# Patient Record
Sex: Female | Born: 1965 | Race: Black or African American | Hispanic: No | Smoking: Never smoker
Health system: Southern US, Community
[De-identification: ages and names within clinical notes are randomized; demographics above are authoritative.]

## PROBLEM LIST (undated history)

## (undated) DIAGNOSIS — K219 Gastro-esophageal reflux disease without esophagitis: Secondary | ICD-10-CM

## (undated) DIAGNOSIS — Z87442 Personal history of urinary calculi: Secondary | ICD-10-CM

## (undated) DIAGNOSIS — K589 Irritable bowel syndrome without diarrhea: Secondary | ICD-10-CM

## (undated) DIAGNOSIS — M1990 Unspecified osteoarthritis, unspecified site: Secondary | ICD-10-CM

## (undated) DIAGNOSIS — G4730 Sleep apnea, unspecified: Secondary | ICD-10-CM

## (undated) DIAGNOSIS — R011 Cardiac murmur, unspecified: Secondary | ICD-10-CM

## (undated) DIAGNOSIS — E669 Obesity, unspecified: Secondary | ICD-10-CM

## (undated) DIAGNOSIS — N920 Excessive and frequent menstruation with regular cycle: Secondary | ICD-10-CM

## (undated) DIAGNOSIS — N72 Inflammatory disease of cervix uteri: Secondary | ICD-10-CM

## (undated) DIAGNOSIS — E119 Type 2 diabetes mellitus without complications: Secondary | ICD-10-CM

## (undated) DIAGNOSIS — I10 Essential (primary) hypertension: Secondary | ICD-10-CM

## (undated) DIAGNOSIS — E559 Vitamin D deficiency, unspecified: Secondary | ICD-10-CM

## (undated) HISTORY — DX: Vitamin D deficiency, unspecified: E55.9

## (undated) HISTORY — PX: VAGINAL HYSTERECTOMY: SUR661

## (undated) HISTORY — DX: Obesity, unspecified: E66.9

## (undated) HISTORY — DX: Excessive and frequent menstruation with regular cycle: N92.0

## (undated) HISTORY — PX: BREAST LUMPECTOMY: SHX2

## (undated) HISTORY — PX: OTHER SURGICAL HISTORY: SHX169

## (undated) HISTORY — PX: TUBAL LIGATION: SHX77

## (undated) HISTORY — DX: Essential (primary) hypertension: I10

## (undated) HISTORY — DX: Inflammatory disease of cervix uteri: N72

## (undated) HISTORY — PX: HYSTEROSCOPY WITH D & C: SHX1775

## (undated) HISTORY — PX: POLYPECTOMY: SHX149

## (undated) HISTORY — DX: Irritable bowel syndrome, unspecified: K58.9

## (undated) HISTORY — PX: ENDOMETRIAL ABLATION: SHX621

---

## 1997-06-24 ENCOUNTER — Other Ambulatory Visit: Admission: RE | Admit: 1997-06-24 | Discharge: 1997-06-24 | Payer: Self-pay

## 1998-04-08 ENCOUNTER — Other Ambulatory Visit: Admission: RE | Admit: 1998-04-08 | Discharge: 1998-04-08 | Payer: Self-pay | Admitting: Obstetrics and Gynecology

## 1999-04-10 ENCOUNTER — Encounter: Admission: RE | Admit: 1999-04-10 | Discharge: 1999-04-25 | Payer: Self-pay

## 1999-05-26 ENCOUNTER — Other Ambulatory Visit: Admission: RE | Admit: 1999-05-26 | Discharge: 1999-05-26 | Payer: Self-pay | Admitting: Obstetrics and Gynecology

## 2000-05-27 ENCOUNTER — Other Ambulatory Visit: Admission: RE | Admit: 2000-05-27 | Discharge: 2000-05-27 | Payer: Self-pay | Admitting: Obstetrics and Gynecology

## 2001-05-30 ENCOUNTER — Other Ambulatory Visit: Admission: RE | Admit: 2001-05-30 | Discharge: 2001-05-30 | Payer: Self-pay | Admitting: Obstetrics and Gynecology

## 2001-10-21 ENCOUNTER — Encounter: Payer: Self-pay | Admitting: Family Medicine

## 2001-10-21 ENCOUNTER — Encounter: Admission: RE | Admit: 2001-10-21 | Discharge: 2001-10-21 | Payer: Self-pay | Admitting: Family Medicine

## 2002-07-02 ENCOUNTER — Other Ambulatory Visit: Admission: RE | Admit: 2002-07-02 | Discharge: 2002-07-02 | Payer: Self-pay | Admitting: Obstetrics and Gynecology

## 2002-09-23 ENCOUNTER — Ambulatory Visit (HOSPITAL_COMMUNITY): Admission: RE | Admit: 2002-09-23 | Discharge: 2002-09-23 | Payer: Self-pay | Admitting: Obstetrics and Gynecology

## 2002-09-23 ENCOUNTER — Encounter (INDEPENDENT_AMBULATORY_CARE_PROVIDER_SITE_OTHER): Payer: Self-pay | Admitting: *Deleted

## 2002-10-09 ENCOUNTER — Other Ambulatory Visit: Admission: RE | Admit: 2002-10-09 | Discharge: 2002-10-09 | Payer: Self-pay | Admitting: Family Medicine

## 2003-08-18 ENCOUNTER — Other Ambulatory Visit: Admission: RE | Admit: 2003-08-18 | Discharge: 2003-08-18 | Payer: Self-pay | Admitting: Obstetrics and Gynecology

## 2003-11-11 ENCOUNTER — Encounter: Admission: RE | Admit: 2003-11-11 | Discharge: 2003-11-11 | Payer: Self-pay | Admitting: Obstetrics and Gynecology

## 2003-11-19 ENCOUNTER — Encounter: Admission: RE | Admit: 2003-11-19 | Discharge: 2003-11-19 | Payer: Self-pay | Admitting: Obstetrics and Gynecology

## 2004-08-14 ENCOUNTER — Ambulatory Visit (HOSPITAL_COMMUNITY): Admission: RE | Admit: 2004-08-14 | Discharge: 2004-08-14 | Payer: Self-pay | Admitting: Family Medicine

## 2004-11-15 ENCOUNTER — Ambulatory Visit: Payer: Self-pay | Admitting: Cardiology

## 2005-09-11 ENCOUNTER — Ambulatory Visit: Payer: Self-pay | Admitting: Internal Medicine

## 2005-10-16 ENCOUNTER — Ambulatory Visit: Payer: Self-pay | Admitting: Internal Medicine

## 2005-10-19 ENCOUNTER — Ambulatory Visit: Payer: Self-pay | Admitting: Internal Medicine

## 2005-10-19 ENCOUNTER — Encounter (INDEPENDENT_AMBULATORY_CARE_PROVIDER_SITE_OTHER): Payer: Self-pay | Admitting: Specialist

## 2005-10-19 HISTORY — PX: COLONOSCOPY: SHX174

## 2006-02-12 ENCOUNTER — Encounter (INDEPENDENT_AMBULATORY_CARE_PROVIDER_SITE_OTHER): Payer: Self-pay | Admitting: *Deleted

## 2006-02-12 ENCOUNTER — Ambulatory Visit (HOSPITAL_COMMUNITY): Admission: RE | Admit: 2006-02-12 | Discharge: 2006-02-12 | Payer: Self-pay | Admitting: Obstetrics and Gynecology

## 2006-10-28 ENCOUNTER — Ambulatory Visit: Payer: Self-pay | Admitting: Cardiology

## 2006-10-28 LAB — CONVERTED CEMR LAB
Bilirubin, Direct: 0.1 mg/dL (ref 0.0–0.3)
CO2: 27 meq/L (ref 19–32)
Cholesterol: 154 mg/dL (ref 0–200)
GFR calc Af Amer: 120 mL/min
GFR calc non Af Amer: 99 mL/min
Glucose, Bld: 84 mg/dL (ref 70–99)
HDL: 58.8 mg/dL (ref >39.0)
LDL Cholesterol: 83 mg/dL (ref 0–99)
Potassium: 4.1 meq/L (ref 3.5–5.1)
Sodium: 142 meq/L (ref 135–145)
Total CHOL/HDL Ratio: 2.6
Total Protein: 6.5 g/dL (ref 6.0–8.3)
Triglycerides: 62 mg/dL (ref 0–149)

## 2007-11-03 ENCOUNTER — Ambulatory Visit: Payer: Self-pay | Admitting: Cardiology

## 2008-05-05 ENCOUNTER — Encounter: Admission: RE | Admit: 2008-05-05 | Discharge: 2008-05-05 | Payer: Self-pay | Admitting: Obstetrics and Gynecology

## 2008-05-30 ENCOUNTER — Encounter (INDEPENDENT_AMBULATORY_CARE_PROVIDER_SITE_OTHER): Payer: Self-pay | Admitting: *Deleted

## 2008-05-30 ENCOUNTER — Encounter: Admission: RE | Admit: 2008-05-30 | Discharge: 2008-05-30 | Payer: Self-pay | Admitting: Obstetrics and Gynecology

## 2008-11-10 ENCOUNTER — Ambulatory Visit: Payer: Self-pay | Admitting: Internal Medicine

## 2008-11-10 DIAGNOSIS — R112 Nausea with vomiting, unspecified: Secondary | ICD-10-CM | POA: Insufficient documentation

## 2008-11-10 DIAGNOSIS — K589 Irritable bowel syndrome without diarrhea: Secondary | ICD-10-CM | POA: Insufficient documentation

## 2008-11-10 LAB — CONVERTED CEMR LAB
ALT: 13 units/L (ref 0–35)
Amylase: 81 units/L (ref 27–131)
CO2: 31 meq/L (ref 19–32)
Calcium: 9 mg/dL (ref 8.4–10.5)
Chloride: 107 meq/L (ref 96–112)
Creatinine, Ser: 0.7 mg/dL (ref 0.4–1.2)
Eosinophils Relative: 1.8 % (ref 0.0–5.0)
GFR calc non Af Amer: 118.1 mL/min (ref >60)
Glucose, Bld: 102 mg/dL — ABNORMAL HIGH (ref 70–99)
HCT: 38.1 % (ref 36.0–46.0)
Hemoglobin: 13.1 g/dL (ref 12.0–15.0)
Lymphocytes Relative: 29.1 % (ref 12.0–46.0)
Lymphs Abs: 1.5 10*3/uL (ref 0.7–4.0)
Monocytes Relative: 4.9 % (ref 3.0–12.0)
Neutro Abs: 3 10*3/uL (ref 1.4–7.7)
Sodium: 142 meq/L (ref 135–145)
Total Bilirubin: 0.5 mg/dL (ref 0.3–1.2)
Total Protein: 7.3 g/dL (ref 6.0–8.3)
WBC: 5 10*3/uL (ref 4.5–10.5)

## 2008-11-26 ENCOUNTER — Telehealth: Payer: Self-pay | Admitting: Internal Medicine

## 2008-11-28 DIAGNOSIS — I10 Essential (primary) hypertension: Secondary | ICD-10-CM | POA: Insufficient documentation

## 2008-12-03 ENCOUNTER — Ambulatory Visit: Payer: Self-pay | Admitting: Cardiology

## 2008-12-07 ENCOUNTER — Telehealth: Payer: Self-pay | Admitting: Cardiology

## 2008-12-14 ENCOUNTER — Telehealth: Payer: Self-pay | Admitting: Internal Medicine

## 2008-12-16 ENCOUNTER — Ambulatory Visit: Payer: Self-pay | Admitting: Gastroenterology

## 2008-12-16 ENCOUNTER — Telehealth: Payer: Self-pay | Admitting: Internal Medicine

## 2008-12-17 ENCOUNTER — Encounter: Payer: Self-pay | Admitting: Nurse Practitioner

## 2008-12-21 ENCOUNTER — Encounter: Payer: Self-pay | Admitting: Internal Medicine

## 2008-12-21 ENCOUNTER — Ambulatory Visit: Payer: Self-pay | Admitting: Internal Medicine

## 2008-12-21 HISTORY — PX: ESOPHAGOGASTRODUODENOSCOPY: SHX1529

## 2009-12-13 ENCOUNTER — Ambulatory Visit: Payer: Self-pay | Admitting: Cardiology

## 2009-12-27 ENCOUNTER — Ambulatory Visit: Payer: Self-pay | Admitting: Internal Medicine

## 2010-02-22 ENCOUNTER — Telehealth: Payer: Self-pay | Admitting: Internal Medicine

## 2010-03-03 ENCOUNTER — Ambulatory Visit (HOSPITAL_COMMUNITY)
Admission: RE | Admit: 2010-03-03 | Discharge: 2010-03-04 | Payer: Self-pay | Source: Home / Self Care | Attending: Obstetrics & Gynecology | Admitting: Obstetrics & Gynecology

## 2010-03-03 ENCOUNTER — Encounter (INDEPENDENT_AMBULATORY_CARE_PROVIDER_SITE_OTHER): Payer: Self-pay | Admitting: Obstetrics & Gynecology

## 2010-03-24 ENCOUNTER — Ambulatory Visit: Payer: Self-pay | Admitting: Internal Medicine

## 2010-04-23 LAB — CONVERTED CEMR LAB
ALT: 13 units/L (ref 0–35)
AST: 15 units/L (ref 0–37)
Alkaline Phosphatase: 60 units/L (ref 39–117)
BUN: 10 mg/dL (ref 6–23)
Bilirubin, Direct: 0 mg/dL (ref 0.0–0.3)
Calcium: 9.1 mg/dL (ref 8.4–10.5)
Cholesterol: 178 mg/dL (ref 0–200)
GFR calc non Af Amer: 118.06 mL/min (ref >60)
Glucose, Bld: 85 mg/dL (ref 70–99)
Potassium: 3.3 meq/L — ABNORMAL LOW (ref 3.5–5.1)
Total Bilirubin: 0.7 mg/dL (ref 0.3–1.2)
Total Protein: 5.1 g/dL — ABNORMAL LOW (ref 6.0–8.3)
VLDL: 18.8 mg/dL (ref 0.0–40.0)

## 2010-04-27 NOTE — Assessment & Plan Note (Signed)
Summary: 6 WEEK FU/JMS   History of Present Illness Visit Type: Follow-up Visit Primary GI MD: Stan Head MD [facility] Primary Provider: Gilmore Laroche, MD Chief Complaint: IBS follow up  History of Present Illness:   Patient has hysterectomy 3 weeks ago and since she was on pain meds she stopped her Lotronex due to constipation she had from the pain meds. She states that the constipation is better and she is no longer taking pain meds but does have to use a laxative up to  two times a day. No significant abdominal pain at this time.  Her brother was recently diagnosed with either metastatic colon cancer or carcinoid tumor at age 27.   GI Review of Systems      Denies abdominal pain, acid reflux, belching, bloating, chest pain, dysphagia with liquids, dysphagia with solids, heartburn, loss of appetite, nausea, vomiting, vomiting blood, weight loss, and  weight gain.      Reports constipation.     Denies anal fissure, black tarry stools, change in bowel habit, diarrhea, diverticulosis, fecal incontinence, heme positive stool, hemorrhoids, irritable bowel syndrome, jaundice, light color stool, liver problems, rectal bleeding, and  rectal pain.    Current Medications (verified): 1)  Prilosec 40 Mg Cpdr (Omeprazole) .... Take 2 Tablets Once Daily 2)  Hyzaar 100-25 Mg Tabs (Losartan Potassium-Hctz) .... Once Daily 3)  Lotronex 0.5 Mg Tabs (Alosetron Hcl) .Marland Kitchen.. 1  By Mouth Two Times A Day (Stopped) 4)  Multivitamins   Tabs (Multiple Vitamin) .Marland Kitchen.. 1 Tab Once Daily 5)  Klor-Con M20 20 Meq Cr-Tabs (Potassium Chloride Crys Cr) .Marland Kitchen.. 1 Once Daily 6)  Doxycycline Hyclate 50 Mg Caps (Doxycycline Hyclate) .Marland Kitchen.. 1 Cap Once Daily To Prevent Uti's 7)  Loperamide Hcl 2 Mg Caps (Loperamide Hcl) .... Take One By Mouth As Needed 8)  Dulcolax 5 Mg Tbec (Bisacodyl) .... Take One By Mouth Two Times A Day  Allergies (verified): No Known Drug Allergies  Past History:  Past Medical History: Reviewed history from  12/16/2008 and no changes required. CORONARY ARTERY DISEASE, FAMILY HX (ICD-V17.3) HYPERTENSION, UNSPECIFIED (ICD-401.9) OBESITY (ICD-278.00) IRRITABLE BOWEL SYNDROME (ICD-564.1) Kidney Stones   Past Surgical History: Tubal Ligation Polypectomy from uterus Ablation of uterus Hysterectomy - fibroids  Family History: Family History of Diabetes: Mother History of Hypertension; mother, father No FH of Colon Cancer: Coronary Artery Disease Family History of Colon Cancer: brother age 25 colon resections  Social History: Reviewed history from 12/27/2009 and no changes required. Occupation: Emergency planning/management officer, [employer] working at home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  Alcohol Use - yes-1-2 glasses wine per week Illicit Drug Use - no Patient does not get regular exercise.   Vital Signs:  Patient profile:   45 year old female Height:      61 inches Weight:      238.4 pounds BMI:     45.21 Pulse rate:   80 / minute Pulse rhythm:   regular BP sitting:   124 / 76  (left arm) Cuff size:   regular  Vitals Entered By: Harlow Mares CMA Duncan Dull) (March 24, 2010 4:04 PM)  Physical Exam  General:  obese. NAD Abdomen:  obese and nontender   Impression & Recommendations:  Problem # 1:  IRRITABLE BOWEL SYNDROME (ICD-564.1) Assessment Deteriorated Now constipated after hyeterectomy. She appropriately stopped Lotronex which we will take off her list and if diarrhea returns she will call. To add MiraLax 1-3/day now and decrease use of Dulcolax. F/U as needed.  Problem # 2:  ?  of FAMILY HX COLON CANCER (ICD-V16.0) Assessment: New brother either has carcinoid or adenocarcinoma, it seems. She will find out and let us know as if adenocarcinoma it would change timing of repeat colonoscpy  Patient Instructions: 1)  You should take Miralax 1 capful (17 grams) dissolved in at least 8 ounces water/juice and drink daily. You may titrate the dose as needed (May take up to 3 doses in a one day  period as needed). 2)  Call our office should you begin having diarrhea again and think we may need to discuss putting you back on Lotronex. 3)  Please talk to your brother to find out if he had a carcinoid of the colon or if he had colon cancer and call back to let us know as this may change how often we need to complete colonoscopies on you in the future. 4)  The medication list was reviewed and reconciled.  All changed / newly prescribed medications were explained.  A complete medication list was provided to the patient / caregiver.

## 2010-04-27 NOTE — Medication Information (Signed)
Summary: Patient Acknowledge Form/Lotronex  Patient Acknowledge Form/Lotronex   Imported By: Lester Sanderson 12/30/2009 09:39:28  _____________________________________________________________________  External Attachment:    Type:   Image     Comment:   External Document

## 2010-04-27 NOTE — Assessment & Plan Note (Signed)
Summary: yearly f/u./cy   Visit Type:  1 yr f/u Primary Arlynn Mcdermid:  Gilmore Laroche, MD  CC:  states some discomfort in chest but thinks this is due to being "out of shape".....denies any sob or edema.  History of Present Illness: Mrs. Cones comes in today for followup of her hypertension, history of palpitations, and obesity.  She's had a good year but wakes up 10 more pounds. She has no symptoms of angina or ischemia. She's had no orthopnea PND or edema.  She's very compliant with her medications. She walks on a regular basis. She does not smoke.  Her cholesterol last was 178, HDL 61, triglycerides were normal, and LDL is 98. Her hemoglobin A1c was normal at 5.7.  She's followed by primary care with Dr. Tanya Nones    Clinical Reports Reviewed:  Nuclear Study:  11/10/1998:  Impression: Stress Cardiolite. Clinically positive and electrically negative for ischemia.  Normal perfusion with artifact aas noted above. EF was 62%.  Pricilla Riffle, MD, [facility]   Current Medications (verified): 1)  Prilosec 40 Mg Cpdr (Omeprazole) .... Take 2 Tablets Once Daily 2)  Hyzaar 100-25 Mg Tabs (Losartan Potassium-Hctz) .... Once Daily 3)  Hyoscyamine Sulfate Cr 0.375 Mg Xr12h-Tab (Hyoscyamine Sulfate) .Marland Kitchen.. 1 By Mouth Bid 4)  Multivitamins   Tabs (Multiple Vitamin) .Marland Kitchen.. 1 Tab Once Daily 5)  Klor-Con M20 20 Meq Cr-Tabs (Potassium Chloride Crys Cr) .Marland Kitchen.. 1 Once Daily 6)  Lomotil 2.5-0.025 Mg Tabs (Diphenoxylate-Atropine) .... Take 1 Tab Every 4-6 Hours As Needed For Diarrhea 7)  Doxycycline Hyclate 50 Mg Caps (Doxycycline Hyclate) .Marland Kitchen.. 1 Cap Once Daily To Prevent Uti's  Allergies (verified): No Known Drug Allergies  Past History:  Past Medical History: Last updated: 12/16/2008 CORONARY ARTERY DISEASE, FAMILY HX (ICD-V17.3) HYPERTENSION, UNSPECIFIED (ICD-401.9) OBESITY (ICD-278.00) IRRITABLE BOWEL SYNDROME (ICD-564.1) Kidney Stones   Past Surgical History: Last updated: 12/16/2008 Tubal  Ligation Polypectomy from uterus Ablation of uterus  Family History: Last updated: 12/16/2008 Family History of Diabetes: Mother History of Hypertension No FH of Colon Cancer: Coronary Artery Disease  Social History: Last updated: 12/16/2008 Occupation: Emergency planning/management officer, [employer]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  Alcohol Use - yes-1-2 glasses wine per week Illicit Drug Use - no Patient does not get regular exercise.   Risk Factors: Exercise: no (12/16/2008)  Risk Factors: Smoking Status: never (11/10/2008)  Review of Systems       negative other than history of present illness  Vital Signs:  Patient profile:   45 year old female Height:      61 inches Weight:      241.8 pounds BMI:     45.85 Pulse rate:   79 / minute Pulse rhythm:   irregular BP sitting:   124 / 80  (left arm) Cuff size:   large  Vitals Entered By: Danielle Rankin, CMA (December 13, 2009 4:38 PM)  Physical Exam  General:  obese.  obese.   Head:  normocephalic and atraumatic Eyes:  glasses, otherwise normal Neck:  Neck supple, no JVD. No masses, thyromegaly or abnormal cervical nodes. Lungs:  Clear bilaterally to auscultation and percussion. Heart:  PMI poorly appreciated, soft S1-S2, no murmur or gallop, carotid upstrokes equal bilaterally without bruits Msk:  Back normal, normal gait. Muscle strength and tone normal. Pulses:  pulses normal in all 4 extremities Extremities:  No clubbing or cyanosis. Neurologic:  Alert and oriented x 3. Skin:  Intact without lesions or rashes. Psych:  Normal affect.   EKG  Procedure date:  12/13/2009  Findings:      nsinus rhythm, normal EKG  Impression & Recommendations:  Problem # 1:  HYPERTENSION, UNSPECIFIED (ICD-401.9) Assessment Improved  Her updated medication list for this problem includes:    Hyzaar 100-25 Mg Tabs (Losartan potassium-hctz) ..... Once daily  Problem # 2:  OBESITY (ICD-278.00) Assessment: Deteriorated Have advised to lose  weight to avoid comorbidities including diabetes  Problem # 3:  CORONARY ARTERY DISEASE, FAMILY HX (ICD-V17.3) Assessment: Unchanged  Orders: EKG w/ Interpretation (93000)  Patient Instructions: 1)  Your physician recommends that you schedule a follow-up appointment in: YEAR WITH DR WALL 2)  Your physician recommends that you continue on your current medications as directed. Please refer to the Current Medication list given to you today.

## 2010-04-27 NOTE — Progress Notes (Signed)
Summary: Medication  Phone Note Call from Patient Call back at Home Phone [phone]   Caller: Patient Call For: Dr. Darrick Grinder Reason for Call: Talk to Nurse Summary of Call: Has some questions about his medication Initial call taken by: Karna Christmas,  February 22, 2010 12:34 PM  Follow-up for Phone Call        Pt is having hysterectomy on 12/9 and will need to bowel cleanse w/mag. citrate and an enema.  Pt would like to know if these medications are OK to use while on Lotronex. Pt is also having some gas/bloating on Lotronex and needs to know if Gas-X is OK. Please advise. Follow-up by: Francee Piccolo CMA Duncan Dull),  February 22, 2010 5:15 PM  Additional Follow-up for Phone Call Additional follow up Details #1::        these are ok to use Additional Follow-up by: Iva Boop MD, Clementeen Graham,  February 23, 2010 2:54 PM    Additional Follow-up for Phone Call Additional follow up Details #2::    notified above medications are OK to use.  Pt voices understanding. Follow-up by: Francee Piccolo CMA Duncan Dull),  February 24, 2010 8:26 AM

## 2010-04-27 NOTE — Assessment & Plan Note (Signed)
Summary: YEARLY F-UP/YF   History of Present Illness Visit Type: Follow-up Visit Primary GI MD: Stan Head MD [facility] Primary Provider: Gilmore Laroche, MD Chief Complaint: Yearly Follow up  History of Present Illness:   45 yo AA woman with IBS. Last seen about 1 year ago. Patient complains of lower abdominal pain that wakes her out of her sleep. She states that she can not eat anything green, fried or greasy or she has diarrhea.   She is better on hyoscyamine but still has post-prandial defecation, with soft stools. Urgent usually but not incontinence. She does have some nocturnal (really 4-5 AM) cramps and urge to defecate. Pain relieved with defecation.   GI Review of Systems    Reports abdominal pain.     Location of  Abdominal pain: lower abdomen.    Denies acid reflux, belching, bloating, chest pain, dysphagia with liquids, dysphagia with solids, heartburn, loss of appetite, nausea, vomiting, vomiting blood, weight loss, and  weight gain.      Reports diarrhea.     Denies anal fissure, black tarry stools, change in bowel habit, constipation, diverticulosis, fecal incontinence, heme positive stool, hemorrhoids, irritable bowel syndrome, jaundice, light color stool, liver problems, rectal bleeding, and  rectal pain.    EGD  Procedure date:  12/21/2008  Findings:      1) Abnormal mucosa in the bulb/descending duodenum. ? duodenits (biopsies are pending) NORMAL  2) Otherwise normal examination   Colonoscopy  Procedure date:  10/19/2005  Findings:      Normal including terminal ileum and biopsies of colon.  Procedures Next Due Date:    Colonoscopy: 01/2017   Current Medications (verified): 1)  Prilosec 40 Mg Cpdr (Omeprazole) .... Take 2 Tablets Once Daily 2)  Hyzaar 100-25 Mg Tabs (Losartan Potassium-Hctz) .... Once Daily 3)  Hyoscyamine Sulfate Cr 0.375 Mg Xr12h-Tab (Hyoscyamine Sulfate) .Marland Kitchen.. 1 By Mouth Bid 4)  Multivitamins   Tabs (Multiple Vitamin) .Marland Kitchen.. 1 Tab Once  Daily 5)  Klor-Con M20 20 Meq Cr-Tabs (Potassium Chloride Crys Cr) .Marland Kitchen.. 1 Once Daily 6)  Doxycycline Hyclate 50 Mg Caps (Doxycycline Hyclate) .Marland Kitchen.. 1 Cap Once Daily To Prevent Uti's 7)  Loperamide Hcl 2 Mg Caps (Loperamide Hcl) .... Take One By Mouth As Needed  Allergies (verified): No Known Drug Allergies  Past History:  Past Medical History: Reviewed history from 12/16/2008 and no changes required. CORONARY ARTERY DISEASE, FAMILY HX (ICD-V17.3) HYPERTENSION, UNSPECIFIED (ICD-401.9) OBESITY (ICD-278.00) IRRITABLE BOWEL SYNDROME (ICD-564.1) Kidney Stones   Past Surgical History: Reviewed history from 12/16/2008 and no changes required. Tubal Ligation Polypectomy from uterus Ablation of uterus  Family History: Reviewed history from 12/16/2008 and no changes required. 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  Alcohol Use - yes-1-2 glasses wine per week Illicit Drug Use - no Patient does not get regular exercise.   Review of Systems       wight increasing, aware as not as active working from home discussed walking and increasing activity  Vital Signs:  Patient profile:   45 year old female Height:      61 inches Weight:      242.2 pounds BMI:     45.93 Pulse rate:   80 / minute Pulse rhythm:   regular BP sitting:   126 / 78  (left arm) Cuff size:   regular  Vitals Entered By: Harlow Mares  CMA Duncan Dull) (December 27, 2009 4:07 PM)  Physical Exam  General:  obese.  Lungs:  Clear throughout to auscultation. Heart:  Regular rate and rhythm; no murmurs, rubs,  or bruits. Abdomen:  obese, soft and nontender BS+   Impression & Recommendations:  Problem # 1:  IRRITABLE BOWEL SYNDROME (ICD-564.1) Assessment Improved Symptoms x years with normal colon and TI and random colon bxs 2007. EGD normal  (duodenal bxs normal). Has not had infection or inflammation documented in stool. Hyoscyamine has helped but still could be better controlled Revisited Lotronex with her - she did not obtain handouts lst year. She is beter but thinks quality of life not optimal.  Explained dx Lotronex agreement reviewed and siged.  Problem # 2:  OBESITY (ICD-278.00) Assessment: Deteriorated we did not discuss today as far as changing diet but plan to address at follow-up did discuss weight gain and exercise needs  Patient Instructions: 1)  Please pick up your medications at your pharmacy. Call before you go to see if it is ready. Sometimes we have to send them other information for it to be processed so that is why you should call. 2)  Lotronex replaces your hyoscyamine. 3)  Do not take loperamide while using Lotronex unless Dr. Nira Conn it. 4)  If you develop new constipation, new, severe abdominal pain, rectal bleeding - STOP lotronex and notify Dr. Leone Payor 5)  Please schedule a follow-up appointment in 4 to 6 weeks.  6)  IBS brochure given.  7)  The medication list was reviewed and reconciled.  All changed / newly prescribed medications were explained.  A complete medication list was provided to the patient / caregiver. Prescriptions: LOTRONEX 0.5 MG TABS (ALOSETRON HCL) 1  by mouth two times a day  #60 x 1   Entered and Authorized by:   Iva Boop MD, [facility]   Signed by:   Iva Boop MD, FACG on 12/27/2009   Method used:   Electronically to        [facility]  [address] [id number] ([facility])       [address]       [place], [place]  [address]       Ph: [phone]       Fax: [phone]   RxID:   [id number]  cc Gilmore Laroche, MD

## 2010-06-05 LAB — BASIC METABOLIC PANEL
BUN: 10 mg/dL (ref 6–23)
CO2: 27 mEq/L (ref 19–32)
CO2: 28 mEq/L (ref 19–32)
Chloride: 102 mEq/L (ref 96–112)
Chloride: 102 mEq/L (ref 96–112)
Creatinine, Ser: 0.86 mg/dL (ref 0.4–1.2)
GFR calc Af Amer: >60 mL/min (ref >60)
GFR calc non Af Amer: >60 mL/min (ref >60)
Glucose, Bld: 105 mg/dL — ABNORMAL HIGH (ref 70–99)
Glucose, Bld: 97 mg/dL (ref 70–99)
Potassium: 4.5 mEq/L (ref 3.5–5.1)
Sodium: 138 mEq/L (ref 135–145)

## 2010-06-05 LAB — CBC
HCT: 39.3 % (ref 36.0–46.0)
Hemoglobin: 11.3 g/dL — ABNORMAL LOW (ref 12.0–15.0)
Hemoglobin: 13.3 g/dL (ref 12.0–15.0)
MCH: 30 pg (ref 26.0–34.0)
MCHC: 33.8 g/dL (ref 30.0–36.0)
MCHC: 33.8 g/dL (ref 30.0–36.0)
MCV: 87.4 fL (ref 78.0–100.0)
MCV: 88.8 fL (ref 78.0–100.0)
Platelets: 226 10*3/uL (ref 150–400)
RBC: 3.75 MIL/uL — ABNORMAL LOW (ref 3.87–5.11)
RDW: 14.3 % (ref 11.5–15.5)

## 2010-06-05 LAB — SURGICAL PCR SCREEN: Staphylococcus aureus: NEGATIVE

## 2010-06-07 ENCOUNTER — Encounter (HOSPITAL_BASED_OUTPATIENT_CLINIC_OR_DEPARTMENT_OTHER)
Admission: RE | Admit: 2010-06-07 | Discharge: 2010-06-07 | Disposition: A | Discharge status: Home / Self Care | Payer: 59 | Source: Ambulatory Visit | Attending: General Surgery | Admitting: General Surgery

## 2010-06-07 LAB — BASIC METABOLIC PANEL
BUN: 12 mg/dL (ref 6–23)
Calcium: 9.3 mg/dL (ref 8.4–10.5)
Creatinine, Ser: 0.71 mg/dL (ref 0.4–1.2)
GFR calc non Af Amer: >60 mL/min (ref >60)

## 2010-06-07 LAB — CBC
MCH: 28.5 pg (ref 26.0–34.0)
MCV: 85.4 fL (ref 78.0–100.0)
Platelets: 291 10*3/uL (ref 150–400)
RDW: 13.7 % (ref 11.5–15.5)

## 2010-06-07 LAB — DIFFERENTIAL
Basophils Relative: 0 % (ref 0–1)
Eosinophils Absolute: 0.1 10*3/uL (ref 0.0–0.7)
Eosinophils Relative: 2 % (ref 0–5)
Lymphs Abs: 1.7 10*3/uL (ref 0.7–4.0)
Monocytes Relative: 8 % (ref 3–12)

## 2010-06-08 ENCOUNTER — Other Ambulatory Visit: Payer: Self-pay | Admitting: General Surgery

## 2010-06-08 ENCOUNTER — Ambulatory Visit (HOSPITAL_BASED_OUTPATIENT_CLINIC_OR_DEPARTMENT_OTHER)
Admission: RE | Admit: 2010-06-08 | Discharge: 2010-06-08 | Disposition: A | Discharge status: Home / Self Care | Payer: 59 | Source: Ambulatory Visit | Attending: General Surgery | Admitting: General Surgery

## 2010-06-08 DIAGNOSIS — Q859 Phakomatosis, unspecified: Secondary | ICD-10-CM | POA: Insufficient documentation

## 2010-06-08 DIAGNOSIS — Z01818 Encounter for other preprocedural examination: Secondary | ICD-10-CM | POA: Insufficient documentation

## 2010-06-15 NOTE — Op Note (Signed)
  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  PROCEDURE:  Left breast mass wire localization biopsy.  SURGEON:  Troy Sine. Dwain Sarna, MD  ASSISTANT:  None.  ANESTHESIA:  General.  SUPERVISING ANESTHESIOLOGIST:  Janetta Hora. Frederick, M.D.  SPECIMENS:  Left breast tissue marked with short stitch superior, long stitch lateral, and double stitch deep.  Specimen to pathology.  ESTIMATED BLOOD LOSS:  Minimal.  COMPLICATIONS:  None.  DRAINS:  None.  DISPOSITION:  Recovery room in stable condition.  INDICATIONS:  This is a 45 year old female with left breast mass who underwent a mammogram in October 2011, had a finding of pseudoangiomatous stromal hyperplasia.  She was recommended a mammogram in 6 months at that point.  She returned with left breast pain as well as a vague density.  She was then referred for a surgical evaluation following that.  I then discussed with her a wire localization biopsy.  PROCEDURE:  After informed consent was obtained, the patient was first taken to the breast center where she had a wire placed.  I had the mammograms available for my review.  She was then brought to the operating room.  She had 1 mg of intravenous cefazolin administered. Sequential compression devices were placed on lower extremities prior to induction of anesthesia.  She was then placed under general anesthesia without complication.  Her left breast was prepped and draped in standard sterile surgical  fashion.  A surgical time-out was then performed.  I infiltrated 0.25% Marcaine in the lower outer quadrant of the left breast.  I made a radial incision, then used the cautery to excise the mass in the area around it.  A Faxitron mammogram was taken.  The mass was very well circumscribed and clearly palpable.  I was close to the margins on this, but this was unnecessary to take any further margin due to the diagnosis and its appearance.  I confirmed removal of the clip as well as the lesion, this was also confirmed by the radiologist. Irrigation was performed.  Hemostasis was observed.  I closed the deep breast tissue with the 2-0 Vicryl, the dermis with a 3-0 Vicryl, the skin with 4-0 Monocryl.  Steri-Strips and a sterile dressing were placed.  She was extubated in the operating room and transferred to the recovery room in stable condition.     Juanetta Gosling, MD     MCW/MEDQ  D:  06/08/2010  T:  06/09/2010  Job:  [id number]  cc:   Broadus John T. Pamalee Leyden, MD Heide Scales Yolanda Bonine, MD  Electronically Signed by Emelia Loron MD on 06/15/2010 11:15:45 AM

## 2010-08-08 NOTE — Assessment & Plan Note (Signed)
[facility]                            CARDIOLOGY OFFICE NOTE   Patricia [name], Patricia [name]                       MRN:          [id number]  DATE:11/03/2007                            DOB:          1965/11/13    Patricia [name] returns today for further management of her hypertension,  obesity, and strong family history of coronary disease.   She had been walking on a regular basis until recently when it got too  hot.  Her weight is actually down 4 pounds.  She is having no chest pain  or angina.   Her blood pressure has been on relatively good control.  Her lipids off  of any medication showed total cholesterol 154, triglycerides is 62, HDL  58.8, LDL of 83, total cholesterol with HDL ratio of 2.6.   Her current meds are,  1. Omeprazole 40 mg a day  2. Multivitamin daily.  3. Hyzaar 100/12.5 daily.  4. Iron each day.   She denies any orthopnea, PND, or peripheral edema.   PHYSICAL EXAMINATION:  VITAL SIGNS:  Blood pressure is 132/92, her pulse  is 85 and regular, and weight is 232.  HEENT:  Unchanged.  Carotids upstrokes are equal bilaterally without  bruits, no JVD.  Thyroid is not enlarged.  Trachea is midline.  LUNGS:  Clear.  HEART:  Reveals a regular rate and rhythm.  No gallop.  ABDOMEN:  Soft.  Good bowel sounds.  EXTREMITIES:  No cyanosis, clubbing, or edema.  Pulses are intact.  NEURO:  Intact.   Her EKG is normal.   I had a long talk with Patricia [name] today.  I have encouraged her to  continue to walk 3 hours per week, keep her weight in check or move it  down, she has done.  She will have annual blood work at [facility].  I do not think her lipids, however, will be much of a  problem.  She does need to watch out for type 2 diabetes, which is  probably her greatest risk.   I will plan on seeing her back in a year.     Thomas C. Daleen Squibb, MD, [facility]  Electronically Signed    TCW/MedQ  DD: 11/03/2007  DT: 11/04/2007  Job #:  [id number]   cc:   Olena Leatherwood [facility]

## 2010-08-08 NOTE — Assessment & Plan Note (Signed)
[facility]                            CARDIOLOGY OFFICE NOTE   NAME:Patricia [name], VIANNY SCHRAEDER                       MRN:          [id number]  DATE:10/28/2006                            DOB:          01-Jun-1965    Dictation Ended At This Point.     Thomas C. Daleen Squibb, MD, [facility]  Electronically Signed    TCW/MedQ  DD: 10/28/2006  DT: 10/28/2006  Job #: [id number]

## 2010-08-08 NOTE — Assessment & Plan Note (Signed)
[facility]                            CARDIOLOGY OFFICE NOTE   Chaniyah, Jahr JAMIA HOBAN                       MRN:          [id number]  DATE:10/28/2006                            DOB:          [birth year]    Ms. Simeone comes in today for further management of her multiple cardiac  risk factors of hypertension, which seems to be under fairly good  control, obesity and family history of premature coronary artery  disease.   I saw her last on November 15, 2004.   She is not walking.  She is working two jobs.  Her weight has actually  stayed about the same since last at 229.   She has some atypical chest pain that occurs intermittently and  spontaneously.  She is not having exertional angina or ischemic  symptoms.  She is due blood work.   MEDICATIONS:  1. Cozaar 100 mg a day.  2. Omeprazole 40 mg a day.  3. Multivitamin daily.   PHYSICAL EXAMINATION:  VITAL SIGNS:  Blood pressure today is 120/84,  pulse 68 and regular, weight 223.  HEENT:  Normocephalic, atraumatic.  PERRLA, extraocular movements  intact.  Sclerae clear.  Facial asymmetry normal.  NECK:  Carotid upstrokes are equal bilaterally without bruits.  No JVD,  thyroid not enlarged.  Trachea midline.  LUNGS:  Clear.  HEART:  Poorly appreciated PMI.  She has a soft S1, S2.  ABDOMEN:  Obese, organomegaly cannot be assessed.  She has bowel sounds.  EXTREMITIES:  Without edema.  Pulses are intact.  NEUROLOGIC:  Intact.   LABORATORY DATA AND X-RAY FINDINGS:  EKG normal.   ASSESSMENT/PLAN:  I have had a nice chat with Ms. Mcgirr.  I have  strongly recommended she at least try to walk 3 hours a week.  We will  check lipids, Chem 7 and LFTs today.  Therapeutic lifestyle choices were  again emphasized, particularly if she gets older.    Thomas C. Daleen Squibb, MD, [facility]  Electronically Signed   TCW/MedQ  DD: 10/28/2006  DT: 10/28/2006  Job #: [id number]   cc:   [facility]/Dr. Toma Aran

## 2010-08-11 NOTE — H&P (Signed)
NAME:  Patricia [name], CODERRE NO.:  [id number]   MEDICAL RECORD NO.:  [id number]                   PATIENT TYPE:   LOCATION:  SDC                                  FACILITY:  [facility]   PHYSICIAN:  Maxie Better, M.D.            DATE OF BIRTH:  12-Aug-1965   DATE OF ADMISSION:  DATE OF DISCHARGE:                                HISTORY & PHYSICAL   SCHEDULED PROCEDURE DATE:  September 23, 2002.   CHIEF COMPLAINT:  Heavy menses, endometrial mass.   HISTORY OF PRESENT ILLNESS:  This is a 45 year old gravida 2, para 2 married  black female with history of tubal ligation, last menstrual period was Aug 13, 2002, who is now being admitted for D and C hysteroscopic resection of  endometrial mass, secondary to menorrhagia.  The patient has had ambulatory  pain.  She complains of cycles lasting 7 days with a heavy flow.  Evaluation  included sonohysterogram that revealed a hyperechoic mass on the anterior  wall, measuring 2.1 x 6.0 cm.  It is unclear whether this is an endometrial  polyp versus submucosal fibroid.  Her ultrasound in April 14, 2002  revealed multiple uterine fibroids, normal ovaries at that time.   PAST MEDICAL HISTORY:   ALLERGIES:  NO KNOWN DRUG ALLERGIES.   MEDICATIONS:  Supplementation of calcium.   MEDICAL HISTORY:  Negative.   SURGICAL HISTORY:  1. C-section.  2. Tubal ligation.   OB HISTORY:  Cesarean section.  Tubal ligation.  Vaginal delivery.   FAMILY HISTORY:  No ovarian or colon cancer.  First cousin with breast  cancer.   SOCIAL HISTORY:  Married with 2 children, works at [employer].   REVIEW OF SYSTEMS:  Notable for some pain with deep breaths, otherwise, as  noted in the HISTORY OF PRESENT ILLNESS.   PHYSICAL EXAMINATION:  Well-developed, well-nourished black female in no  acute distress.  VITAL SIGNS:  Blood pressure 112/80.  Weight 222 lb.  Pulse 64.  HEENT:  The patient has no lesions.  Anicteric sclerae.  Pink  conjunctivae.  Oropharynx negative.  HEART: Regular rate and rhythm.  LUNGS:  Clear to auscultation.  BREASTS:  Soft, nontender, no palpable mass.  ABDOMEN:  Soft.  Nontender.  Healed transverse scar.  PELVIC EXAM:  Vulva showed no lesions.  Vagina had no discharge.  Cervix has  a round os.  Uterus is anteverted, about 10 weeks size.  Adnexa nontender.  No palpable mass.   IMPRESSION:  1. Menorrhagia.  2. Endometrial mass.  3. Uterine fibroids.   PLAN:  1. D and C.  2. Hysteroscopy.  3. Removal endometrial polyp or resection of submucosal fibroids if present.   Procedure reviewed at length with the patient.  Risks discussed including,  but no limited to, infection, bleeding, uterine perforation and its  management, fluid overload and its prevention and management, inability to  complete the procedure with the  first past with surgery, injury to  surrounding organs, structures, femoral injury.  All questions answered.  Antibiotic prophylaxis.                                                Maxie Better, M.D.    /MEDQ  D:  09/17/2002  T:  09/17/2002  Job:  [id number]

## 2010-08-11 NOTE — Op Note (Signed)
NAMEMarland Kitchen  [name], Patricia NO.:  [id number]   MEDICAL RECORD NO.:  [id number]          PATIENT TYPE:  AMB   LOCATION:  SDC                           [facility]ILITY:  [facility]   PHYSICIAN:  Maxie Better, M.D.DATE OF BIRTH:  [birth year]   DATE OF PROCEDURE:  02/12/2006  DATE OF DISCHARGE:                               OPERATIVE REPORT   PREOPERATIVE DIAGNOSIS:  Menorrhagia.   PROCEDURE:  Diagnostic hysteroscopy, dilation and curettage, endometrial  ablation with Novasure.   POSTOPERATIVE DIAGNOSIS:  Menorrhagia.   ANESTHESIA:  General, paracervical block.   SURGEON:  Maxie Better, M.D.   PROCEDURE:  Under adequate general anesthesia, the patient was placed in  the dorsal lithotomy position.  She was sterilely prepped and draped in  usual fashion.  The bladder was catheterized for small amount of urine.  Examination under anesthesia revealed anteverted, slightly bulky uterus.  No adnexal masses could be appreciated.  A bivalve speculum was placed  in the vagina.  10 mL of 1% Nesacaine was injected paracervically at 3  and 9 o'clock.  The cervix was parous.  The anterior lip of the cervix  was grasped with a single-tooth tenaculum.  The uterus was sounded to  initially 9 cm. The exocervical length was found to be 4.5 cm.  The  cervix was not dilated and a diagnostic hysteroscope was introduced into  the uterine cavity.  Both tubal ostia could be seen.  No lesions were  noted.  Slight thickening of the anterior wall of the uterus was noted.  No polypoid lesions were noted.  The hysteroscope was removed.  The  cavity was then curretted for moderate amount of tissue. Specimen  labelled endometrial curretting was sent to pathology. The cervix  internal os was dilated with a #8 dilator after resounding which  revealed a sounding length of 10 cm, a cervical of 4.5 cm giving a  cavity length of 5.5 cm. With the Novasure representative in the room,  the Novasure was placed  and applied as per the pharmaceutical direction  resulting in the cavity being ablated.  At the end of the procedure, the  Novasure was removed. The cavity width was 3.3.  The power was 100, time  was one minute and 20 seconds.  The hysteroscope was then reinserted and  charred endometrium was noted, less so of the fundal area; however, the  Novasure was felt to have been applied to the fundus.  The procedure was  then terminated by removing all instruments.  Specimen was the  endometrial curetting done initially after the hysteroscope.  Complication was none.  The patient tolerated the procedure well.  Fluid  deficit was 5 mL.  The patient was transferred to recovery in stable  condition.      Maxie Better, M.D.  Electronically Signed     [place]/MEDQ  D:  02/12/2006  T:  02/13/2006  Job:  [id number]

## 2010-08-11 NOTE — Op Note (Signed)
NAME:  Patricia [name], Patricia [name]                          ACCOUNT NO.:  [id number]   MEDICAL RECORD NO.:  [id number]                   PATIENT TYPE:  AMB   LOCATION:  SDC                                  [facility]   PHYSICIAN:  Maxie Better, M.D.            DATE OF BIRTH:  [birth year]   DATE OF PROCEDURE:  09/23/2002  DATE OF DISCHARGE:                                 OPERATIVE REPORT   PREOPERATIVE DIAGNOSIS:  Menorrhagia, endometrial mass.   POSTOPERATIVE DIAGNOSIS:  Menorrhagia, endometrial polyp.   PROCEDURE:  Diagnostic hysteroscopy, resection of endometrial polyp,  dilation and curettage.   ANESTHESIA:  General.   SURGEON:  Maxie Better, M.D.   INDICATIONS FOR PROCEDURE:  45 year old gravida 2, para 2, female with  menorrhagia who, on evaluation, was found to have endometrial mass and who  now presents for further evaluation.  The risks and benefits of the  procedure have been explained to the patient, consent was signed, and the  patient was transferred to the operating room.  The patient had had  laminaria placed on September 22, 2002, in order to facilitate her cervix for  surgery.  Therefore, she was given intravenous  antibiotics en route to the  operating room.   PROCEDURE:  Under adequate general anesthesia, the patient was placed in the  dorsal lithotomy position.  Examination under anesthesia revealed a  laminaria that was partially outside the cervical os.  The uterus was  anteverted, bulky, no adnexal masses could be appreciated.  The laminaria  was removed.  The patient was sterilely prepped and draped in the usual  fashion.  The bladder was catheterized for a small amount of urine.  The  bivalve speculum was placed in the vagina.  A single tooth tenaculum was  placed on the anterior lip of the cervix.  The cervix easily accepted the  #31 [facility] dilator.  A glycine primed resectoscope was introduced into the  uterine cavity without incident.  Thickened  endometrium was noted.  Both  tubal ostia could be seen.  Near the lower uterine segment, there was a  raised polypoid lesion anterior and posteriorly, both which was resected  using the loop.  The resectoscope was removed, the cavity was then curetted,  the resectoscope reinserted and additional resection was then performed.  When the area appeared to be leveled consistent with removal of the lesions,  the resectoscope was removed.  The cavity was then curetted.  All  instruments were removed from the vagina.  Specimen labeled endometrial  curettings with endometrial polyp was sent to pathology.  Estimated blood  loss was minimal.  Fluid deficit was 270.  Complications were none.  The  patient tolerated the procedure well and was transferred to the recovery  room in stable condition.  Maxie Better, M.D.    [place]/MEDQ  D:  09/23/2002  T:  09/23/2002  Job:  [id number]

## 2010-11-28 ENCOUNTER — Other Ambulatory Visit: Payer: Self-pay | Admitting: Cardiology

## 2011-07-13 ENCOUNTER — Other Ambulatory Visit: Payer: Self-pay | Admitting: Cardiology

## 2011-07-13 NOTE — Telephone Encounter (Signed)
..   Requested Prescriptions   Pending Prescriptions Disp Refills  . KLOR-CON M20 20 MEQ tablet [Pharmacy Med Name: KLOR-CON M20 TABLET] 30 tablet 3    Sig: TAKE 1 TABLET EVERY DAY

## 2011-08-22 ENCOUNTER — Other Ambulatory Visit: Payer: Self-pay | Admitting: Family Medicine

## 2011-08-22 DIAGNOSIS — M545 Low back pain, unspecified: Secondary | ICD-10-CM

## 2011-08-29 ENCOUNTER — Ambulatory Visit
Admission: RE | Admit: 2011-08-29 | Discharge: 2011-08-29 | Disposition: A | Discharge status: Home / Self Care | Payer: 59 | Source: Ambulatory Visit | Attending: Family Medicine | Admitting: Family Medicine

## 2011-08-29 DIAGNOSIS — M545 Low back pain, unspecified: Secondary | ICD-10-CM

## 2011-10-26 ENCOUNTER — Ambulatory Visit (INDEPENDENT_AMBULATORY_CARE_PROVIDER_SITE_OTHER): Payer: 59 | Admitting: Internal Medicine

## 2011-10-26 ENCOUNTER — Encounter: Payer: Self-pay | Admitting: Internal Medicine

## 2011-10-26 VITALS — BP 116/70 | HR 64 | Ht 62.5 in | Wt 253.5 lb

## 2011-10-26 DIAGNOSIS — E66813 Obesity, class 3: Secondary | ICD-10-CM

## 2011-10-26 DIAGNOSIS — K589 Irritable bowel syndrome without diarrhea: Secondary | ICD-10-CM

## 2011-10-26 MED ORDER — HYOSCYAMINE SULFATE ER 0.375 MG PO TB12: 0.3750 mg | ORAL_TABLET | Freq: Two times a day (BID) | ORAL | Status: DC | PRN

## 2011-10-26 NOTE — Patient Instructions (Addendum)
We have sent the following medications to your pharmacy for you to pick up at your convenience: Generic Levbid  This Rx is for IBS, give it several weeks to a month to notice a change.  If no help call us back.  If it does help we can refill it for you.  Contact [facility] at [phone] in regards to their Bariatric seminars information.  They are located at [address] , [address]   [place] [address]  Thank you for choosing me and [facility].  Iva Boop, M.D., [facility]

## 2011-10-26 NOTE — Progress Notes (Signed)
Subjective:    Patient ID: Patricia [name], female    DOB: Oct 05, 1965, 46 y.o.   MRN: [id number]  HPI This is a very pleasant 46 year old African American woman who presents with complaints of lower abdominal cramping. I know her in the past because of a history of IBS. She had diarrhea problems in IBS and responded well to Lotronex after not responding to anti-spasmodic. She then developed constipation on the Lotronex in the setting of a recent hysterectomy and narcotic use. She stopped that and has really not been on any therapy for some time. Over the past several months she's had cramps and pain in the right lower and left lower quadrant, usually for or during defecation. She moves her bowels about 3 times a day formed stools no diarrhea and no constipation. There is no rectal bleeding or hematochezia.  She's had back pain problems and MRI of the thoracic and lumbosacral spine has failed to show any significant lesions. She saw urology about lower, pain is well, CT scan was unrevealing she tells me.  No Known Allergies Current outpatient prescriptions:KLOR-CON M20 20 MEQ tablet, TAKE 1 TABLET EVERY DAY, Disp: 30 tablet, Rfl: 3;  Losartan Potassium-HCTZ (HYZAAR PO), Take 1 tablet by mouth daily., Disp: , Rfl: ;  omeprazole (PRILOSEC) 40 MG capsule, Take 40 mg by mouth 2 (two) times daily.,  Past Medical History  Diagnosis Date  . Kidney stones   . IBS (irritable bowel syndrome)   . Obesity   . Hypertension   . Chronic cervicitis   . Menorrhagia    Past Surgical History  Procedure Date  . Uterine ablation   . Polypectomy     uterus  . Tubal ligation   . Vaginal hysterectomy     fibroids  . Esophagogastroduodenoscopy 12/21/2008  . Colonoscopy 10/19/2005  . Cesarean section   . Hysteroscopy w/d&c     resection of endometrial polyp  . Breast lumpectomy     left, benign hamartoma   History   Social History  . Marital Status: Married    Spouse Name: N/A    Number of Children: 2    .     Occupational History  . QUALITY ANALYST    Social History Main Topics  . Smoking status: Never Smoker   . Smokeless tobacco: Never Used  . Alcohol Use: Yes     socially  . Drug Use: No  .       Family History  Problem Relation Age of Onset  . Hypertension Father   . Hypertension Mother   . Diabetes Mother   . Coronary artery disease Mother   . Cancer Brother     Carcinoid tumor of the appendix       Review of Systems She is very busy at work and thinks that might be causing some stress. Otherwise no significant stress.    Objective:   Physical Exam General:  NAD, obese Eyes:   anicteric Abdomen:  Obese, soft and nontender, BS+   Data Reviewed:  Previous colonoscopy, upper endoscopy and office notes in GI     Assessment & Plan:   1. IBS (irritable bowel syndrome)   Her current problems with crampy abdominal pain before and during defecation; clear of her IBS.   Start hyoscyamine 0.375 mg one to 2 times daily.   He will call back, if this is not helping. Will consider retrying Lotronex at 0.5 mg one or twice a day, she has concerns about that causing recurrent  constipation though the severe constipation she experience with that was in the setting of a hysterectomy and narcotic use for pain control after surgery. I think it would be worth a repeat trial of that, we could try a different anti-spasmodic as well.   2. Obesity, Class III, BMI 40-49.9 (morbid obesity)   She has tried multiple diets and will lose weight. She is walking and exercising. She describes herself as a stress or comfort either.  She is aware of bariatric surgery options. We discussed those against today. I recommended she attended bariatric seminar to learn more about the options. Should she have surgery, at this point she is inclined to pursue lap and surgery. I explained that there are better long-term results with the Roux-en-Y surgery but there are new her options coming as well.     I appreciate the opportunity to care for this patient.   CC: Leo Grosser, MD

## 2011-11-12 ENCOUNTER — Emergency Department (HOSPITAL_COMMUNITY)
Admission: EM | Admit: 2011-11-12 | Discharge: 2011-11-12 | Disposition: A | Discharge status: Home / Self Care | Payer: 59 | Attending: Emergency Medicine | Admitting: Emergency Medicine

## 2011-11-12 ENCOUNTER — Encounter (HOSPITAL_COMMUNITY): Payer: Self-pay | Admitting: Emergency Medicine

## 2011-11-12 DIAGNOSIS — K589 Irritable bowel syndrome without diarrhea: Secondary | ICD-10-CM | POA: Insufficient documentation

## 2011-11-12 DIAGNOSIS — E669 Obesity, unspecified: Secondary | ICD-10-CM | POA: Insufficient documentation

## 2011-11-12 DIAGNOSIS — K219 Gastro-esophageal reflux disease without esophagitis: Secondary | ICD-10-CM | POA: Insufficient documentation

## 2011-11-12 DIAGNOSIS — M25579 Pain in unspecified ankle and joints of unspecified foot: Secondary | ICD-10-CM | POA: Insufficient documentation

## 2011-11-12 HISTORY — DX: Gastro-esophageal reflux disease without esophagitis: K21.9

## 2011-11-12 LAB — CBC WITH DIFFERENTIAL/PLATELET
Basophils Relative: 0 % (ref 0–1)
HCT: 39.1 % (ref 36.0–46.0)
Hemoglobin: 13 g/dL (ref 12.0–15.0)
Lymphocytes Relative: 14 % (ref 12–46)
Lymphs Abs: 1 10*3/uL (ref 0.7–4.0)
MCHC: 33.2 g/dL (ref 30.0–36.0)
Monocytes Absolute: 0.3 10*3/uL (ref 0.1–1.0)
Monocytes Relative: 4 % (ref 3–12)
Neutro Abs: 6.3 10*3/uL (ref 1.7–7.7)
Neutrophils Relative %: 82 % — ABNORMAL HIGH (ref 43–77)
RBC: 4.59 MIL/uL (ref 3.87–5.11)
WBC: 7.7 10*3/uL (ref 4.0–10.5)

## 2011-11-12 LAB — BASIC METABOLIC PANEL
GFR calc Af Amer: >90 mL/min (ref >90)
GFR calc non Af Amer: >90 mL/min (ref >90)
Potassium: 4 mEq/L (ref 3.5–5.1)
Sodium: 138 mEq/L (ref 135–145)

## 2011-11-12 MED ORDER — KETOROLAC TROMETHAMINE 30 MG/ML IJ SOLN
30.0000 mg | Freq: Once | INTRAMUSCULAR | Status: AC
  Administered 2011-11-12: 30 mg via INTRAVENOUS
  Filled 2011-11-12: qty 1

## 2011-11-12 MED ORDER — OXYCODONE-ACETAMINOPHEN 5-325 MG PO TABS: 1.0000 | ORAL_TABLET | Freq: Four times a day (QID) | ORAL | Status: AC | PRN

## 2011-11-12 MED ORDER — IBUPROFEN 800 MG PO TABS: 800.0000 mg | ORAL_TABLET | Freq: Three times a day (TID) | ORAL | Status: AC | PRN

## 2011-11-12 MED ORDER — FENTANYL CITRATE 0.05 MG/ML IJ SOLN
50.0000 ug | Freq: Once | INTRAMUSCULAR | Status: AC
  Administered 2011-11-12: 50 ug via INTRAMUSCULAR
  Filled 2011-11-12: qty 2

## 2011-11-12 NOTE — ED Provider Notes (Signed)
History     CSN: [id number]  Arrival date & time 11/12/11  2002   First MD Initiated Contact with Patient 11/12/11 2036      Chief Complaint  Patient presents with  . Ankle Pain    (Consider location/radiation/quality/duration/timing/severity/associated sxs/prior treatment) HPI Pt reports history of intermittent ankle problems, occasionally swollen and painful, followed by podiatrist. She reports over the last several days she has had increasing pain in ankles so she went to her podiatrist who did xrays and gave cortisone injections. She reports since then her R ankle has become progressively more painful, severe aching/throbbing pain, worse with movement and unable to bear weight. Left ankle is also painful but not as severe. No injury, no fever.   Past Medical History  Diagnosis Date  . Kidney stones   . IBS (irritable bowel syndrome)   . Obesity   . Hypertension   . Chronic cervicitis   . Menorrhagia   . GERD (gastroesophageal reflux disease)     Past Surgical History  Procedure Date  . Uterine ablation   . Polypectomy     uterus  . Tubal ligation   . Vaginal hysterectomy     fibroids  . Esophagogastroduodenoscopy 12/21/2008  . Colonoscopy 10/19/2005  . Cesarean section   . Hysteroscopy w/d&c     resection of endometrial polyp  . Breast lumpectomy     left, benign hamartoma    Family History  Problem Relation Age of Onset  . Hypertension Father   . Hypertension Mother   . Diabetes Mother   . Coronary artery disease Mother   . Cancer Brother     Carcinoid tumor of the appendix    History  Substance Use Topics  . Smoking status: Never Smoker   . Smokeless tobacco: Never Used  . Alcohol Use: Yes     socially    OB History    Grav Para Term Preterm Abortions TAB SAB Ect Mult Living                  Review of Systems All other systems reviewed and are negative except as noted in HPI.   Allergies  Review of patient's allergies indicates no known  allergies.  Home Medications   Current Outpatient Rx  Name Route Sig Dispense Refill  . LOSARTAN POTASSIUM-HCTZ 100-25 MG PO TABS Oral Take 1 tablet by mouth at bedtime.    . OMEPRAZOLE 40 MG PO CPDR Oral Take 40 mg by mouth 2 (two) times daily.    Marland Kitchen POTASSIUM CHLORIDE CRYS ER 20 MEQ PO TBCR Oral Take 20 mEq by mouth daily.    Marland Kitchen PROBIOTIC DAILY PO Oral Take 1 tablet by mouth daily.      BP 143/97  Pulse 75  Temp 98.5 F (36.9 C) (Oral)  Resp 20  SpO2 98%  Physical Exam  Nursing note and vitals reviewed. Constitutional: She is oriented to person, place, and time. She appears well-developed and well-nourished.  HENT:  Head: Normocephalic and atraumatic.  Eyes: EOM are normal. Pupils are equal, round, and reactive to light.  Neck: Normal range of motion. Neck supple.  Cardiovascular: Normal rate, normal heart sounds and intact distal pulses.   Pulmonary/Chest: Effort normal and breath sounds normal.  Abdominal: Bowel sounds are normal. She exhibits no distension. There is no tenderness.  Musculoskeletal: She exhibits edema (bilateral ankles, R>L) and tenderness (bilateral ankles, R>L).  Neurological: She is alert and oriented to person, place, and time. She has normal strength.  No cranial nerve deficit or sensory deficit.  Skin: Skin is warm and dry. No rash noted.  Psychiatric: She has a normal mood and affect.    ED Course  Procedures (including critical care time)  Labs Reviewed  CBC WITH DIFFERENTIAL - Abnormal; Notable for the following:    Neutrophils Relative 82 (*)     All other components within normal limits  BASIC METABOLIC PANEL - Abnormal; Notable for the following:    Glucose, Bld 121 (*)     All other components within normal limits  URIC ACID   No results found.   No diagnosis found.    MDM  Labs unremarkable. Pain improved with toradol. Reviewed the xrays done at podiatrist office with [facility] radiologist, no concerning bony abnormalities in the  ankles. There is likely some inflammatory process but no concern for infection or gout. Advised that cortisone injections can take several days to be effective. She has ankle braces at home. Advised to wear for comfort. NSAIDs, pain meds if needed and Podiatry followup.        Charles B. Bernette Mayers, MD 11/12/11 2237

## 2011-11-12 NOTE — ED Notes (Addendum)
Pt reports right ankle pain with radiation to calf.  Pt stated she received cortisone shots in both ankles today around 1500. Pain and swelling resolved in left ankle but right ankle has gotten worse.  Pt stated she has had cortisone injections before but has not had pain like this.  Pt states she took hydrocodone with some relief.

## 2011-11-12 NOTE — ED Notes (Signed)
PT. REPORTS BILATERAL ANKLE PAIN / SWELLING FOR SEVERAL DAYS , SEEN BY PODIATRIST TODAY X-RAY DONE WHICH IS NEGATIVE R, RECEIVED "CORTIZONE SHOT ON BOTH ANKLES" ,  STATES PERSISTENT PAIN AT RIGHT ANKLE. DENIES INJURY.

## 2011-11-15 ENCOUNTER — Other Ambulatory Visit: Payer: Self-pay | Admitting: Cardiology

## 2011-11-27 ENCOUNTER — Encounter: Payer: Self-pay | Admitting: Cardiology

## 2011-11-27 ENCOUNTER — Ambulatory Visit (INDEPENDENT_AMBULATORY_CARE_PROVIDER_SITE_OTHER): Payer: 59 | Admitting: Cardiology

## 2011-11-27 VITALS — BP 132/86 | HR 83 | Ht 62.5 in | Wt 247.0 lb

## 2011-11-27 DIAGNOSIS — R609 Edema, unspecified: Secondary | ICD-10-CM

## 2011-11-27 DIAGNOSIS — R600 Localized edema: Secondary | ICD-10-CM

## 2011-11-27 DIAGNOSIS — I10 Essential (primary) hypertension: Secondary | ICD-10-CM

## 2011-11-27 DIAGNOSIS — E66813 Obesity, class 3: Secondary | ICD-10-CM

## 2011-11-27 LAB — HEMOGLOBIN A1C: Hgb A1c MFr Bld: 5.6 % (ref 4.6–6.5)

## 2011-11-27 NOTE — Progress Notes (Signed)
HPI Patricia [name] comes in today with a chief complaint of some lower extremity edema. Worse in the day. She has a history of hypertension which is been well-controlled. She is markedly overweight. She's not that active.  She denies orthopnea, PND. She's had no chest pain or increased dyspnea on exertion.  Past Medical History  Diagnosis Date  . Kidney stones   . IBS (irritable bowel syndrome)   . Obesity   . Hypertension   . Chronic cervicitis   . Menorrhagia   . GERD (gastroesophageal reflux disease)     Current Outpatient Prescriptions  Medication Sig Dispense Refill  . KLOR-CON M20 20 MEQ tablet TAKE 1 TABLET EVERY DAY  30 tablet  3  . losartan-hydrochlorothiazide (HYZAAR) 100-25 MG per tablet Take 1 tablet by mouth at bedtime.      Marland Kitchen omeprazole (PRILOSEC) 40 MG capsule Take 40 mg by mouth 2 (two) times daily.      . Probiotic Product (PROBIOTIC DAILY PO) Take 1 tablet by mouth daily.        No Known Allergies  Family History  Problem Relation Age of Onset  . Hypertension Father   . Hypertension Mother   . Diabetes Mother   . Coronary artery disease Mother   . Cancer Brother     Carcinoid tumor of the appendix    History   Social History  . Marital Status: Married    Spouse Name: N/A    Number of Children: 2  . Years of Education: N/A   Occupational History  . QUALITY ANALYST    Social History Main Topics  . Smoking status: Never Smoker   . Smokeless tobacco: Never Used  . Alcohol Use: Yes     socially  . Drug Use: No  . Sexually Active: Not on file   Other Topics Concern  . Not on file   Social History Narrative  . No narrative on file    ROS ALL NEGATIVE EXCEPT THOSE NOTED IN HPI  PE  General Appearance: well developed, well nourished in no acute distress, obese HEENT: symmetrical face, PERRLA, good dentition  Neck: no JVD, thyromegaly, or adenopathy, trachea midline Chest: symmetric without deformity Cardiac: PMI non-displaced, RRR, normal S1,  S2, no gallop or murmur Lung: clear to ausculation and percussion Vascular: all pulses full without bruits  Abdominal: nondistended, nontender, good bowel sounds, no HSM, no bruits Extremities: no cyanosis, clubbing , minimal edema, no sign of DVT, no varicosities  Skin: normal color, no rashes Neuro: alert and oriented x 3, non-focal Pysch: normal affect  EKG Normal sinus rhythm, normal EKG BMET    Component Value Date/Time   NA 138 11/12/2011 2107   K 4.0 11/12/2011 2107   CL 104 11/12/2011 2107   CO2 24 11/12/2011 2107   GLUCOSE 121* 11/12/2011 2107   BUN 11 11/12/2011 2107   CREATININE 0.54 11/12/2011 2107   CALCIUM 9.8 11/12/2011 2107   GFRNONAA >90 11/12/2011 2107   GFRAA >90 11/12/2011 2107    Lipid Panel     Component Value Date/Time   CHOL 178 12/03/2008 0000   TRIG 94.0 12/03/2008 0000   HDL 61.40 12/03/2008 0000   CHOLHDL 3 12/03/2008 0000   VLDL 18.8 12/03/2008 0000   LDLCALC 98 12/03/2008 0000    CBC    Component Value Date/Time   WBC 7.7 11/12/2011 2107   RBC 4.59 11/12/2011 2107   HGB 13.0 11/12/2011 2107   HCT 39.1 11/12/2011 2107   PLT 269  11/12/2011 2107   MCV 85.2 11/12/2011 2107   MCH 28.3 11/12/2011 2107   MCHC 33.2 11/12/2011 2107   RDW 14.5 11/12/2011 2107   LYMPHSABS 1.0 11/12/2011 2107   MONOABS 0.3 11/12/2011 2107   EOSABS 0.0 11/12/2011 2107   BASOSABS 0.0 11/12/2011 2107

## 2011-11-27 NOTE — Patient Instructions (Addendum)
Your physician wants you to follow-up in: 12 months with Dr Dorinda Hill will receive a reminder letter in the mail two months in advance. If you don't receive a letter, please call our office to schedule the follow-up appointment.   Your physician recommends that you continue on your current medications as directed. Please refer to the Current Medication list given to you today.   Work on [employer] loss   Your physician recommends that you return for lab work today

## 2011-11-27 NOTE — Assessment & Plan Note (Addendum)
Urged her to lose weight. Blood sugar was elevated on last check. Could be early diabetic. Will arrange for hemoglobin A1c today.

## 2011-11-27 NOTE — Assessment & Plan Note (Signed)
I've explained to her this is from her hypertension and her weight. I've encouraged her to lose weight, exercise on regular basis, keep her legs elevated when she sits down, and avoid sodium is much possible. I do not feel at this point she needs a stronger diuretic. Return the office in a year.

## 2011-11-27 NOTE — Assessment & Plan Note (Signed)
Pretty well controlled. No change in current medical therapy. Please see recommendations under edema.

## 2012-02-17 ENCOUNTER — Emergency Department (HOSPITAL_COMMUNITY): Payer: 59

## 2012-02-17 ENCOUNTER — Emergency Department (HOSPITAL_COMMUNITY)
Admission: EM | Admit: 2012-02-17 | Discharge: 2012-02-17 | Disposition: A | Discharge status: Home / Self Care | Payer: 59 | Attending: Emergency Medicine | Admitting: Emergency Medicine

## 2012-02-17 ENCOUNTER — Encounter (HOSPITAL_COMMUNITY): Payer: Self-pay | Admitting: Emergency Medicine

## 2012-02-17 DIAGNOSIS — R42 Dizziness and giddiness: Secondary | ICD-10-CM | POA: Insufficient documentation

## 2012-02-17 DIAGNOSIS — M549 Dorsalgia, unspecified: Secondary | ICD-10-CM | POA: Insufficient documentation

## 2012-02-17 DIAGNOSIS — R112 Nausea with vomiting, unspecified: Secondary | ICD-10-CM | POA: Insufficient documentation

## 2012-02-17 DIAGNOSIS — Z8742 Personal history of other diseases of the female genital tract: Secondary | ICD-10-CM | POA: Insufficient documentation

## 2012-02-17 DIAGNOSIS — R300 Dysuria: Secondary | ICD-10-CM | POA: Insufficient documentation

## 2012-02-17 DIAGNOSIS — E669 Obesity, unspecified: Secondary | ICD-10-CM | POA: Insufficient documentation

## 2012-02-17 DIAGNOSIS — I10 Essential (primary) hypertension: Secondary | ICD-10-CM | POA: Insufficient documentation

## 2012-02-17 DIAGNOSIS — K219 Gastro-esophageal reflux disease without esophagitis: Secondary | ICD-10-CM | POA: Insufficient documentation

## 2012-02-17 DIAGNOSIS — Z8719 Personal history of other diseases of the digestive system: Secondary | ICD-10-CM | POA: Insufficient documentation

## 2012-02-17 DIAGNOSIS — N200 Calculus of kidney: Secondary | ICD-10-CM | POA: Insufficient documentation

## 2012-02-17 DIAGNOSIS — Z79899 Other long term (current) drug therapy: Secondary | ICD-10-CM | POA: Insufficient documentation

## 2012-02-17 LAB — CBC WITH DIFFERENTIAL/PLATELET
Basophils Absolute: 0 10*3/uL (ref 0.0–0.1)
Basophils Relative: 1 % (ref 0–1)
Eosinophils Relative: 1 % (ref 0–5)
Lymphocytes Relative: 24 % (ref 12–46)
MCV: 83.5 fL (ref 78.0–100.0)
Neutro Abs: 4.4 10*3/uL (ref 1.7–7.7)
Platelets: 299 10*3/uL (ref 150–400)
RDW: 14.2 % (ref 11.5–15.5)
WBC: 6.4 10*3/uL (ref 4.0–10.5)

## 2012-02-17 LAB — COMPREHENSIVE METABOLIC PANEL
ALT: 9 U/L (ref 0–35)
AST: 16 U/L (ref 0–37)
Albumin: 4 g/dL (ref 3.5–5.2)
CO2: 24 mEq/L (ref 19–32)
Calcium: 9.9 mg/dL (ref 8.4–10.5)
GFR calc non Af Amer: >90 mL/min (ref >90)
Sodium: 137 mEq/L (ref 135–145)

## 2012-02-17 LAB — URINALYSIS, ROUTINE W REFLEX MICROSCOPIC
Bilirubin Urine: NEGATIVE
Glucose, UA: NEGATIVE mg/dL
Hgb urine dipstick: NEGATIVE
Specific Gravity, Urine: 1.03 (ref 1.005–1.030)
Urobilinogen, UA: 0.2 mg/dL (ref 0.0–1.0)
pH: 5 (ref 5.0–8.0)

## 2012-02-17 MED ORDER — MORPHINE SULFATE 4 MG/ML IJ SOLN
4.0000 mg | Freq: Once | INTRAMUSCULAR | Status: AC
  Administered 2012-02-17: 4 mg via INTRAVENOUS
  Filled 2012-02-17: qty 1

## 2012-02-17 MED ORDER — FENTANYL CITRATE 0.05 MG/ML IJ SOLN: 50.0000 ug | Freq: Once | INTRAMUSCULAR | Status: DC

## 2012-02-17 MED ORDER — SODIUM CHLORIDE 0.9 % IV BOLUS (SEPSIS): 1000.0000 mL | Freq: Once | INTRAVENOUS | Status: DC

## 2012-02-17 MED ORDER — ONDANSETRON HCL 4 MG/2ML IJ SOLN
4.0000 mg | Freq: Once | INTRAMUSCULAR | Status: AC
  Administered 2012-02-17: 4 mg via INTRAVENOUS
  Filled 2012-02-17: qty 2

## 2012-02-17 MED ORDER — ONDANSETRON HCL 4 MG PO TABS: 4.0000 mg | ORAL_TABLET | Freq: Four times a day (QID) | ORAL | Status: DC

## 2012-02-17 MED ORDER — OXYCODONE-ACETAMINOPHEN 5-325 MG PO TABS: 2.0000 | ORAL_TABLET | ORAL | Status: DC | PRN

## 2012-02-17 MED ORDER — KETOROLAC TROMETHAMINE 30 MG/ML IJ SOLN
30.0000 mg | Freq: Once | INTRAMUSCULAR | Status: AC
  Administered 2012-02-17: 30 mg via INTRAVENOUS
  Filled 2012-02-17: qty 1

## 2012-02-17 MED ORDER — IBUPROFEN 600 MG PO TABS: 600.0000 mg | ORAL_TABLET | Freq: Four times a day (QID) | ORAL | Status: DC | PRN

## 2012-02-17 NOTE — ED Provider Notes (Signed)
History     CSN: [id number]  Arrival date & time 02/17/12  4098   First MD Initiated Contact with Patient 02/17/12 [phone]      Chief Complaint  Patient presents with  . Back Pain  . Groin Pain    (Consider location/radiation/quality/duration/timing/severity/associated sxs/prior treatment) HPI Pt with acute onset R flank pain radiating to R groin. +nausea and vomiting. +dyuria without gross hematuria. No fever chills. Previous hx of renal stones.  Past Medical History  Diagnosis Date  . Kidney stones   . IBS (irritable bowel syndrome)   . Obesity   . Hypertension   . Chronic cervicitis   . Menorrhagia   . GERD (gastroesophageal reflux disease)     Past Surgical History  Procedure Date  . Uterine ablation   . Polypectomy     uterus  . Tubal ligation   . Vaginal hysterectomy     fibroids  . Esophagogastroduodenoscopy 12/21/2008  . Colonoscopy 10/19/2005  . Cesarean section   . Hysteroscopy w/d&c     resection of endometrial polyp  . Breast lumpectomy     left, benign hamartoma    Family History  Problem Relation Age of Onset  . Hypertension Father   . Hypertension Mother   . Diabetes Mother   . Coronary artery disease Mother   . Cancer Brother     Carcinoid tumor of the appendix    History  Substance Use Topics  . Smoking status: Never Smoker   . Smokeless tobacco: Never Used  . Alcohol Use: Yes     Comment: socially    OB History    Grav Para Term Preterm Abortions TAB SAB Ect Mult Living                  Review of Systems  Constitutional: Negative for fever and chills.  Respiratory: Negative for shortness of breath.   Cardiovascular: Negative for chest pain.  Gastrointestinal: Positive for nausea, vomiting and abdominal pain. Negative for diarrhea and constipation.  Genitourinary: Positive for dysuria and flank pain. Negative for hematuria and vaginal bleeding.  Musculoskeletal: Negative for myalgias and back pain.  Skin: Negative for rash and  wound.  Neurological: Positive for light-headedness. Negative for weakness and numbness.    Allergies  Review of patient's allergies indicates no known allergies.  Home Medications   Current Outpatient Rx  Name  Route  Sig  Dispense  Refill  . LOSARTAN POTASSIUM-HCTZ 100-25 MG PO TABS   Oral   Take 1 tablet by mouth at bedtime.         . OMEPRAZOLE 40 MG PO CPDR   Oral   Take 40 mg by mouth 2 (two) times daily.         . OXYCODONE-ACETAMINOPHEN 5-325 MG PO TABS   Oral   Take 1-2 tablets by mouth every 4 (four) hours as needed. For pain.         Marland Kitchen POTASSIUM CHLORIDE CRYS ER 20 MEQ PO TBCR   Oral   Take 20 mEq by mouth daily.         Marland Kitchen PROBIOTIC DAILY PO   Oral   Take 1 tablet by mouth daily.         . IBUPROFEN 600 MG PO TABS   Oral   Take 1 tablet (600 mg total) by mouth every 6 (six) hours as needed for pain.   30 tablet   0   . ONDANSETRON HCL 4 MG PO TABS   Oral  Take 1 tablet (4 mg total) by mouth every 6 (six) hours.   12 tablet   0   . OXYCODONE-ACETAMINOPHEN 5-325 MG PO TABS   Oral   Take 2 tablets by mouth every 4 (four) hours as needed for pain.   15 tablet   0     BP 140/70  Pulse 79  Temp 97.9 F (36.6 C) (Oral)  Resp 18  SpO2 100%  Physical Exam  Nursing note and vitals reviewed. Constitutional: She is oriented to person, place, and time. She appears well-developed and well-nourished. She appears distressed (writhing, walking around room).  HENT:  Head: Normocephalic and atraumatic.  Mouth/Throat: Oropharynx is clear and moist.  Eyes: EOM are normal. Pupils are equal, round, and reactive to light.  Neck: Normal range of motion. Neck supple.  Cardiovascular: Normal rate and regular rhythm.   Pulmonary/Chest: Effort normal and breath sounds normal. No respiratory distress. She has no wheezes. She has no rales. She exhibits no tenderness.  Abdominal: Soft. Bowel sounds are normal. She exhibits no distension and no mass. There is  no tenderness. There is no rebound and no guarding.  Musculoskeletal: Normal range of motion. She exhibits no edema and no tenderness.  Neurological: She is alert and oriented to person, place, and time.       5/5 motor, sensation intact  Skin: Skin is warm and dry. No rash noted. No erythema.  Psychiatric: She has a normal mood and affect. Her behavior is normal.    ED Course  Procedures (including critical care time)  Labs Reviewed  COMPREHENSIVE METABOLIC PANEL - Abnormal; Notable for the following:    Glucose, Bld 143 (*)     Total Bilirubin 0.2 (*)     All other components within normal limits  URINALYSIS, ROUTINE W REFLEX MICROSCOPIC - Abnormal; Notable for the following:    APPearance CLOUDY (*)     All other components within normal limits  CBC WITH DIFFERENTIAL   Ct Abdomen Pelvis Wo Contrast  02/17/2012  *RADIOLOGY REPORT*  Clinical Data:  Right-sided pain and a history of kidney stones.  CT ABDOMEN AND PELVIS WITHOUT CONTRAST (CT UROGRAM)  Technique: Contiguous axial images of the abdomen and pelvis without oral or intravenous contrast were obtained.  Comparison: 07/13/2011 from [facility]  Findings:  Exam is limited for evaluation of entities other than urinary tract calculi due to lack of oral or intravenous contrast.   Lung bases:  Patchy bibasilar atelectasis.  Mild cardiomegaly, without pericardial or pleural effusion.  Abdomen/pelvis:  Hepatomegaly, greater than 20 cm.  Normal uninfused appearance of the spleen, stomach, pancreas, gallbladder, biliary tract, left adrenal gland.  A right adrenal nodule measures 2.2 cm and 1.1 HU, most consistent with an adenoma.  No renal calculi.  Mild right hydroureter.  This is followed to the level of a 3 mm stone at the right bladder base on image 79/series 2.  No retroperitoneal or retrocrural adenopathy.  Scattered colonic diverticula.  Normal terminal ileum and appendix. Normal small bowel without abdominal ascites.    No pelvic  adenopathy.  Hysterectomy.  Left ovarian follicle at 2.3 cm. No significant free fluid.  Fat containing periumbilical abdominal wall hernia.  Bones/Musculoskeletal:  No acute osseous abnormality.  IMPRESSION:  1.  3 mm stone at the right bladder base, likely just exiting the ureterovesicular junction.  Secondary mild right hydroureter. 2.  Right adrenal adenoma. 3.  Hepatomegaly.   Original Report Authenticated By: Jeronimo Greaves, M.D.  1. Renal stone       MDM  Initial eval limited by pt distress. Likely reanl stone. Will treat and re-eval   Pt states she is feeling much better. Will d/c home with pain meds. Advised to return for worsening symptoms, fever or any concerns     Loren Racer, MD 02/17/12 1052

## 2012-02-17 NOTE — ED Notes (Signed)
Patient transported to CT 

## 2012-02-17 NOTE — ED Notes (Signed)
Pt woke this morning w/ low back pain, right groin pain, urinary urgency, nausea w/ emesis. Pt hx for kidney stones. Denies noticeable hematuria

## 2012-04-04 ENCOUNTER — Other Ambulatory Visit: Payer: Self-pay | Admitting: *Deleted

## 2012-04-04 MED ORDER — POTASSIUM CHLORIDE CRYS ER 20 MEQ PO TBCR: 20.0000 meq | EXTENDED_RELEASE_TABLET | Freq: Every day | ORAL | Status: DC

## 2012-04-12 ENCOUNTER — Other Ambulatory Visit: Payer: Self-pay | Admitting: Cardiology

## 2012-06-09 ENCOUNTER — Telehealth: Payer: Self-pay | Admitting: Internal Medicine

## 2012-06-09 NOTE — Telephone Encounter (Signed)
Patient with watery diarrhea since Thursday last week 3-4 times a day, and low grade temp . She reports that she has had no recent travel or sick contacts.she will come in and be seen in the am with Mike Gip PA .

## 2012-06-10 ENCOUNTER — Encounter: Payer: Self-pay | Admitting: Physician Assistant

## 2012-06-10 ENCOUNTER — Other Ambulatory Visit (INDEPENDENT_AMBULATORY_CARE_PROVIDER_SITE_OTHER): Payer: 59

## 2012-06-10 ENCOUNTER — Ambulatory Visit (INDEPENDENT_AMBULATORY_CARE_PROVIDER_SITE_OTHER): Payer: 59 | Admitting: Physician Assistant

## 2012-06-10 VITALS — BP 110/70 | HR 84 | Ht 61.5 in | Wt 255.0 lb

## 2012-06-10 DIAGNOSIS — G8929 Other chronic pain: Secondary | ICD-10-CM

## 2012-06-10 DIAGNOSIS — R197 Diarrhea, unspecified: Secondary | ICD-10-CM

## 2012-06-10 DIAGNOSIS — R300 Dysuria: Secondary | ICD-10-CM

## 2012-06-10 DIAGNOSIS — R1031 Right lower quadrant pain: Secondary | ICD-10-CM

## 2012-06-10 LAB — CBC WITH DIFFERENTIAL/PLATELET
Basophils Relative: 0.7 % (ref 0.0–3.0)
Eosinophils Absolute: 0.1 10*3/uL (ref 0.0–0.7)
Eosinophils Relative: 1.3 % (ref 0.0–5.0)
HCT: 39.5 % (ref 36.0–46.0)
Hemoglobin: 13.2 g/dL (ref 12.0–15.0)
Lymphs Abs: 2 10*3/uL (ref 0.7–4.0)
MCHC: 33.4 g/dL (ref 30.0–36.0)
MCV: 84.4 fl (ref 78.0–100.0)
Monocytes Absolute: 0.4 10*3/uL (ref 0.1–1.0)
Neutro Abs: 3.2 10*3/uL (ref 1.4–7.7)
RBC: 4.68 Mil/uL (ref 3.87–5.11)
WBC: 5.7 10*3/uL (ref 4.5–10.5)

## 2012-06-10 LAB — URINALYSIS, ROUTINE W REFLEX MICROSCOPIC
Ketones, ur: NEGATIVE
Leukocytes, UA: NEGATIVE
Specific Gravity, Urine: >=1.03 (ref 1.000–1.030)
Urine Glucose: NEGATIVE
Urobilinogen, UA: 0.2 (ref 0.0–1.0)
pH: 5.5 (ref 5.0–8.0)

## 2012-06-10 LAB — BASIC METABOLIC PANEL
BUN: 11 mg/dL (ref 6–23)
Creatinine, Ser: 0.8 mg/dL (ref 0.4–1.2)
GFR: 107.26 mL/min (ref >60.00)
Glucose, Bld: 102 mg/dL — ABNORMAL HIGH (ref 70–99)

## 2012-06-10 MED ORDER — DIPHENOXYLATE-ATROPINE 2.5-0.025 MG PO TABS: 1.0000 | ORAL_TABLET | Freq: Four times a day (QID) | ORAL | Status: DC | PRN

## 2012-06-10 MED ORDER — PHENAZOPYRIDINE HCL 200 MG PO TABS: ORAL_TABLET | ORAL | Status: DC

## 2012-06-10 NOTE — Progress Notes (Signed)
Agree with Ms. Esterwood's assessment and plan. Dennie Moltz E. Rakim Moone, MD, FACG   

## 2012-06-10 NOTE — Patient Instructions (Addendum)
Please go to the basement level to have your labs drawn , Urine culture, and stool studies. We will call you with the results. Continue the probiotic. We sent a prescription for Pyridium 200 mg, take as directed for bladder cramping, spasms and pain.

## 2012-06-10 NOTE — Progress Notes (Signed)
Subjective:    Patient ID: Patricia [name], female    DOB: April 28, 1965, 47 y.o.   MRN: [id number]  HPI Patricia [name] is a very nice 47 year old female known to Dr. Leone Payor who has a diagnosis of IBS which has been diarrhea predominant. She also has history of hypertension, ureterolithiasis and obesity. She had undergone colonoscopy in 2007 for diarrhea and this was a negative exam including random biopsies. He had taken Lotronex at 1 point which had worked well. Says over the past few years she really hasn't been having much difficulty but that her stools are always soft and she has 3-4 bowel movements per day generally after eating. She had acute onset this past Thursday, 06/05/2012 with very watery diarrhea with up to 8 bowel movements per day nausea and vomiting which resolved after the first 24 hours low-grade temp is in the 99-100 range and then onset of very low central abdominal discomfort which she describes as sharp and worse with urination. She has not had any other coworkers her family members ill, no recent antibiotics or changes in medications. He does take a probiotic daily. She  is able to eat and drink without difficulty at this time but concern because of persistence of watery    Review of Systems  Constitutional: Positive for fever and appetite change.  HENT: Negative.   Eyes: Negative.   Respiratory: Negative.   Gastrointestinal: Positive for nausea, abdominal pain and diarrhea.  Endocrine: Negative.   Genitourinary: Positive for dysuria.  Allergic/Immunologic: Negative.   Neurological: Negative.   Hematological: Negative.   Psychiatric/Behavioral: Negative.    Outpatient Prescriptions Prior to Visit  Medication Sig Dispense Refill  . losartan-hydrochlorothiazide (HYZAAR) 100-25 MG per tablet Take 1 tablet by mouth at bedtime.      Marland Kitchen omeprazole (PRILOSEC) 40 MG capsule Take 40 mg by mouth 2 (two) times daily.      . potassium chloride SA (K-DUR,KLOR-CON) 20 MEQ tablet Take 1  tablet (20 mEq total) by mouth daily.  30 tablet  11  . Probiotic Product (PROBIOTIC DAILY PO) Take 1 tablet by mouth daily.      Marland Kitchen ibuprofen (ADVIL,MOTRIN) 600 MG tablet Take 1 tablet (600 mg total) by mouth every 6 (six) hours as needed for pain.  30 tablet  0  . ondansetron (ZOFRAN) 4 MG tablet Take 1 tablet (4 mg total) by mouth every 6 (six) hours.  12 tablet  0  . oxyCODONE-acetaminophen (PERCOCET/ROXICET) 5-325 MG per tablet Take 1-2 tablets by mouth every 4 (four) hours as needed. For pain.      Marland Kitchen oxyCODONE-acetaminophen (PERCOCET/ROXICET) 5-325 MG per tablet Take 2 tablets by mouth every 4 (four) hours as needed for pain.  15 tablet  0   No facility-administered medications prior to visit.   No Known Allergies Patient Active Problem List  Diagnosis  . HYPERTENSION, UNSPECIFIED  . IRRITABLE BOWEL SYNDROME  . Obesity, Class III, BMI 40-49.9 (morbid obesity)  . Bilateral lower extremity edema   History  Substance Use Topics  . Smoking status: Never Smoker   . Smokeless tobacco: Never Used  . Alcohol Use: Yes     Comment: socially   family history includes Cancer in her brother; Coronary artery disease in her mother; Diabetes in her mother; and Hypertension in her father and mother.     Objective:   Physical Exam well-developed African American female in no acute distress, quite pleasant blood pressure 110/70 pulse 84 height 5 foot 1 weight 255. HEENT; nontraumatic normocephalic  EOMI PERRLA sclera anicteric,Neck; Supple no JVD, Cardiovascular; regular rate and rhythm with S1-S2 no murmur or gallop, Pulmonary; clear bilaterally, Abdomen; large soft mildly tender in the suprapubic area there is no guarding or rebound no palpable mass or hepatosplenomegaly bowel sounds are active, Recta exam not done, Ext; no clubbing cyanosis or edema skin warm and dry, Psych; mood and affect normal and appropriate        Assessment & Plan:  #27 47 year old female with history of IBS diarrhea  predominant presenting with 6 day history of acute illness with watery diarrhea low-grade temp self-limited nausea and vomiting and now with dysuria. Suspect she has had a viral gastroenteritis and may have a secondary UTI or cystitis.  Plan; check stool culture and stool for C. difficile PCR Continue to push oral fluids and soft low roughage diet CBC with differential, bmet, UA and culture She has Levsin at home and is encouraged to use this as needed for urgency and spasm Will refill Lomotil-A. take up to 4 daily as needed for diarrhea Stark pyridium 200 mg by mouth twice daily x5 days Await urine before starting antibiotics

## 2012-06-11 LAB — URINE CULTURE
Colony Count: NO GROWTH
Organism ID, Bacteria: NO GROWTH

## 2012-06-12 LAB — CLOSTRIDIUM DIFFICILE BY PCR: Toxigenic C. Difficile by PCR: NOT DETECTED

## 2012-06-23 ENCOUNTER — Ambulatory Visit (INDEPENDENT_AMBULATORY_CARE_PROVIDER_SITE_OTHER): Payer: 59 | Admitting: Family Medicine

## 2012-06-23 ENCOUNTER — Encounter: Payer: Self-pay | Admitting: Family Medicine

## 2012-06-23 VITALS — BP 132/80 | HR 70 | Temp 98.4°F | Resp 14 | Wt 253.0 lb

## 2012-06-23 DIAGNOSIS — R5381 Other malaise: Secondary | ICD-10-CM

## 2012-06-23 DIAGNOSIS — E049 Nontoxic goiter, unspecified: Secondary | ICD-10-CM

## 2012-06-23 DIAGNOSIS — R5383 Other fatigue: Secondary | ICD-10-CM

## 2012-06-23 DIAGNOSIS — J309 Allergic rhinitis, unspecified: Secondary | ICD-10-CM

## 2012-06-23 DIAGNOSIS — I10 Essential (primary) hypertension: Secondary | ICD-10-CM

## 2012-06-23 LAB — LIPID PANEL
HDL: 50 mg/dL (ref >39)
LDL Cholesterol: 109 mg/dL — ABNORMAL HIGH (ref 0–99)
Total CHOL/HDL Ratio: 3.6 Ratio
Triglycerides: 99 mg/dL (ref <150)
VLDL: 20 mg/dL (ref 0–40)

## 2012-06-23 LAB — TSH: TSH: 1.901 u[IU]/mL (ref 0.350–4.500)

## 2012-06-23 LAB — BASIC METABOLIC PANEL
CO2: 27 mEq/L (ref 19–32)
Calcium: 9.9 mg/dL (ref 8.4–10.5)
Creat: 0.66 mg/dL (ref 0.50–1.10)
Sodium: 138 mEq/L (ref 135–145)

## 2012-06-23 LAB — HEPATIC FUNCTION PANEL
Alkaline Phosphatase: 71 U/L (ref 39–117)
Bilirubin, Direct: 0.1 mg/dL (ref 0.0–0.3)
Indirect Bilirubin: 0.3 mg/dL (ref 0.0–0.9)
Total Protein: 6.9 g/dL (ref 6.0–8.3)

## 2012-06-23 MED ORDER — LOSARTAN POTASSIUM-HCTZ 100-25 MG PO TABS: 1.0000 | ORAL_TABLET | Freq: Every day | ORAL | Status: DC

## 2012-06-23 MED ORDER — OMEPRAZOLE 40 MG PO CPDR: 40.0000 mg | DELAYED_RELEASE_CAPSULE | Freq: Two times a day (BID) | ORAL | Status: DC

## 2012-06-23 MED ORDER — FLUTICASONE PROPIONATE 50 MCG/ACT NA SUSP: 2.0000 | Freq: Every day | NASAL | Status: DC

## 2012-06-23 NOTE — Progress Notes (Signed)
Subjective:     Patient ID: Patricia [name], female   DOB: 1966-03-07, 47 y.o.   MRN: [id number]  HPI #1 hypertension. Patient's current Hyzaar 100/25 by mouth daily. She denies any chest pain or shortness of breath.  #2 vivid dreams. She states she's been having dreams every night. This is a unusual for her. She states she also feels extremely tired in the mornings when she wakes up. She's not really she's rested after sleeping a full night. She is also waking up every morning with a headache.  #3 rhinitis. States her last month she's been having a runny nose and sinus pressure almost every day. She denies any fever sinus pain.  #4 weight loss. The patient has been adhering to a 1600-calorie a day diet. She's not been exercising. However her weight is up 6 pounds since her last office visit. Past Medical History  Diagnosis Date  . Kidney stones   . IBS (irritable bowel syndrome)   . Obesity   . Hypertension   . Chronic cervicitis   . Menorrhagia   . GERD (gastroesophageal reflux disease)    Current Outpatient Prescriptions on File Prior to Visit  Medication Sig Dispense Refill  . potassium chloride SA (K-DUR,KLOR-CON) 20 MEQ tablet Take 1 tablet (20 mEq total) by mouth daily.  30 tablet  11  . Probiotic Product (PROBIOTIC DAILY PO) Take 1 tablet by mouth daily.       No current facility-administered medications on file prior to visit.     Review of Systems Review of systems is otherwise negative    Objective:   Physical Exam  Constitutional: She appears well-developed and well-nourished.  HENT:  Head: Normocephalic.  Right Ear: External ear normal.  Left Ear: External ear normal.  Mouth/Throat: Oropharynx is clear and moist.  Eyes: Conjunctivae and EOM are normal. Pupils are equal, round, and reactive to light.  Neck: Normal range of motion. Neck supple. Thyromegaly present.  Cardiovascular: Normal rate, regular rhythm and normal heart sounds.   No murmur  heard. Pulmonary/Chest: Effort normal and breath sounds normal. She has no wheezes. She has no rales. She exhibits no tenderness.  Abdominal: Soft. Bowel sounds are normal.  Lymphadenopathy:    She has no cervical adenopathy.       Assessment:     Allergic rhinitis Possible obstructive sleep apnea Hypertension Obesity Goiter    Plan:     Blood pressure is well controlled. Continue Hyzaar 100/25 by mouth daily. Check a CMP and fasting lipid panel. Goal LDL is less than 130. For allergic rhinitis begin Flonase 2 sprays each nostril daily Regards to her goiter check a TSH. TSH is normal consider thyroid ultrasound. For her weight loss if all the above lab studies are normal will consider using phentermine. We'll also obtain a home sleep study to evaluate for sleep apnea.

## 2012-07-07 ENCOUNTER — Telehealth: Payer: Self-pay | Admitting: Family Medicine

## 2012-07-07 MED ORDER — PHENTERMINE HCL 37.5 MG PO CAPS: 37.5000 mg | ORAL_CAPSULE | ORAL | Status: DC

## 2012-07-07 MED ORDER — LOSARTAN POTASSIUM-HCTZ 100-12.5 MG PO TABS: 1.0000 | ORAL_TABLET | Freq: Every day | ORAL | Status: DC

## 2012-07-07 NOTE — Telephone Encounter (Signed)
Rx faxed to pharmacy and pt aware

## 2012-07-07 NOTE — Telephone Encounter (Signed)
New hyzaar rx sent to [facility], please fax adipex to her pharmacy.

## 2012-07-21 ENCOUNTER — Institutional Professional Consult (permissible substitution): Payer: 59 | Admitting: Neurology

## 2012-08-01 ENCOUNTER — Encounter: Payer: Self-pay | Admitting: Neurology

## 2012-08-01 ENCOUNTER — Ambulatory Visit (INDEPENDENT_AMBULATORY_CARE_PROVIDER_SITE_OTHER): Payer: 59 | Admitting: Neurology

## 2012-08-01 VITALS — BP 115/74 | HR 69 | Temp 98.5°F | Ht 62.5 in | Wt 248.0 lb

## 2012-08-01 DIAGNOSIS — R400 Somnolence: Secondary | ICD-10-CM

## 2012-08-01 DIAGNOSIS — G4710 Hypersomnia, unspecified: Secondary | ICD-10-CM

## 2012-08-01 DIAGNOSIS — R0989 Other specified symptoms and signs involving the circulatory and respiratory systems: Secondary | ICD-10-CM

## 2012-08-01 DIAGNOSIS — R0609 Other forms of dyspnea: Secondary | ICD-10-CM

## 2012-08-01 DIAGNOSIS — R0683 Snoring: Secondary | ICD-10-CM

## 2012-08-01 NOTE — Patient Instructions (Signed)
Please stop your weight loss drug 1 week prior to your sleep study.   Based on your symptoms and your exam I believe you are at risk for obstructive sleep apnea or OSA, and I think we should proceed with a sleep study to determine whether you do or do not have OSA and how severe it is. If you have more than mild OSA, I want you to consider treatment with CPAP. Please remember, the risks and ramifications of moderate to severe obstructive sleep apnea or OSA are: Cardiovascular disease, including congestive heart failure, stroke, difficult to control hypertension, arrhythmias, and even type 2 diabetes has been linked to untreated OSA. Sleep apnea causes disruption of sleep and sleep deprivation in most cases, which, in turn, can cause recurrent headaches, problems with memory, mood, concentration, focus, and vigilance. Most people with untreated sleep apnea report excessive daytime sleepiness, which can affect their ability to drive. Please do not drive if you feel sleepy.   I will see you back after the sleep study.

## 2012-08-01 NOTE — Progress Notes (Signed)
Subjective:    Patient ID: Patricia [name] is a 47 y.o. female.  HPI Huston Foley, MD, PhD [facility] [address] [address] [place], [place] [address]  Dear Dr. Tanya Nones,    I saw your patient, Patricia [name], upon your kind request in my neurologic clinic today for initial consultation of her sleep disorder, in particular concern for obstructive sleep apnea. The patient is unaccompanied today. As you know, Patricia [name] is a very pleasant 47 year old right-handed woman with an underlying medical history of irritable bowel syndrome, obesity, hypertension, and reflux disease as well as kidney stones, who has been experiencing daytime somnolence for the past several weeks or few months. She also reports nonrestorative sleep. She often wakes up with a headache first thing in the morning.  She is currently on potassium, probiotic, Hyzaar, and Prilosec and recently started taking Phentermine for Wt loss and has lost 7 lb in the first 2 weeks. She is walking 3/week.   Her typical bedtime is reported to be around 10 PM and usual wake time is around 7 AM. Sleep onset typically occurs within a few minutes. She reports feeling occasionally rested upon awakening. She wakes up on an average 1 in the middle of the night and has to go to the bathroom 1 times on a typical night. She reports occasional morning headaches. These are bifrontal and constant, and not severe. She reports excessive daytime somnolence (EDS) in the last 2 months and Her Epworth Sleepiness Score (ESS) is 11/24 today. She has not fallen asleep while driving.   She has been known to snore for the past 2 years. Snoring is reportedly marked, but no witnessed apneas are reported. The patient denies a sense of choking or strangling feeling. There is no report of nighttime reflux, with no nighttime cough experienced. The patient has not noted any RLS symptoms and is not known to kick while asleep or before falling  asleep. She is not a very restless sleeper and in the morning, the bed is quite disheveled.   She denies cataplexy, sleep paralysis, hypnagogic or hypnopompic hallucinations, or sleep attacks. She does not report any vivid dreams, nightmares, dream enactments, or parasomnias, such as sleep talking or sleep walking. The patient has had a sleep study some 2 years ago when she first started snoring, but was told, she had snoring and no OSA.  Her Past Medical History Is Significant For: Past Medical History  Diagnosis Date  . Kidney stones   . IBS (irritable bowel syndrome)   . Obesity   . Hypertension   . Chronic cervicitis   . Menorrhagia   . GERD (gastroesophageal reflux disease)     Her Past Surgical History Is Significant For: Past Surgical History  Procedure Laterality Date  . Uterine ablation    . Polypectomy      uterus  . Tubal ligation    . Vaginal hysterectomy      fibroids  . Esophagogastroduodenoscopy  12/21/2008  . Colonoscopy  10/19/2005  . Cesarean section    . Hysteroscopy w/d&c      resection of endometrial polyp  . Breast lumpectomy      left, benign hamartoma    Her Family History Is Significant For: Family History  Problem Relation Age of Onset  . Hypertension Father   . Hypertension Mother   . Diabetes Mother   . Coronary artery disease Mother   . Cancer Brother     Carcinoid tumor of the  appendix    Her Social History Is Significant For: History   Social History  . Marital Status: Married    Spouse Name: N/A    Number of Children: 2  . Years of Education: N/A   Occupational History  . QUALITY ANALYST    Social History Main Topics  . Smoking status: Never Smoker   . Smokeless tobacco: Never Used  . Alcohol Use: Yes     Comment: socially  . Drug Use: No  . Sexually Active: None   Other Topics Concern  . None   Social History Narrative  . None    Her Allergies Are:  No Known Allergies:   Her Current Medications Are:  Outpatient  Encounter Prescriptions as of 08/01/2012  Medication Sig Dispense Refill  . fluticasone (FLONASE) 50 MCG/ACT nasal spray Place 2 sprays into the nose daily.  16 g  6  . losartan-hydrochlorothiazide (HYZAAR) 100-12.5 MG per tablet Take 1 tablet by mouth daily.  90 tablet  3  . omeprazole (PRILOSEC) 40 MG capsule Take 1 capsule (40 mg total) by mouth 2 (two) times daily.  30 capsule  11  . phentermine 37.5 MG capsule Take 1 capsule (37.5 mg total) by mouth every morning.  30 capsule  2  . potassium chloride SA (K-DUR,KLOR-CON) 20 MEQ tablet Take 1 tablet (20 mEq total) by mouth daily.  30 tablet  11  . Probiotic Product (PROBIOTIC DAILY PO) Take 1 tablet by mouth daily.       No facility-administered encounter medications on file as of 08/01/2012.  :  Review of Systems  Respiratory:       Snoring  Cardiovascular:       Murmur  Neurological: Positive for weakness and numbness.  Psychiatric/Behavioral: Positive for sleep disturbance.    Objective:  Neurologic Exam  Physical Exam Physical Examination:   Filed Vitals:   08/01/12 1019  BP: 115/74  Pulse: 69  Temp: 98.5 F (36.9 C)    General Examination: The patient is a very pleasant 47 y.o. female in no acute distress. She appears well-developed and well-nourished and well groomed. She is obese.  HEENT: Normocephalic, atraumatic, pupils are equal, round and reactive to light and accommodation. Extraocular tracking is good without limitation to gaze excursion or nystagmus noted. Normal smooth pursuit is noted. Hearing is grossly intact. Tympanic membranes are clear bilaterally. Face is symmetric with normal facial animation and normal facial sensation. Speech is clear with no dysarthria noted. There is no hypophonia. There is no lip, neck/head, jaw or voice tremor. Neck is supple with full range of passive and active motion. There are no carotid bruits on auscultation. Oropharynx exam reveals: good dental hygiene and moderate airway  crowding, due to elongated uvula, large tongue, and tonsillar size are 1+ bilaterally. Mallampati is class II. Tongue protrudes centrally and palate elevates symmetrically. Neck size is 16.5 inches.   Chest: Clear to auscultation without wheezing, rhonchi or crackles noted.  Heart: S1+S2+0, regular and normal without murmurs, rubs or gallops noted.   Abdomen: Soft, non-tender and non-distended with normal bowel sounds appreciated on auscultation.  Extremities: There is no pitting edema in the distal lower extremities bilaterally.  Skin: Warm and dry without trophic changes noted. There are no varicose veins.  Musculoskeletal: exam reveals no obvious joint deformities, tenderness or joint swelling or erythema.   Neurologically:  Mental status: The patient is awake, alert and oriented in all 4 spheres. Her memory, attention, language and knowledge are appropriate. There is  no aphasia, agnosia, apraxia or anomia. Speech is clear with normal prosody and enunciation. Thought process is linear. Mood is congruent and affect is normal.  Cranial nerves are as described above under HEENT exam. In addition, shoulder shrug is normal with equal shoulder height noted. Motor exam: Normal bulk, strength and tone is noted. There is no drift, tremor or rebound. Romberg is negative. Reflexes are 2+ throughout. Toes are downgoing bilaterally. Fine motor skills are intact with normal finger taps, normal hand movements, normal rapid alternating patting, normal foot taps and normal foot agility.  Cerebellar testing shows no dysmetria or intention tremor on finger to nose testing. Heel to shin is unremarkable bilaterally. There is no truncal or gait ataxia.  Sensory exam is intact to light touch, pinprick, vibration, temperature sense in the upper and lower extremities.  Gait, station and balance are unremarkable. No veering to one side is noted. No leaning to one side is noted. Posture is age-appropriate and stance is  narrow based. No problems turning are noted. She turns en bloc. Tandem walk is unremarkable. Intact toe and heel stance is noted.               Assessment and Plan:   Assessment and Plan:  In summary, Patricia [name] is a very pleasant 47 y.o.-year old female with a history of snoring, obesity and EDS in the last 2 months. Her history and physical exam are concerning for obstructive sleep apnea. I talked her about this diagnosis, its prognosis and treatment options in detail today. We talked about the risks and ramifications of untreated moderate to severe OSA, including cardiovascular disease down the Road. I talked her about surgical and nonsurgical treatment options including the CPAP use. She is familiar with CPAP as her husband has OSA and is on CPAP. I talked her about the sleep test procedure and explained the sleep study to her. She would be willing to consider CPAP for treatment. I've asked her to stop her weight loss medication one week prior to her sleep study. She was in agreement. I will see her back after the sleep test is completed. She is advised not to drive or handle machinery if she sleepy. She demonstrated understanding and voiced agreement with the plan.  Thank you very much for allowing me to participate in the care of this nice patient. If I can be of any further assistance to you please do not hesitate to call me at [phone].  Sincerely,   Huston Foley, MD, PhD

## 2012-08-12 ENCOUNTER — Ambulatory Visit (INDEPENDENT_AMBULATORY_CARE_PROVIDER_SITE_OTHER): Payer: 59 | Admitting: Neurology

## 2012-08-12 VITALS — BP 122/76 | HR 70 | Ht 62.5 in | Wt 248.0 lb

## 2012-08-12 DIAGNOSIS — R0683 Snoring: Secondary | ICD-10-CM

## 2012-08-12 DIAGNOSIS — R400 Somnolence: Secondary | ICD-10-CM

## 2012-08-12 DIAGNOSIS — G4733 Obstructive sleep apnea (adult) (pediatric): Secondary | ICD-10-CM

## 2012-08-12 DIAGNOSIS — G4710 Hypersomnia, unspecified: Secondary | ICD-10-CM

## 2012-08-12 DIAGNOSIS — G4730 Sleep apnea, unspecified: Secondary | ICD-10-CM

## 2012-08-22 ENCOUNTER — Telehealth: Payer: Self-pay | Admitting: Neurology

## 2012-08-22 DIAGNOSIS — G4733 Obstructive sleep apnea (adult) (pediatric): Secondary | ICD-10-CM

## 2012-08-22 NOTE — Telephone Encounter (Signed)
Please call and notify the patient that the recent sleep study did confirm the diagnosis of obstructive sleep apnea and that I recommend treatment for this in the form of CPAP. This will require a repeat sleep study for proper titration and mask fitting. Please explain to patient and arrange for a CPAP titration study. I have placed an order in the chart. Thanks, Given the desaturation to 77% and in light of her daytime tiredness I think she would benefit from treatment. She can still pursue weight loss and the hope is that in the future once she has achieved significant weight loss she may not need to be treated with CPAP. For now, however, I do believe she would benefit from treatment. Please explain to patient. Please fax report to her PCP and closest encounter once she have spoken to the patient. I have put an order in for CPAP titration in anticipation. Huston Foley, MD, PhD [facility])

## 2012-08-25 ENCOUNTER — Encounter: Payer: Self-pay | Admitting: *Deleted

## 2012-08-25 DIAGNOSIS — G4710 Hypersomnia, unspecified: Secondary | ICD-10-CM | POA: Insufficient documentation

## 2012-08-25 DIAGNOSIS — G4730 Sleep apnea, unspecified: Secondary | ICD-10-CM | POA: Insufficient documentation

## 2012-08-25 DIAGNOSIS — G4733 Obstructive sleep apnea (adult) (pediatric): Secondary | ICD-10-CM | POA: Insufficient documentation

## 2012-08-25 NOTE — Telephone Encounter (Signed)
Called patient to discuss sleep study results from  08/12/12 .  Discussed findings, recommendations and follow up care.  Patient understood well and all questions were answered.    Patricia [name] asks what types of alternative therapies are available, she is quite nervous and apprehensive about using CPAP, she states she is claustrophobic and has to be sedated heavily when in the MRI.  We discussed some possible therapies available but reiterated that because of the nature of sleep apnea (which worsens in REM sleep dramatically) that CPAP should still be considered initially and attempted, then she can decide.  Patient agrees and understands that after titration study, she will be able to meet with Dr. Frances Furbish to discuss her experience and choose treatment based upon information obtained at the study.  She states she is willing to go in with an open mind but she wants [place] to be aware of her concerns up front.   Copy of report mailed to patient with follow up letter from our conversation.   Results routed to Dr. Tanya Nones today via Epic.

## 2012-09-08 ENCOUNTER — Ambulatory Visit (INDEPENDENT_AMBULATORY_CARE_PROVIDER_SITE_OTHER): Payer: 59 | Admitting: Neurology

## 2012-09-08 VITALS — BP 110/73 | HR 75 | Ht 62.5 in | Wt 240.0 lb

## 2012-09-08 DIAGNOSIS — G4761 Periodic limb movement disorder: Secondary | ICD-10-CM

## 2012-09-08 DIAGNOSIS — G4733 Obstructive sleep apnea (adult) (pediatric): Secondary | ICD-10-CM

## 2012-09-08 DIAGNOSIS — G4710 Hypersomnia, unspecified: Secondary | ICD-10-CM

## 2012-09-15 ENCOUNTER — Telehealth: Payer: Self-pay | Admitting: Neurology

## 2012-09-15 DIAGNOSIS — G4733 Obstructive sleep apnea (adult) (pediatric): Secondary | ICD-10-CM

## 2012-09-15 NOTE — Telephone Encounter (Signed)
Please call and inform patient that I have entered an order for treatment with PAP. We will arrange for a machine through a DME company and I will see the patient back in follow-up in about 6 weeks. I will be looking out for compliance data downloaded from the machine at the time of the followup appointment and discuss how it is going with PAP treatment at the time of the visit. Please also make sure, the patient has a follow-up appointment with me in 6-8 weeks from the setup date, thanks.  Huston Foley, MD, PhD [facility])

## 2012-09-22 ENCOUNTER — Telehealth: Payer: Self-pay | Admitting: Family Medicine

## 2012-09-22 NOTE — Telephone Encounter (Signed)
Was refill on 05/2012 with 11 refills

## 2012-09-23 ENCOUNTER — Telehealth: Payer: Self-pay | Admitting: Family Medicine

## 2012-09-23 ENCOUNTER — Encounter: Payer: Self-pay | Admitting: *Deleted

## 2012-09-23 MED ORDER — OMEPRAZOLE 40 MG PO CPDR: 40.0000 mg | DELAYED_RELEASE_CAPSULE | Freq: Every day | ORAL | Status: DC

## 2012-09-23 NOTE — Telephone Encounter (Signed)
Rx Refilled  

## 2012-09-23 NOTE — Telephone Encounter (Signed)
Called patient to discuss sleep study results from 09/08/2012 .  Discussed findings, recommendations and follow up care.  Patient understood well and all questions were answered.    Pt understands she will receive a call from [facility] regarding her CPAP setup next.  Copy of study will be mailed to her home with a follow up letter reminding her of the follow up appt with Dr. Frances Furbish on 11/14/2012 at 9 AM.  This letter will also reinforce need for compliance to therapy.

## 2012-09-23 NOTE — Progress Notes (Signed)
See media tab for full report  

## 2012-11-14 ENCOUNTER — Encounter: Payer: Self-pay | Admitting: Neurology

## 2012-11-14 ENCOUNTER — Ambulatory Visit (INDEPENDENT_AMBULATORY_CARE_PROVIDER_SITE_OTHER): Payer: 59 | Admitting: Neurology

## 2012-11-14 VITALS — BP 120/81 | HR 66 | Temp 97.9°F | Ht 62.5 in | Wt 248.0 lb

## 2012-11-14 DIAGNOSIS — Z9989 Dependence on other enabling machines and devices: Secondary | ICD-10-CM

## 2012-11-14 DIAGNOSIS — J310 Chronic rhinitis: Secondary | ICD-10-CM

## 2012-11-14 DIAGNOSIS — J309 Allergic rhinitis, unspecified: Secondary | ICD-10-CM

## 2012-11-14 DIAGNOSIS — G4733 Obstructive sleep apnea (adult) (pediatric): Secondary | ICD-10-CM

## 2012-11-14 DIAGNOSIS — R05 Cough: Secondary | ICD-10-CM

## 2012-11-14 DIAGNOSIS — R059 Cough, unspecified: Secondary | ICD-10-CM

## 2012-11-14 MED ORDER — FLUTICASONE PROPIONATE 50 MCG/ACT NA SUSP: 2.0000 | Freq: Every day | NASAL | Status: DC

## 2012-11-14 NOTE — Patient Instructions (Addendum)
Please continue using your CPAP regularly. While your insurance requires that you use CPAP at least 4 hours each night on 70% of the nights, I recommend, that you not skip any nights and use it throughout the night if you can. Getting used to CPAP does take time and patience and discipline. Untreated obstructive sleep apnea when it is moderate to severe can have an adverse impact on cardiovascular health and raise her risk for heart disease, arrhythmias, hypertension, congestive heart failure, stroke and diabetes. Untreated obstructive sleep apnea causes sleep disruption, nonrestorative sleep, and sleep deprivation. This can have an impact on your day to day functioning and cause daytime sleepiness and impairment of cognitive function, memory loss, mood disturbance, and problems focussing. Using CPAP regularly can improve these symptoms.  Pls start using flonase at night before bedtime. Pls use nasal saline rinse, like Neti Pot, during the day. If this does not help, we may send you to an allergy specialist.

## 2012-11-14 NOTE — Progress Notes (Signed)
Subjective:    Patient ID: Patricia [name] is a 47 y.o. female.  HPI  Interim history:   Patricia [name] is a very pleasant 47 year old right-handed woman with an underlying medical history of irritable bowel syndrome, obesity, hypertension, reflux disease as well as kidney stones, who presents for followup consultation after her sleep studies. She is unaccompanied today. I first met her on 08/01/2012, at which time she reported nonrestorative sleep, morning headaches, daytime somnolence as well as snoring. I asked her to come back for sleep study and she had a baseline sleep test on 08/12/2012 and I explained those test results to her. She had an increased percentage of stage II sleep. Sleep efficiency was reduced at 76%. She had mild to moderate snoring. She had a total of 9 obstructive apneas and 49 obstructive hypopneas, resulting in an AHI of 10.4 per hour, markedly worse in rem sleep with 43.9 events per hour. Baseline oxygen saturation was 94% and her nadir was 77% and REM sleep. Based on the degree of desaturation I asked her to come back for a CPAP titration study. She had that test on 09/08/2012 and I also explained those test results to her. Sleep efficiency was normal at 92.9%. She had a normal percentage of stage I and 2 sleep, 12.3 percent of deep sleep and an increased percentage of REM sleep at 35.2% with a reduced REM latency of 54 minutes. She had mild pruritic leg movements at 7.9 per hour but no associated arousals from this. She was titrated on CPAP using a nasal pillows mask area and she was titrated from 5-8 cm and had a residual AHI of 0 per hour on 8 cm of pressure. Her baseline oxygen saturation was 96%, her nadir was 88% with less than 1 minute below the saturation of 90% for the night. Based on those test results I prescribed CPAP for her. I also reviewed compliance data with her today, from 10/03/2012 through 11/09/2012 which is a total of 38 days during which time she used it every day  except for Wednesday. Percent used days greater than 4 hours was 82% indicating good compliance. Her average usage was 5 hours and 3 minutes for all days and her residual AHI was 0.9 per hour indicating that the treatment pressure of 8 cm of water with EPR  of 3. She reports feeling better rested with CPAP and less tired during the day. However, since starting CPAP she has had a continual nasal stuffiness and congestion and cough in the morning. She has had allergies in the past and used to use flonase in the past. She has had no residual allergy Sx for the past year at least, until she started using CPAP. She has post nasal drip when lying down. On the positive side, she has no more morning HAs.    Her Past Medical History Is Significant For: Past Medical History  Diagnosis Date  . Kidney stones   . IBS (irritable bowel syndrome)   . Obesity   . Hypertension   . Chronic cervicitis   . Menorrhagia   . GERD (gastroesophageal reflux disease)     Her Past Surgical History Is Significant For: Past Surgical History  Procedure Laterality Date  . Uterine ablation    . Polypectomy      uterus  . Tubal ligation    . Vaginal hysterectomy      fibroids  . Esophagogastroduodenoscopy  12/21/2008  . Colonoscopy  10/19/2005  . Cesarean section    .  Hysteroscopy w/d&c      resection of endometrial polyp  . Breast lumpectomy      left, benign hamartoma    Her Family History Is Significant For: Family History  Problem Relation Age of Onset  . Hypertension Father   . Hypertension Mother   . Diabetes Mother   . Coronary artery disease Mother   . Cancer Brother     Carcinoid tumor of the appendix    Her Social History Is Significant For: History   Social History  . Marital Status: Married    Spouse Name: N/A    Number of Children: 2  . Years of Education: N/A   Occupational History  . QUALITY ANALYST    Social History Main Topics  . Smoking status: Never Smoker   . Smokeless  tobacco: Never Used  . Alcohol Use: Yes     Comment: socially  . Drug Use: No  . Sexual Activity: None   Other Topics Concern  . None   Social History Narrative  . None    Her Allergies Are:  No Known Allergies:   Her Current Medications Are:  Outpatient Encounter Prescriptions as of 11/14/2012  Medication Sig Dispense Refill  . fluticasone (FLONASE) 50 MCG/ACT nasal spray Place 2 sprays into the nose daily.  16 g  6  . losartan-hydrochlorothiazide (HYZAAR) 100-12.5 MG per tablet Take 1 tablet by mouth daily.  90 tablet  3  . omeprazole (PRILOSEC) 40 MG capsule Take 1 capsule (40 mg total) by mouth daily.  30 capsule  11  . phentermine 37.5 MG capsule Take 1 capsule (37.5 mg total) by mouth every morning.  30 capsule  2  . potassium chloride SA (K-DUR,KLOR-CON) 20 MEQ tablet Take 1 tablet (20 mEq total) by mouth daily.  30 tablet  11  . Probiotic Product (PROBIOTIC DAILY PO) Take 1 tablet by mouth daily.       No facility-administered encounter medications on file as of 11/14/2012.  :  Review of Systems  Respiratory: Positive for cough.     Objective:  Neurologic Exam  Physical Exam Physical Examination:   Filed Vitals:   11/14/12 0921  BP: 120/81  Pulse: 66  Temp: 97.9 F (36.6 C)      General Examination: The patient is a very pleasant 47 y.o. female in no acute distress. She appears well-developed and well-nourished and well groomed. She is obese.  HEENT: Normocephalic, atraumatic, pupils are equal, round and reactive to light and accommodation. Extraocular tracking is good without limitation to gaze excursion or nystagmus noted. Normal smooth pursuit is noted. Hearing is grossly intact. Face is symmetric with normal facial animation and normal facial sensation. Speech is clear with no dysarthria noted. There is no hypophonia. There is no lip, neck/head, jaw or voice tremor. Neck is supple with full range of passive and active motion. There are no carotid bruits on  auscultation. Oropharynx exam reveals: good dental hygiene and moderate airway crowding, due to elongated uvula, large tongue, and tonsillar size are 1+ bilaterally. Mallampati is class II. Tongue protrudes centrally and palate elevates symmetrically. Nasal passages are mildly erythematous, but no significant mucosal bogginess is noted. Neck size is unchanged.   Chest: Clear to auscultation without wheezing, rhonchi or crackles noted.  Heart: S1+S2+0, regular and normal without murmurs, rubs or gallops noted.   Abdomen: Soft, non-tender and non-distended with normal bowel sounds appreciated on auscultation.  Extremities: There is no pitting edema in the distal lower extremities bilaterally.  Skin: Warm and dry without trophic changes noted. There are no varicose veins.  Musculoskeletal: exam reveals no obvious joint deformities, tenderness or joint swelling or erythema.   Neurologically:  Mental status: The patient is awake, alert and oriented in all 4 spheres. Her memory, attention, language and knowledge are appropriate. There is no aphasia, agnosia, apraxia or anomia. Speech is clear with normal prosody and enunciation. Thought process is linear. Mood is congruent and affect is normal.  Cranial nerves are as described above under HEENT exam. In addition, shoulder shrug is normal with equal shoulder height noted. Motor exam: Normal bulk, strength and tone is noted. There is no drift, tremor or rebound. Romberg is negative. Reflexes are 2+ throughout. Toes are downgoing bilaterally. Fine motor skills are intact with normal finger taps, normal hand movements, normal rapid alternating patting, normal foot taps and normal foot agility.  Cerebellar testing shows no dysmetria or intention tremor on finger to nose testing. There is no truncal or gait ataxia.  Sensory exam is intact to light touch, pinprick, vibration, temperature sense in the upper and lower extremities.  Gait, station and balance are  unremarkable. No veering to one side is noted. No leaning to one side is noted. Posture is age-appropriate and stance is narrow based. No problems turning are noted. She turns en bloc. Tandem walk is unremarkable. Intact toe and heel stance is noted.                Assessment and Plan:    In summary, Patricia [name] is a very pleasant 47 y.o.-year old female with a history of OSA, on CPAP treatment at a pressure of 8 cwp. Her physical exam is stable and She indicates good results with the use of CPAP, and good tolerance of the pressure and mask. However, she has had ongoing issues with nasal congestion, rhinorrhea and postnasal drip with cough since starting CPAP. I will prescribe flonase for night time use and I have asked her to start using nasal saline rinses during the day, such as the neti post system. She is familiar with that as her husband uses it. I reviewed the compliance data with the patient and congratulated her for her good compliance. She feels better particularly with less daytime tiredness and improved daytime headaches. I encouraged her to continue to use CPAP regularly to help reduce cardiovascular risk. If using Flonase and using nasal rinses during the day does not help her congestion and rhinorrhea, I would like for her to go back to seen an allergy specialist and I will make a referral in that regard. I've asked her to give me an update over the phone in a month or so.  We also talked about trying to maintaining a healthy lifestyle in general. I encouraged the patient to eat healthy, exercise daily and keep well hydrated, to keep a scheduled bedtime and wake time routine, to not skip any meals and eat healthy snacks in between meals and to have protein with every meal. I stressed the importance of regular exercise.   I answered all her questions today and the patient was in agreement with the above outlined plan. I would like to see the patient back in 6 months, sooner if the need  arises and encouraged her to call with any interim questions, concerns, problems or updates and refill requests.

## 2012-12-03 ENCOUNTER — Encounter: Payer: Self-pay | Admitting: Cardiology

## 2012-12-03 ENCOUNTER — Ambulatory Visit (INDEPENDENT_AMBULATORY_CARE_PROVIDER_SITE_OTHER): Payer: 59 | Admitting: Cardiology

## 2012-12-03 VITALS — BP 120/76 | HR 60 | Ht 61.5 in | Wt 249.8 lb

## 2012-12-03 DIAGNOSIS — G4730 Sleep apnea, unspecified: Secondary | ICD-10-CM

## 2012-12-03 DIAGNOSIS — I10 Essential (primary) hypertension: Secondary | ICD-10-CM

## 2012-12-03 NOTE — Patient Instructions (Addendum)
Your physician recommends that you schedule a follow-up appointment in: 1 year You will receive a reminder letter two months in advance reminding you to call and schedule your appointment. If you don't receive this letter, please contact our office.  Your physician recommends that you continue on your current medications as directed. Please refer to the Current Medication list given to you today.   

## 2012-12-03 NOTE — Progress Notes (Signed)
Clinical Summary Patricia [name] is a 47 y.o.female 1. HTN - checks at home once a week, typically 120s/70s - compliant w/ meds  2. Obesity - reports she lost 10 lbs recents, continue to work on diet. Walks on treadmill 3 days a week for 30 minutes. Denies any significant symptoms w/ exertion.  3. OSA - started on CPAP last month, compliant. Improved daytime energy.  4. LE edema - improved, notes sometimes occurs w/ standing on her feet a lot.    Past Medical History  Diagnosis Date  . Kidney stones   . IBS (irritable bowel syndrome)   . Obesity   . Hypertension   . Chronic cervicitis   . Menorrhagia   . GERD (gastroesophageal reflux disease)      No Known Allergies   Current Outpatient Prescriptions  Medication Sig Dispense Refill  . fluticasone (FLONASE) 50 MCG/ACT nasal spray Place 2 sprays into the nose at bedtime.  16 g  6  . losartan-hydrochlorothiazide (HYZAAR) 100-12.5 MG per tablet Take 1 tablet by mouth daily.  90 tablet  3  . omeprazole (PRILOSEC) 40 MG capsule Take 1 capsule (40 mg total) by mouth daily.  30 capsule  11  . phentermine 37.5 MG capsule Take 1 capsule (37.5 mg total) by mouth every morning.  30 capsule  2  . potassium chloride SA (K-DUR,KLOR-CON) 20 MEQ tablet Take 1 tablet (20 mEq total) by mouth daily.  30 tablet  11  . Probiotic Product (PROBIOTIC DAILY PO) Take 1 tablet by mouth daily.       No current facility-administered medications for this visit.     Past Surgical History  Procedure Laterality Date  . Uterine ablation    . Polypectomy      uterus  . Tubal ligation    . Vaginal hysterectomy      fibroids  . Esophagogastroduodenoscopy  12/21/2008  . Colonoscopy  10/19/2005  . Cesarean section    . Hysteroscopy w/d&c      resection of endometrial polyp  . Breast lumpectomy      left, benign hamartoma     No Known Allergies    Family History  Problem Relation Age of Onset  . Hypertension Father   . Hypertension Mother     . Diabetes Mother   . Coronary artery disease Mother   . Cancer Brother     Carcinoid tumor of the appendix     Social History Ms. Mehlberg reports that she has never smoked. She has never used smokeless tobacco. Ms. Deblanc reports that  drinks alcohol.Occas EtOH. 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   Review of Systems 12 point review negative other than reported in HPI   Physical Examination p 60 bp 120/76 Gen: NAD HEENT: sclera clear, pupils equal round and reactive CV: RRR, no m/r/g, no JVD, no carotid bruit Pulm: CTAB Abd: soft, NT, ND Ext: warm, on edema Neuro: A&Ox3, no focal deficits   Diagnostic Studies Pertinent labs 05/2012: K 4.7 Cr .66 AST 16 ALT 9 TC 179 TG 99 HDL 50 LDL 109 TSH 1.9  12/03/12 EKG: sinus, rate 75, normal axis, LAE, no ischemic changes  Assessment and Plan  1. HTN: at goal by clinic and home pressures, continue current therapy. Discussed befits of low Na and DASH diet.   2. Obesity: discussed further dietary changes and increasing exercise goals.  3. OSA: compliant w/ CPAP, symptoms of day time somnolence improved  4. LE edema: no current symptoms,  continue daily diuretic. Appears secondary to obesity/venous insufficiency. No evidence of renal or cardiac dysfunction at this time.   F/u 1 year  Antoine Poche, M.D., F.A.C.C.

## 2013-01-01 ENCOUNTER — Encounter: Payer: Self-pay | Admitting: Neurology

## 2013-01-02 NOTE — Progress Notes (Signed)
Quick Note:  I reviewed the patient's CPAP compliance data from 10/03/2012 to 12/29/2012, which is a total of 88 days, during which time the patient used CPAP every day except for 1 day. The average usage for all days was 5 hours and 32 minutes. The percent used days greater than 4 hours was 88 %, indicating good compliance. The residual AHI was 0.7 per hour, indicating a good treatment pressure of 8 cwp with EPR of 3. I will review this data with the patient at the next office visit, provide feedback and additional troubleshooting if need be.  Huston Foley, MD, PhD [facility] ([facility]   ______

## 2013-01-08 ENCOUNTER — Encounter: Payer: Self-pay | Admitting: Neurology

## 2013-01-15 ENCOUNTER — Encounter: Payer: Self-pay | Admitting: Neurology

## 2013-01-20 NOTE — Progress Notes (Signed)
Quick Note:  I reviewed the patient's CPAP compliance data from 11/30/2012 to 12/29/2012, which is a total of 30 days, during which time the patient used CPAP every day. The average usage for all days was 5 hours and 51 minutes. The percent used days greater than 4 hours was 90 %, indicating excellent compliance. The residual AHI was 0.5 per hour, indicating a very appropriate treatment pressure of 8 cwp with EPR of 3. I will review this data with the patient at the next office visit, provide feedback and additional troubleshooting if need be.  Huston Foley, MD, PhD [facility] ([facility]   ______

## 2013-01-21 ENCOUNTER — Encounter: Payer: Self-pay | Admitting: Neurology

## 2013-01-29 ENCOUNTER — Other Ambulatory Visit: Payer: Self-pay

## 2013-02-10 ENCOUNTER — Ambulatory Visit: Payer: 59 | Admitting: *Deleted

## 2013-02-27 ENCOUNTER — Encounter: Payer: Self-pay | Admitting: Family Medicine

## 2013-02-27 ENCOUNTER — Ambulatory Visit (INDEPENDENT_AMBULATORY_CARE_PROVIDER_SITE_OTHER): Payer: 59 | Admitting: Family Medicine

## 2013-02-27 VITALS — BP 128/78 | HR 78 | Temp 97.7°F | Resp 18 | Ht 61.5 in | Wt 252.0 lb

## 2013-02-27 DIAGNOSIS — J329 Chronic sinusitis, unspecified: Secondary | ICD-10-CM

## 2013-02-27 MED ORDER — PREDNISONE 20 MG PO TABS: ORAL_TABLET | ORAL | Status: DC

## 2013-02-27 MED ORDER — CEFDINIR 300 MG PO CAPS: 300.0000 mg | ORAL_CAPSULE | Freq: Two times a day (BID) | ORAL | Status: DC

## 2013-02-27 NOTE — Progress Notes (Signed)
Subjective:    Patient ID: Patricia [name], female    DOB: 08/15/1965, 47 y.o.   MRN: [id number]  HPI Patient's had 6 weeks of constant rhinorrhea constant postnasal drip constant sinus pressure and nonproductive cough. She reports sinus headaches. She denies any shortness of breath or dyspnea on exertion. She denies any fevers or chills. She has tried [employer] and other allergy medicines without any relief. Past Medical History  Diagnosis Date  . Kidney stones   . IBS (irritable bowel syndrome)   . Obesity   . Hypertension   . Chronic cervicitis   . Menorrhagia   . GERD (gastroesophageal reflux disease)    Past Surgical History  Procedure Laterality Date  . Uterine ablation    . Polypectomy      uterus  . Tubal ligation    . Vaginal hysterectomy      fibroids  . Esophagogastroduodenoscopy  12/21/2008  . Colonoscopy  10/19/2005  . Cesarean section    . Hysteroscopy w/d&c      resection of endometrial polyp  . Breast lumpectomy      left, benign hamartoma   Current Outpatient Prescriptions on File Prior to Visit  Medication Sig Dispense Refill  . fluticasone (FLONASE) 50 MCG/ACT nasal spray Place 2 sprays into the nose at bedtime.  16 g  6  . losartan-hydrochlorothiazide (HYZAAR) 100-12.5 MG per tablet Take 1 tablet by mouth daily.  90 tablet  3  . omeprazole (PRILOSEC) 40 MG capsule Take 1 capsule (40 mg total) by mouth daily.  30 capsule  11  . potassium chloride SA (K-DUR,KLOR-CON) 20 MEQ tablet Take 1 tablet (20 mEq total) by mouth daily.  30 tablet  11  . Probiotic Product (PROBIOTIC DAILY PO) Take 1 tablet by mouth daily.       No current facility-administered medications on file prior to visit.   No Known Allergies History   Social History  . Marital Status: Married    Spouse Name: N/A    Number of Children: 2  . Years of Education: N/A   Occupational History  . QUALITY ANALYST    Social History Main Topics  . Smoking status: Never Smoker   . Smokeless  tobacco: Never Used  . Alcohol Use: Yes     Comment: socially  . Drug Use: No  . Sexual Activity: Not on file   Other Topics Concern  . Not on file   Social History Narrative  . No narrative on file      Review of Systems  All other systems reviewed and are negative.       Objective:   Physical Exam  Vitals reviewed. HENT:  Right Ear: Tympanic membrane and ear canal normal. No decreased hearing is noted.  Left Ear: Tympanic membrane and ear canal normal.  Nose: Mucosal edema and rhinorrhea present. Right sinus exhibits no maxillary sinus tenderness and no frontal sinus tenderness. Left sinus exhibits no maxillary sinus tenderness and no frontal sinus tenderness.  Mouth/Throat: Uvula is midline, oropharynx is clear and moist and mucous membranes are normal.  Neck: Neck supple. No JVD present.  Cardiovascular: Normal rate, regular rhythm and normal heart sounds.  Exam reveals no gallop and no friction rub.   No murmur heard. Pulmonary/Chest: Effort normal and breath sounds normal. No respiratory distress. She has no wheezes. She has no rales.  Lymphadenopathy:    She has no cervical adenopathy.          Assessment & Plan:  1. Sinusitis, chronic I believe the patient suffering from chronic sinusitis. Start the patient on Omnicef 300 mg by mouth twice a day for 10 days. Start patient on a prednisone Dosepak. Recheck in 2 weeks if no better or sooner if worse. Consider CT scan of the sinuses if persistent. - cefdinir (OMNICEF) 300 MG capsule; Take 1 capsule (300 mg total) by mouth 2 (two) times daily.  Dispense: 20 capsule; Refill: 0 - predniSONE (DELTASONE) 20 MG tablet; 3 tabs poqday 1-2, 2 tabs poqday 3-4, 1 tab poqday 5-6  Dispense: 12 tablet; Refill: 0

## 2013-03-15 ENCOUNTER — Other Ambulatory Visit: Payer: Self-pay | Admitting: Family Medicine

## 2013-03-25 ENCOUNTER — Encounter: Payer: Self-pay | Admitting: Cardiology

## 2013-03-25 ENCOUNTER — Ambulatory Visit (INDEPENDENT_AMBULATORY_CARE_PROVIDER_SITE_OTHER): Payer: 59 | Admitting: Cardiology

## 2013-03-25 VITALS — BP 119/83 | HR 67 | Ht 61.0 in | Wt 254.8 lb

## 2013-03-25 DIAGNOSIS — R002 Palpitations: Secondary | ICD-10-CM

## 2013-03-25 NOTE — Progress Notes (Signed)
Clinical Summary Patricia [name] is a 47 y.o.female seen today as an add on patient for palpitations. This is a focused visit on her new symptoms.  1. Palpitations - started about 2 weeks ago, feeling of heart racing. No other associated symptoms. Lasts up 30-45 seconds, can occur multiple times a day. Most often occurs at night. - no coffee, 1 cup hot tea, no sodas, no energy drinks. Occasional wine. She notes increased stress over this time and wonders if it is related to this.  - TSH 1.9 from earlier this year   Past Medical History  Diagnosis Date  . Kidney stones   . IBS (irritable bowel syndrome)   . Obesity   . Hypertension   . Chronic cervicitis   . Menorrhagia   . GERD (gastroesophageal reflux disease)      No Known Allergies   Current Outpatient Prescriptions  Medication Sig Dispense Refill  . cefdinir (OMNICEF) 300 MG capsule Take 1 capsule (300 mg total) by mouth 2 (two) times daily.  20 capsule  0  . fluticasone (FLONASE) 50 MCG/ACT nasal spray Place 2 sprays into the nose at bedtime.  16 g  6  . losartan-hydrochlorothiazide (HYZAAR) 100-12.5 MG per tablet TAKE 1 TABLET BY MOUTH DAILY.  90 tablet  2  . omeprazole (PRILOSEC) 40 MG capsule Take 1 capsule (40 mg total) by mouth daily.  30 capsule  11  . potassium chloride SA (K-DUR,KLOR-CON) 20 MEQ tablet Take 1 tablet (20 mEq total) by mouth daily.  30 tablet  11  . predniSONE (DELTASONE) 20 MG tablet 3 tabs poqday 1-2, 2 tabs poqday 3-4, 1 tab poqday 5-6  12 tablet  0  . Probiotic Product (PROBIOTIC DAILY PO) Take 1 tablet by mouth daily.       No current facility-administered medications for this visit.     Past Surgical History  Procedure Laterality Date  . Uterine ablation    . Polypectomy      uterus  . Tubal ligation    . Vaginal hysterectomy      fibroids  . Esophagogastroduodenoscopy  12/21/2008  . Colonoscopy  10/19/2005  . Cesarean section    . Hysteroscopy w/d&c      resection of endometrial  polyp  . Breast lumpectomy      left, benign hamartoma     No Known Allergies    Family History  Problem Relation Age of Onset  . Hypertension Father   . Hypertension Mother   . Diabetes Mother   . Coronary artery disease Mother   . Cancer Brother     Carcinoid tumor of the appendix     Social History Patricia [name] reports that she has never smoked. She has never used smokeless tobacco. Patricia [name] reports that she drinks alcohol.   Review of Systems CONSTITUTIONAL: No weight loss, fever, chills, weakness or fatigue.  HEENT: Eyes: No visual loss, blurred vision, double vision or yellow sclerae.No hearing loss, sneezing, congestion, runny nose or sore throat.  SKIN: No rash or itching.  CARDIOVASCULAR: per HPI RESPIRATORY: No shortness of breath, cough or sputum.  GASTROINTESTINAL: No anorexia, nausea, vomiting or diarrhea. No abdominal pain or blood.  GENITOURINARY: No burning on urination, no polyuria NEUROLOGICAL: No headache, dizziness, syncope, paralysis, ataxia, numbness or tingling in the extremities. No change in bowel or bladder control.  MUSCULOSKELETAL: No muscle, back pain, joint pain or stiffness.  LYMPHATICS: No enlarged nodes. No history of splenectomy.  PSYCHIATRIC: No history of  depression or anxiety.  ENDOCRINOLOGIC: No reports of sweating, cold or heat intolerance. No polyuria or polydipsia.  Marland Kitchen   Physical Examination p 67 bp 119/83 Wt 254 lbs BMI 48 Gen: resting comfortably, no acute distress HEENT: no scleral icterus, pupils equal round and reactive, no palptable cervical adenopathy,  CV: RRR, no m/r/g, no JVD, no carotid bruits Resp: Clear to auscultation bilaterally GI: abdomen is soft, non-tender, non-distended, normal bowel sounds, no hepatosplenomegaly MSK: extremities are warm, no edema.  Skin: warm, no rash Neuro:  no focal deficits Psych: appropriate affect   Diagnostic Studies Pertinent labs 05/2012: K 4.7 Cr .66 AST 16 ALT 9 TC 179 TG 99  HDL 50 LDL 109 TSH 1.9   12/03/12 EKG: sinus, rate 75, normal axis, LAE, no ischemic changes  03/25/13 Clinic [facility] Sinus rhythm, normal EKG   Assessment and Plan  1. Palpitations - will obtain 7 day event monitor to further evaluate potential arrhythmia      Antoine Poche, M.D., F.A.C.C.

## 2013-03-25 NOTE — Patient Instructions (Signed)
Your physician recommends that you schedule a follow-up appointment in: 3 months.  Your physician recommends that you continue on your current medications as directed. Please refer to the Current Medication list given to you today. Your physician has recommended that you wear a 7 day event monitor. Event monitors are medical devices that record the heart's electrical activity. Doctors most often [place] these monitors to diagnose arrhythmias. Arrhythmias are problems with the speed or rhythm of the heartbeat. The monitor is a small, portable device. You can wear one while you do your normal daily activities. This is usually used to diagnose what is causing palpitations/syncope (passing out). [facility] WILL CONTACT YOU DIRECTLY ABOUT THIS MONITOR.

## 2013-04-01 DIAGNOSIS — R002 Palpitations: Secondary | ICD-10-CM

## 2013-04-02 ENCOUNTER — Encounter: Payer: Self-pay | Admitting: *Deleted

## 2013-04-02 ENCOUNTER — Encounter: Payer: 59 | Attending: Obstetrics and Gynecology | Admitting: *Deleted

## 2013-04-02 ENCOUNTER — Encounter: Payer: Self-pay | Admitting: Family Medicine

## 2013-04-02 DIAGNOSIS — R7309 Other abnormal glucose: Secondary | ICD-10-CM | POA: Insufficient documentation

## 2013-04-02 DIAGNOSIS — Z713 Dietary counseling and surveillance: Secondary | ICD-10-CM | POA: Insufficient documentation

## 2013-04-02 NOTE — Progress Notes (Signed)
Medical Nutrition Therapy:  Appt start time: 1500 end time:  1600.  Assessment:  Patient here today for elevated glucose. HgbA1c of 6.2. She reports that her and her husband have made some healthy changes for the last few months, with no fried foods or soda in order to lose weight. She also has been exercising regularly for the last month. She reports that portion control and snacking is an issue for her. She reports that her weight has not changed despite her efforts.   MEDICATIONS: Reviewed    DIETARY INTAKE:   Usual eating pattern includes 3 meals and 2 snacks per day.  24-hr recall:  B ( AM): Herbalife shake, hot tea  Snk ( AM): None  L ( PM): Sandwich (honey wheat bread, mayo, [place], cheese, lettuce, tomato), occasionally fruit/chips Snk ( PM): Nuts, Triscuits and cheese D ( PM): Meat (baked chicken, shrimp, pork), vegetable seasoned with bacon (peas, green beans, brussels sprouts, broccoli), mashed potatoes/rice Snk ( PM): None usually, popcorn Beverages: Hot tea, water 20 oz daily, red wine occasionally at night 1 glass  Usual physical activity: Walking treadmill 30 minutes 6x/week, started 1 month ago  Estimated energy needs: 1500 calories 188 g carbohydrates 94 g protein 42 g fat  Progress Towards Goal(s):  In progress.   Nutritional Diagnosis:  NB-1.1 Food and nutrition-related knowledge deficit As related to weight management and pre-diabetes.  As evidenced by patient report.    Intervention:  Nutrition counseling. We discussed strategies for weight loss, including balancing nutrients (carbs, protein, fat), portion control, healthy snacks, and exercise. We went over usual calorie intake, and discussed ways to reduce calories while maintaining similar meal patterns. We also briefly discussed basic carb counting, including foods with carbs, label reading, portion size, and meal planning.   Goals:  1. 1 pound weight loss per week. Goal weight: 215 pounds in 1 year.  2. Use  strategies discussed to reduce calories at meals (reduce cheese, reduce portion of mayo or switch to light, limit added fat at dinner) 3. Monitor portion size.  4. Choose healthy snacks (fruit, yogurt, vegetables, popcorn, reduced fat string cheese, moderate portions of nuts and cheese/crackers) 5. Continue exercising as currently.   Handouts given during visit include:  Weight loss tips  Meal plan card  Monitoring/Evaluation:  Dietary intake, exercise, A1c, and body weight in 1 month(s).

## 2013-04-10 ENCOUNTER — Telehealth: Payer: Self-pay | Admitting: Cardiology

## 2013-04-10 NOTE — Telephone Encounter (Signed)
Informed pt of normal monitor results. Pt verbalized understanding. Pt also informed to keep all follow up appointments.

## 2013-04-10 NOTE — Telephone Encounter (Signed)
Called left message for pt to return call.

## 2013-04-20 ENCOUNTER — Ambulatory Visit (INDEPENDENT_AMBULATORY_CARE_PROVIDER_SITE_OTHER): Payer: 59 | Admitting: Family Medicine

## 2013-04-20 VITALS — BP 110/70 | HR 60 | Temp 98.5°F | Resp 16 | Ht 60.0 in | Wt 258.0 lb

## 2013-04-20 DIAGNOSIS — J019 Acute sinusitis, unspecified: Secondary | ICD-10-CM

## 2013-04-20 MED ORDER — AMOXICILLIN-POT CLAVULANATE 875-125 MG PO TABS: 1.0000 | ORAL_TABLET | Freq: Two times a day (BID) | ORAL | Status: DC

## 2013-04-20 MED ORDER — FLUCONAZOLE 150 MG PO TABS: 150.0000 mg | ORAL_TABLET | Freq: Once | ORAL | Status: DC

## 2013-04-20 NOTE — Patient Instructions (Signed)
Start the augmentin  Use Afrin no more than 3 days Continue the flonase Take mucinex  F/U as previoius

## 2013-04-20 NOTE — Progress Notes (Signed)
   Subjective:    Patient ID: Patricia [name], female    DOB: 09-Feb-1966, 48 y.o.   MRN: [id number]  HPI  Patient here with head congestion cough sinus drainage for the past 2 weeks. She was seen a few weeks ago and diagnosed with sinusitis and given a prescription for [facility] as well as prednisone burst. She improved some but then her symptoms returned and worsened. She has postnasal drip which is causing a dry cough. She did try over-the-counter Zicam. She's had difficulty using her CPAP secondary to nasal congestion  Review of Systems  GEN- denies fatigue, fever, weight loss,weakness, recent illness HEENT- denies eye drainage, change in vision,+ nasal discharge, CVS- denies chest pain, palpitations RESP- denies SOB, +cough, wheeze Neuro- denies headache, dizziness, syncope, seizure activity      Objective:   Physical Exam   GEN- NAD, alert and oriented x3 HEENT- PERRL, EOMI, non injected sclera, pink conjunctiva, MMM, oropharynx mild injection, TM clear bilat no effusion,  + maxillary sinus tenderness, inflammed turbinates,  Nasal drainage  Neck- Supple, no LAD CVS- RRR, no murmur RESP-CTAB EXT- No edema Pulses- Radial 2+        Assessment & Plan:

## 2013-04-21 ENCOUNTER — Encounter: Payer: Self-pay | Admitting: Family Medicine

## 2013-04-21 DIAGNOSIS — J019 Acute sinusitis, unspecified: Secondary | ICD-10-CM | POA: Insufficient documentation

## 2013-04-21 NOTE — Assessment & Plan Note (Addendum)
Should not I cleared her previous infection I will change her to Augmentin twice a day she will continue the Flonase, and restart Mucinex I did advise topical Afrin to help her decongestant for the next 2 days

## 2013-05-02 ENCOUNTER — Other Ambulatory Visit: Payer: Self-pay | Admitting: Cardiology

## 2013-05-18 ENCOUNTER — Ambulatory Visit: Payer: 59 | Admitting: Neurology

## 2013-05-18 ENCOUNTER — Ambulatory Visit: Payer: 59 | Admitting: *Deleted

## 2013-06-08 ENCOUNTER — Encounter: Payer: 59 | Attending: Obstetrics and Gynecology | Admitting: *Deleted

## 2013-06-08 ENCOUNTER — Encounter: Payer: Self-pay | Admitting: *Deleted

## 2013-06-08 VITALS — Ht 60.0 in | Wt 254.9 lb

## 2013-06-08 DIAGNOSIS — Z713 Dietary counseling and surveillance: Secondary | ICD-10-CM | POA: Insufficient documentation

## 2013-06-08 DIAGNOSIS — R7309 Other abnormal glucose: Secondary | ICD-10-CM | POA: Insufficient documentation

## 2013-06-08 NOTE — Progress Notes (Signed)
Medical Nutrition Therapy:  Appt start time: 0830 end time:  0900.  Assessment:  Patient here today for a follow up for weight management. Her weight is unchanged from the last visit. Patient is disappointed in this, but she has made changes based on suggestions from previous visit. Her meals are generally the same, but she has decreased portion sizes of meats and starches, switched mustard for mayo on sandwiches, and has cut out gravy. She is also watching portion size of snack, only eating 2 small snacks daily. She is frustrated by her inability to lose weight. We discussed increasing physical activity intensity and/or duration and increasing activity in daily activity, as she has a sedentary job. We also discussed tracking diet online.   MEDICATIONS: See list   DIETARY INTAKE:   Usual eating pattern includes 3 meals and 2 snacks per day.  24-hr recall:  B ( AM): Herbalife shake, hot tea Snk ( AM): None  L ( PM): Sandwiches (whole wheat bread, [place], mustard), sometimes fruit Snk ( PM): Wheat thins, nuts, carrots D ( PM): Baked meat, vegetables, potatoes/rice Snk ( PM): Popcorn Beverages: Hot tea,water  Usual physical activity: Treadmill at least 20 minutes every morning (~1.5 mph).   Estimated energy needs: 1500 calories 188 g carbohydrates 94 g protein 42 g fat  Progress Towards Goal(s):  Some progress.   Nutritional Diagnosis:  [place]-3.3 Overweight/obesity As related to history of excessive energy intake.  As evidenced by BMI >30.    Intervention:  Nutrition counseling. Reinforced strategies for weight loss, including portion control and healthy snacking. We spent a lot of time discussing ways to increase energy expenditure in physical activity. Patient advised to track diet online.   Goals:  1. 1 pound weight loss per week.  2. Continue to use strategies to reduce calories at meals (reduced portions of cheese, mayo, and fat added to meals) 3. Continue to monitor portion size  and choose healthy snacks.  4. Increase intensity and/or duration of walking (i.e. Faster paced intervals, walk 30 minutes some days, walking outside) 5. Increase activity during daily activities (getting up and walking more at work, 7 minute workout app 1-2 times daily) 6. Track diet online at least 1-2 days weekly.   Monitoring/Evaluation:  Dietary intake, exercise, and body weight in 6 week(s).

## 2013-06-18 ENCOUNTER — Other Ambulatory Visit: Payer: Self-pay | Admitting: Family Medicine

## 2013-06-18 NOTE — Telephone Encounter (Signed)
Refill appropriate and filled per protocol. 

## 2013-06-19 ENCOUNTER — Ambulatory Visit: Payer: 59 | Admitting: Cardiology

## 2013-07-07 ENCOUNTER — Encounter: Payer: Self-pay | Admitting: Cardiology

## 2013-07-07 ENCOUNTER — Ambulatory Visit (INDEPENDENT_AMBULATORY_CARE_PROVIDER_SITE_OTHER): Payer: 59 | Admitting: Cardiology

## 2013-07-07 VITALS — BP 120/80 | HR 68 | Ht 61.5 in | Wt 256.0 lb

## 2013-07-07 DIAGNOSIS — R002 Palpitations: Secondary | ICD-10-CM

## 2013-07-07 NOTE — Patient Instructions (Signed)
Your physician recommends that you schedule a follow-up appointment in: as needed Your physician recommends that you continue on your current medications as directed. Please refer to the Current Medication list given to you today.   

## 2013-07-07 NOTE — Progress Notes (Signed)
Clinical Summary Ms. Hugill is a 48 y.o.female seen today for follow up of the following medical problems.   1. Palpitations  - denies any recent palpitations, states she stopped a diet pill and shortly after symptoms resolved.  - since last visit she completed a 7 day event monitor which showed no signficant arrhythmias   Past Medical History  Diagnosis Date  . Kidney stones   . IBS (irritable bowel syndrome)   . Obesity   . Hypertension   . Chronic cervicitis   . Menorrhagia   . GERD (gastroesophageal reflux disease)      No Known Allergies   Current Outpatient Prescriptions  Medication Sig Dispense Refill  . amoxicillin-clavulanate (AUGMENTIN) 875-125 MG per tablet Take 1 tablet by mouth 2 (two) times daily.  20 tablet  0  . fluconazole (DIFLUCAN) 150 MG tablet Take 1 tablet (150 mg total) by mouth once. Repeat in 3 days  2 tablet  1  . fluticasone (FLONASE) 50 MCG/ACT nasal spray Place 2 sprays into the nose at bedtime.  16 g  6  . KLOR-CON M20 20 MEQ tablet TAKE 1 TABLET EVERY DAY  30 tablet  3  . losartan-hydrochlorothiazide (HYZAAR) 100-12.5 MG per tablet TAKE 1 TABLET BY MOUTH DAILY.  90 tablet  1  . omeprazole (PRILOSEC) 40 MG capsule Take 1 capsule (40 mg total) by mouth daily.  30 capsule  11  . Probiotic Product (PROBIOTIC DAILY PO) Take 1 tablet by mouth daily.       No current facility-administered medications for this visit.     Past Surgical History  Procedure Laterality Date  . Uterine ablation    . Polypectomy      uterus  . Tubal ligation    . Vaginal hysterectomy      fibroids  . Esophagogastroduodenoscopy  12/21/2008  . Colonoscopy  10/19/2005  . Cesarean section    . Hysteroscopy w/d&c      resection of endometrial polyp  . Breast lumpectomy      left, benign hamartoma     No Known Allergies    Family History  Problem Relation Age of Onset  . Hypertension Father   . Hypertension Mother   . Diabetes Mother   . Coronary artery  disease Mother   . Cancer Brother     Carcinoid tumor of the appendix     Social History Ms. Crock reports that she has never smoked. She has never used smokeless tobacco. Ms. Iversen reports that she drinks alcohol.   Review of Systems CONSTITUTIONAL: No weight loss, fever, chills, weakness or fatigue.  HEENT: Eyes: No visual loss, blurred vision, double vision or yellow sclerae.No hearing loss, sneezing, congestion, runny nose or sore throat.  SKIN: No rash or itching.  CARDIOVASCULAR: per HPI RESPIRATORY: No shortness of breath, cough or sputum.  GASTROINTESTINAL: No anorexia, nausea, vomiting or diarrhea. No abdominal pain or blood.  GENITOURINARY: No burning on urination, no polyuria NEUROLOGICAL: No headache, dizziness, syncope, paralysis, ataxia, numbness or tingling in the extremities. No change in bowel or bladder control.  MUSCULOSKELETAL: No muscle, back pain, joint pain or stiffness.  LYMPHATICS: No enlarged nodes. No history of splenectomy.  PSYCHIATRIC: No history of depression or anxiety.  ENDOCRINOLOGIC: No reports of sweating, cold or heat intolerance. No polyuria or polydipsia.  Marland Kitchen   Physical Examination p 68 bp 120/80 Wt 256 lbs BMI 48 Gen: resting comfortably, no acute distress HEENT: no scleral icterus, pupils equal round and  reactive, no palptable cervical adenopathy,  CV: RRR, no m/r/g, no JVD, no carotid bruits Resp: Clear to auscultation bilaterally GI: abdomen is soft, non-tender, non-distended, normal bowel sounds, no hepatosplenomegaly MSK: extremities are warm, no edema.  Skin: warm, no rash Neuro:  no focal deficits Psych: appropriate affect   Diagnostic Studies Pertinent labs 05/2012: K 4.7 Cr .66 AST 16 ALT 9 TC 179 TG 99 HDL 50 LDL 109 TSH 1.9   12/03/12 EKG: sinus, rate 75, normal axis, LAE, no ischemic changes   03/25/13 Clinic EKG  Sinus rhythm, normal EKG  03/2013 Event Monitor 7 Days No symptoms, normal sinus rhythm   Assessment and  Plan  1. Palpitations  - symptoms have resolved - previous monitor showed no significant arrhythmias - no further workup at this time    Follow up as needed       Arnoldo Lenis, M.D., F.A.C.C.

## 2013-07-27 ENCOUNTER — Ambulatory Visit: Payer: 59 | Admitting: *Deleted

## 2013-08-10 ENCOUNTER — Encounter: Payer: Self-pay | Admitting: *Deleted

## 2013-08-10 ENCOUNTER — Encounter: Payer: 59 | Attending: Obstetrics and Gynecology | Admitting: *Deleted

## 2013-08-10 VITALS — Ht 60.0 in | Wt 255.6 lb

## 2013-08-10 DIAGNOSIS — Z713 Dietary counseling and surveillance: Secondary | ICD-10-CM | POA: Insufficient documentation

## 2013-08-10 DIAGNOSIS — R7309 Other abnormal glucose: Secondary | ICD-10-CM | POA: Insufficient documentation

## 2013-08-10 NOTE — Progress Notes (Signed)
  Medical Nutrition Therapy:  Appt start time: 1130 end time:  1200.  Assessment:  Patient returns today for a follow up for weight management. Her weight is up less than 1 pound from visit 2 months ago. She does express some disappointment with this. However, she started taking Zumba classes 2 days weekly and is now walking on the treadmill for 30 minutes every morning at an increased incline. She notes that her clothes fit looser, her stomach looks flatter, and she feels great. We discussed the importance of not relying solely on weight as an indictor of progress. We discussed using waist circumference as an alternative measure, as I suspect she has lost some fat mass, but gained some muscle. Overall, dietary intake is good with moderate portion sizes, increased vegetables, and generally healthy choices. However, she admits to snacking on cashews frequently throughout the afternoon, which is likely contributing to lack of weight loss.   MEDICATIONS: See list   DIETARY INTAKE:   Usual eating pattern includes 3 meals and 2-3 snacks per day.  24-hr recall:  B ( AM): Herbalife shake, hot tea  Snk ( AM): None  L ( PM): Sandwiches (whole wheat bread, [place], mustard), sometimes fruit or raw vegetables Snk ( PM): Wheat thins, nuts, carrots  D ( PM): Baked meat, vegetables, potatoes/rice  Snk ( PM): Popcorn  Beverages: Hot tea,water  Usual physical activity: Treadmill at least 30 minutes every morning (~1.5 mph), Zumba 60 minutes Tuesdays and Thursdays.   Estimated energy needs: 1500 calories 188 g carbohydrates 94 g protein 42 g fat  Progress Towards Goal(s):  Some progress.   Nutritional Diagnosis:  [place]-3.3 Overweight/obesity As related to history of excessive energy intake. As evidenced by BMI >30.    Intervention:  Nutrition counseling. Patient was praised on her efforts to eat healthy and increase physical activity. We discussed using other metrics (i.e. Waist circumference, clothing fit,  how she feels) as an indicator of success vs. weight alone. We also discussed portion control and planning for snacks.   Goals:  1. 0.5-1 pound weight loss per week.  2. Continue to use strategies to reduce calories at meals (portion control).  3. Monitor portion size of snack foods. Limit cashews to 1 oz per day, include fruit and raw vegetables as alternative snacks. Total daily snacks should be <300 calories per day. 4. Continue current exercise regimen.   Monitoring/Evaluation:  Dietary intake, exercise, and body weight prn.

## 2013-08-14 ENCOUNTER — Other Ambulatory Visit: Payer: Self-pay | Admitting: Family Medicine

## 2013-08-14 NOTE — Telephone Encounter (Signed)
Refill appropriate and filled per protocol. 

## 2013-08-27 ENCOUNTER — Telehealth: Payer: Self-pay | Admitting: Cardiology

## 2013-08-27 MED ORDER — POTASSIUM CHLORIDE CRYS ER 20 MEQ PO TBCR: EXTENDED_RELEASE_TABLET | ORAL | Status: DC

## 2013-08-27 NOTE — Telephone Encounter (Signed)
Received fax refill request  Rx # [id number] Medication:  Klor-Con M20 tablet Qty 30 Sig:  Take one tablet every day Physician:  Harl Bowie

## 2013-08-27 NOTE — Telephone Encounter (Signed)
Medication sent to pharmacy.  [place] pt

## 2013-08-29 ENCOUNTER — Emergency Department (HOSPITAL_BASED_OUTPATIENT_CLINIC_OR_DEPARTMENT_OTHER)
Admission: EM | Admit: 2013-08-29 | Discharge: 2013-08-29 | Disposition: A | Discharge status: Home / Self Care | Payer: 59 | Attending: Emergency Medicine | Admitting: Emergency Medicine

## 2013-08-29 ENCOUNTER — Emergency Department (HOSPITAL_BASED_OUTPATIENT_CLINIC_OR_DEPARTMENT_OTHER): Payer: 59

## 2013-08-29 ENCOUNTER — Encounter (HOSPITAL_BASED_OUTPATIENT_CLINIC_OR_DEPARTMENT_OTHER): Payer: Self-pay | Admitting: Emergency Medicine

## 2013-08-29 DIAGNOSIS — Y9389 Activity, other specified: Secondary | ICD-10-CM | POA: Insufficient documentation

## 2013-08-29 DIAGNOSIS — Y9289 Other specified places as the place of occurrence of the external cause: Secondary | ICD-10-CM | POA: Insufficient documentation

## 2013-08-29 DIAGNOSIS — K219 Gastro-esophageal reflux disease without esophagitis: Secondary | ICD-10-CM | POA: Insufficient documentation

## 2013-08-29 DIAGNOSIS — Z79899 Other long term (current) drug therapy: Secondary | ICD-10-CM | POA: Insufficient documentation

## 2013-08-29 DIAGNOSIS — Z87442 Personal history of urinary calculi: Secondary | ICD-10-CM | POA: Insufficient documentation

## 2013-08-29 DIAGNOSIS — Z8742 Personal history of other diseases of the female genital tract: Secondary | ICD-10-CM | POA: Insufficient documentation

## 2013-08-29 DIAGNOSIS — I10 Essential (primary) hypertension: Secondary | ICD-10-CM | POA: Insufficient documentation

## 2013-08-29 DIAGNOSIS — X500XXA Overexertion from strenuous movement or load, initial encounter: Secondary | ICD-10-CM | POA: Insufficient documentation

## 2013-08-29 DIAGNOSIS — S93409A Sprain of unspecified ligament of unspecified ankle, initial encounter: Secondary | ICD-10-CM | POA: Insufficient documentation

## 2013-08-29 DIAGNOSIS — E669 Obesity, unspecified: Secondary | ICD-10-CM | POA: Insufficient documentation

## 2013-08-29 DIAGNOSIS — S93402A Sprain of unspecified ligament of left ankle, initial encounter: Secondary | ICD-10-CM

## 2013-08-29 MED ORDER — HYDROCODONE-ACETAMINOPHEN 5-325 MG PO TABS: 2.0000 | ORAL_TABLET | ORAL | Status: DC | PRN

## 2013-08-29 NOTE — ED Provider Notes (Signed)
CSN: [id number]     Arrival date & time 08/29/13  1634 History   First MD Initiated Contact with Patient 08/29/13 1727     Chief Complaint  Patient presents with  . Ankle Injury     (Consider location/radiation/quality/duration/timing/severity/associated sxs/prior Treatment) Patient is a 48 y.o. female presenting with ankle pain. The history is provided by the patient. No language interpreter was used.  Ankle Pain Location:  Ankle Ankle location:  L ankle Pain details:    Quality:  Aching   Radiates to:  Does not radiate   Severity:  Moderate   Onset quality:  Gradual   Timing:  Constant   Progression:  Partially resolved Chronicity:  New Dislocation: no   Foreign body present:  No foreign bodies Prior injury to area:  No Relieved by:  Nothing Worsened by:  Nothing tried Ineffective treatments:  None tried Risk factors: no concern for non-accidental trauma   Pt reports she missed a step and turned ankle yesterday.   Pt can not tolerate weight bearing today  Past Medical History  Diagnosis Date  . Kidney stones   . IBS (irritable bowel syndrome)   . Obesity   . Hypertension   . Chronic cervicitis   . Menorrhagia   . GERD (gastroesophageal reflux disease)    Past Surgical History  Procedure Laterality Date  . Uterine ablation    . Polypectomy      uterus  . Tubal ligation    . Vaginal hysterectomy      fibroids  . Esophagogastroduodenoscopy  12/21/2008  . Colonoscopy  10/19/2005  . Cesarean section    . Hysteroscopy w/d&c      resection of endometrial polyp  . Breast lumpectomy      left, benign hamartoma   Family History  Problem Relation Age of Onset  . Hypertension Father   . Hypertension Mother   . Diabetes Mother   . Coronary artery disease Mother   . Cancer Brother     Carcinoid tumor of the appendix   History  Substance Use Topics  . Smoking status: Never Smoker   . Smokeless tobacco: Never Used  . Alcohol Use: Yes     Comment: socially   OB  History   Grav Para Term Preterm Abortions TAB SAB Ect Mult Living                 Review of Systems  Musculoskeletal: Positive for joint swelling and myalgias.  All other systems reviewed and are negative.     Allergies  Review of patient's allergies indicates no known allergies.  Home Medications   Prior to Admission medications   Medication Sig Start Date End Date Taking? Authorizing Provider  fluticasone (FLONASE) 50 MCG/ACT nasal spray Place 2 sprays into the nose at bedtime. 11/14/12   Star Age, MD  losartan-hydrochlorothiazide (HYZAAR) 100-12.5 MG per tablet TAKE 1 TABLET BY MOUTH DAILY. 08/14/13   Alycia Rossetti, MD  omeprazole (PRILOSEC) 40 MG capsule Take 1 capsule (40 mg total) by mouth daily. 09/23/12   Susy Frizzle, MD  potassium chloride SA (KLOR-CON M20) 20 MEQ tablet TAKE 1 TABLET EVERY DAY 08/27/13   Arnoldo Lenis, MD  Probiotic Product (PROBIOTIC DAILY PO) Take 1 tablet by mouth daily.    Historical Provider, MD   BP 126/73  Pulse 65  Temp(Src) 98 F (36.7 C) (Oral)  Resp 20  Ht 5\' 1"  (1.549 m)  Wt 240 lb (108.863 kg)  BMI 45.37 kg/m2  SpO2 99% Physical Exam  Nursing note and vitals reviewed. Constitutional: She appears well-developed and well-nourished.  HENT:  Head: Normocephalic.  Musculoskeletal: She exhibits tenderness.  Neurological: She is alert.  Swollen left ankle,   Decreased range of motion,  nv and ns intact  Skin: Skin is warm.  Psychiatric: She has a normal mood and affect.    ED Course  Procedures (including critical care time) Labs Review Labs Reviewed - No data to display  Imaging Review Dg Ankle Complete Left  08/29/2013   CLINICAL DATA:  Pain post trauma  EXAM: LEFT ANKLE COMPLETE - 3+ VIEW  COMPARISON:  November 12, 2011  FINDINGS: Frontal, oblique, and lateral views were obtained. There is no fracture or effusion. Ankle mortise appears intact. There are spurs arising from the posterior and inferior calcaneus.  IMPRESSION:  Calcaneal spurs.  No fracture.  Mortise intact.   Electronically Signed   By: Lowella Grip M.D.   On: 08/29/2013 17:14     EKG Interpretation None      MDM   Final diagnoses:  Sprain of ankle, left    [place], crutches,   Follow up with Dr. Barbaraann Barthel Hydrocodone for pain   Fransico Meadow, PA-C 08/29/13 2053

## 2013-08-29 NOTE — ED Notes (Signed)
Pt had own ASO ankle splint from home

## 2013-08-29 NOTE — Discharge Instructions (Signed)

## 2013-08-29 NOTE — ED Notes (Signed)
Pain and swelling to left ankle after missing a step while walking yesterday.  Using a brace from home for comfort.

## 2013-09-09 NOTE — ED Provider Notes (Signed)
Medical screening examination/treatment/procedure(s) were performed by non-physician practitioner and as supervising physician I was immediately available for consultation/collaboration.   EKG Interpretation None        Tanna Furry, MD 09/09/13 0000

## 2013-09-29 ENCOUNTER — Telehealth: Payer: Self-pay | Admitting: Neurology

## 2013-09-29 ENCOUNTER — Ambulatory Visit: Payer: 59 | Admitting: Neurology

## 2013-09-29 NOTE — Telephone Encounter (Signed)
Patient no showed for an appointment today, 09/29/2013, at 1530.

## 2013-10-09 ENCOUNTER — Other Ambulatory Visit: Payer: Self-pay | Admitting: Family Medicine

## 2013-12-22 ENCOUNTER — Encounter: Payer: Self-pay | Admitting: Family Medicine

## 2013-12-22 ENCOUNTER — Ambulatory Visit (INDEPENDENT_AMBULATORY_CARE_PROVIDER_SITE_OTHER): Payer: 59 | Admitting: Family Medicine

## 2013-12-22 VITALS — BP 108/62 | HR 74 | Temp 98.5°F | Resp 18 | Ht 61.5 in | Wt 260.0 lb

## 2013-12-22 DIAGNOSIS — Z0000 Encounter for general adult medical examination without abnormal findings: Secondary | ICD-10-CM

## 2013-12-22 DIAGNOSIS — Z79899 Other long term (current) drug therapy: Secondary | ICD-10-CM

## 2013-12-22 DIAGNOSIS — Z23 Encounter for immunization: Secondary | ICD-10-CM

## 2013-12-22 LAB — COMPLETE METABOLIC PANEL WITH GFR
ALK PHOS: 72 U/L (ref 39–117)
ALT: 17 U/L (ref 0–35)
AST: 18 U/L (ref 0–37)
Albumin: 4 g/dL (ref 3.5–5.2)
BUN: 12 mg/dL (ref 6–23)
CALCIUM: 9.5 mg/dL (ref 8.4–10.5)
CHLORIDE: 103 meq/L (ref 96–112)
CO2: 27 mEq/L (ref 19–32)
Creat: 0.75 mg/dL (ref 0.50–1.10)
GFR, Est African American: >89 mL/min
GFR, Est Non African American: >89 mL/min
Glucose, Bld: 103 mg/dL — ABNORMAL HIGH (ref 70–99)
POTASSIUM: 4.5 meq/L (ref 3.5–5.3)
Sodium: 139 mEq/L (ref 135–145)
TOTAL PROTEIN: 6.6 g/dL (ref 6.0–8.3)
Total Bilirubin: 0.4 mg/dL (ref 0.2–1.2)

## 2013-12-22 LAB — TSH: TSH: 1.656 u[IU]/mL (ref 0.350–4.500)

## 2013-12-22 LAB — LIPID PANEL
CHOLESTEROL: 175 mg/dL (ref 0–200)
HDL: 60 mg/dL (ref >39)
LDL Cholesterol: 99 mg/dL (ref 0–99)
Total CHOL/HDL Ratio: 2.9 Ratio
Triglycerides: 80 mg/dL (ref <150)
VLDL: 16 mg/dL (ref 0–40)

## 2013-12-22 LAB — CBC WITH DIFFERENTIAL/PLATELET
Basophils Absolute: 0 10*3/uL (ref 0.0–0.1)
Basophils Relative: 1 % (ref 0–1)
EOS ABS: 0.1 10*3/uL (ref 0.0–0.7)
Eosinophils Relative: 3 % (ref 0–5)
HCT: 38 % (ref 36.0–46.0)
Hemoglobin: 13 g/dL (ref 12.0–15.0)
LYMPHS ABS: 1.9 10*3/uL (ref 0.7–4.0)
Lymphocytes Relative: 46 % (ref 12–46)
MCH: 28.1 pg (ref 26.0–34.0)
MCHC: 34.2 g/dL (ref 30.0–36.0)
MCV: 82.3 fL (ref 78.0–100.0)
Monocytes Absolute: 0.3 10*3/uL (ref 0.1–1.0)
Monocytes Relative: 7 % (ref 3–12)
NEUTROS ABS: 1.8 10*3/uL (ref 1.7–7.7)
NEUTROS PCT: 43 % (ref 43–77)
PLATELETS: 269 10*3/uL (ref 150–400)
RBC: 4.62 MIL/uL (ref 3.87–5.11)
RDW: 15.2 % (ref 11.5–15.5)
WBC: 4.2 10*3/uL (ref 4.0–10.5)

## 2013-12-22 MED ORDER — LORCASERIN HCL 10 MG PO TABS: 10.0000 mg | ORAL_TABLET | Freq: Two times a day (BID) | ORAL | Status: DC

## 2013-12-22 NOTE — Progress Notes (Signed)
Subjective:    Patient ID: Patricia [name], female    DOB: [birth year], 48 y.o.   MRN: [id number]  HPI  Patient is here today for complete physical exam. She is concerned because she is having a difficult time losing weight. She is exercising frequently. She is performing vigorous aerobic exercise. She is also drastically changed her diet and yet she continues to have problems obesity. She believes it may be due to menopausal changes she is experiencing. She is having hot flashes. She would like her thyroid levels checked. Should also like a flu shot. She has her mammogram scheduled for October. Her last colonoscopy was in 2007 and is up to date. History of a hysterectomy and therefore does not require Pap smear but she sees a gynecologist. Past Medical History  Diagnosis Date  . Kidney stones   . IBS (irritable bowel syndrome)   . Obesity   . Hypertension   . Chronic cervicitis   . Menorrhagia   . GERD (gastroesophageal reflux disease)    Past Surgical History  Procedure Laterality Date  . Uterine ablation    . Polypectomy      uterus  . Tubal ligation    . Vaginal hysterectomy      fibroids  . Esophagogastroduodenoscopy  12/21/2008  . Colonoscopy  10/19/2005  . Cesarean section    . Hysteroscopy w/d&c      resection of endometrial polyp  . Breast lumpectomy      left, benign hamartoma   Current Outpatient Prescriptions on File Prior to Visit  Medication Sig Dispense Refill  . fluticasone (FLONASE) 50 MCG/ACT nasal spray Place 2 sprays into the nose at bedtime.  16 g  6  . HYDROcodone-acetaminophen (NORCO/VICODIN) 5-325 MG per tablet Take 2 tablets by mouth every 4 (four) hours as needed.  20 tablet  0  . losartan-hydrochlorothiazide (HYZAAR) 100-12.5 MG per tablet TAKE 1 TABLET BY MOUTH DAILY.  90 tablet  1  . omeprazole (PRILOSEC) 40 MG capsule TAKE 1 CAPSULE (40 MG TOTAL) BY MOUTH DAILY.  30 capsule  11  . potassium chloride SA (KLOR-CON M20) 20 MEQ tablet TAKE 1 TABLET  EVERY DAY  30 tablet  3  . Probiotic Product (PROBIOTIC DAILY PO) Take 1 tablet by mouth daily.       No current facility-administered medications on file prior to visit.   No Known Allergies History   Social History  . Marital Status: Married    Spouse Name: N/A    Number of Children: 2  . Years of Education: N/A   Occupational History  . QUALITY ANALYST    Social History Main Topics  . Smoking status: Never Smoker   . Smokeless tobacco: Never Used  . Alcohol Use: Yes     Comment: socially  . Drug Use: No  . Sexual Activity: Not on file   Other Topics Concern  . Not on file   Social History Narrative  . No narrative on file   Family History  Problem Relation Age of Onset  . Hypertension Father   . Hypertension Mother   . Diabetes Mother   . Coronary artery disease Mother   . Cancer Brother     Carcinoid tumor of the appendix     Review of Systems  All other systems reviewed and are negative.      Objective:   Physical Exam  Vitals reviewed. Constitutional: She is oriented to person, place, and time. She appears well-developed and well-nourished. No  distress.  HENT:  Head: Normocephalic and atraumatic.  Right Ear: External ear normal.  Left Ear: External ear normal.  Nose: Nose normal.  Mouth/Throat: Oropharynx is clear and moist. No oropharyngeal exudate.  Eyes: Conjunctivae and EOM are normal. Pupils are equal, round, and reactive to light. Right eye exhibits no discharge. Left eye exhibits no discharge. No scleral icterus.  Neck: Normal range of motion. Neck supple. No JVD present. No tracheal deviation present. No thyromegaly present.  Cardiovascular: Normal rate, regular rhythm, normal heart sounds and intact distal pulses.  Exam reveals no gallop and no friction rub.   No murmur heard. Pulmonary/Chest: Effort normal and breath sounds normal. No stridor. No respiratory distress. She has no wheezes. She has no rales. She exhibits no tenderness.   Abdominal: Soft. Bowel sounds are normal. She exhibits no distension and no mass. There is no tenderness. There is no rebound and no guarding.  Musculoskeletal: Normal range of motion. She exhibits no edema and no tenderness.  Lymphadenopathy:    She has no cervical adenopathy.  Neurological: She is alert and oriented to person, place, and time. She has normal reflexes. She displays normal reflexes. No cranial nerve deficit. She exhibits normal muscle tone. Coordination normal.  Skin: Skin is warm. No rash noted. She is not diaphoretic. No erythema. No pallor.  Psychiatric: She has a normal mood and affect. Her behavior is normal. Judgment and thought content normal.          Assessment & Plan:  Routine general medical examination at a health care facility - Plan: COMPLETE METABOLIC PANEL WITH GFR, Lipid panel, CBC with Differential, TSH, MM Digital Screening  Encounter for long-term (current) use of other medications - Plan: COMPLETE METABOLIC PANEL WITH GFR, Lipid panel, CBC with Differential, TSH   Patient's physical exam is normal except for obesity. I encouraged her to continue her therapeutic lifestyle changes. In addition I'll start the patient on belviq 10 mg pobid to help facilitate weight loss.  I will also check a CBC, CMP, fasting lipid panel, TSH. Patient received her flu shot today. Her mammograms are been scheduled. Her colonoscopy is up to date.  I also removed a small skin tags on the right side of her neck using a pair of scissors without anesthesia. Patient tolerated procedure well no complications. She was not charged for this.

## 2013-12-22 NOTE — Addendum Note (Signed)
Addended by: Shary Decamp B on: 12/22/2013 10:37 AM   Modules accepted: Orders

## 2013-12-24 ENCOUNTER — Encounter: Payer: Self-pay | Admitting: *Deleted

## 2013-12-28 ENCOUNTER — Telehealth: Payer: Self-pay | Admitting: Family Medicine

## 2013-12-28 NOTE — Telephone Encounter (Signed)
Patient is calling to see if her test results were in [phone]

## 2014-01-02 NOTE — Telephone Encounter (Signed)
Letter sent.

## 2014-01-04 ENCOUNTER — Telehealth: Payer: Self-pay | Admitting: Cardiology

## 2014-01-04 MED ORDER — POTASSIUM CHLORIDE CRYS ER 20 MEQ PO TBCR: EXTENDED_RELEASE_TABLET | ORAL | Status: DC

## 2014-01-04 NOTE — Telephone Encounter (Signed)
Received fax refill request  Rx # [id number] Medication:  Klor-Con M20 tablet Qty 30 Sig:  Take one tablet every day Physician:  Harl Bowie

## 2014-01-22 ENCOUNTER — Other Ambulatory Visit: Payer: Self-pay | Admitting: Family Medicine

## 2014-01-25 ENCOUNTER — Encounter: Payer: Self-pay | Admitting: Neurology

## 2014-01-25 ENCOUNTER — Ambulatory Visit (INDEPENDENT_AMBULATORY_CARE_PROVIDER_SITE_OTHER): Payer: 59 | Admitting: Neurology

## 2014-01-25 VITALS — BP 116/69 | HR 60 | Temp 98.6°F | Ht 62.5 in | Wt 258.0 lb

## 2014-01-25 DIAGNOSIS — Z9989 Dependence on other enabling machines and devices: Secondary | ICD-10-CM

## 2014-01-25 DIAGNOSIS — G4733 Obstructive sleep apnea (adult) (pediatric): Secondary | ICD-10-CM

## 2014-01-25 NOTE — Progress Notes (Signed)
Subjective:    Patient ID: Patricia [name] is a 48 y.o. female.  HPI     Interim history:   Ms. Galka is a very pleasant 48 year old right-handed woman with an underlying medical history of irritable bowel syndrome, obesity, hypertension, reflux disease as well as kidney stones, who presents for followup consultation of her obstructive sleep apnea. She is unaccompanied today. Of note she did not show for an appointment on 05/18/2013 as well as 09/29/2013. I last saw her on 11/14/2012, at which time we discussed her baseline sleep study as well as CPAP titration study findings as well as her initial compliance data. She reported good results with CPAP and resolution of her morning headaches, but had ongoing issues with nasal congestion, rhinorrhea and postnasal drip. I prescribed Flonase for nighttime use as well as requested that she start using nasal saline rinses during the day. I reviewed her compliance data at the time and congratulated her on her good compliance. We considered referral to an allergy specialist. I reviewed the patient's CPAP compliance data from 11/30/2012 to 12/29/2012, which is a total of 30 days, during which time the patient used CPAP every day. The average usage for all days was 5 hours and 51 minutes. The percent used days greater than 4 hours was 90 %, indicating excellent compliance. The residual AHI was 0.5 per hour, indicating a very appropriate treatment pressure of 8 cwp with EPR of 3.  Today, I reviewed her compliance data from 12/26/2013 through 01/24/2014 which is a total of 30 days during which time she was under percent compliant, average usage of 6 hours and 40 minutes, residual AHI low at 0.5 per hour, leak low at 4.2 L/m for the 95th percentile, pressure at 8 cm with EPR of 3.  Today, she reports doing well with CPAP. She is fully compliant. She continues to be pleased with how she has done. She no longer has morning headaches. She has better daytime energy. She  is trying to lose weight and has lost a few pounds. She is doing Zumba twice a week. She has not had any changes in her medications or medical history and has taken her flu shot for this year.  I first met her on 08/01/2012, at which time she reported nonrestorative sleep, morning headaches, daytime somnolence as well as snoring. I asked her to come back for sleep study and she had a baseline sleep test on 08/12/2012: She had an increased percentage of stage II sleep. Sleep efficiency was reduced at 76%. She had mild to moderate snoring. She had a total of 9 obstructive apneas and 49 obstructive hypopneas, resulting in an AHI of 10.4 per hour, markedly worse in REM sleep with 43.9 events per hour. Baseline oxygen saturation was 94% and her nadir was 77% in REM sleep. Based on the degree of desaturation I asked her to come back for a CPAP titration study. She had that test on 09/08/2012: Sleep efficiency was normal at 92.9%. She had a normal percentage of stage I and 2 sleep, 12.3 percent of deep sleep and an increased percentage of REM sleep at 35.2% with a reduced REM latency of 54 minutes. She had mild pruritic leg movements at 7.9 per hour but no associated arousals from this. She was titrated on CPAP using a nasal pillows mask from 5-8 cm and had a residual AHI of 0 per hour on 8 cm of pressure. Her baseline oxygen saturation was 96%, her nadir was 88% with less  than 1 minute below the saturation of 90% for the night. Based on those test results I prescribed CPAP for her. I reviewed compliance data from 10/03/2012 through 11/09/2012 (38 days), during which time she used it every day except for one day. Percent used days greater than 4 hours was 82% indicating good compliance. Her average usage was 5 hours and 3 minutes for all days and her residual AHI was 0.9 per hour indicating an adequate treatment pressure of 8 cm of water with EPR of 3.   Her Past Medical History Is Significant For: Past Medical  History  Diagnosis Date  . Kidney stones   . IBS (irritable bowel syndrome)   . Obesity   . Hypertension   . Chronic cervicitis   . Menorrhagia   . GERD (gastroesophageal reflux disease)     Her Past Surgical History Is Significant For: Past Surgical History  Procedure Laterality Date  . Uterine ablation    . Polypectomy      uterus  . Tubal ligation    . Vaginal hysterectomy      fibroids  . Esophagogastroduodenoscopy  12/21/2008  . Colonoscopy  10/19/2005  . Cesarean section    . Hysteroscopy w/d&c      resection of endometrial polyp  . Breast lumpectomy      left, benign hamartoma    Her Family History Is Significant For: Family History  Problem Relation Age of Onset  . Hypertension Father   . Hypertension Mother   . Diabetes Mother   . Coronary artery disease Mother   . Cancer Brother     Carcinoid tumor of the appendix    Her Social History Is Significant For: History   Social History  . Marital Status: Married    Spouse Name: Montine Circle    Number of Children: 2  . Years of Education: 16   Occupational History  . QUALITY ANALYST    Social History Main Topics  . Smoking status: Never Smoker   . Smokeless tobacco: Never Used  . Alcohol Use: Yes     Comment: socially  . Drug Use: No  . Sexual Activity: None   Other Topics Concern  . None   Social History Narrative   Patient is right handed and consumes green tea daily    Her Allergies Are:  No Known Allergies:   Her Current Medications Are:  Outpatient Encounter Prescriptions as of 01/25/2014  Medication Sig  . fluticasone (FLONASE) 50 MCG/ACT nasal spray PLACE 2 SPRAYS INTO THE NOSE AT BEDTIME.  Marland Kitchen losartan-hydrochlorothiazide (HYZAAR) 100-12.5 MG per tablet TAKE 1 TABLET BY MOUTH DAILY.  Marland Kitchen omeprazole (PRILOSEC) 40 MG capsule TAKE 1 CAPSULE (40 MG TOTAL) BY MOUTH DAILY.  Marland Kitchen potassium chloride SA (KLOR-CON M20) 20 MEQ tablet TAKE 1 TABLET EVERY DAY  . Probiotic Product (PROBIOTIC DAILY PO) Take 1  tablet by mouth daily.  . [DISCONTINUED] HYDROcodone-acetaminophen (NORCO/VICODIN) 5-325 MG per tablet Take 2 tablets by mouth every 4 (four) hours as needed.  . [DISCONTINUED] Lorcaserin HCl (BELVIQ) 10 MG TABS Take 10 mg by mouth 2 (two) times daily.  :  Review of Systems:  Out of a complete 14 point review of systems, all are reviewed and negative with the exception of these symptoms as listed below:   Review of Systems  All other systems reviewed and are negative.   Objective:  Neurologic Exam  Physical Exam Physical Examination:   Filed Vitals:   01/25/14 1100  BP: 116/69  Pulse: 60  Temp: 98.6 F (37 C)   Physical Examination:   Filed Vitals:   01/25/14 1100  BP: 116/69  Pulse: 60  Temp: 98.6 F (37 C)     General Examination: The patient is a very pleasant 48 y.o. female in no acute distress. She appears well-developed and well-nourished and well groomed. She is obese.she is in good spirits today.  HEENT: Normocephalic, atraumatic, pupils are equal, round and reactive to light and accommodation. Extraocular tracking is good without limitation to gaze excursion or nystagmus noted. Normal smooth pursuit is noted. Hearing is grossly intact. Face is symmetric with normal facial animation and normal facial sensation. Speech is clear with no dysarthria noted. There is no hypophonia. There is no lip, neck/head, jaw or voice tremor. Neck is supple with full range of passive and active motion. There are no carotid bruits on auscultation. Oropharynx exam reveals: good dental hygiene and moderate airway crowding, due to elongated uvula, large tongue, and tonsillar size are 1+ bilaterally. Mallampati is class II. Tongue protrudes centrally and palate elevates symmetrically. Nasal passages are mildly erythematous, but no significant mucosal bogginess is noted. Neck size is unchanged.   Chest: Clear to auscultation without wheezing, rhonchi or crackles noted.  Heart: S1+S2+0,  regular and normal without murmurs, rubs or gallops noted.   Abdomen: Soft, non-tender and non-distended with normal bowel sounds appreciated on auscultation.  Extremities: There is no pitting edema in the distal lower extremities bilaterally.  Skin: Warm and dry without trophic changes noted. There are no varicose veins.  Musculoskeletal: exam reveals no obvious joint deformities, tenderness or joint swelling or erythema.   Neurologically:  Mental status: The patient is awake, alert and oriented in all 4 spheres. Her memory, attention, language and knowledge are appropriate. There is no aphasia, agnosia, apraxia or anomia. Speech is clear with normal prosody and enunciation. Thought process is linear. Mood is congruent and affect is normal.  Cranial nerves are as described above under HEENT exam. In addition, shoulder shrug is normal with equal shoulder height noted. Motor exam: Normal bulk, strength and tone is noted. There is no drift, tremor or rebound. Romberg is negative. Reflexes are 2+ throughout. Toes are downgoing bilaterally. Fine motor skills are intact with normal finger taps, normal hand movements, normal rapid alternating patting, normal foot taps and normal foot agility.  Cerebellar testing shows no dysmetria or intention tremor on finger to nose testing. There is no truncal or gait ataxia.  Sensory exam is intact to light touch, pinprick, vibration, temperature sense in the upper and lower extremities.  Gait, station and balance are unremarkable. No veering to one side is noted. No leaning to one side is noted. Posture is age-appropriate and stance is narrow based. No problems turning are noted. She turns en bloc. Tandem walk is unremarkable. Intact toe and heel stance is noted.            Assessment and Plan:   In summary, JEYLIN WOODMANSEE is a very pleasant 48 year old female with an underlying medical history of irritable bowel syndrome, obesity, hypertension, reflux disease as  well as kidney stones, who presents for followup consultation of her obstructive sleep apnea, treated with CPAP treatment at a pressure of 8 cwp with good results and excellent compliance. Her physical exam is stable and She indicates good results with the use of CPAP, and good tolerance of the pressure and mask. I congratulated her on her superb compliance and I discussed her sleep study results with her  from last year in May and June. I also explained the compliance data results to her in detail today. I encouraged her to continue to use CPAP regularly to help reduce cardiovascular risk and to keep her sleep related symptoms treated as well as they are currently.  We again talked about trying to maintaining a healthy lifestyle in general. I encouraged the patient to eat healthy, exercise daily and keep well hydrated, to keep a scheduled bedtime and wake time routine, to not skip any meals and eat healthy snacks in between meals and to have protein with every meal. I stressed the importance of regular exercise and congratulated her on her weight loss. I answered all her questions today and the patient was in agreement with the above outlined plan. I would like to see the patient back in one year.

## 2014-01-25 NOTE — Patient Instructions (Signed)
Keep up the good work! I will see you back in 12 months for sleep apnea check up.   

## 2014-01-27 ENCOUNTER — Encounter: Payer: Self-pay | Admitting: Family Medicine

## 2014-01-27 ENCOUNTER — Telehealth: Payer: Self-pay | Admitting: Neurology

## 2014-01-27 DIAGNOSIS — G4733 Obstructive sleep apnea (adult) (pediatric): Secondary | ICD-10-CM

## 2014-01-27 DIAGNOSIS — Z9989 Dependence on other enabling machines and devices: Secondary | ICD-10-CM

## 2014-01-27 NOTE — Telephone Encounter (Signed)
Pt needs a new rx to renew CPAP supplies

## 2014-01-27 NOTE — Telephone Encounter (Signed)
Patient recently seen on 01/25/2014. Order placed for CPAP supplies today.

## 2014-02-04 ENCOUNTER — Telehealth: Payer: Self-pay | Admitting: Family Medicine

## 2014-02-04 NOTE — Telephone Encounter (Signed)
[phone] Patient says that the diet pills that we prescribed for her are 250.00 would like to know if we can call in something else for her  [facility]

## 2014-02-05 NOTE — Telephone Encounter (Signed)
She could try adipex 37.5 poqam (30) Rfx2 but she needs to watch her bp closely.

## 2014-02-05 NOTE — Telephone Encounter (Signed)
LMTRC

## 2014-02-10 ENCOUNTER — Encounter: Payer: Self-pay | Admitting: Family Medicine

## 2014-02-17 ENCOUNTER — Other Ambulatory Visit: Payer: Self-pay | Admitting: Family Medicine

## 2014-02-17 NOTE — Telephone Encounter (Signed)
-----   Message from [place] sent at 02/15/2014 10:30 AM EST ----- Patient is returning your call  [phone]

## 2014-02-17 NOTE — Telephone Encounter (Signed)
LMTRC

## 2014-02-22 MED ORDER — PHENTERMINE HCL 37.5 MG PO TABS: 37.5000 mg | ORAL_TABLET | Freq: Every day | ORAL | Status: DC

## 2014-02-22 NOTE — Telephone Encounter (Signed)
Pt is aware to keep check on BP and med called to pharm

## 2014-03-05 ENCOUNTER — Encounter: Payer: Self-pay | Admitting: Family Medicine

## 2014-03-05 ENCOUNTER — Ambulatory Visit (INDEPENDENT_AMBULATORY_CARE_PROVIDER_SITE_OTHER): Payer: 59 | Admitting: Family Medicine

## 2014-03-05 VITALS — BP 110/60 | HR 88 | Temp 98.4°F | Resp 18 | Wt 258.0 lb

## 2014-03-05 DIAGNOSIS — M791 Myalgia, unspecified site: Secondary | ICD-10-CM

## 2014-03-05 NOTE — Progress Notes (Signed)
Subjective:    Patient ID: Patricia [name], female    DOB: Dec 30, 1965, 48 y.o.   MRN: [id number]  HPI Monday, the patient developed a severe cramp/charley horse in her right leg. Tuesday she was complaining of muscle pains and cramps in her right leg, her lower back, and in the posterior aspects of both shoulders. By Wednesday the pain had worsened. Today she is completely pain-free. She is still slightly tender to palpation in the hamstring and quadriceps in the right leg. She has no tenderness to palpation in her lower back. There is no tenderness to palpation in the latissimus dorsi in the trapezius or in the deltoid muscles. She has painless range of motion in both shoulders and in her back. She is still slightly stiff in her right leg. Her potassium was checked and found to be normal and at the end of September. She denies any fevers or chills. She is compliant with taking her potassium. Past Medical History  Diagnosis Date  . Kidney stones   . IBS (irritable bowel syndrome)   . Obesity   . Hypertension   . Chronic cervicitis   . Menorrhagia   . GERD (gastroesophageal reflux disease)    Past Surgical History  Procedure Laterality Date  . Uterine ablation    . Polypectomy      uterus  . Tubal ligation    . Vaginal hysterectomy      fibroids  . Esophagogastroduodenoscopy  12/21/2008  . Colonoscopy  10/19/2005  . Cesarean section    . Hysteroscopy w/d&c      resection of endometrial polyp  . Breast lumpectomy      left, benign hamartoma   Current Outpatient Prescriptions on File Prior to Visit  Medication Sig Dispense Refill  . fluticasone (FLONASE) 50 MCG/ACT nasal spray PLACE 2 SPRAYS INTO THE NOSE AT BEDTIME. 16 g 4  . losartan-hydrochlorothiazide (HYZAAR) 100-12.5 MG per tablet TAKE 1 TABLET BY MOUTH EVERY DAY 90 tablet 1  . omeprazole (PRILOSEC) 40 MG capsule TAKE 1 CAPSULE (40 MG TOTAL) BY MOUTH DAILY. 30 capsule 11  . phentermine (ADIPEX-P) 37.5 MG tablet Take 1 tablet  (37.5 mg total) by mouth daily before breakfast. 30 tablet 2  . potassium chloride SA (KLOR-CON M20) 20 MEQ tablet TAKE 1 TABLET EVERY DAY 30 tablet 3  . Probiotic Product (PROBIOTIC DAILY PO) Take 1 tablet by mouth daily.     No current facility-administered medications on file prior to visit.   No Known Allergies History   Social History  . Marital Status: Married    Spouse Name: Montine Circle    Number of Children: 2  . Years of Education: 16   Occupational History  . QUALITY ANALYST    Social History Main Topics  . Smoking status: Never Smoker   . Smokeless tobacco: Never Used  . Alcohol Use: Yes     Comment: socially  . Drug Use: No  . Sexual Activity: Not on file   Other Topics Concern  . Not on file   Social History Narrative   Patient is right handed and consumes green tea daily      Review of Systems  All other systems reviewed and are negative.      Objective:   Physical Exam  Constitutional: She is oriented to person, place, and time.  Cardiovascular: Normal rate, regular rhythm and normal heart sounds.   Pulmonary/Chest: Effort normal and breath sounds normal.  Musculoskeletal: Normal range of motion. She exhibits no  edema or tenderness.  Neurological: She is alert and oriented to person, place, and time. She has normal reflexes. She displays normal reflexes. No cranial nerve deficit. She exhibits normal muscle tone. Coordination normal.  Vitals reviewed.         Assessment & Plan:  Myalgia  Patient has recently suffered cramps in her right leg and muscle stiffness in her lower back. I have recommended an outpatient stretching program. I gave the patient a handout on how to perform stretches the primarily target her hamstrings, her quadriceps, her lower back, and her gluteal muscles. I've also recommended possibly attending yoga classes to improve her stretching and flexibility. Patient can also try over-the-counter magnesium supplements to help prevent  cramping. Patient is to call me immediately if her cramps return or worsen. She is asymptomatic

## 2014-05-18 ENCOUNTER — Telehealth: Payer: Self-pay | Admitting: Cardiology

## 2014-05-18 NOTE — Telephone Encounter (Signed)
Forwarding to PMD to manage from this point moving forward.  Last seen by cardiology on 07/07/2013 by Dr. Harl Bowie.  Per his note, palpitations had resolved & follow up was to be as needed.

## 2014-05-18 NOTE — Telephone Encounter (Signed)
Received fax refill request  Rx # [id number] Medication:  Klor-Con M20 tablet Qty 30 Sig:  Take one tablet every day Physician:  Harl Bowie

## 2014-05-19 NOTE — Telephone Encounter (Signed)
?   OK to Refill - we have never filled this for her before - Dr. Harl Bowie was refilling for her and it looks like they released her from care but was not sure if she still needed to be on this?

## 2014-05-20 MED ORDER — POTASSIUM CHLORIDE CRYS ER 20 MEQ PO TBCR: EXTENDED_RELEASE_TABLET | ORAL | Status: DC

## 2014-05-20 NOTE — Telephone Encounter (Signed)
Ok with refill if she has been taking it regularly and her potassium has remained normal.  The hctz may cause low potassium.

## 2014-05-20 NOTE — Telephone Encounter (Signed)
Med sent to pharm 

## 2014-07-02 ENCOUNTER — Ambulatory Visit (INDEPENDENT_AMBULATORY_CARE_PROVIDER_SITE_OTHER): Payer: 59 | Admitting: Family Medicine

## 2014-07-02 ENCOUNTER — Encounter: Payer: Self-pay | Admitting: Family Medicine

## 2014-07-02 VITALS — BP 100/60 | HR 82 | Temp 98.3°F | Resp 16 | Ht 61.5 in | Wt 259.0 lb

## 2014-07-02 DIAGNOSIS — M7552 Bursitis of left shoulder: Secondary | ICD-10-CM

## 2014-07-02 DIAGNOSIS — R7309 Other abnormal glucose: Secondary | ICD-10-CM

## 2014-07-02 DIAGNOSIS — R7303 Prediabetes: Secondary | ICD-10-CM

## 2014-07-02 LAB — COMPLETE METABOLIC PANEL WITH GFR
ALT: 12 U/L (ref 0–35)
AST: 13 U/L (ref 0–37)
Albumin: 4.2 g/dL (ref 3.5–5.2)
Alkaline Phosphatase: 82 U/L (ref 39–117)
BUN: 16 mg/dL (ref 6–23)
CO2: 25 meq/L (ref 19–32)
CREATININE: 0.76 mg/dL (ref 0.50–1.10)
Calcium: 9.7 mg/dL (ref 8.4–10.5)
Chloride: 101 mEq/L (ref 96–112)
GFR, Est African American: >89 mL/min
GFR, Est Non African American: >89 mL/min
GLUCOSE: 94 mg/dL (ref 70–99)
POTASSIUM: 3.8 meq/L (ref 3.5–5.3)
Sodium: 140 mEq/L (ref 135–145)
TOTAL PROTEIN: 7 g/dL (ref 6.0–8.3)
Total Bilirubin: 0.4 mg/dL (ref 0.2–1.2)

## 2014-07-02 LAB — HEMOGLOBIN A1C
HEMOGLOBIN A1C: 6 % — AB (ref <5.7)
MEAN PLASMA GLUCOSE: 126 mg/dL — AB (ref <117)

## 2014-07-02 NOTE — Progress Notes (Signed)
   Subjective:    Patient ID: Patricia [name], female    DOB: 16-Apr-1965, 49 y.o.   MRN: [id number]  HPI 3 weeks of shoulder pain in her left shoulder. Worse with abduction greater than 110. Internal rotation. Aches and throbs at night. Denies any injury.   Past Medical History  Diagnosis Date  . Kidney stones   . IBS (irritable bowel syndrome)   . Obesity   . Hypertension   . Chronic cervicitis   . Menorrhagia   . GERD (gastroesophageal reflux disease)    Past Surgical History  Procedure Laterality Date  . Uterine ablation    . Polypectomy      uterus  . Tubal ligation    . Vaginal hysterectomy      fibroids  . Esophagogastroduodenoscopy  12/21/2008  . Colonoscopy  10/19/2005  . Cesarean section    . Hysteroscopy w/d&c      resection of endometrial polyp  . Breast lumpectomy      left, benign hamartoma   Current Outpatient Prescriptions on File Prior to Visit  Medication Sig Dispense Refill  . fluticasone (FLONASE) 50 MCG/ACT nasal spray PLACE 2 SPRAYS INTO THE NOSE AT BEDTIME. 16 g 4  . losartan-hydrochlorothiazide (HYZAAR) 100-12.5 MG per tablet TAKE 1 TABLET BY MOUTH EVERY DAY 90 tablet 1  . omeprazole (PRILOSEC) 40 MG capsule TAKE 1 CAPSULE (40 MG TOTAL) BY MOUTH DAILY. 30 capsule 11  . phentermine (ADIPEX-P) 37.5 MG tablet Take 1 tablet (37.5 mg total) by mouth daily before breakfast. 30 tablet 2  . potassium chloride SA (KLOR-CON M20) 20 MEQ tablet TAKE 1 TABLET EVERY DAY 30 tablet 3  . Probiotic Product (PROBIOTIC DAILY PO) Take 1 tablet by mouth daily.     No current facility-administered medications on file prior to visit.   No Known Allergies History   Social History  . Marital Status: Married    Spouse Name: Montine Circle  . Number of Children: 2  . Years of Education: 16   Occupational History  . QUALITY ANALYST    Social History Main Topics  . Smoking status: Never Smoker   . Smokeless tobacco: Never Used  . Alcohol Use: Yes     Comment: socially  .  Drug Use: No  . Sexual Activity: Not on file   Other Topics Concern  . Not on file   Social History Narrative   Patient is right handed and consumes green tea daily      Review of Systems  All other systems reviewed and are negative.      Objective:   Physical Exam  Cardiovascular: Normal rate, regular rhythm and normal heart sounds.   Pulmonary/Chest: Effort normal and breath sounds normal.  Musculoskeletal:       Left shoulder: She exhibits decreased range of motion, tenderness, pain and spasm. She exhibits no effusion, no crepitus, no deformity and normal strength.  Vitals reviewed.         Assessment & Plan:  Prediabetes - Plan: COMPLETE METABOLIC PANEL WITH GFR, Hemoglobin A1c  Subacromial bursitis, left  I believe the patient has subacromial bursitis in her left shoulder. Using sterile technique, I injected her left shoulder with a mixture of 2 mL of lidocaine, 2 mL of Marcaine, and 2 mL of 40 mg per mL Kenalog area the patient tolerated the procedure well without complication. While she is here on Monday check a CMP as well as a hemoglobin A1c to monitor her prediabetes.

## 2014-07-07 ENCOUNTER — Encounter: Payer: Self-pay | Admitting: Family Medicine

## 2014-07-23 LAB — HM MAMMOGRAPHY: HM Mammogram: 5

## 2014-08-13 ENCOUNTER — Telehealth: Payer: Self-pay | Admitting: Family Medicine

## 2014-08-13 MED ORDER — CIPROFLOXACIN HCL 500 MG PO TABS: 500.0000 mg | ORAL_TABLET | Freq: Two times a day (BID) | ORAL | Status: DC

## 2014-08-13 NOTE — Telephone Encounter (Signed)
Patient called in requesting an appointment for UTI. I spoke with Dr. Dennard Schaumann he said he will call patient in something. [facility] uses [facility] on [address]

## 2014-08-13 NOTE — Telephone Encounter (Signed)
Tried to call patient. No answer, left message.  Rx to pharmacy

## 2014-08-13 NOTE — Telephone Encounter (Signed)
Done see other phone note

## 2014-08-13 NOTE — Telephone Encounter (Signed)
-----   Message from Susy Frizzle, MD sent at 08/13/2014  2:08 PM EDT ----- Has uti, can we call out cipro 500 bid for 3 days

## 2014-08-13 NOTE — Telephone Encounter (Signed)
Please call out cipro 500 bid for 3 days

## 2014-08-19 ENCOUNTER — Encounter: Payer: Self-pay | Admitting: Family Medicine

## 2014-09-02 ENCOUNTER — Other Ambulatory Visit: Payer: Self-pay | Admitting: Family Medicine

## 2014-10-01 ENCOUNTER — Other Ambulatory Visit: Payer: Self-pay | Admitting: Family Medicine

## 2014-10-28 ENCOUNTER — Other Ambulatory Visit: Payer: Self-pay | Admitting: Family Medicine

## 2014-10-28 NOTE — Telephone Encounter (Signed)
Refill appropriate and filled per protocol. 

## 2014-11-15 ENCOUNTER — Ambulatory Visit: Payer: 59 | Admitting: Family Medicine

## 2014-11-16 ENCOUNTER — Ambulatory Visit (INDEPENDENT_AMBULATORY_CARE_PROVIDER_SITE_OTHER): Payer: 59 | Admitting: Family Medicine

## 2014-11-16 ENCOUNTER — Encounter: Payer: Self-pay | Admitting: Family Medicine

## 2014-11-16 VITALS — BP 110/76 | HR 82 | Temp 98.3°F | Resp 18 | Ht 61.5 in | Wt 268.0 lb

## 2014-11-16 DIAGNOSIS — G8929 Other chronic pain: Secondary | ICD-10-CM

## 2014-11-16 DIAGNOSIS — M25512 Pain in left shoulder: Secondary | ICD-10-CM | POA: Diagnosis not present

## 2014-11-16 NOTE — Progress Notes (Signed)
Subjective:    Patient ID: Patricia [name], female    DOB: [birth year], 49 y.o.   MRN: [id number]  HPI 06/2014 3 weeks of shoulder pain in her left shoulder. Worse with abduction greater than 110. Internal rotation. Aches and throbs at night. Denies any injury.  At that time, my plan was:  I believe the patient has subacromial bursitis in her left shoulder. Using sterile technique, I injected her left shoulder with a mixture of 2 mL of lidocaine, 2 mL of Marcaine, and 2 mL of 40 mg per mL Kenalog area the patient tolerated the procedure well without complication. While she is here on Monday check a CMP as well as a hemoglobin A1c to monitor her prediabetes.  11/16/14 Patient received 1-2 months of benefit from cortisone injection but pain has returned with just as much severity and is particulalry worse with abduction greater than 90 degrees. Past Medical History  Diagnosis Date  . Kidney stones   . IBS (irritable bowel syndrome)   . Obesity   . Hypertension   . Chronic cervicitis   . Menorrhagia   . GERD (gastroesophageal reflux disease)    Past Surgical History  Procedure Laterality Date  . Uterine ablation    . Polypectomy      uterus  . Tubal ligation    . Vaginal hysterectomy      fibroids  . Esophagogastroduodenoscopy  12/21/2008  . Colonoscopy  10/19/2005  . Cesarean section    . Hysteroscopy w/d&c      resection of endometrial polyp  . Breast lumpectomy      left, benign hamartoma   Current Outpatient Prescriptions on File Prior to Visit  Medication Sig Dispense Refill  . fluticasone (FLONASE) 50 MCG/ACT nasal spray PLACE 2 SPRAYS INTO THE NOSE AT BEDTIME. 16 g 4  . KLOR-CON M20 20 MEQ tablet TAKE 1 TABLET EVERY DAY 30 tablet 3  . losartan-hydrochlorothiazide (HYZAAR) 100-12.5 MG per tablet TAKE 1 TABLET BY MOUTH EVERY DAY 90 tablet 0  . omeprazole (PRILOSEC) 40 MG capsule TAKE 1 CAPSULE BY MOUTH ONCE A DAY 30 capsule 11  . Probiotic Product (PROBIOTIC DAILY PO) Take  1 tablet by mouth daily.     No current facility-administered medications on file prior to visit.   No Known Allergies Social History   Social History  . Marital Status: Married    Spouse Name: Montine Circle  . Number of Children: 2  . Years of Education: 16   Occupational History  . QUALITY ANALYST    Social History Main Topics  . Smoking status: Never Smoker   . Smokeless tobacco: Never Used  . Alcohol Use: Yes     Comment: socially  . Drug Use: No  . Sexual Activity: Not on file   Other Topics Concern  . Not on file   Social History Narrative   Patient is right handed and consumes green tea daily      Review of Systems  All other systems reviewed and are negative.      Objective:   Physical Exam  Cardiovascular: Normal rate, regular rhythm and normal heart sounds.   Pulmonary/Chest: Effort normal and breath sounds normal.  Musculoskeletal:       Left shoulder: She exhibits decreased range of motion, tenderness, pain and spasm. She exhibits no effusion, no crepitus, no deformity and normal strength.  Vitals reviewed.         Assessment & Plan:  Chronic left shoulder pain - Plan: MR  Shoulder Left Wo Contrast I suspect tendonitis and impingement syndrome  that would benefit from cortisone injection and PT however we will obtain MRI to rule out partial tear first.

## 2014-11-17 ENCOUNTER — Other Ambulatory Visit: Payer: Self-pay | Admitting: Family Medicine

## 2014-11-17 DIAGNOSIS — M25512 Pain in left shoulder: Principal | ICD-10-CM

## 2014-11-17 DIAGNOSIS — G8929 Other chronic pain: Secondary | ICD-10-CM

## 2014-11-25 ENCOUNTER — Other Ambulatory Visit: Payer: Self-pay | Admitting: Family Medicine

## 2014-12-02 ENCOUNTER — Encounter (HOSPITAL_COMMUNITY): Payer: Self-pay

## 2014-12-02 ENCOUNTER — Ambulatory Visit (HOSPITAL_COMMUNITY): Payer: 59 | Attending: Family Medicine

## 2014-12-02 DIAGNOSIS — M629 Disorder of muscle, unspecified: Secondary | ICD-10-CM | POA: Insufficient documentation

## 2014-12-02 DIAGNOSIS — M25512 Pain in left shoulder: Secondary | ICD-10-CM | POA: Insufficient documentation

## 2014-12-02 DIAGNOSIS — R29898 Other symptoms and signs involving the musculoskeletal system: Secondary | ICD-10-CM | POA: Diagnosis present

## 2014-12-02 DIAGNOSIS — M25612 Stiffness of left shoulder, not elsewhere classified: Secondary | ICD-10-CM | POA: Insufficient documentation

## 2014-12-02 DIAGNOSIS — M6289 Other specified disorders of muscle: Secondary | ICD-10-CM

## 2014-12-02 NOTE — Patient Instructions (Signed)
  Flexibility: Corner Stretch   Standing in corner with hands just above shoulder level and approx. 2 feet from corner, lean forward until a comfortable stretch is felt across chest. Hold _10___ seconds. Repeat __3__ times per set.   Scapular Retraction (Standing)   With arms at sides, pinch shoulder blades together. Repeat _10___ times per set. Do _1___ sets per session.  ROM: Towel Stretch - with Interior Rotation   Pull left arm up behind back by pulling towel up with other arm. Hold _10___ seconds. Repeat __3__ times per set.   http://orth.exer.us/888   Copyright  VHI. All rights reserved.   Posterior Capsule Stretch   Stand or sit, one arm across body so hand rests over opposite shoulder. Gently push on crossed elbow with other hand until stretch is felt in shoulder of crossed arm. Hold _10__ seconds.  Repeat _3__ times per session.   Copyright  VHI. All rights reserved.   Flexors Stretch, Standing   Stand near wall and slide arm up, with palm on the wall, by leaning toward wall. Hold _10__ seconds.  Repeat _3__ times per session.   Copyright  VHI. All rights reserved.  (Home) Extension: Isometric / Bilateral Arm Retraction - Sitting   Facing anchor, hold hands and elbow at shoulder height, with elbow bent.  Pull arms back to squeeze shoulder blades together. Repeat 10-15 times.  Copyright  VHI. All rights reserved.   (Home) Retraction: Row - Bilateral (Anchor)   Facing anchor, arms reaching forward, pull hands toward stomach, keeping elbows bent and at your sides and pinching shoulder blades together. Repeat 10-15 times.  Copyright  VHI. All rights reserved.   (Clinic) Extension / Flexion (Assist)   Face anchor, pull arms back, keeping elbow straight, and squeze shoulder blades together. Repeat 10-15 times.   Copyright  VHI. All rights reserved.

## 2014-12-02 NOTE — Therapy (Signed)
[place], [place], [address] Phone: [phone]   Fax:  [phone]  Occupational Therapy Evaluation  Patient Details  Name: Patricia [name] MRN: [id number] Date of Birth: 04/14/1965 Referring Provider:  Susy Frizzle, MD  Encounter Date: 12/02/2014      OT End of Session - 12/02/14 0925    Visit Number 1   Number of Visits 12   Date for OT Re-Evaluation 01/31/15  mini reassess: 12/30/14   Authorization Type U[employer]   Authorization Time Period Visit limit: 60 per calender year for PT/OT/SP. 0 used this year.   Authorization - Visit Number 1   Authorization - Number of Visits 44   OT Start Time 0840   OT Stop Time 0925   OT Time Calculation (min) 45 min   Activity Tolerance Patient tolerated treatment well   Behavior During Therapy [facility] for tasks assessed/performed      Past Medical History  Diagnosis Date  . Kidney stones   . IBS (irritable bowel syndrome)   . Obesity   . Hypertension   . Chronic cervicitis   . Menorrhagia   . GERD (gastroesophageal reflux disease)     Past Surgical History  Procedure Laterality Date  . Uterine ablation    . Polypectomy      uterus  . Tubal ligation    . Vaginal hysterectomy      fibroids  . Esophagogastroduodenoscopy  12/21/2008  . Colonoscopy  10/19/2005  . Cesarean section    . Hysteroscopy w/d&c      resection of endometrial polyp  . Breast lumpectomy      left, benign hamartoma    There were no vitals filed for this visit.  Visit Diagnosis:  Shoulder pain, left - Plan: Ot plan of care cert/re-cert  Shoulder stiffness, left - Plan: Ot plan of care cert/re-cert  Tight fascia - Plan: Ot plan of care cert/re-cert  Shoulder weakness - Plan: Ot plan of care cert/re-cert      Subjective Assessment - 12/02/14 0920    Subjective  S: I don't know if I just aggrevated my shoulder after Zumba. It started bothering me and just wouldn't stop.   Pertinent  History Patient is a 49 y/o female s/o left chronic shoulder pain which began March 2016 after patient participated in [place] class. Patient received a cortisone shot 07/02/14 which relieved the pain for a month. Pain has since returned. Pt is to complete 6 weeks of therapy before insurance will cover a MRI. Dr. Dennard Schaumann has referred patient to occupational therapy for evaluation and treatment.    Special Tests FOTO score: 61/100   Patient Stated Goals To have no arm pain   Currently in Pain? 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    ADL   ADL comments Difficulty reaching back or pushing back. Push off with arm from the chair. A little difficulty reaching overhead.   Mobility   Mobility Status Independent   Written Expression   Dominant Hand Right   Vision - History   Baseline Vision Wears glasses all the time   Cognition   Overall Cognitive Status Within Functional Limits for tasks assessed   ROM / Strength   AROM / PROM / Strength AROM;PROM;Strength   Palpation   Palpation comment max fascial restrictions in left deltoid, and trapezius regions.   AROM   Overall AROM Comments Assessed IR/ER abducted.   AROM Assessment Site Shoulder   Right/Left Shoulder Left   Left Shoulder Flexion 150 Degrees  with pain   Left Shoulder  ABduction 160 Degrees  little pain   Left Shoulder Internal Rotation 70 Degrees   Left Shoulder External Rotation 90 Degrees   PROM   Overall PROM  Within functional limits for tasks performed   PROM Assessment Site Shoulder   Right/Left Shoulder Left   Strength   Overall Strength Comments Assessed seated. IR/ER abducted.   Strength Assessment Site Shoulder   Right/Left Shoulder Left   Left Shoulder Flexion 4+/5   Left Shoulder ABduction 4+/5   Left Shoulder Internal Rotation 4/5   Left Shoulder External Rotation 4/5                         OT Education - 12/02/14 0924    Education provided Yes   Education Details shoulder stretches and scapular strengthening with green theraband   Person(s) Educated Patient   Methods Explanation;Demonstration;Handout   Comprehension Returned demonstration;Verbalized understanding          OT Short Term Goals - 12/02/14 0950    OT SHORT TERM GOAL #1   Title Patient will be educated and independent with HEP.   Time 3   Period Weeks   Status New   OT SHORT TERM GOAL #2   Title Patient will increase AROM of LUE to WNL to increase ability to complete overhead activities with less difficulty.    Time 3   Period Weeks   Status New   OT SHORT TERM GOAL #3   Title Patient will decrease fascial restrictions to Mod amount.    Time 3   Period Weeks   Status New   OT SHORT TERM GOAL #4   Title Patient will decrease pain level when waking up in the morning to a 5/10.   Time 3   Period Weeks   Status New           OT Long Term Goals - 12/02/14 4098    OT LONG TERM GOAL #1   Title Patient will return to highest level independence with all daily and work related tasks.   Time 6   Period Weeks   Status New   OT LONG TERM GOAL #2   Title Patient will increase LUE shoulder strength to 5/5 to increase ability to complete heavy household tasks.   Time 6   Period Weeks   Status New   OT LONG TERM GOAL #3   Title Patient  will decrease fascial restrictions to min amount.    Time 6   Period Weeks   Status New   OT  LONG TERM GOAL #4   Title Patient will decrease pain level to 2/10 or less during daily tasks to increase ability to return to zumba.   Time 6   Period Weeks   Status New               Plan - 12/02/14 0926    Clinical Impression Statement A: Patient is a49 y/o female s/p left chronic shoulder pain causing increased pain and fascial restrictions and decreased strength and ROM resulting in difficulty completing daily and work related tasks using LUE.   Pt will benefit from skilled therapeutic intervention in order to improve on the following deficits (Retired) Decreased strength;Pain;Increased fascial restricitons;Decreased range of motion   Rehab Potential Excellent   OT Frequency 2x / week   OT Duration 6 weeks   OT Treatment/Interventions Self-care/ADL training;Ultrasound;Passive range of motion;Patient/family education;Cryotherapy;Electrical Stimulation;Moist Heat;Neuromuscular education;Therapeutic activities;Therapeutic exercises;Manual Therapy   Plan P: Pt will benefit from skilled OT services to increase functional performance when using LUE for daily and work related tasks. Treatment Plan: Myofascial release, muscle energy technique, passive stretching, general strengthening, scapular strengthening to increase posture, and work Lexicographer.   Consulted and Agree with Plan of Care Patient        Problem List Patient Active Problem List   Diagnosis Date Noted  . Palpitations 07/07/2013  . Acute sinusitis 04/21/2013  . Obstructive sleep apnea (adult) (pediatric) 08/25/2012  . Hypersomnia with sleep apnea, unspecified 08/25/2012  . Bilateral lower extremity edema 11/27/2011  . Obesity, Class III, BMI 40-49.9 (morbid obesity) 10/26/2011  . HYPERTENSION, UNSPECIFIED 11/28/2008  . IRRITABLE BOWEL SYNDROME 11/10/2008    Ailene Ravel, OTR/L,CBIS   [phone]  12/02/2014, 9:56 AM  [facility] [address] [place], [place], [address] Phone: [phone]   Fax:  [phone]

## 2014-12-09 ENCOUNTER — Ambulatory Visit (HOSPITAL_COMMUNITY): Payer: 59 | Admitting: Occupational Therapy

## 2014-12-09 ENCOUNTER — Encounter (HOSPITAL_COMMUNITY): Payer: Self-pay | Admitting: Occupational Therapy

## 2014-12-09 DIAGNOSIS — M25612 Stiffness of left shoulder, not elsewhere classified: Secondary | ICD-10-CM

## 2014-12-09 DIAGNOSIS — R29898 Other symptoms and signs involving the musculoskeletal system: Secondary | ICD-10-CM

## 2014-12-09 DIAGNOSIS — M25512 Pain in left shoulder: Secondary | ICD-10-CM

## 2014-12-09 DIAGNOSIS — M6289 Other specified disorders of muscle: Secondary | ICD-10-CM

## 2014-12-09 DIAGNOSIS — M629 Disorder of muscle, unspecified: Secondary | ICD-10-CM

## 2014-12-09 NOTE — Therapy (Signed)
[place], [place], [address] Phone: [phone]   Fax:  [phone]  Occupational Therapy Treatment  Patient Details  Name: Patricia [name] MRN: [id number] Date of Birth: 05/17/1965 Referring Provider:  Susy Frizzle, MD  Encounter Date: 12/09/2014      OT End of Session - 12/09/14 1031    Visit Number 2   Number of Visits 12   Date for OT Re-Evaluation 01/31/15  mini reassess: 12/30/14   Authorization Type UHC   Authorization Time Period Visit limit: 60 per calender year for PT/OT/SP. 0 used this year.   Authorization - Visit Number 2   Authorization - Number of Visits 38   OT Start Time [phone]   OT Stop Time 1014   OT Time Calculation (min) 38 min   Activity Tolerance Patient tolerated treatment well   Behavior During Therapy WFL for tasks assessed/performed      Past Medical History  Diagnosis Date  . Kidney stones   . IBS (irritable bowel syndrome)   . Obesity   . Hypertension   . Chronic cervicitis   . Menorrhagia   . GERD (gastroesophageal reflux disease)     Past Surgical History  Procedure Laterality Date  . Uterine ablation    . Polypectomy      uterus  . Tubal ligation    . Vaginal hysterectomy      fibroids  . Esophagogastroduodenoscopy  12/21/2008  . Colonoscopy  10/19/2005  . Cesarean section    . Hysteroscopy w/d&c      resection of endometrial polyp  . Breast lumpectomy      left, benign hamartoma    There were no vitals filed for this visit.  Visit Diagnosis:  Shoulder pain, left  Shoulder stiffness, left  Tight fascia  Shoulder weakness      Subjective Assessment - 12/09/14 1030    Subjective  S: It's feeling much better than it did last time I was here.    Currently in Pain? Yes   Pain Score 2    Pain Location Shoulder   Pain Orientation Left   Pain Descriptors / Indicators Aching   Pain Type Chronic pain            [facility] OT Assessment - 12/09/14  1030    Assessment   Diagnosis Left shoulder pain   Precautions   Precautions None                  OT Treatments/Exercises (OP) - 12/09/14 0956    Exercises   Exercises Shoulder   Shoulder Exercises: Supine   Protraction PROM;5 reps;AROM;12 reps   Horizontal ABduction PROM;5 reps;AROM;12 reps   External Rotation PROM;5 reps;AROM;12 reps   Internal Rotation PROM;5 reps;AROM;12 reps   Flexion PROM;5 reps;AROM;12 reps   ABduction PROM;5 reps;AROM;12 reps   Shoulder Exercises: Standing   Protraction AROM;10 reps   Horizontal ABduction AROM;10 reps   External Rotation AROM;10 reps   Internal Rotation AROM;10 reps   Flexion AROM;10 reps   ABduction AROM;10 reps   Extension Theraband;10 reps   Theraband Level (Shoulder Extension) Level 2 (Red)   Row Theraband;10 reps   Theraband Level (Shoulder Row) Level 2 (Red)   Retraction Theraband;10 reps   Theraband Level (Shoulder Retraction) Level 2 (Red)   Shoulder Exercises: ROM/Strengthening   Wall Wash 1'   Proximal Shoulder Strengthening, Supine 12X each no rest breaks   Proximal Shoulder Strengthening, Seated 10X each no  rest breaks   Prot/Ret//Elev/Dep 1'   Manual Therapy   Manual Therapy Myofascial release   Myofascial Release Myofascial release to left upper arm, deltoid, and trapezius regions to decrease pain and fascial restrictions and increase joint mobility                  OT Short Term Goals - 12/09/14 1033    OT SHORT TERM GOAL #1   Title Patient will be educated and independent with HEP.   Time 3   Period Weeks   Status On-going   OT SHORT TERM GOAL #2   Title Patient will increase AROM of LUE to WNL to increase ability to complete overhead activities with less difficulty.    Time 3   Period Weeks   Status On-going   OT SHORT TERM GOAL #3   Title Patient will decrease fascial restrictions to Mod amount.    Time 3   Period Weeks   Status On-going   OT SHORT TERM GOAL #4   Title Patient  will decrease pain level when waking up in the morning to a 5/10.   Time 3   Period Weeks   Status On-going           OT Long Term Goals - 12/09/14 1033    OT LONG TERM GOAL #1   Title Patient will return to highest level independence with all daily and work related tasks.   Time 6   Period Weeks   Status On-going   OT LONG TERM GOAL #2   Title Patient will increase LUE shoulder strength to 5/5 to increase ability to complete heavy household tasks.   Time 6   Period Weeks   Status On-going   OT LONG TERM GOAL #3   Title Patient will decrease fascial restrictions to min amount.    Time 6   Period Weeks   Status On-going   OT LONG TERM GOAL #4   Title Patient will decrease pain level to 2/10 or less during daily tasks to increase ability to return to zumba.   Time 6   Period Weeks   Status On-going               Plan - 12/09/14 1031    Clinical Impression Statement A: Initiated myofascial release, PROM, AROM exercises this session. Pt tolerated exercises well, reporting fatigue at end of session. Pt has been completing her HEP, reporting she thinks it is helping.    Plan P: Add rhythmic stabilization exercises in supine-60, 90, 120. Provide copy of evaluation & review.         Problem List Patient Active Problem List   Diagnosis Date Noted  . Palpitations 07/07/2013  . Acute sinusitis 04/21/2013  . Obstructive sleep apnea (adult) (pediatric) 08/25/2012  . Hypersomnia with sleep apnea, unspecified 08/25/2012  . Bilateral lower extremity edema 11/27/2011  . Obesity, Class III, BMI 40-49.9 (morbid obesity) 10/26/2011  . HYPERTENSION, UNSPECIFIED 11/28/2008  . IRRITABLE BOWEL SYNDROME 11/10/2008    Guadelupe Sabin, OTR/L  [phone]  12/09/2014, 10:34 AM  [facility] [address] [place], [place], [address] Phone: [phone]   Fax:  [phone]

## 2014-12-13 ENCOUNTER — Ambulatory Visit (HOSPITAL_COMMUNITY): Payer: 59 | Admitting: Specialist

## 2014-12-13 DIAGNOSIS — M25512 Pain in left shoulder: Secondary | ICD-10-CM | POA: Diagnosis not present

## 2014-12-13 DIAGNOSIS — M25612 Stiffness of left shoulder, not elsewhere classified: Secondary | ICD-10-CM

## 2014-12-13 NOTE — Therapy (Signed)
[place], [place], [address] Phone: [phone]   Fax:  [phone]  Occupational Therapy Treatment  Patient Details  Name: Patricia [name] MRN: [id number] Date of Birth: May 26, 1965 Referring Provider:  Susy Frizzle, MD  Encounter Date: 12/13/2014      OT End of Session - 12/13/14 1130    Visit Number 3   Number of Visits 12   Date for OT Re-Evaluation 01/31/15  mini reassess on 12/30/14   Authorization Type UHC   Authorization Time Period Visit limit: 60 per calender year for PT/OT/SP. 0 used this year.   Authorization - Visit Number 3   Authorization - Number of Visits 64   OT Start Time 4034   OT Stop Time 1105   OT Time Calculation (min) 42 min   Activity Tolerance Patient tolerated treatment well   Behavior During Therapy WFL for tasks assessed/performed      Past Medical History  Diagnosis Date  . Kidney stones   . IBS (irritable bowel syndrome)   . Obesity   . Hypertension   . Chronic cervicitis   . Menorrhagia   . GERD (gastroesophageal reflux disease)     Past Surgical History  Procedure Laterality Date  . Uterine ablation    . Polypectomy      uterus  . Tubal ligation    . Vaginal hysterectomy      fibroids  . Esophagogastroduodenoscopy  12/21/2008  . Colonoscopy  10/19/2005  . Cesarean section    . Hysteroscopy w/d&c      resection of endometrial polyp  . Breast lumpectomy      left, benign hamartoma    There were no vitals filed for this visit.  Visit Diagnosis:  Shoulder pain, left  Shoulder stiffness, left      Subjective Assessment - 12/13/14 1024    Subjective  S:  I can tell its feeling better.   Currently in Pain? Yes   Pain Score 4    Pain Location Shoulder   Pain Orientation Left   Pain Descriptors / Indicators Aching            [facility] OT Assessment - 12/13/14 0001    Assessment   Diagnosis Left shoulder pain   Prior Therapy None                   OT Treatments/Exercises (OP) - 12/13/14 0001    Exercises   Exercises Shoulder   Shoulder Exercises: Supine   Protraction PROM;5 reps;AROM;15 reps   Horizontal ABduction PROM;5 reps;AROM;15 reps   External Rotation PROM;5 reps;AROM;15 reps   Internal Rotation PROM;5 reps;AROM;15 reps   Flexion PROM;5 reps;AROM;15 reps   ABduction PROM;5 reps;AROM;15 reps   Other Supine Exercises serratus anterior punch 15 times   Shoulder Exercises: Seated   Protraction AROM;12 reps   Horizontal ABduction AROM;12 reps   External Rotation AROM;12 reps   Internal Rotation AROM;12 reps   Flexion AROM;12 reps   Abduction AROM;12 reps   Shoulder Exercises: Standing   Extension Weights;15 reps   Theraband Level (Shoulder Extension) Level 2 (Red)   Row Theraband;15 reps   Theraband Level (Shoulder Row) Level 2 (Red)   Retraction Theraband;15 reps   Theraband Level (Shoulder Retraction) Level 2 (Red)   Shoulder Exercises: ROM/Strengthening   UBE (Upper Arm Bike) 2' reverse at 1.0 to encourage retraction   Proximal Shoulder Strengthening, Supine 12X each no rest breaks   Rhythmic Stabilization, Supine  15" at 90, 45, 130 degrees of flexion   Manual Therapy   Manual Therapy Myofascial release   Myofascial Release Myofascial release to left upper arm, deltoid, and trapezius regions to decrease pain and fascial restrictions and increase joint mobility                  OT Short Term Goals - 12/09/14 1033    OT SHORT TERM GOAL #1   Title Patient will be educated and independent with HEP.   Time 3   Period Weeks   Status On-going   OT SHORT TERM GOAL #2   Title Patient will increase AROM of LUE to WNL to increase ability to complete overhead activities with less difficulty.    Time 3   Period Weeks   Status On-going   OT SHORT TERM GOAL #3   Title Patient will decrease fascial restrictions to Mod amount.    Time 3   Period Weeks   Status On-going   OT SHORT  TERM GOAL #4   Title Patient will decrease pain level when waking up in the morning to a 5/10.   Time 3   Period Weeks   Status On-going           OT Long Term Goals - 12/09/14 1033    OT LONG TERM GOAL #1   Title Patient will return to highest level independence with all daily and work related tasks.   Time 6   Period Weeks   Status On-going   OT LONG TERM GOAL #2   Title Patient will increase LUE shoulder strength to 5/5 to increase ability to complete heavy household tasks.   Time 6   Period Weeks   Status On-going   OT LONG TERM GOAL #3   Title Patient will decrease fascial restrictions to min amount.    Time 6   Period Weeks   Status On-going   OT LONG TERM GOAL #4   Title Patient will decrease pain level to 2/10 or less during daily tasks to increase ability to return to zumba.   Time 6   Period Weeks   Status On-going               Plan - 12/13/14 1131    Clinical Impression Statement A:  pateint demonstated good form with AROM exercises in supine and seated this date.  Required vg for proper postiioning with theraband exercises.  added rhythmic stabilization this date.  Min difficulty maintaining positioning.   Plan P:  Add sidelying AROM focusing on scapular retraction while completing.        Problem List Patient Active Problem List   Diagnosis Date Noted  . Palpitations 07/07/2013  . Acute sinusitis 04/21/2013  . Obstructive sleep apnea (adult) (pediatric) 08/25/2012  . Hypersomnia with sleep apnea, unspecified 08/25/2012  . Bilateral lower extremity edema 11/27/2011  . Obesity, Class III, BMI 40-49.9 (morbid obesity) 10/26/2011  . HYPERTENSION, UNSPECIFIED 11/28/2008  . IRRITABLE BOWEL SYNDROME 11/10/2008    Vangie Bicker, OTR/L [phone]  12/13/2014, 11:34 AM  [facility] [address] [place], [place], [address] Phone: [phone]   Fax:  [phone]

## 2014-12-15 ENCOUNTER — Ambulatory Visit (HOSPITAL_COMMUNITY): Payer: 59

## 2014-12-15 ENCOUNTER — Encounter (HOSPITAL_COMMUNITY): Payer: Self-pay

## 2014-12-15 DIAGNOSIS — R29898 Other symptoms and signs involving the musculoskeletal system: Secondary | ICD-10-CM

## 2014-12-15 DIAGNOSIS — M629 Disorder of muscle, unspecified: Secondary | ICD-10-CM

## 2014-12-15 DIAGNOSIS — M25612 Stiffness of left shoulder, not elsewhere classified: Secondary | ICD-10-CM

## 2014-12-15 DIAGNOSIS — M25512 Pain in left shoulder: Secondary | ICD-10-CM | POA: Diagnosis not present

## 2014-12-15 DIAGNOSIS — M6289 Other specified disorders of muscle: Secondary | ICD-10-CM

## 2014-12-15 NOTE — Therapy (Signed)
[facility] [address] [place], [place], [address] Phone: [phone]   Fax:  [phone]  Occupational Therapy Treatment  Patient Details  Name: Patricia [name] MRN: [id number] Date of Birth: [birth year] Referring Provider:  Donita Brooks, MD  Encounter Date: 12/15/2014      OT End of Session - 12/15/14 1015    Visit Number 4   Number of Visits 12   Date for OT Re-Evaluation 01/31/15  mini reassess on 12/30/14   Authorization Type [employer]   Authorization Time Period Visit limit: 60 per calender year for PT/OT/SP. 0 used this year.   Authorization - Visit Number 4   Authorization - Number of Visits 60   OT Start Time 0930   OT Stop Time 1016   OT Time Calculation (min) 46 min   Activity Tolerance Patient tolerated treatment well   Behavior During Therapy WFL for tasks assessed/performed      Past Medical History  Diagnosis Date  . Kidney stones   . IBS (irritable bowel syndrome)   . Obesity   . Hypertension   . Chronic cervicitis   . Menorrhagia   . GERD (gastroesophageal reflux disease)     Past Surgical History  Procedure Laterality Date  . Uterine ablation    . Polypectomy      uterus  . Tubal ligation    . Vaginal hysterectomy      fibroids  . Esophagogastroduodenoscopy  12/21/2008  . Colonoscopy  10/19/2005  . Cesarean section    . Hysteroscopy w/d&c      resection of endometrial polyp  . Breast lumpectomy      left, benign hamartoma    There were no vitals filed for this visit.  Visit Diagnosis:  Shoulder stiffness, left  Tight fascia  Shoulder weakness      Subjective Assessment - 12/15/14 0948    Subjective  S: I hurt the worse when I wake up.   Currently in Pain? No/denies            [facility] OT Assessment - 12/15/14 0948    Assessment   Diagnosis Left shoulder pain   Precautions   Precautions None                  OT Treatments/Exercises (OP) - 12/15/14 0949    Exercises    Exercises Shoulder   Shoulder Exercises: Supine   Protraction PROM;5 reps;AROM;15 reps   Horizontal ABduction PROM;5 reps;AROM;15 reps   External Rotation PROM;5 reps;AROM;15 reps   Internal Rotation PROM;5 reps;AROM;15 reps   Flexion PROM;5 reps;AROM;15 reps   ABduction PROM;5 reps;AROM;15 reps   Shoulder Exercises: Prone   Other Prone Exercises Scapular raises; 10X; A/ROM   Shoulder Exercises: Sidelying   External Rotation AROM;15 reps   Internal Rotation AROM;15 reps   Flexion AROM;15 reps   ABduction AROM;15 reps   Other Sidelying Exercises Shoulder retraction; 15X; A/ROM   Shoulder Exercises: Standing   Protraction AROM;15 reps   Horizontal ABduction AROM;15 reps   External Rotation AROM;15 reps   Internal Rotation AROM;15 reps   Flexion AROM;15 reps   ABduction AROM;15 reps   Extension Theraband;15 reps   Theraband Level (Shoulder Extension) Level 2 (Red)   Row Theraband;15 reps   Theraband Level (Shoulder Row) Level 2 (Red)   Retraction Theraband;15 reps   Theraband Level (Shoulder Retraction) Level 2 (Red)   Shoulder Exercises: ROM/Strengthening   Proximal Shoulder Strengthening, Supine 15X each no rest breaks  Prot/Ret//Elev/Dep 2'   Manual Therapy   Manual Therapy Myofascial release   Myofascial Release Myofascial release to left upper arm, deltoid, and trapezius regions to decrease pain and fascial restrictions and increase joint mobility                  OT Short Term Goals - 12/09/14 1033    OT SHORT TERM GOAL #1   Title Patient will be educated and independent with HEP.   Time 3   Period Weeks   Status On-going   OT SHORT TERM GOAL #2   Title Patient will increase AROM of LUE to WNL to increase ability to complete overhead activities with less difficulty.    Time 3   Period Weeks   Status On-going   OT SHORT TERM GOAL #3   Title Patient will decrease fascial restrictions to Mod amount.    Time 3   Period Weeks   Status On-going   OT  SHORT TERM GOAL #4   Title Patient will decrease pain level when waking up in the morning to a 5/10.   Time 3   Period Weeks   Status On-going           OT Long Term Goals - 12/09/14 1033    OT LONG TERM GOAL #1   Title Patient will return to highest level independence with all daily and work related tasks.   Time 6   Period Weeks   Status On-going   OT LONG TERM GOAL #2   Title Patient will increase LUE shoulder strength to 5/5 to increase ability to complete heavy household tasks.   Time 6   Period Weeks   Status On-going   OT LONG TERM GOAL #3   Title Patient will decrease fascial restrictions to min amount.    Time 6   Period Weeks   Status On-going   OT LONG TERM GOAL #4   Title Patient will decrease pain level to 2/10 or less during daily tasks to increase ability to return to zumba.   Time 6   Period Weeks   Status On-going               Plan - 12/15/14 1016    Clinical Impression Statement A: Completed prone and sidelying exercises with focus on retraction of shoulder. Pt required physcial cues for pro/ret/elev/dep exercise. Pt has increased difficulty with isolating scapula movements.    Plan P; work on increasing independence with scapular mobility exercises.         Problem List Patient Active Problem List   Diagnosis Date Noted  . Palpitations 07/07/2013  . Acute sinusitis 04/21/2013  . Obstructive sleep apnea (adult) (pediatric) 08/25/2012  . Hypersomnia with sleep apnea, unspecified 08/25/2012  . Bilateral lower extremity edema 11/27/2011  . Obesity, Class III, BMI 40-49.9 (morbid obesity) 10/26/2011  . HYPERTENSION, UNSPECIFIED 11/28/2008  . IRRITABLE BOWEL SYNDROME 11/10/2008    Ailene Ravel, OTR/L,CBIS  [phone]  12/15/2014, 12:10 PM  [place] [address] [place], [place], [address] Phone: [phone]   Fax:  [phone]

## 2014-12-21 ENCOUNTER — Encounter (HOSPITAL_COMMUNITY): Payer: 59 | Admitting: Occupational Therapy

## 2014-12-22 ENCOUNTER — Ambulatory Visit (HOSPITAL_COMMUNITY): Payer: 59 | Admitting: Specialist

## 2014-12-22 DIAGNOSIS — M25512 Pain in left shoulder: Secondary | ICD-10-CM | POA: Diagnosis not present

## 2014-12-22 DIAGNOSIS — R29898 Other symptoms and signs involving the musculoskeletal system: Secondary | ICD-10-CM

## 2014-12-22 DIAGNOSIS — M25612 Stiffness of left shoulder, not elsewhere classified: Secondary | ICD-10-CM

## 2014-12-22 NOTE — Therapy (Signed)
[place] [facility] [address] [place], [place], [address] Phone: [phone]   Fax:  [phone]  Occupational Therapy Treatment  Patient Details  Name: Patricia [name] MRN: [id number] Date of Birth: 10/16/1965 Referring Provider:  Pickard, Warren T, MD  Encounter Date: 12/22/2014      OT End of Session - 12/22/14 1042    Visit Number 5   Number of Visits 12   Date for OT Re-Evaluation 01/31/15  mini reassess on 12/30/14   Authorization Type [employer]   Authorization Time Period Visit limit: 60 per calender year for PT/OT/SP. 0 used this year.   Authorization - Visit Number 5   Authorization - Number of Visits 60   OT Start Time 0938   OT Stop Time 1011   OT Time Calculation (min) 33 min   Activity Tolerance Patient tolerated treatment well   Behavior During Therapy WFL for tasks assessed/performed      Past Medical History  Diagnosis Date  . Kidney stones   . IBS (irritable bowel syndrome)   . Obesity   . Hypertension   . Chronic cervicitis   . Menorrhagia   . GERD (gastroesophageal reflux disease)     Past Surgical History  Procedure Laterality Date  . Uterine ablation    . Polypectomy      uterus  . Tubal ligation    . Vaginal hysterectomy      fibroids  . Esophagogastroduodenoscopy  12/21/2008  . Colonoscopy  10/19/2005  . Cesarean section    . Hysteroscopy w/d&c      resection of endometrial polyp  . Breast lumpectomy      left, benign hamartoma    There were no vitals filed for this visit.  Visit Diagnosis:  Shoulder stiffness, left  Shoulder weakness      Subjective Assessment - 12/22/14 0937    Subjective  S:  Im not in pain, and I can move it back further now but not quite back to normal   Currently in Pain? No/denies            [facility] OT Assessment - 12/22/14 0001    Assessment   Diagnosis Left shoulder pain   Prior Therapy None   Precautions   Precautions None                  OT  Treatments/Exercises (OP) - 12/22/14 0001    Exercises   Exercises Shoulder   Shoulder Exercises: Supine   Protraction (p) PROM;Strengthening;10 reps   Protraction Weight (lbs) (p) 1   Horizontal ABduction (p) PROM;Strengthening;10 reps   Horizontal ABduction Weight (lbs) (p) 1   External Rotation (p) PROM;Strengthening;10 reps   External Rotation Weight (lbs) (p) 1   Internal Rotation (p) PROM;Strengthening;10 reps   Internal Rotation Weight (lbs) (p) 1   Flexion (p) PROM;Strengthening;10 reps   Shoulder Flexion Weight (lbs) (p) 1   ABduction (p) PROM;Strengthening;10 reps   Shoulder ABduction Weight (lbs) (p) 1   Shoulder Exercises: Standing   Protraction (p) Strengthening;10 reps   Protraction Weight (lbs) (p) 1   Horizontal ABduction (p) Strengthening;10 reps   Horizontal ABduction Weight (lbs) (p) 1   External Rotation (p) Strengthening;10 reps   External Rotation Weight (lbs) (p) 1   Internal Rotation (p) Strengthening;10 reps   Internal Rotation Weight (lbs) (p) 1   Flexion (p) Strengthening;10 reps   Shoulder Flexion Weight (lbs) (p) 1   ABduction (p) Strengthening;10 reps   Shoulder ABduction   Weight (lbs) (p) 1   Extension (p) Theraband;15 reps   Theraband Level (Shoulder Extension) (p) Level 3 (Green)   Row (p) Theraband;15 reps   Theraband Level (Shoulder Row) (p) Level 3 (Green)   Retraction (p) Theraband;15 reps   Theraband Level (Shoulder Retraction) (p) Level 3 (Green)   Shoulder Exercises: ROM/Strengthening   UBE (Upper Arm Bike) 3' reverse at 2.0 focusing on strengthening shoulder retraction    Wall Wash 1 1/2 minute with one rest break - vg to keep shoulders squared to wall   "W" Arms (p) 10 times   X to V Arms (p) 10 times   Proximal Shoulder Strengthening, Supine (p) 10 times each with 1 # without resting   Proximal Shoulder Strengthening, Seated (p) 10 times with 1# with scapula contacting wall no rest   Prot/Ret//Elev/Dep 1' with improved form   Manual  Therapy   Manual Therapy Myofascial release   Myofascial Release Myofascial release to left upper arm, deltoid, and trapezius regions to decrease pain and fascial restrictions and increase joint mobility                  OT Short Term Goals - 12/09/14 1033    OT SHORT TERM GOAL #1   Title Patient will be educated and independent with HEP.   Time 3   Period Weeks   Status On-going   OT SHORT TERM GOAL #2   Title Patient will increase AROM of LUE to WNL to increase ability to complete overhead activities with less difficulty.    Time 3   Period Weeks   Status On-going   OT SHORT TERM GOAL #3   Title Patient will decrease fascial restrictions to Mod amount.    Time 3   Period Weeks   Status On-going   OT SHORT TERM GOAL #4   Title Patient will decrease pain level when waking up in the morning to a 5/10.   Time 3   Period Weeks   Status On-going           OT Long Term Goals - 12/09/14 1033    OT LONG TERM GOAL #1   Title Patient will return to highest level independence with all daily and work related tasks.   Time 6   Period Weeks   Status On-going   OT LONG TERM GOAL #2   Title Patient will increase LUE shoulder strength to 5/5 to increase ability to complete heavy household tasks.   Time 6   Period Weeks   Status On-going   OT LONG TERM GOAL #3   Title Patient will decrease fascial restrictions to min amount.    Time 6   Period Weeks   Status On-going   OT LONG TERM GOAL #4   Title Patient will decrease pain level to 2/10 or less during daily tasks to increase ability to return to zumba.   Time 6   Period Weeks   Status On-going               Plan - 12/22/14 1042    Clinical Impression Statement A:  Minimal fascial restrictions noted this date.  Added 1# resistance to supine and standing exercises, required vg to maintain depressed shoulder blade.  Improved independence with prot/ret/elev/dep.     Plan P:  Resume prone and sidelying  exercises, add wall push up        Problem List Patient Active Problem List   Diagnosis Date Noted  . Palpitations   07/07/2013  . Acute sinusitis 04/21/2013  . Obstructive sleep apnea (adult) (pediatric) 08/25/2012  . Hypersomnia with sleep apnea, unspecified 08/25/2012  . Bilateral lower extremity edema 11/27/2011  . Obesity, Class III, BMI 40-49.9 (morbid obesity) 10/26/2011  . HYPERTENSION, UNSPECIFIED 11/28/2008  . IRRITABLE BOWEL SYNDROME 11/10/2008   Bethany H. Murray, OTR/L [phone]  12/22/2014, 10:44 AM   [facility] [address] [place], [place], [address] Phone: [phone]   Fax:  [phone]    

## 2014-12-23 ENCOUNTER — Encounter (HOSPITAL_COMMUNITY): Payer: Self-pay

## 2014-12-23 ENCOUNTER — Ambulatory Visit (HOSPITAL_COMMUNITY): Payer: 59

## 2014-12-23 DIAGNOSIS — M25512 Pain in left shoulder: Secondary | ICD-10-CM | POA: Diagnosis not present

## 2014-12-23 DIAGNOSIS — M6289 Other specified disorders of muscle: Secondary | ICD-10-CM

## 2014-12-23 DIAGNOSIS — M629 Disorder of muscle, unspecified: Secondary | ICD-10-CM

## 2014-12-23 DIAGNOSIS — M25612 Stiffness of left shoulder, not elsewhere classified: Secondary | ICD-10-CM

## 2014-12-23 DIAGNOSIS — R29898 Other symptoms and signs involving the musculoskeletal system: Secondary | ICD-10-CM

## 2014-12-23 NOTE — Therapy (Signed)
[place], [place], [address] Phone: [phone]   Fax:  [phone]  Occupational Therapy Treatment  Patient Details  Name: Patricia [name] MRN: [id number] Date of Birth: [birth year] Referring Provider:  Susy Frizzle, MD  Encounter Date: 12/23/2014      OT End of Session - 12/23/14 1102    Visit Number 6   Number of Visits 12   Date for OT Re-Evaluation 01/31/15  mini reassess on 12/30/14   Authorization Type [employer]   Authorization Time Period Visit limit: 60 per calender year for PT/OT/SP. 0 used this year.   Authorization - Visit Number 6   Authorization - Number of Visits 37   OT Start Time 1020   OT Stop Time 1110   OT Time Calculation (min) 50 min   Activity Tolerance Patient tolerated treatment well   Behavior During Therapy WFL for tasks assessed/performed      Past Medical History  Diagnosis Date  . Kidney stones   . IBS (irritable bowel syndrome)   . Obesity   . Hypertension   . Chronic cervicitis   . Menorrhagia   . GERD (gastroesophageal reflux disease)     Past Surgical History  Procedure Laterality Date  . Uterine ablation    . Polypectomy      uterus  . Tubal ligation    . Vaginal hysterectomy      fibroids  . Esophagogastroduodenoscopy  12/21/2008  . Colonoscopy  10/19/2005  . Cesarean section    . Hysteroscopy w/d&c      resection of endometrial polyp  . Breast lumpectomy      left, benign hamartoma    There were no vitals filed for this visit.  Visit Diagnosis:  Shoulder stiffness, left  Shoulder weakness  Tight fascia  Shoulder pain, left      Subjective Assessment - 12/23/14 1042    Subjective  S: Last night my arm was just killing me. And it's still killing me.    Currently in Pain? Yes   Pain Score 7    Pain Location Shoulder   Pain Orientation Left   Pain Descriptors / Indicators Aching   Pain Type Acute pain                      OT  Treatments/Exercises (OP) - 12/23/14 1044    Exercises   Exercises Shoulder   Shoulder Exercises: Supine   Protraction PROM;Strengthening;10 reps   Protraction Weight (lbs) 1   Horizontal ABduction PROM;Strengthening;10 reps   Horizontal ABduction Weight (lbs) 1   External Rotation PROM;Strengthening;10 reps   External Rotation Weight (lbs) 1   Internal Rotation PROM;Strengthening;10 reps   Internal Rotation Weight (lbs) 1   Flexion PROM;Strengthening;10 reps   Shoulder Flexion Weight (lbs) 1   ABduction PROM;Strengthening;10 reps   Shoulder ABduction Weight (lbs) 1   Shoulder Exercises: Standing   Protraction Strengthening;10 reps   Protraction Weight (lbs) 1   Horizontal ABduction Strengthening;10 reps   Horizontal ABduction Weight (lbs) 1   External Rotation Strengthening;10 reps   External Rotation Weight (lbs) 1   Internal Rotation Strengthening;10 reps   Internal Rotation Weight (lbs) 1   Flexion Strengthening;10 reps   Shoulder Flexion Weight (lbs) 1   ABduction Strengthening;10 reps   Shoulder ABduction Weight (lbs) 1   Shoulder Exercises: ROM/Strengthening   X to V Arms 10 times with 1#   Proximal Shoulder Strengthening, Supine 10 times each  with 1 # without resting   Modalities   Modalities Electrical Stimulation;Moist Heat   Moist Heat Therapy   Number Minutes Moist Heat 10 Minutes   Moist Heat Location Shoulder   Electrical Stimulation   Electrical Stimulation Location left deltoid   Electrical Stimulation Action interferential   Electrical Stimulation Parameters 4.4 CV   Electrical Stimulation Goals Pain   Manual Therapy   Manual Therapy Myofascial release   Myofascial Release Myofascial release to left upper arm, deltoid, and trapezius regions to decrease pain and fascial restrictions and increase joint mobility                  OT Short Term Goals - 12/09/14 1033    OT SHORT TERM GOAL #1   Title Patient will be educated and independent with  HEP.   Time 3   Period Weeks   Status On-going   OT SHORT TERM GOAL #2   Title Patient will increase AROM of LUE to WNL to increase ability to complete overhead activities with less difficulty.    Time 3   Period Weeks   Status On-going   OT SHORT TERM GOAL #3   Title Patient will decrease fascial restrictions to Mod amount.    Time 3   Period Weeks   Status On-going   OT SHORT TERM GOAL #4   Title Patient will decrease pain level when waking up in the morning to a 5/10.   Time 3   Period Weeks   Status On-going           OT Long Term Goals - 12/09/14 1033    OT LONG TERM GOAL #1   Title Patient will return to highest level independence with all daily and work related tasks.   Time 6   Period Weeks   Status On-going   OT LONG TERM GOAL #2   Title Patient will increase LUE shoulder strength to 5/5 to increase ability to complete heavy household tasks.   Time 6   Period Weeks   Status On-going   OT LONG TERM GOAL #3   Title Patient will decrease fascial restrictions to min amount.    Time 6   Period Weeks   Status On-going   OT LONG TERM GOAL #4   Title Patient will decrease pain level to 2/10 or less during daily tasks to increase ability to return to zumba.   Time 6   Period Weeks   Status On-going               Plan - 12/23/14 1103    Clinical Impression Statement A: Pt with increased pain since last session. Did continue with weighted strengthening exercises supine and standing. Added ES and Heat at end of session for pain management. At end of session patient reports a 4/10 pain level.   Plan P: Resume prone and sidelying exercises, add wall push up. Complete ES and heat if needed.        Problem List Patient Active Problem List   Diagnosis Date Noted  . Palpitations 07/07/2013  . Acute sinusitis 04/21/2013  . Obstructive sleep apnea (adult) (pediatric) 08/25/2012  . Hypersomnia with sleep apnea, unspecified 08/25/2012  . Bilateral lower  extremity edema 11/27/2011  . Obesity, Class III, BMI 40-49.9 (morbid obesity) 10/26/2011  . HYPERTENSION, UNSPECIFIED 11/28/2008  . IRRITABLE BOWEL SYNDROME 11/10/2008    Ailene Ravel, OTR/L,CBIS  [phone]  12/23/2014, 11:36 AM  [facility] [address]  [place], [place], [address] Phone: [phone]   Fax:  [phone]

## 2014-12-28 ENCOUNTER — Encounter (HOSPITAL_COMMUNITY): Payer: Self-pay

## 2014-12-28 ENCOUNTER — Ambulatory Visit (HOSPITAL_COMMUNITY): Payer: 59 | Attending: Family Medicine

## 2014-12-28 DIAGNOSIS — M629 Disorder of muscle, unspecified: Secondary | ICD-10-CM | POA: Diagnosis present

## 2014-12-28 DIAGNOSIS — R29898 Other symptoms and signs involving the musculoskeletal system: Secondary | ICD-10-CM | POA: Diagnosis present

## 2014-12-28 DIAGNOSIS — M6289 Other specified disorders of muscle: Secondary | ICD-10-CM

## 2014-12-28 DIAGNOSIS — M25612 Stiffness of left shoulder, not elsewhere classified: Secondary | ICD-10-CM | POA: Insufficient documentation

## 2014-12-28 DIAGNOSIS — M25512 Pain in left shoulder: Secondary | ICD-10-CM | POA: Insufficient documentation

## 2014-12-28 NOTE — Therapy (Signed)
[place], [place], [address] Phone: [phone]   Fax:  [phone]  Occupational Therapy Treatment  Patient Details  Name: Patricia [name] MRN: [id number] Date of Birth: 1965-12-22 Referring Provider:  Susy Frizzle, MD  Encounter Date: 12/28/2014      OT End of Session - 12/28/14 0958    Visit Number 7   Number of Visits 12   Date for OT Re-Evaluation 01/31/15  mini reassess on 12/30/14   Authorization Type [employer]   Authorization Time Period Visit limit: 60 per calender year for PT/OT/SP. 0 used this year.   Authorization - Visit Number 7   Authorization - Number of Visits 42   OT Start Time [phone]   OT Stop Time 0940   OT Time Calculation (min) 50 min   Activity Tolerance Patient tolerated treatment well   Behavior During Therapy [facility] for tasks assessed/performed      Past Medical History  Diagnosis Date  . Kidney stones   . IBS (irritable bowel syndrome)   . Obesity   . Hypertension   . Chronic cervicitis   . Menorrhagia   . GERD (gastroesophageal reflux disease)     Past Surgical History  Procedure Laterality Date  . Uterine ablation    . Polypectomy      uterus  . Tubal ligation    . Vaginal hysterectomy      fibroids  . Esophagogastroduodenoscopy  12/21/2008  . Colonoscopy  10/19/2005  . Cesarean section    . Hysteroscopy w/d&c      resection of endometrial polyp  . Breast lumpectomy      left, benign hamartoma    There were no vitals filed for this visit.  Visit Diagnosis:  Shoulder stiffness, left  Shoulder weakness  Tight fascia  Shoulder pain, left      Subjective Assessment - 12/28/14 0909    Subjective  S: That machine was amazing! I  was pain free up until Sunday.   Currently in Pain? Yes   Pain Score 2    Pain Location Shoulder   Pain Orientation Left   Pain Descriptors / Indicators Aching   Pain Type Acute pain            [facility] OT Assessment - 12/28/14  0911    Assessment   Diagnosis Left shoulder pain   Precautions   Precautions None                  OT Treatments/Exercises (OP) - 12/28/14 0911    Exercises   Exercises Shoulder   Shoulder Exercises: Supine   Protraction PROM;10 reps;Strengthening;12 reps   Protraction Weight (lbs) 1   Horizontal ABduction PROM;10 reps;Strengthening;12 reps   Horizontal ABduction Weight (lbs) 1   External Rotation PROM;10 reps;Strengthening;12 reps   External Rotation Weight (lbs) 1   Internal Rotation PROM;10 reps;Strengthening;12 reps   Internal Rotation Weight (lbs) 1   Flexion PROM;10 reps;Strengthening;12 reps   Shoulder Flexion Weight (lbs) 1   ABduction PROM;10 reps;Strengthening;12 reps   Shoulder ABduction Weight (lbs) 1   Shoulder Exercises: Standing   Protraction Strengthening;12 reps   Protraction Weight (lbs) 1   Horizontal ABduction Strengthening;12 reps   Horizontal ABduction Weight (lbs) 1   External Rotation Strengthening;12 reps   External Rotation Weight (lbs) 1   Internal Rotation Strengthening;12 reps   Internal Rotation Weight (lbs) 1   Flexion Strengthening;12 reps   Shoulder Flexion Weight (lbs) 1  ABduction Strengthening;12 reps   Shoulder ABduction Weight (lbs) 1   Shoulder Exercises: ROM/Strengthening   Wall Pushups 10 reps   Wall Pushups Limitations verbal cues for form and technique   "W" Arms 12X   X to V Arms 10 times with 1#   Proximal Shoulder Strengthening, Supine 12 times each with 1 # without resting   Proximal Shoulder Strengthening, Seated 12X with 1#   Ball on Wall 1' flexion 1' abduction green ball   Modalities   Modalities Electrical Stimulation;Moist Heat   Moist Heat Therapy   Number Minutes Moist Heat 10 Minutes   Moist Heat Location Shoulder   Electrical Stimulation   Electrical Stimulation Location left deltoid   Electrical Stimulation Action interferential   Electrical Stimulation Parameters 4.4 CV   Electrical  Stimulation Goals Pain   Manual Therapy   Manual Therapy Myofascial release   Myofascial Release Myofascial release to left upper arm, deltoid, and trapezius regions to decrease pain and fascial restrictions and increase joint mobility                  OT Short Term Goals - 12/09/14 1033    OT SHORT TERM GOAL #1   Title Patient will be educated and independent with HEP.   Time 3   Period Weeks   Status On-going   OT SHORT TERM GOAL #2   Title Patient will increase AROM of LUE to WNL to increase ability to complete overhead activities with less difficulty.    Time 3   Period Weeks   Status On-going   OT SHORT TERM GOAL #3   Title Patient will decrease fascial restrictions to Mod amount.    Time 3   Period Weeks   Status On-going   OT SHORT TERM GOAL #4   Title Patient will decrease pain level when waking up in the morning to a 5/10.   Time 3   Period Weeks   Status On-going           OT Long Term Goals - 12/09/14 1033    OT LONG TERM GOAL #1   Title Patient will return to highest level independence with all daily and work related tasks.   Time 6   Period Weeks   Status On-going   OT LONG TERM GOAL #2   Title Patient will increase LUE shoulder strength to 5/5 to increase ability to complete heavy household tasks.   Time 6   Period Weeks   Status On-going   OT LONG TERM GOAL #3   Title Patient will decrease fascial restrictions to min amount.    Time 6   Period Weeks   Status On-going   OT LONG TERM GOAL #4   Title Patient will decrease pain level to 2/10 or less during daily tasks to increase ability to return to zumba.   Time 6   Period Weeks   Status On-going               Plan - 12/28/14 1740    Clinical Impression Statement A: Added wall push ups and ball on the wall to focus on scapular strengthening and stability. ES and moist heat completed at end of session for continued pain management.    Plan P: Resume prone and sidelying  exercises.         Problem List Patient Active Problem List   Diagnosis Date Noted  . Palpitations 07/07/2013  . Acute sinusitis 04/21/2013  . Obstructive sleep apnea (adult) (  pediatric) 08/25/2012  . Hypersomnia with sleep apnea, unspecified 08/25/2012  . Bilateral lower extremity edema 11/27/2011  . Obesity, Class III, BMI 40-49.9 (morbid obesity) ([place]) 10/26/2011  . HYPERTENSION, UNSPECIFIED 11/28/2008  . IRRITABLE BOWEL SYNDROME 11/10/2008    Ailene Ravel, OTR/L,CBIS  [phone]  12/28/2014, 10:02 AM  [facility] [address] [place], [place], [address] Phone: [phone]   Fax:  [phone]

## 2014-12-30 ENCOUNTER — Ambulatory Visit (HOSPITAL_COMMUNITY): Payer: 59

## 2014-12-30 ENCOUNTER — Encounter (HOSPITAL_COMMUNITY): Payer: Self-pay

## 2014-12-30 DIAGNOSIS — R29898 Other symptoms and signs involving the musculoskeletal system: Secondary | ICD-10-CM

## 2014-12-30 DIAGNOSIS — M25612 Stiffness of left shoulder, not elsewhere classified: Secondary | ICD-10-CM

## 2014-12-30 DIAGNOSIS — M629 Disorder of muscle, unspecified: Secondary | ICD-10-CM

## 2014-12-30 DIAGNOSIS — M6289 Other specified disorders of muscle: Secondary | ICD-10-CM

## 2014-12-30 NOTE — Therapy (Signed)
[place], [place], [address] Phone: [phone]   Fax:  [phone]  Occupational Therapy Treatment  Patient Details  Name: Patricia [name] MRN: [id number] Date of Birth: 10-21-1965 Referring Provider:  Susy Frizzle, MD  Encounter Date: 12/30/2014      OT End of Session - 12/30/14 0935    Visit Number 8   Number of Visits 12   Date for OT Re-Evaluation 01/31/15  mini reassess on 12/30/14   Authorization Type [employer]   Authorization Time Period Visit limit: 60 per calender year for PT/OT/SP. 0 used this year.   Authorization - Visit Number 8   Authorization - Number of Visits 45   OT Start Time 0845   OT Stop Time [phone]   OT Time Calculation (min) 33 min   Activity Tolerance Patient tolerated treatment well   Behavior During Therapy [facility] for tasks assessed/performed      Past Medical History  Diagnosis Date  . Kidney stones   . IBS (irritable bowel syndrome)   . Obesity   . Hypertension   . Chronic cervicitis   . Menorrhagia   . GERD (gastroesophageal reflux disease)     Past Surgical History  Procedure Laterality Date  . Uterine ablation    . Polypectomy      uterus  . Tubal ligation    . Vaginal hysterectomy      fibroids  . Esophagogastroduodenoscopy  12/21/2008  . Colonoscopy  10/19/2005  . Cesarean section    . Hysteroscopy w/d&c      resection of endometrial polyp  . Breast lumpectomy      left, benign hamartoma    There were no vitals filed for this visit.  Visit Diagnosis:  Shoulder stiffness, left  Shoulder weakness  Tight fascia      Subjective Assessment - 12/30/14 0859    Subjective  S: I had some pain this morning but I did my exercises and it was fine.    Currently in Pain? No/denies                      OT Treatments/Exercises (OP) - 12/30/14 0904    Exercises   Exercises Shoulder   Shoulder Exercises: Supine   Protraction PROM;Strengthening;10  reps   Protraction Weight (lbs) 2   Horizontal ABduction PROM;Strengthening;10 reps   Horizontal ABduction Weight (lbs) 2   External Rotation PROM;Strengthening;10 reps   External Rotation Weight (lbs) 2   Internal Rotation PROM;Strengthening;10 reps   Internal Rotation Weight (lbs) 2   Flexion PROM;Strengthening;10 reps   Shoulder Flexion Weight (lbs) 2   ABduction PROM;Strengthening;10 reps   Shoulder ABduction Weight (lbs) 2   Shoulder Exercises: Prone   Other Prone Exercises Hughston exercises; 12X   Other Prone Exercises Scapula raises 12X   Shoulder Exercises: Sidelying   External Rotation Strengthening;10 reps   External Rotation Weight (lbs) 2   Internal Rotation Strengthening;10 reps   Internal Rotation Weight (lbs) 2   ABduction Strengthening;10 reps   ABduction Weight (lbs) 2   Shoulder Exercises: ROM/Strengthening   X to V Arms 15X with 1#   Proximal Shoulder Strengthening, Supine 10X with 2# no rest breaks   Manual Therapy   Manual Therapy Myofascial release   Myofascial Release Myofascial release to left upper arm, deltoid, and trapezius regions to decrease pain and fascial restrictions and increase joint mobility  OT Short Term Goals - 12/09/14 1033    OT SHORT TERM GOAL #1   Title Patient will be educated and independent with HEP.   Time 3   Period Weeks   Status On-going   OT SHORT TERM GOAL #2   Title Patient will increase AROM of LUE to WNL to increase ability to complete overhead activities with less difficulty.    Time 3   Period Weeks   Status On-going   OT SHORT TERM GOAL #3   Title Patient will decrease fascial restrictions to Mod amount.    Time 3   Period Weeks   Status On-going   OT SHORT TERM GOAL #4   Title Patient will decrease pain level when waking up in the morning to a 5/10.   Time 3   Period Weeks   Status On-going           OT Long Term Goals - 12/09/14 1033    OT LONG TERM GOAL #1   Title  Patient will return to highest level independence with all daily and work related tasks.   Time 6   Period Weeks   Status On-going   OT LONG TERM GOAL #2   Title Patient will increase LUE shoulder strength to 5/5 to increase ability to complete heavy household tasks.   Time 6   Period Weeks   Status On-going   OT LONG TERM GOAL #3   Title Patient will decrease fascial restrictions to min amount.    Time 6   Period Weeks   Status On-going   OT LONG TERM GOAL #4   Title Patient will decrease pain level to 2/10 or less during daily tasks to increase ability to return to zumba.   Time 6   Period Weeks   Status On-going               Plan - 12/30/14 0936    Clinical Impression Statement A: Contuned with sidelying exercises. Increased supine and sidelying to 2# this session . Also added hugston exercises to increase scapular strength. Initial VC needed for form and technique.    Plan P: Mini reassess. Attempt 2# for standing.         Problem List Patient Active Problem List   Diagnosis Date Noted  . Palpitations 07/07/2013  . Acute sinusitis 04/21/2013  . Obstructive sleep apnea (adult) (pediatric) 08/25/2012  . Hypersomnia with sleep apnea, unspecified 08/25/2012  . Bilateral lower extremity edema 11/27/2011  . Obesity, Class III, BMI 40-49.9 (morbid obesity) ([place]) 10/26/2011  . HYPERTENSION, UNSPECIFIED 11/28/2008  . IRRITABLE BOWEL SYNDROME 11/10/2008    Ailene Ravel, OTR/L,CBIS  [phone]  12/30/2014, 9:39 AM  [facility] [address] [place], [place], [address] Phone: [phone]   Fax:  [phone]

## 2015-01-04 ENCOUNTER — Encounter (HOSPITAL_COMMUNITY): Payer: 59 | Admitting: Occupational Therapy

## 2015-01-06 ENCOUNTER — Ambulatory Visit (HOSPITAL_COMMUNITY): Payer: 59 | Admitting: Occupational Therapy

## 2015-01-06 ENCOUNTER — Encounter (HOSPITAL_COMMUNITY): Payer: Self-pay | Admitting: Occupational Therapy

## 2015-01-06 DIAGNOSIS — M629 Disorder of muscle, unspecified: Secondary | ICD-10-CM

## 2015-01-06 DIAGNOSIS — R29898 Other symptoms and signs involving the musculoskeletal system: Secondary | ICD-10-CM

## 2015-01-06 DIAGNOSIS — M25512 Pain in left shoulder: Secondary | ICD-10-CM

## 2015-01-06 DIAGNOSIS — M6289 Other specified disorders of muscle: Secondary | ICD-10-CM

## 2015-01-06 DIAGNOSIS — M25612 Stiffness of left shoulder, not elsewhere classified: Secondary | ICD-10-CM

## 2015-01-06 NOTE — Therapy (Signed)
[place], [place], [address] Phone: [phone]   Fax:  [phone]  Occupational Therapy Reassessment and Treatment  Patient Details  Name: Patricia [name] MRN: [id number] Date of Birth: 04-14-1965 Referring Provider:  Susy Frizzle, MD  Encounter Date: 01/06/2015      OT End of Session - 01/06/15 1227    Visit Number 9   Number of Visits 12   Date for OT Re-Evaluation 01/31/15  mini reassess on 12/30/14   Authorization Type [employer]   Authorization Time Period Visit limit: 60 per calender year for PT/OT/SP. 0 used this year.   Authorization - Visit Number 9   Authorization - Number of Visits 77   OT Start Time 0933   OT Stop Time 1015   OT Time Calculation (min) 42 min   Activity Tolerance Patient tolerated treatment well   Behavior During Therapy [facility] for tasks assessed/performed      Past Medical History  Diagnosis Date  . Kidney stones   . IBS (irritable bowel syndrome)   . Obesity   . Hypertension   . Chronic cervicitis   . Menorrhagia   . GERD (gastroesophageal reflux disease)     Past Surgical History  Procedure Laterality Date  . Uterine ablation    . Polypectomy      uterus  . Tubal ligation    . Vaginal hysterectomy      fibroids  . Esophagogastroduodenoscopy  12/21/2008  . Colonoscopy  10/19/2005  . Cesarean section    . Hysteroscopy w/d&c      resection of endometrial polyp  . Breast lumpectomy      left, benign hamartoma    There were no vitals filed for this visit.  Visit Diagnosis:  Shoulder stiffness, left  Shoulder weakness  Tight fascia  Shoulder pain, left      Subjective Assessment - 01/06/15 0935    Subjective  S: It's sore this morning but I think it's because I haven't been here this week.    Currently in Pain? Yes   Pain Score 5    Pain Location Shoulder   Pain Orientation Left   Pain Descriptors / Indicators Aching   Pain Type Acute pain            [facility] OT Assessment - 01/06/15 0954    Assessment   Diagnosis Left shoulder pain   Precautions   Precautions None   Palpation   Palpation comment mod fascial restrictions in medial deltoid and trapezius regions   AROM   Overall AROM Comments Assessed seated, IR/ER abducted.   AROM Assessment Site Shoulder   Right/Left Shoulder Left   Left Shoulder Flexion 170 Degrees  150 previous   Left Shoulder ABduction 173 Degrees  160 previous   Left Shoulder Internal Rotation 70 Degrees  same as previous   Left Shoulder External Rotation 90 Degrees  same as previous   PROM   Overall PROM  Within functional limits for tasks performed   PROM Assessment Site Shoulder   Right/Left Shoulder Left   Strength   Left Shoulder Flexion 5/5  4+/5 previous   Left Shoulder Extension --   Left Shoulder ABduction 5/5  4+/5 previous   Left Shoulder Internal Rotation 4+/5  4/5 previous   Left Shoulder External Rotation 4+/5  4/5 previous                  OT Treatments/Exercises (OP) - 01/06/15 0254  Exercises   Exercises Shoulder   Shoulder Exercises: Supine   Protraction PROM;Strengthening;10 reps   Protraction Weight (lbs) 2   Horizontal ABduction PROM;Strengthening;10 reps   Horizontal ABduction Weight (lbs) 2   External Rotation PROM;Strengthening;10 reps   External Rotation Weight (lbs) 2   Internal Rotation PROM;Strengthening;10 reps   Internal Rotation Weight (lbs) 2   Flexion PROM;Strengthening;10 reps   Shoulder Flexion Weight (lbs) 2   ABduction PROM;Strengthening;10 reps   Shoulder ABduction Weight (lbs) 2   Shoulder Exercises: ROM/Strengthening   Other ROM/Strengthening Exercises green theraband strengthening exercises: horizontal ab/adduction, ER/IR, PNF pattern exercises   Manual Therapy   Manual Therapy Myofascial release   Myofascial Release Myofascial release to left upper arm, deltoid, and trapezius regions to decrease pain and fascial restrictions and  increase joint mobility               OT Education - 01/06/15 1227    Education provided Yes   Education Details green theraband strengthening exercises   Person(s) Educated Patient   Methods Explanation;Demonstration;Handout   Comprehension Verbalized understanding;Returned demonstration          OT Short Term Goals - 01/06/15 0959    OT SHORT TERM GOAL #1   Title Patient will be educated and independent with HEP.   Time 3   Period Weeks   Status Achieved   OT SHORT TERM GOAL #2   Title Patient will increase AROM of LUE to WNL to increase ability to complete overhead activities with less difficulty.    Time 3   Period Weeks   Status Achieved   OT SHORT TERM GOAL #3   Title Patient will decrease fascial restrictions to Mod amount.    Time 3   Period Weeks   Status Achieved   OT SHORT TERM GOAL #4   Title Patient will decrease pain level when waking up in the morning to a 5/10.   Time 3   Period Weeks   Status Achieved           OT Long Term Goals - 01/06/15 1000    OT LONG TERM GOAL #1   Title Patient will return to highest level independence with all daily and work related tasks.   Time 6   Period Weeks   Status Achieved   OT LONG TERM GOAL #2   Title Patient will increase LUE shoulder strength to 5/5 to increase ability to complete heavy household tasks.   Time 6   Period Weeks   Status Partially Met   OT LONG TERM GOAL #3   Title Patient will decrease fascial restrictions to min amount.    Time 6   Period Weeks   Status Not Met   OT LONG TERM GOAL #4   Title Patient will decrease pain level to 2/10 or less during daily tasks to increase ability to return to zumba.   Time 6   Period Weeks   Status Achieved               Plan - 01/06/15 1228    Clinical Impression Statement A: Mini reassessment completed this session. Pt has met all STGs, 2/4 LTGs, partially met 1/4 LTGs, with 1/4 LTGs not met. Pt reports she is able to complete all  daily tasks with little to no pain. She does experience soreness in the mornings but it resolves with exercises and stretching. Provided and educated pt on green theraband HEP, along with information on purchasing a home TENS  unit. Pt is agreeable to discharge.    Plan P: Discharge pt.         Problem List Patient Active Problem List   Diagnosis Date Noted  . Palpitations 07/07/2013  . Acute sinusitis 04/21/2013  . Obstructive sleep apnea (adult) (pediatric) 08/25/2012  . Hypersomnia with sleep apnea, unspecified 08/25/2012  . Bilateral lower extremity edema 11/27/2011  . Obesity, Class III, BMI 40-49.9 (morbid obesity) ([place]) 10/26/2011  . HYPERTENSION, UNSPECIFIED 11/28/2008  . IRRITABLE BOWEL SYNDROME 11/10/2008    Guadelupe Sabin, OTR/L  [phone]  01/06/2015, 12:34 PM  [place], [place], [address] Phone: [phone]   Fax:  [phone]   OCCUPATIONAL THERAPY DISCHARGE SUMMARY  Visits from Start of Care: 9  Current functional level related to goals / functional outcomes: See above goals and measurements. Pt reports she is able to complete all daily tasks at prior level of functioning with little to no pain.    Remaining deficits: Pt continues to report soreness when she gets up in the mornings, however is able to resolve pain/discomfort with stretches and exercises.    Education / Equipment: Nyoka Cowden theraband strengthening exercises, information on purchasing a home TENS unit.  Plan: Patient agrees to discharge.  Patient goals were met. Patient is being discharged due to meeting the stated rehab goals.  ?????

## 2015-01-06 NOTE — Patient Instructions (Signed)
Strengthening: Chest Pull - Resisted   Hold Theraband in front of body with hands about shoulder width a part. Pull band a part and back together slowly. Repeat _10-15___ times. Complete _1___ set(s) per session.. Repeat __2__ session(s) per day.  http://orth.exer.us/926   Copyright  VHI. All rights reserved.   PNF Strengthening: Resisted   Standing with resistive band around each hand, bring right arm up and away, thumb back. Repeat __10-15__ times per set. Do __1__ sets per session. Do __2__ sessions per day.  http://orth.exer.us/918   Copyright  VHI. All rights reserved.   PNF Strengthening: Resisted   Standing with resistive band around each hand, bring right arm up and across body. Repeat _10-15___ times per set. Do _1___ sets per session. Do _2___ sessions per day.  http://orth.exer.us/920   Copyright  VHI. All rights reserved.    Resisted External Rotation: in Neutral - Bilateral   Sit or stand, tubing in both hands, elbows at sides, bent to 90, forearms forward. Pinch shoulder blades together and rotate forearms out. Keep elbows at sides. Repeat __10-15__ times per set. Do __1__ sets per session. Do _2___ sessions per day.  http://orth.exer.us/966   Copyright  VHI. All rights reserved.   PNF Strengthening: Resisted   Standing, hold resistive band above head. Bring right arm down and out from side. Repeat __10-15__ times per set. Do __1__ sets per session. Do _2___ sessions per day.  http://orth.exer.us/922   Copyright  VHI. All rights reserved.

## 2015-01-07 ENCOUNTER — Other Ambulatory Visit: Payer: Self-pay | Admitting: Family Medicine

## 2015-01-07 NOTE — Telephone Encounter (Signed)
Refill appropriate and filled per protocol. 

## 2015-01-11 ENCOUNTER — Encounter (HOSPITAL_COMMUNITY): Payer: 59

## 2015-01-13 ENCOUNTER — Encounter (HOSPITAL_COMMUNITY): Payer: 59

## 2015-01-13 ENCOUNTER — Ambulatory Visit (INDEPENDENT_AMBULATORY_CARE_PROVIDER_SITE_OTHER): Payer: 59 | Admitting: Family Medicine

## 2015-01-13 ENCOUNTER — Other Ambulatory Visit: Payer: Self-pay | Admitting: Family Medicine

## 2015-01-13 ENCOUNTER — Encounter: Payer: Self-pay | Admitting: Family Medicine

## 2015-01-13 VITALS — BP 130/72 | HR 88 | Temp 98.5°F | Resp 16 | Ht 61.5 in | Wt 269.0 lb

## 2015-01-13 DIAGNOSIS — N6310 Unspecified lump in the right breast, unspecified quadrant: Secondary | ICD-10-CM

## 2015-01-13 DIAGNOSIS — Z23 Encounter for immunization: Secondary | ICD-10-CM

## 2015-01-13 DIAGNOSIS — Z09 Encounter for follow-up examination after completed treatment for conditions other than malignant neoplasm: Secondary | ICD-10-CM

## 2015-01-13 DIAGNOSIS — Q828 Other specified congenital malformations of skin: Secondary | ICD-10-CM

## 2015-01-13 DIAGNOSIS — Z1239 Encounter for other screening for malignant neoplasm of breast: Secondary | ICD-10-CM

## 2015-01-13 NOTE — Progress Notes (Signed)
   Subjective:    Patient ID: Patricia [name], female    DOB: 1966/03/16, 49 y.o.   MRN: [id number]  HPI Patient is here today asking for skin takes to be removed. She has one small skin tag just behind her right eye anterior to her ear. She has another small skin tag on her left lower eyelid. She has another small skin tag behind her left ear near the angle of the mandible. Each is approximately 2 mm in size, pedunculated. Past Medical History  Diagnosis Date  . Kidney stones   . IBS (irritable bowel syndrome)   . Obesity   . Hypertension   . Chronic cervicitis   . Menorrhagia   . GERD (gastroesophageal reflux disease)    Past Surgical History  Procedure Laterality Date  . Uterine ablation    . Polypectomy      uterus  . Tubal ligation    . Vaginal hysterectomy      fibroids  . Esophagogastroduodenoscopy  12/21/2008  . Colonoscopy  10/19/2005  . Cesarean section    . Hysteroscopy w/d&c      resection of endometrial polyp  . Breast lumpectomy      left, benign hamartoma   Current Outpatient Prescriptions on File Prior to Visit  Medication Sig Dispense Refill  . fluticasone (FLONASE) 50 MCG/ACT nasal spray PLACE 2 SPRAYS INTO THE NOSE AT BEDTIME. 16 g 4  . KLOR-CON M20 20 MEQ tablet TAKE 1 TABLET EVERY DAY 30 tablet 3  . losartan-hydrochlorothiazide (HYZAAR) 100-12.5 MG tablet TAKE 1 TABLET BY MOUTH EVERY DAY 90 tablet 0  . omeprazole (PRILOSEC) 40 MG capsule TAKE 1 CAPSULE BY MOUTH ONCE A DAY 30 capsule 11  . Probiotic Product (PROBIOTIC DAILY PO) Take 1 tablet by mouth daily.     No current facility-administered medications on file prior to visit.   No Known Allergies Social History   Social History  . Marital Status: Married    Spouse Name: Montine Circle  . Number of Children: 2  . Years of Education: 16   Occupational History  . QUALITY ANALYST    Social History Main Topics  . Smoking status: Never Smoker   . Smokeless tobacco: Never Used  . Alcohol Use: Yes   Comment: socially  . Drug Use: No  . Sexual Activity: Not on file   Other Topics Concern  . Not on file   Social History Narrative   Patient is right handed and consumes green tea daily      Review of Systems  All other systems reviewed and are negative.      Objective:   Physical Exam  Cardiovascular: Normal rate and regular rhythm.   Pulmonary/Chest: Effort normal and breath sounds normal.          Assessment & Plan:  Accessory skin tags  I did not use anesthesia. Instead I grabbed each of the 3 skin tags using a pair of forceps. I applied gentle traction and then using a pair of iris scissors, snipped them from the base. There was no bleeding or complication. Patient tolerated procedure well.

## 2015-01-13 NOTE — Addendum Note (Signed)
Addended by: Jenna Luo on: 01/13/2015 10:36 AM   Modules accepted: Orders

## 2015-01-13 NOTE — Addendum Note (Signed)
Addended by: Shary Decamp B on: 01/13/2015 12:11 PM   Modules accepted: Orders

## 2015-01-18 ENCOUNTER — Encounter (HOSPITAL_COMMUNITY): Payer: 59 | Admitting: Occupational Therapy

## 2015-01-31 ENCOUNTER — Ambulatory Visit: Payer: Self-pay | Admitting: Neurology

## 2015-02-03 ENCOUNTER — Other Ambulatory Visit: Payer: Self-pay | Admitting: Family Medicine

## 2015-02-07 LAB — HM MAMMOGRAPHY

## 2015-02-14 ENCOUNTER — Encounter: Payer: Self-pay | Admitting: *Deleted

## 2015-04-19 ENCOUNTER — Other Ambulatory Visit (HOSPITAL_COMMUNITY): Payer: Self-pay | Admitting: General Surgery

## 2015-04-30 ENCOUNTER — Other Ambulatory Visit: Payer: Self-pay | Admitting: Family Medicine

## 2015-05-13 ENCOUNTER — Ambulatory Visit (HOSPITAL_COMMUNITY)
Admission: RE | Admit: 2015-05-13 | Discharge: 2015-05-13 | Disposition: A | Discharge status: Home / Self Care | Payer: 59 | Source: Ambulatory Visit | Attending: General Surgery | Admitting: General Surgery

## 2015-05-13 ENCOUNTER — Other Ambulatory Visit: Payer: Self-pay

## 2015-05-13 DIAGNOSIS — Z01818 Encounter for other preprocedural examination: Secondary | ICD-10-CM | POA: Insufficient documentation

## 2015-06-10 ENCOUNTER — Ambulatory Visit: Payer: 59 | Admitting: Dietician

## 2015-06-21 ENCOUNTER — Encounter: Payer: Self-pay | Admitting: Internal Medicine

## 2015-07-13 ENCOUNTER — Encounter: Payer: 59 | Attending: General Surgery | Admitting: Dietician

## 2015-07-13 ENCOUNTER — Encounter: Payer: Self-pay | Admitting: Dietician

## 2015-07-13 NOTE — Patient Instructions (Signed)
Follow Pre-Op Goals Try Protein Shakes Call [facility] at [phone] when surgery is scheduled to enroll in Pre-Op Class  Things to remember:  Please always be honest with us. We want to support you!  If you have any questions or concerns in between appointments, please call or email Liz, Leslie, or Laurie.  The diet after surgery will be high protein and low in carbohydrate.  Vitamins and calcium need to be taken for the rest of your life.  Feel free to include support people in any classes or appointments.   Supplement recommendations:  Complete" Multivitamin: Sleeve Gastrectomy and RYGB patients take a double dose of MVI. LAGB patients take single dose as it is written on the package. Vitamin must be liquid or chewable but not gummy. Examples of these include Flintstones Complete and Centrum Complete. If the vitamin is bariatric-specific, take 1 dose as it is already formulated for bariatric surgery patients. Examples of these are Bariatric Advantage, Celebrate, and Wellesse. These can be found at the [facility] and/or online.     Calcium citrate: 1500 mg/day of Calcium citrate (also chewable or liquid) is recommended for all procedures. The body is only able to absorb 500-600 mg of Calcium at one time so 3 daily doses of 500 mg are recommended. Calcium doses must be taken a minimum of 2 hours apart. Additionally, Calcium must be taken 2 hours apart from iron-containing MVI. Examples of brands include Celebrate, Bariatric Advantage, and Wellesse. These brands must be purchased online or at the [facility]. Citracal Petites is the only Calcium citrate supplement found in general grocery stores and pharmacies. This is in tablet form and may be recommended for patients who do not tolerate chewable Calcium.  Continued or added Vitamin D supplementation based on individual needs.    Vitamin B12: 300-500 mcg/day for Sleeve Gastrectomy and RYGB. Optional for  LAGB patients as stomach remains fully intact. Must be taken intramuscularly, sublingually, or inhaled nasally. Oral route is not recommended. 

## 2015-07-13 NOTE — Progress Notes (Signed)
  Pre-Op Assessment Visit:  Pre-Operative Sleeve Gastrectomy Surgery  Medical Nutrition Therapy:  Appt start time: [id number]   End time:  1720.  Patient was seen on 07/13/2015 for Pre-Operative Nutrition Assessment. Assessment and letter of approval faxed to [facility] Surgery Bariatric Surgery Program coordinator on 07/13/2015.   Preferred Learning Style:   No preference indicated   Learning Readiness:   Ready  Handouts given during visit include:  Pre-Op Goals Bariatric Surgery Protein Shakes   During the appointment today the following Pre-Op Goals were reviewed with the patient: Maintain or lose weight as instructed by your surgeon Make healthy food choices Begin to limit portion sizes Limited concentrated sugars and fried foods Keep fat/sugar in the single digits per serving on   food labels Practice CHEWING your food  (aim for 30 chews per bite or until applesauce consistency) Practice not drinking 15 minutes before, during, and 30 minutes after each meal/snack Avoid all carbonated beverages  Avoid/limit caffeinated beverages  Avoid all sugar-sweetened beverages Consume 3 meals per day; eat every 3-5 hours Make a list of non-food related activities Aim for 64-100 ounces of FLUID daily  Aim for at least 60-80 grams of PROTEIN daily Look for a liquid protein source that contain ?15 g protein and ?5 g carbohydrate  (ex: shakes, drinks, shots)  Patient-Centered Goals: Would like more energy, start to be able to run  10 confidence/10 importance scale   Demonstrated degree of understanding via:  Teach Back  Teaching Method Utilized:  Visual Auditory Hands on  Barriers to learning/adherence to lifestyle change: none  [facility] to [facility] the [facility] to enroll in Pre-Op and Post-Op Nutrition Education when surgery date is scheduled.

## 2015-08-02 ENCOUNTER — Telehealth: Payer: Self-pay

## 2015-08-02 NOTE — Telephone Encounter (Signed)
I called patient and notified her to bring SD card to appt tomorrow so we can get recent CPAP download.

## 2015-08-03 ENCOUNTER — Ambulatory Visit (INDEPENDENT_AMBULATORY_CARE_PROVIDER_SITE_OTHER): Payer: 59 | Admitting: Neurology

## 2015-08-03 ENCOUNTER — Encounter: Payer: Self-pay | Admitting: Neurology

## 2015-08-03 VITALS — BP 130/86 | HR 78 | Resp 18 | Ht 61.5 in | Wt 262.0 lb

## 2015-08-03 DIAGNOSIS — Z9989 Dependence on other enabling machines and devices: Secondary | ICD-10-CM

## 2015-08-03 DIAGNOSIS — G4733 Obstructive sleep apnea (adult) (pediatric): Secondary | ICD-10-CM | POA: Diagnosis not present

## 2015-08-03 NOTE — Progress Notes (Signed)
Subjective:    [name] ID: Patricia [name] is a 50 y.o. female.  HPI     Interim history:   Patricia [name] is a very pleasant 50 year old right-handed woman with an underlying medical history of irritable bowel syndrome, obesity, hypertension, reflux disease as well as kidney stones, who presents for followup consultation of Patricia [name] obstructive sleep apnea, on treatment with CPAP. Patricia [name] is unaccompanied Patricia [name]. Of note, Patricia [name] canceled an appointment for 01/31/2015. Patricia last saw Patricia [name] on 01/25/2014, at which time Patricia [name] reported doing well with CPAP. Patricia [name] was compliant with treatment. Patricia [name] had resolution of Patricia [name] morning headaches and better daytime energy. Patricia [name] was working on weight loss. Patricia suggested a one-year checkup.   Patricia [name], 08/03/2015: Patricia [name] from 07/04/2015 through 08/02/2015 which is a total of 30 days during which time Patricia [name] used Patricia [name] machine every night with percent used days greater than 4 hours at 100%, indicating superb compliance with an average usage of 6 hours and 44 minutes, residual AHI low at 0.4 per hour, leak low with Patricia 95th percentile at 7.5 L/m on a pressure of 8 cm with EPR of 3.  Patricia [name], 08/03/2015: Patricia [name] reports doing well with CPAP. Patricia [name] has no new complaints. Patricia [name] is compliant with treatment and feels better. Patricia [name] needs a new supplies, uses nasal pillows. Patricia [name] is trying to lose weight, no coffee, likes tea with honey and sweetened tea.   Previously:   Of note Patricia [name] did not show for an appointment on 05/18/2013 as well as 09/29/2013. Patricia saw Patricia [name] on 11/14/2012, at which time we discussed Patricia [name] baseline sleep study as well as CPAP titration study findings as well as Patricia [name] initial compliance [name]. Patricia [name] reported good results with CPAP and resolution of Patricia [name] morning headaches, but had ongoing issues with nasal congestion, rhinorrhea and postnasal drip. Patricia prescribed Flonase for nighttime use as well as requested that Patricia [name] start using nasal saline rinses during Patricia day. Patricia reviewed Patricia [name] compliance  [name] at Patricia time and congratulated Patricia [name] on Patricia [name] good compliance. We considered referral to an allergy specialist. Patricia reviewed Patricia [name]'s CPAP compliance [name] from 11/30/2012 to 12/29/2012, which is a total of 30 days, during which time Patricia [name] used CPAP every day. Patricia average usage for all days was 5 hours and 51 minutes. Patricia percent used days greater than 4 hours was 90 %, indicating excellent compliance. Patricia residual AHI was 0.5 per hour, indicating a very appropriate treatment pressure of 8 cwp with EPR of 3.  Patricia reviewed Patricia [name] compliance [name] from 12/26/2013 through 01/24/2014 which is a total of 30 days during which time Patricia [name] was under percent compliant, average usage of 6 hours and 40 minutes, residual AHI low at 0.5 per hour, leak low at 4.2 L/m for Patricia 95th percentile, pressure at 8 cm with EPR of 3.  Patricia first met Patricia [name] on 08/01/2012, at which time Patricia [name] reported nonrestorative sleep, morning headaches, daytime somnolence as well as snoring. Patricia asked Patricia [name] to come back for sleep study and Patricia [name] had a baseline sleep test on 08/12/2012: Patricia [name] had an increased percentage of stage II sleep. Sleep efficiency was reduced at 76%. Patricia [name] had mild to moderate snoring. Patricia [name] had a total of 9 obstructive apneas and 49 obstructive hypopneas, resulting in an AHI of 10.4 per hour, markedly worse in REM sleep with 43.9 events per hour. Baseline oxygen saturation was 94% and Patricia [name] nadir was 77% in REM sleep. Based on Patricia degree of desaturation Patricia asked Patricia [name]  to come back for a CPAP titration study. Patricia [name] had that test on 09/08/2012: Sleep efficiency was normal at 92.9%. Patricia [name] had a normal percentage of stage Patricia and 2 sleep, 12.3 percent of deep sleep and an increased percentage of REM sleep at 35.2% with a reduced REM latency of 54 minutes. Patricia [name] had mild pruritic leg movements at 7.9 per hour but no associated arousals from this. Patricia [name] was titrated on CPAP using a nasal pillows mask from 5-8 cm and had a residual AHI of 0 per hour on 8 cm of  pressure. Patricia [name] baseline oxygen saturation was 96%, Patricia [name] nadir was 88% with less than 1 minute below Patricia saturation of 90% for Patricia night. Based on those test results Patricia prescribed CPAP for Patricia [name]. Patricia reviewed compliance [name] from 10/03/2012 through 11/09/2012 (38 days), during which time Patricia [name] used it every day except for one day. Percent used days greater than 4 hours was 82% indicating good compliance. Patricia [name] average usage was 5 hours and 3 minutes for all days and Patricia [name] residual AHI was 0.9 per hour indicating an adequate treatment pressure of 8 cm of water with EPR of 3.   Patricia [name] Past Medical History Is Significant For: Past Medical History  Diagnosis Date  . Kidney stones   . IBS (irritable bowel syndrome)   . Obesity   . Hypertension   . Chronic cervicitis   . Menorrhagia   . GERD (gastroesophageal reflux disease)     Patricia [name] Past Surgical History Is Significant For: Past Surgical History  Procedure Laterality Date  . Uterine ablation    . Polypectomy      uterus  . Tubal ligation    . Vaginal hysterectomy      fibroids  . Esophagogastroduodenoscopy  12/21/2008  . Colonoscopy  10/19/2005  . Cesarean section    . Hysteroscopy w/d&c      resection of endometrial polyp  . Breast lumpectomy      left, benign hamartoma    Patricia [name] Family History Is Significant For: Family History  Problem Relation Age of Onset  . Hypertension Father   . Hypertension Mother   . Diabetes Mother   . Coronary artery disease Mother   . Cancer Brother     Carcinoid tumor of Patricia appendix    Patricia [name] Social History Is Significant For: Social History   Social History  . Marital Status: Married    Spouse Name: Montine Circle  . Number of Children: 2  . Years of Education: 16   Occupational History  . QUALITY ANALYST    Social History Main Topics  . Smoking status: Never Smoker   . Smokeless tobacco: Never Used  . Alcohol Use: Yes     Comment: socially  . Drug Use: No  . Sexual Activity: Not Asked   Other Topics Concern   . None   Social History Narrative   [name] is right handed and consumes green tea daily    Patricia [name] Allergies Are:  No Known Allergies:   Patricia [name] Current Medications Are:  Outpatient Encounter Prescriptions as of 08/03/2015  Medication Sig  . fluticasone (FLONASE) 50 MCG/ACT nasal spray PLACE 2 SPRAYS INTO Patricia NOSE AT BEDTIME.  Marland Kitchen KLOR-CON M20 20 MEQ tablet TAKE 1 TABLET EVERY DAY  . losartan-hydrochlorothiazide (HYZAAR) 100-12.5 MG tablet TAKE 1 TABLET BY MOUTH EVERY DAY  . Multiple Vitamin (MULTIVITAMIN) capsule Take 1 capsule by mouth daily.  Marland Kitchen omeprazole (PRILOSEC) 40 MG capsule TAKE 1 CAPSULE BY MOUTH ONCE A DAY  .  Probiotic Product (PROBIOTIC DAILY PO) Take 1 tablet by mouth daily.   No facility-administered encounter medications on file as of 08/03/2015.  :  Review of Systems:  Out of a complete 14 point review of systems, all are reviewed and negative with Patricia exception of these symptoms as listed below:   Review of Systems  Neurological:       [name] is here for f/u. States that Patricia [name] is doing well with CPAP. Needs new Rx for new CPAP supplies.      Objective:  Neurologic [name]  Physical [name] Physical Examination:   Filed Vitals:   08/03/15 1607  BP: 130/86  Pulse: 78  Resp: 18    General Examination: Patricia [name] is a very pleasant 50 y.o. female in no acute distress. Patricia [name] appears well-developed and well-nourished and well groomed. Patricia [name] is obese. Patricia [name] is in good spirits Patricia [name].  HEENT: Normocephalic, atraumatic, pupils are equal, round and reactive to light and accommodation. Extraocular tracking is good without limitation to gaze excursion or nystagmus noted. Normal smooth pursuit is noted. Hearing is grossly intact. Face is symmetric with normal facial animation and normal facial sensation. Speech is clear with no dysarthria noted. There is no hypophonia. There is no lip, neck/head, jaw or voice tremor. Neck is supple with full range of passive and active motion. There are  no carotid bruits on auscultation. Oropharynx [name] reveals: good dental hygiene and moderate airway crowding, due to elongated uvula, large tongue, and tonsillar size are 1+ bilaterally. Mallampati is class II. Tongue protrudes centrally and palate elevates symmetrically.   Chest: Clear to auscultation without wheezing, rhonchi or crackles noted.  Heart: S1+S2+0, regular and normal without murmurs, rubs or gallops noted.   Abdomen: Soft, non-tender and non-distended with normal bowel sounds appreciated on auscultation.  Extremities: There is trace pitting edema in Patricia left ankle only.   Skin: Warm and dry without trophic changes noted. There are no varicose veins.  Musculoskeletal: [name] reveals no obvious joint deformities, tenderness or joint swelling or erythema.   Neurologically:  Mental status: Patricia [name] is awake, alert and oriented in all 4 spheres. Patricia [name] memory, attention, language and knowledge are appropriate. There is no aphasia, agnosia, apraxia or anomia. Speech is clear with normal prosody and enunciation. Thought process is linear. Mood is congruent and affect is normal.  Cranial nerves are as described above under HEENT [name]. In addition, shoulder shrug is normal with equal shoulder height noted. Motor [name]: Normal bulk, strength and tone is noted. There is no drift, tremor or rebound. Romberg is negative. Reflexes are 2+ throughout. Toes are downgoing bilaterally. Fine motor skills are intact with normal finger taps, normal hand movements, normal rapid alternating patting, normal foot taps and normal foot agility.  Cerebellar testing shows no dysmetria or intention tremor on finger to nose testing. There is no truncal or gait ataxia. Heel to shin is difficult.  Sensory [name] is intact in Patricia upper and lower extremities.  Gait, station and balance are unremarkable. No veering to one side is noted. No leaning to one side is noted. Posture is age-appropriate and stance is narrow based.  No problems turning are noted. Patricia [name] turns en bloc. Tandem walk is unremarkable.        Assessment and Plan:   In summary, JAEMARIE HOCHBERG is a very pleasant 50 year old female with an underlying medical history of irritable bowel syndrome, obesity, hypertension, reflux disease as well as kidney stones, who presents for followup consultation of Patricia [name]  obstructive sleep apnea, treated with CPAP treatment at a pressure of 8 cwp with good results and excellent compliance. Patricia [name] is stable and Patricia [name] indicates ongoing good results with Patricia use of CPAP, and good tolerance of Patricia pressure and mask. Patricia congratulated Patricia [name] on Patricia [name] superb compliance and Patricia we Reviewed Patricia [name] most recent compliance [name] and briefly also discussed Patricia [name] sleep study results from 08/12/2012 and 09/08/2012 Patricia [name].  Patricia encouraged Patricia [name] to continue to use CPAP regularly to help reduce cardiovascular risk and to keep Patricia [name] sleep related symptoms treated as well as they are currently.  We again talked about trying to maintaining a healthy lifestyle in general. Patricia encouraged Patricia [name] to eat healthy, exercise daily and keep well hydrated, to keep a scheduled bedtime and wake time routine, to not skip any meals and eat healthy snacks in between meals and to have protein with every meal. Patricia stressed Patricia importance of regular exercise and commended Patricia [name] on Patricia [name] weight loss endeavors; Patricia [name] has joined Patricia Y.Patricia would like to see Patricia [name] back in one year, sooner if needed. Patricia answered all Patricia [name] questions Patricia [name] and Patricia [name] was in agreement. Patricia placed an order for CPAP supplies which we will fax to Patricia [name] DME company. Patricia answered all Patricia [name] questions Patricia [name] and Patricia [name] was in agreement with Patricia above outlined plan. Patricia would like to see Patricia [name] back in one year.

## 2015-08-08 ENCOUNTER — Encounter: Payer: 59 | Attending: General Surgery | Admitting: Dietician

## 2015-08-09 ENCOUNTER — Encounter: Payer: Self-pay | Admitting: Dietician

## 2015-08-09 NOTE — Progress Notes (Signed)
Supervised Weight Loss Class:  Appt start time: 1600   End time:  1630.  Patient was seen on 08/08/2015 for the "Mindful Eating" Supervised Weight Loss Class at the [facility].   Surgery type: sleeve gastrectomy Start weight at [facility]: 264 lbs Weight today: 262.3 lbs Weight change: 1.7 lbs   The following learning objectives were met by the patient during this class:  Objectives: -Discuss masticating foods to appropriate consistency  -Helps food fit in pouch and prevents pain and vomiting  -Eating slowly and taking tiny bites help brain register fullness -Encourage patients to begin to think about what diet will be like after surgery -Encourage patients to begin to consider portion sizes and listen to hunger/fullness cues -Review importance of regular meals and snacks -Discuss diet advancement after surgery -Review causes and symptoms of dumping syndrome  Goals: -Begin limiting fried and other high fat foods and foods high in sugar -Eat proteins first, then vegetables, then starch -Practice chewing foods to applesauce consistency -Aim to spend 20 minutes eating meals -Work on eating 3 meals a day (or every 3-5 hours) if you are not already doing so  Handouts given: Mindful Eating brochure

## 2015-09-12 ENCOUNTER — Encounter: Payer: 59 | Attending: General Surgery | Admitting: Dietician

## 2015-09-13 ENCOUNTER — Encounter: Payer: Self-pay | Admitting: Dietician

## 2015-09-13 NOTE — Progress Notes (Signed)
Supervised Weight Loss Class:  Appt start time: 1600   End time:  1630.  Patient was seen on 09/12/2015 for the "Protein" Supervised Weight Loss Class at the [facility].   Surgery type: sleeve gastrectomy Start weight at [facility]: 264 lbs Weight today: 264.2 lbs Weight change: 2 lbs gain   The following learning objectives were met by the patient during this class:  Objectives: -Discuss importance of protein after surgery  -Promotes healing  -Helps preserve lean mass (muscles and organs) as you lose fat -Identify foods that contain protein -Discuss recommended daily protein goals for men and women after surgery -Educate patients on body composition scale and purpose   Goals: -Begin to be mindful of how much protein is in the foods you are eating -Know how much protein is recommended daily after surgery -Begin having a protein food with each meal and snack  Handouts given: "Protein" pamphlet

## 2015-09-25 ENCOUNTER — Other Ambulatory Visit: Payer: Self-pay | Admitting: Family Medicine

## 2015-10-06 ENCOUNTER — Encounter: Payer: Self-pay | Admitting: Cardiology

## 2015-10-06 ENCOUNTER — Ambulatory Visit (INDEPENDENT_AMBULATORY_CARE_PROVIDER_SITE_OTHER): Payer: 59 | Admitting: Cardiology

## 2015-10-06 VITALS — BP 117/77 | HR 69 | Ht 61.5 in | Wt 267.0 lb

## 2015-10-06 DIAGNOSIS — R002 Palpitations: Secondary | ICD-10-CM | POA: Diagnosis not present

## 2015-10-06 DIAGNOSIS — R0602 Shortness of breath: Secondary | ICD-10-CM | POA: Diagnosis not present

## 2015-10-06 DIAGNOSIS — I10 Essential (primary) hypertension: Secondary | ICD-10-CM

## 2015-10-06 NOTE — Progress Notes (Signed)
Clinical Summary Ms. Benak is a 50 y.o.female seen today for follow up of the following medical probems.   1. Palpitations  - previous 7 day event monitor showed no signficant arrhythmias - no recent symptoms.   2. OSA -on cpap  3. SOB - has some heavy breathing at times. Can occur at rest or with activity - has had some LE edema as well. No orthopnea or PND   4. Obesity - being evaluted for gastric sleeve procedure     SH: works as Land with [employer] Past Medical History  Diagnosis Date  . Kidney stones   . IBS (irritable bowel syndrome)   . Obesity   . Hypertension   . Chronic cervicitis   . Menorrhagia   . GERD (gastroesophageal reflux disease)      No Known Allergies   Current Outpatient Prescriptions  Medication Sig Dispense Refill  . fluticasone (FLONASE) 50 MCG/ACT nasal spray PLACE 2 SPRAYS INTO THE NOSE AT BEDTIME. 16 g 4  . KLOR-CON M20 20 MEQ tablet TAKE 1 TABLET EVERY DAY 30 tablet 11  . losartan-hydrochlorothiazide (HYZAAR) 100-12.5 MG tablet TAKE 1 TABLET BY MOUTH EVERY DAY 90 tablet 0  . Multiple Vitamin (MULTIVITAMIN) capsule Take 1 capsule by mouth daily.    Marland Kitchen omeprazole (PRILOSEC) 40 MG capsule TAKE 1 CAPSULE BY MOUTH ONCE A DAY 30 capsule 11  . Probiotic Product (PROBIOTIC DAILY PO) Take 1 tablet by mouth daily.     No current facility-administered medications for this visit.     Past Surgical History  Procedure Laterality Date  . Uterine ablation    . Polypectomy      uterus  . Tubal ligation    . Vaginal hysterectomy      fibroids  . Esophagogastroduodenoscopy  12/21/2008  . Colonoscopy  10/19/2005  . Cesarean section    . Hysteroscopy w/d&c      resection of endometrial polyp  . Breast lumpectomy      left, benign hamartoma     No Known Allergies    Family History  Problem Relation Age of Onset  . Hypertension Father   . Hypertension Mother   . Diabetes Mother   . Coronary artery disease  Mother   . Cancer Brother     Carcinoid tumor of the appendix     Social History Ms. Felipe reports that she has never smoked. She has never used smokeless tobacco. Ms. Demarest reports that she drinks alcohol.   Review of Systems CONSTITUTIONAL: No weight loss, fever, chills, weakness or fatigue.  HEENT: Eyes: No visual loss, blurred vision, double vision or yellow sclerae.No hearing loss, sneezing, congestion, runny nose or sore throat.  SKIN: No rash or itching.  CARDIOVASCULAR: per HPI RESPIRATORY: per HPI  GASTROINTESTINAL: No anorexia, nausea, vomiting or diarrhea. No abdominal pain or blood.  GENITOURINARY: No burning on urination, no polyuria NEUROLOGICAL: No headache, dizziness, syncope, paralysis, ataxia, numbness or tingling in the extremities. No change in bowel or bladder control.  MUSCULOSKELETAL: No muscle, back pain, joint pain or stiffness.  LYMPHATICS: No enlarged nodes. No history of splenectomy.  PSYCHIATRIC: No history of depression or anxiety.  ENDOCRINOLOGIC: No reports of sweating, cold or heat intolerance. No polyuria or polydipsia.  Marland Kitchen   Physical Examination Filed Vitals:   10/06/15 1513  BP: 117/77  Pulse: 69   Filed Weights   10/06/15 1513  Weight: 267 lb (121.11 kg)    Gen: resting comfortably, no acute distress  HEENT: no scleral icterus, pupils equal round and reactive, no palptable cervical adenopathy,  CV: RRR, 2/6 sysotlic murmur RUSB, no jvd Resp: Clear to auscultation bilaterally GI: abdomen is soft, non-tender, non-distended, normal bowel sounds, no hepatosplenomegaly MSK: extremities are warm, no edema.  Skin: warm, no rash Neuro:  no focal deficits Psych: appropriate affect   Diagnostic Studies Pertinent labs 05/2012: K 4.7 Cr .66 AST 16 ALT 9 TC 179 TG 99 HDL 50 LDL 109 TSH 1.9   12/03/12 EKG: sinus, rate 75, normal axis, LAE, no ischemic changes   03/25/13 Clinic EKG  Sinus rhythm, normal EKG  03/2013 Event Monitor 7 Days No  symptoms, normal sinus rhythm    Assessment and Plan   1. Palpitations  - benign event monitor, no recurrent symptoms. EKG in clinic shows NSR - continue to monitor  2. SOB - recent SOB/DOE along with some LE edema - we will obtain echo  3. Heart murmur - obtain echo      Arnoldo Lenis, M.D.

## 2015-10-06 NOTE — Patient Instructions (Signed)
Your physician recommends that you schedule a follow-up appointment TO BE DETERMINED   Your physician recommends that you continue on your current medications as directed. Please refer to the Current Medication list given to you today.  Your physician has requested that you have an echocardiogram. Echocardiography is a painless test that uses sound waves to create images of your heart. It provides your doctor with information about the size and shape of your heart and how well your heart's chambers and valves are working. This procedure takes approximately one hour. There are no restrictions for this procedure.   Thank you for choosing [facility]!!

## 2015-10-10 ENCOUNTER — Encounter: Payer: 59 | Attending: General Surgery | Admitting: Dietician

## 2015-10-10 NOTE — Progress Notes (Addendum)
Supervised Weight Loss Class:  Appt start time: 1600   End time:  1630.  Patient was seen on 10/10/2015 Protein Shakes" Supervised Weight Loss Class at [facility].   Surgery type: sleeve gastrectomy Start weight at [facility]: 264 lbs Weight today: 265lbs Weight change: 1lbs gain  The following learning objectives were met by the patient during this class:  Objectives: -Review when protein shakes will be recommended in the surgery process -Identify criteria for acceptable protein shakes -Provide examples of acceptable and unacceptable protein shakes -Remind patients that taste preferences may change after surgery  Goals: -Find 1 or more protein shakes that meet the criteria  -Look for a shake that is acceptable for taste and budget  Handouts given: Protein shakes pamplet  Supplements given: [employer] protein shake, lot # [id number], exp 05/30/2016 Chocolate Unjury, lot # [id number], exp 12/2016

## 2015-10-19 ENCOUNTER — Ambulatory Visit (INDEPENDENT_AMBULATORY_CARE_PROVIDER_SITE_OTHER): Payer: 59

## 2015-10-19 ENCOUNTER — Other Ambulatory Visit: Payer: Self-pay

## 2015-10-19 DIAGNOSIS — R0602 Shortness of breath: Secondary | ICD-10-CM | POA: Diagnosis not present

## 2015-10-19 LAB — ECHOCARDIOGRAM COMPLETE
E decel time: 180 msec
E/e' ratio: 9.21
FS: 48 % — AB (ref 28–44)
IV/PV OW: 1
LA diam index: 1.64 cm/m2
LA vol index: 21.8 mL/m2
LA vol: 46.7 mL
LASIZE: 35 mm
LAVOLA4C: 50.4 mL
LDCA: 2.84 cm2
LEFT ATRIUM END SYS DIAM: 35 mm
LV E/e' medial: 9.21
LV E/e'average: 9.21
LV PW d: 9.63 mm — AB (ref 0.6–1.1)
LV TDI E'LATERAL: 11.4
LV TDI E'MEDIAL: 9.96
LV e' LATERAL: 11.4 cm/s
LV sys vol index: 9 mL/m2
LV sys vol: 20 mL (ref 14–42)
LVDIAVOL: 62 mL (ref 46–106)
LVDIAVOLIN: 29 mL/m2
LVOT VTI: 25.7 cm
LVOT diameter: 19 mm
LVOT peak grad rest: 7 mmHg
LVOT peak vel: 129 cm/s
LVOTSV: 73 mL
MV Dec: 180
MV pk E vel: 105 m/s
MVPG: 4 mmHg
MVPKAVEL: 76.6 m/s
RV TAPSE: 24.3 mm
Simpson's disk: 68
Stroke v: 42 ml

## 2015-10-21 ENCOUNTER — Telehealth: Payer: Self-pay | Admitting: *Deleted

## 2015-10-21 NOTE — Telephone Encounter (Signed)
-----   Message from Arnoldo Lenis, MD sent at 10/21/2015 11:09 AM EDT ----- Echo looks good, nothing from a heart standpoint to explain her symptoms, Can she see me in 1 month to reevaluate symptoms and discuss further   Zandra Abts MD

## 2015-10-21 NOTE — Telephone Encounter (Signed)
Pt aware - 1 month f/u scheduled - routed to pcp  

## 2015-11-14 ENCOUNTER — Encounter: Payer: 59 | Attending: General Surgery | Admitting: Dietician

## 2015-11-14 NOTE — Progress Notes (Signed)
Supervised Weight Loss Class:  Appt start time: 1600   End time:  1630.  Patient was seen on 11/14/2015 for the "Fluid" Supervised Weight Loss Class at the [facility].   Surgery type: sleeve gastrectomy Start weight at [facility]: 264 lbs Weight today: 266 lbs Weight change: 1 lbs gain  Objectives: -Discuss importance of fluid and hydration status after surgery -Identify appropriate fluids after surgery -Discuss importance of limiting caffeine in the perioperative period -Review importance of avoiding drinking while eating  Goals: -Begin to wean off of caffeine -Practice avoiding fluids 15 minutes before, during, and 30 minutes after meals -Begin to work your way up to 64 ounces of sugar free, caffeine free, non-carbonated fluids  Handouts given: "Fluid" handout

## 2015-11-29 ENCOUNTER — Encounter: Payer: Self-pay | Admitting: Gastroenterology

## 2015-11-30 ENCOUNTER — Encounter: Payer: Self-pay | Admitting: *Deleted

## 2015-11-30 ENCOUNTER — Encounter: Payer: Self-pay | Admitting: Cardiology

## 2015-11-30 ENCOUNTER — Ambulatory Visit (INDEPENDENT_AMBULATORY_CARE_PROVIDER_SITE_OTHER): Payer: 59 | Admitting: Cardiology

## 2015-11-30 VITALS — BP 116/77 | HR 70 | Ht 61.0 in | Wt 274.0 lb

## 2015-11-30 DIAGNOSIS — R0602 Shortness of breath: Secondary | ICD-10-CM | POA: Diagnosis not present

## 2015-11-30 DIAGNOSIS — R002 Palpitations: Secondary | ICD-10-CM

## 2015-11-30 NOTE — Patient Instructions (Signed)

## 2015-11-30 NOTE — Progress Notes (Signed)
Clinical Summary Patricia [name] is a 50 y.o.female seen today for follow up of the following medical probems.   1. Palpitations  - previous 7 day event monitor showed no signficant arrhythmias - no receurrent symptoms since last visit  2. OSA -compliant with cpap  3. SOB - reported symptoms of SOB and LE edema at last visit - echo 09/2015 LVEF [id number], normal diastolic function.  - since last visit symptoms are improved.         SH: works as Land with [employer] Past Medical History:  Diagnosis Date  . Chronic cervicitis   . GERD (gastroesophageal reflux disease)   . Hypertension   . IBS (irritable bowel syndrome)   . Kidney stones   . Menorrhagia   . Obesity      No Known Allergies   Current Outpatient Prescriptions  Medication Sig Dispense Refill  . fluticasone (FLONASE) 50 MCG/ACT nasal spray PLACE 2 SPRAYS INTO THE NOSE AT BEDTIME. 16 g 4  . KLOR-CON M20 20 MEQ tablet TAKE 1 TABLET EVERY DAY 30 tablet 11  . losartan-hydrochlorothiazide (HYZAAR) 100-12.5 MG tablet TAKE 1 TABLET BY MOUTH EVERY DAY 90 tablet 0  . Multiple Vitamin (MULTIVITAMIN) capsule Take 1 capsule by mouth daily.    Marland Kitchen omeprazole (PRILOSEC) 40 MG capsule TAKE 1 CAPSULE BY MOUTH ONCE A DAY 30 capsule 11  . Probiotic Product (PROBIOTIC DAILY PO) Take 1 tablet by mouth daily.     No current facility-administered medications for this visit.      Past Surgical History:  Procedure Laterality Date  . BREAST LUMPECTOMY Left    benign hamartoma  . CESAREAN SECTION    . COLONOSCOPY  10/19/2005  . ESOPHAGOGASTRODUODENOSCOPY  12/21/2008  . HYSTEROSCOPY W/D&C     resection of endometrial polyp  . POLYPECTOMY     uterus  . TUBAL LIGATION    . uterine ablation    . VAGINAL HYSTERECTOMY     fibroids     No Known Allergies    Family History  Problem Relation Age of Onset  . Hypertension Father   . Hypertension Mother   . Diabetes Mother   . Coronary artery  disease Mother   . Cancer Brother     carcinoid tumer of the appendix     Social History Ms. Rizo reports that she has never smoked. She has never used smokeless tobacco. Ms. Javins reports that she drinks alcohol.   Review of Systems CONSTITUTIONAL: No weight loss, fever, chills, weakness or fatigue.  HEENT: Eyes: No visual loss, blurred vision, double vision or yellow sclerae.No hearing loss, sneezing, congestion, runny nose or sore throat.  SKIN: No rash or itching.  CARDIOVASCULAR: per HPI RESPIRATORY: No shortness of breath, cough or sputum.  GASTROINTESTINAL: No anorexia, nausea, vomiting or diarrhea. No abdominal pain or blood.  GENITOURINARY: No burning on urination, no polyuria NEUROLOGICAL: No headache, dizziness, syncope, paralysis, ataxia, numbness or tingling in the extremities. No change in bowel or bladder control.  MUSCULOSKELETAL: No muscle, back pain, joint pain or stiffness.  LYMPHATICS: No enlarged nodes. No history of splenectomy.  PSYCHIATRIC: No history of depression or anxiety.  ENDOCRINOLOGIC: No reports of sweating, cold or heat intolerance. No polyuria or polydipsia.  Marland Kitchen   Physical Examination Vitals:   11/30/15 1505  BP: 116/77  Pulse: 70   Vitals:   11/30/15 1505  Weight: 274 lb (124.3 kg)  Height: 5\' 1"  (1.549 m)    Gen: resting comfortably, no  acute distress HEENT: no scleral icterus, pupils equal round and reactive, no palptable cervical adenopathy,  CV: RRR, 2/6 systolic murmur RUSB, no jvd Resp: Clear to auscultation bilaterally GI: abdomen is soft, non-tender, non-distended, normal bowel sounds, no hepatosplenomegaly MSK: extremities are warm, no edema.  Skin: warm, no rash Neuro:  no focal deficits Psych: appropriate affect   Diagnostic Studies 03/2013 Event Monitor 7 Days No symptoms, normal sinus rhythm  09/2015 echo Study Conclusions  - Left ventricle: The cavity size was normal. Wall thickness was   normal. Systolic  function was normal. The estimated ejection   fraction was in the range of 60% to 65%. Wall motion was normal;   there were no regional wall motion abnormalities. Left   ventricular diastolic function parameters were normal for the   patient&'s age. - Right atrium: Central venous pressure (est): 3 mm Hg. - Atrial septum: No defect or patent foramen ovale was identified. - Tricuspid valve: There was mild regurgitation. - Pulmonary arteries: PA peak pressure: 32 mm Hg (S). - Pericardium, extracardiac: There was no pericardial effusion.  Impressions:  - Normal LV wall thickness with LVEF 60-65%. Normal diastolic   function. Mild tricuspid regurgitation with PASP 32 mmHg.  Assessment and Plan    1. Palpitations  - benign event monitor, no recurrent symptoms.We will continue to monitor.   2. SOB - normal echo. Since last visit symptoms have resolved. No further workup at this time.    F/u 1year   Arnoldo Lenis, M.D.

## 2015-12-01 ENCOUNTER — Ambulatory Visit (INDEPENDENT_AMBULATORY_CARE_PROVIDER_SITE_OTHER): Payer: 59 | Admitting: Internal Medicine

## 2015-12-01 ENCOUNTER — Encounter: Payer: Self-pay | Admitting: Internal Medicine

## 2015-12-01 VITALS — BP 122/68 | HR 74 | Ht 61.0 in | Wt 269.0 lb

## 2015-12-01 DIAGNOSIS — K580 Irritable bowel syndrome with diarrhea: Secondary | ICD-10-CM | POA: Diagnosis not present

## 2015-12-01 MED ORDER — ONDANSETRON HCL 4 MG PO TABS: 4.0000 mg | ORAL_TABLET | Freq: Three times a day (TID) | ORAL | 0 refills | Status: DC | PRN

## 2015-12-01 NOTE — Patient Instructions (Signed)
   We have sent the following medications to your pharmacy for you to pick up at your convenience: zofran    Follow up with Dr Carlean Purl as needed.       I appreciate the opportunity to care for you. Silvano Rusk, MD, [facility]

## 2015-12-01 NOTE — Progress Notes (Signed)
   Patricia [name] 50 y.o. 1965-05-10 [id number]  Assessment & Plan:  Obesity, Class III, BMI 40-49.9 (morbid obesity) Pursuing gastric sleeve, I think that is very appropriate. I hope it results in significant weight loss and loss of comorbidities.  IRRITABLE BOWEL SYNDROME - diarrhea predominant Ondansetron trial, 4 mg before meals when necessary may help her diarrhea. When she does have her gastric sleeve and eats less roughage, and more protein I think things could improve though I told her I really can't predict the future but I think that the benefits of weight loss outweigh any potential IBS effects of the gastric sleeve.  I appreciate the opportunity to care for this patient. CC: Odette Fraction, MD Dr. Lurena Joiner Kinsinger  Subjective:   Chief Complaint: IBS, question effects of gastric sleeve  HPI The patient is here, she still has intermittent postprandial urgent defecation. Typically with roughage like cabbage, lettuce salads. She is about to have a gastric sleeve procedure for weight loss. She is wondering what effects might occur on her IBS i.e. Would it worsen in the face of the gastric sleeve. Medications, allergies, past medical history, past surgical history, family history and social history are reviewed and updated in the EMR.   Review of Systems Some back pain  Objective:   Physical Exam BP 122/68   Pulse 74   Ht 5\' 1"  (1.549 m)   Wt 269 lb (122 kg)   BMI 50.83 kg/m  No acute distress  15 minutes time spent with patient > half in counseling coordination of care

## 2015-12-01 NOTE — Assessment & Plan Note (Addendum)
Ondansetron trial, 4 mg before meals when necessary may help her diarrhea. When she does have her gastric sleeve and eats less roughage, and more protein I think things could improve though I told her I really can't predict the future but I think that the benefits of weight loss outweigh any potential IBS effects of the gastric sleeve.

## 2015-12-01 NOTE — Assessment & Plan Note (Addendum)
Pursuing gastric sleeve, I think that is very appropriate. I hope it results in significant weight loss and loss of comorbidities.

## 2015-12-12 ENCOUNTER — Encounter: Payer: 59 | Attending: General Surgery | Admitting: Dietician

## 2015-12-12 NOTE — Progress Notes (Signed)
Supervised Weight Loss Class:  Appt start time: 1600   End time:  1630.  Patient was seen on 12/12/2015 for the "Exercise" Supervised Weight Loss Class at [facility].   Surgery type: sleeve gastrectomy Start weight at [facility]: 264 lbs Weight today: 271.0 lbs  Weight change: 5 lbs gain  The following learning objectives were met by the patient during this class:  Objectives: -Discuss importance of exercise  -Helps burn calories  -Preserves/builds lean mass  -Stress relief  -Weight maintenance -Explain BELT program details  Goals: -Find an exercise/activity that you enjoy -Begin to develop an exercise routine  Handouts given: Exercise brochure

## 2015-12-22 ENCOUNTER — Ambulatory Visit (INDEPENDENT_AMBULATORY_CARE_PROVIDER_SITE_OTHER): Payer: 59 | Admitting: Family Medicine

## 2015-12-22 ENCOUNTER — Encounter: Payer: Self-pay | Admitting: Family Medicine

## 2015-12-22 ENCOUNTER — Ambulatory Visit
Admission: RE | Admit: 2015-12-22 | Discharge: 2015-12-22 | Disposition: A | Discharge status: Home / Self Care | Payer: 59 | Source: Ambulatory Visit | Attending: Family Medicine | Admitting: Family Medicine

## 2015-12-22 VITALS — BP 128/74 | HR 82 | Temp 98.1°F | Resp 18 | Ht 61.5 in | Wt 268.0 lb

## 2015-12-22 DIAGNOSIS — M5431 Sciatica, right side: Secondary | ICD-10-CM

## 2015-12-22 MED ORDER — PREDNISONE 20 MG PO TABS: ORAL_TABLET | ORAL | 0 refills | Status: DC

## 2015-12-22 NOTE — Progress Notes (Signed)
Subjective:    Patient ID: Patricia [name], female    DOB: 1965/05/19, 50 y.o.   MRN: [id number]  HPI Patient is a very pleasant 50 year old African-American female who presents with several months of deep aching pain radiating from her right gluteus down her right leg into her right thigh below her right knee. The pain is worse at night when she lies down. She is unable to get comfortable. During the day if she stands up and walks around the pain will sometimes improve. I did perform an MRI of her lumbar spine in 2013. At that time she had a small bulging disc between L5-S1 that was causing no neural compression. However even then she was complaining of weakness and pain in her right leg she denies any falls or injuries. She cannot pinpoint the date when this started. She comes today because the pain is gradually getting worse to the point that she is unable to sleep at night. She denies any bowel or bladder incontinence. She denies any saddle anesthesia. Past Medical History:  Diagnosis Date  . Chronic cervicitis   . GERD (gastroesophageal reflux disease)   . Hypertension   . IBS (irritable bowel syndrome)   . Kidney stones   . Menorrhagia   . Obesity    Past Surgical History:  Procedure Laterality Date  . BREAST LUMPECTOMY Left    benign hamartoma  . CESAREAN SECTION    . COLONOSCOPY  10/19/2005  . ESOPHAGOGASTRODUODENOSCOPY  12/21/2008  . HYSTEROSCOPY W/D&C     resection of endometrial polyp  . POLYPECTOMY     uterus  . TUBAL LIGATION    . uterine ablation    . VAGINAL HYSTERECTOMY     fibroids   Current Outpatient Prescriptions on File Prior to Visit  Medication Sig Dispense Refill  . fluticasone (FLONASE) 50 MCG/ACT nasal spray PLACE 2 SPRAYS INTO THE NOSE AT BEDTIME. 16 g 4  . KLOR-CON M20 20 MEQ tablet TAKE 1 TABLET EVERY DAY 30 tablet 11  . losartan-hydrochlorothiazide (HYZAAR) 100-12.5 MG tablet TAKE 1 TABLET BY MOUTH EVERY DAY 90 tablet 0  . Multiple Vitamin  (MULTIVITAMIN) capsule Take 1 capsule by mouth daily.    Marland Kitchen omeprazole (PRILOSEC) 40 MG capsule TAKE 1 CAPSULE BY MOUTH ONCE A DAY 30 capsule 11  . ondansetron (ZOFRAN) 4 MG tablet Take 1 tablet (4 mg total) by mouth every 8 (eight) hours as needed for nausea or vomiting (and as directed before meals). 60 tablet 0  . Probiotic Product (PROBIOTIC DAILY PO) Take 1 tablet by mouth daily.     No current facility-administered medications on file prior to visit.    No Known Allergies Social History   Social History  . Marital status: Married    Spouse name: Montine Circle  . Number of children: 2  . Years of education: 16   Occupational History  . 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 Smoking status: Never Smoker  . Smokeless tobacco: Never Used  . Alcohol use 0.0 oz/week     Comment: socially  . Drug use: No  . Sexual activity: Not on file   Other Topics Concern  . Not on file   Social History Narrative   Patient is right handed and consumes green tea daily      Review of Systems  All other systems reviewed and are negative.      Objective:   Physical Exam  Cardiovascular: Normal rate, regular rhythm and  normal heart sounds.   Pulmonary/Chest: Effort normal and breath sounds normal.  Musculoskeletal:       Lumbar back: She exhibits normal range of motion, no tenderness and no pain.  Neurological: She has normal strength. No cranial nerve deficit or sensory deficit. She displays a negative Romberg sign.  Vitals reviewed.         Assessment & Plan:  Sciatica of right side - Plan: predniSONE (DELTASONE) 20 MG tablet, DG Lumbar Spine Complete  Symptoms sound consistent with sciatica. Straight leg raise however is negative. She has full internal and external rotation of the hip. There is no tenderness to palpation over the greater trochanteric bursa. IT band syndrome is also a possibility however she has no lateral knee pain. Pain does not sound  vascular in nature. She has full range of motion of the hip and knee without pain. She does have some tenderness to palpation over the lateral hamstring and lateral quad. However her pain seems too widespread to be muscle tenderness alone. Begin with an x-ray of the lumbar spine. Start prednisone taper pack.

## 2015-12-28 ENCOUNTER — Encounter: Payer: Self-pay | Admitting: Family Medicine

## 2016-01-06 ENCOUNTER — Encounter: Payer: Self-pay | Admitting: Family Medicine

## 2016-01-06 ENCOUNTER — Ambulatory Visit (INDEPENDENT_AMBULATORY_CARE_PROVIDER_SITE_OTHER): Payer: 59 | Admitting: Family Medicine

## 2016-01-06 VITALS — BP 126/82 | HR 88 | Temp 98.1°F | Resp 16 | Ht 61.5 in | Wt 272.0 lb

## 2016-01-06 DIAGNOSIS — L03011 Cellulitis of right finger: Secondary | ICD-10-CM

## 2016-01-06 MED ORDER — INDOMETHACIN 25 MG PO CAPS
50.0000 mg | ORAL_CAPSULE | Freq: Three times a day (TID) | ORAL | 0 refills | Status: DC
Start: 2016-01-06 — End: 2016-02-27

## 2016-01-06 MED ORDER — SULFAMETHOXAZOLE-TRIMETHOPRIM 800-160 MG PO TABS: 1.0000 | ORAL_TABLET | Freq: Two times a day (BID) | ORAL | 0 refills | Status: DC

## 2016-01-06 NOTE — Progress Notes (Signed)
Subjective:    Patient ID: Patricia [name], female    DOB: 06-05-1965, 50 y.o.   MRN: [id number]  HPI Patient does have a history of gout. However her last gout exacerbation was many many years ago and was in her toe. Over the last 2 days, her right thumb has become red hot swollen and extremely painful distal to the IP joint. She denies any injury although she did have a manicure 2 weeks ago. The skin is bright red erythematous warm and painful. She also has pain with range of motion in the IP joint Past Medical History:  Diagnosis Date  . Chronic cervicitis   . GERD (gastroesophageal reflux disease)   . Hypertension   . IBS (irritable bowel syndrome)   . Kidney stones   . Menorrhagia   . Obesity    Past Surgical History:  Procedure Laterality Date  . BREAST LUMPECTOMY Left    benign hamartoma  . CESAREAN SECTION    . COLONOSCOPY  10/19/2005  . ESOPHAGOGASTRODUODENOSCOPY  12/21/2008  . HYSTEROSCOPY W/D&C     resection of endometrial polyp  . POLYPECTOMY     uterus  . TUBAL LIGATION    . uterine ablation    . VAGINAL HYSTERECTOMY     fibroids   Current Outpatient Prescriptions on File Prior to Visit  Medication Sig Dispense Refill  . fluticasone (FLONASE) 50 MCG/ACT nasal spray PLACE 2 SPRAYS INTO THE NOSE AT BEDTIME. 16 g 4  . KLOR-CON M20 20 MEQ tablet TAKE 1 TABLET EVERY DAY 30 tablet 11  . losartan-hydrochlorothiazide (HYZAAR) 100-12.5 MG tablet TAKE 1 TABLET BY MOUTH EVERY DAY 90 tablet 0  . Multiple Vitamin (MULTIVITAMIN) capsule Take 1 capsule by mouth daily.    Marland Kitchen omeprazole (PRILOSEC) 40 MG capsule TAKE 1 CAPSULE BY MOUTH ONCE A DAY 30 capsule 11  . ondansetron (ZOFRAN) 4 MG tablet Take 1 tablet (4 mg total) by mouth every 8 (eight) hours as needed for nausea or vomiting (and as directed before meals). 60 tablet 0  . Probiotic Product (PROBIOTIC DAILY PO) Take 1 tablet by mouth daily.     No current facility-administered medications on file prior to visit.    No  Known Allergies Social History   Social History  . Marital status: Married    Spouse name: Montine Circle  . Number of children: 2  . Years of education: 16   Occupational History  . 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 Smoking status: Never Smoker  . Smokeless tobacco: Never Used  . Alcohol use 0.0 oz/week     Comment: socially  . Drug use: No  . Sexual activity: Not on file   Other Topics Concern  . Not on file   Social History Narrative   Patient is right handed and consumes green tea daily      Review of Systems  All other systems reviewed and are negative.      Objective:   Physical Exam  Cardiovascular: Normal rate and regular rhythm.   Pulmonary/Chest: Effort normal and breath sounds normal.  Musculoskeletal:       Hands: Skin: Skin is warm. There is erythema.  Vitals reviewed.         Assessment & Plan:  Cellulitis of thumb, right - Plan: sulfamethoxazole-trimethoprim (BACTRIM DS,SEPTRA DS) 800-160 MG tablet, indomethacin (INDOCIN) 25 MG capsule  Right thumb is erythematous hot painful and warm distal to the IP joint. She has pain with range  of motion in the IP joint. Differential diagnosis is gout exacerbation versus cellulitis versus septic arthritis. My clinical impression is this is more consistent with cellulitis. Begin Bactrim double strength tablets 1 by mouth twice a day for 7 days. I will give the patient Indocin 50 mg every 8 hours when necessary pain as I do not believe that this would exacerbate cellulitis and it may also help in case this is gout. Recheck on Monday or seek medical attention immediately if worsening

## 2016-01-09 ENCOUNTER — Ambulatory Visit: Payer: 59 | Admitting: Family Medicine

## 2016-01-09 ENCOUNTER — Encounter: Payer: 59 | Attending: General Surgery | Admitting: Dietician

## 2016-01-10 ENCOUNTER — Encounter: Payer: Self-pay | Admitting: Dietician

## 2016-01-10 NOTE — Progress Notes (Signed)
Supervised Weight Loss Class:  Appt start time: 1600   End time:  1630.  Patient was seen on 01/09/2016 for the "Vitamins" Supervised Weight Loss Class at [facility].   Surgery type: sleeve gastrectomy Start weight at [facility]: 264 lbs Weight today: 275.4 lbs Weight change: 4.4 lbs gain  The following learning objectives were met by the patient during this class:  Objectives: -Review vitamin and Calcium recommendations based on procedure -Identify appropriate types/brands of vitamins and Calcium -Reinforce the need for chewable or liquid supplements  Goals: -Find appropriate supplements that meet taste and budget needs -Begin taking a recommended multivitamin and Calcium supplement  Handouts given: "Vitamins" handout

## 2016-01-16 ENCOUNTER — Ambulatory Visit (INDEPENDENT_AMBULATORY_CARE_PROVIDER_SITE_OTHER): Payer: 59 | Admitting: Family Medicine

## 2016-01-16 ENCOUNTER — Encounter: Payer: Self-pay | Admitting: Family Medicine

## 2016-01-16 VITALS — BP 126/80 | HR 78 | Temp 97.8°F | Resp 18 | Ht 61.5 in | Wt 272.0 lb

## 2016-01-16 DIAGNOSIS — M1000 Idiopathic gout, unspecified site: Secondary | ICD-10-CM

## 2016-01-16 MED ORDER — PREDNISONE 20 MG PO TABS: ORAL_TABLET | ORAL | 0 refills | Status: DC

## 2016-01-16 NOTE — Progress Notes (Signed)
Subjective:    Patient ID: Patricia [name], female    DOB: 08/17/1965, 50 y.o.   MRN: [id number]  HPI  01/16/16 Patient does have a history of gout. However her last gout exacerbation was many many years ago and was in her toe. Over the last 2 days, her right thumb has become red hot swollen and extremely painful distal to the IP joint. She denies any injury although she did have a manicure 2 weeks ago. The skin is bright red erythematous warm and painful. She also has pain with range of motion in the IP joint.  AT that time, my plan was: Right thumb is erythematous hot painful and warm distal to the IP joint. She has pain with range of motion in the IP joint. Differential diagnosis is gout exacerbation versus cellulitis versus septic arthritis. My clinical impression is this is more consistent with cellulitis. Begin Bactrim double strength tablets 1 by mouth twice a day for 7 days. I will give the patient Indocin 50 mg every 8 hours when necessary pain as I do not believe that this would exacerbate cellulitis and it may also help in case this is gout. Recheck on Monday or seek medical attention immediately if worsening  01/16/16 Erythema has completely subsided. There is no warmth. However patient continues to complain of pain in the IP joint on her right thumb. This is beginning to behave more more like an acute gout attack. Visibly there is no deformity and the thumb. However she has limited range of motion at the IP joint and pain with flexion Past Medical History:  Diagnosis Date  . Chronic cervicitis   . GERD (gastroesophageal reflux disease)   . Hypertension   . IBS (irritable bowel syndrome)   . Kidney stones   . Menorrhagia   . Obesity    Past Surgical History:  Procedure Laterality Date  . BREAST LUMPECTOMY Left    benign hamartoma  . CESAREAN SECTION    . COLONOSCOPY  10/19/2005  . ESOPHAGOGASTRODUODENOSCOPY  12/21/2008  . HYSTEROSCOPY W/D&C     resection of endometrial polyp    . POLYPECTOMY     uterus  . TUBAL LIGATION    . uterine ablation    . VAGINAL HYSTERECTOMY     fibroids   Current Outpatient Prescriptions on File Prior to Visit  Medication Sig Dispense Refill  . fluticasone (FLONASE) 50 MCG/ACT nasal spray PLACE 2 SPRAYS INTO THE NOSE AT BEDTIME. 16 g 4  . KLOR-CON M20 20 MEQ tablet TAKE 1 TABLET EVERY DAY 30 tablet 11  . losartan-hydrochlorothiazide (HYZAAR) 100-12.5 MG tablet TAKE 1 TABLET BY MOUTH EVERY DAY 90 tablet 0  . Multiple Vitamin (MULTIVITAMIN) capsule Take 1 capsule by mouth daily.    Marland Kitchen omeprazole (PRILOSEC) 40 MG capsule TAKE 1 CAPSULE BY MOUTH ONCE A DAY 30 capsule 11  . ondansetron (ZOFRAN) 4 MG tablet Take 1 tablet (4 mg total) by mouth every 8 (eight) hours as needed for nausea or vomiting (and as directed before meals). 60 tablet 0  . Probiotic Product (PROBIOTIC DAILY PO) Take 1 tablet by mouth daily.    . indomethacin (INDOCIN) 25 MG capsule Take 2 capsules (50 mg total) by mouth 3 (three) times daily with meals. (Patient not taking: Reported on 01/16/2016) 30 capsule 0   No current facility-administered medications on file prior to visit.    No Known Allergies Social History   Social History  . Marital status: Married    Spouse  name: Montine Circle  . Number of children: 2  . Years of education: 16   Occupational History  . 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 Smoking status: Never Smoker  . Smokeless tobacco: Never Used  . Alcohol use 0.0 oz/week     Comment: socially  . Drug use: No  . Sexual activity: Not on file   Other Topics Concern  . Not on file   Social History Narrative   Patient is right handed and consumes green tea daily      Review of Systems  All other systems reviewed and are negative.      Objective:   Physical Exam  Cardiovascular: Normal rate and regular rhythm.   Pulmonary/Chest: Effort normal and breath sounds normal.  Musculoskeletal:       Hands: Skin:  Skin is warm. No erythema.  Vitals reviewed.         Assessment & Plan:  Acute idiopathic gout, unspecified site - Plan: predniSONE (DELTASONE) 20 MG tablet Begin prednisone taper pack for I suspect is a gout exacerbation in her right thumb. Recheck in one week if not completely better or sooner if worse

## 2016-02-01 ENCOUNTER — Other Ambulatory Visit: Payer: Self-pay | Admitting: Family Medicine

## 2016-02-03 ENCOUNTER — Other Ambulatory Visit: Payer: Self-pay | Admitting: Family Medicine

## 2016-02-07 LAB — HM MAMMOGRAPHY

## 2016-02-08 ENCOUNTER — Encounter: Payer: Self-pay | Admitting: Family Medicine

## 2016-02-23 NOTE — Progress Notes (Signed)
Need orders in EPIC.  Surgery on 12/18.  preop on 12/12.  Thank You

## 2016-02-27 ENCOUNTER — Ambulatory Visit (INDEPENDENT_AMBULATORY_CARE_PROVIDER_SITE_OTHER): Payer: 59 | Admitting: Family Medicine

## 2016-02-27 ENCOUNTER — Encounter: Payer: 59 | Attending: General Surgery | Admitting: Dietician

## 2016-02-27 ENCOUNTER — Encounter: Payer: Self-pay | Admitting: Dietician

## 2016-02-27 ENCOUNTER — Encounter: Payer: Self-pay | Admitting: Family Medicine

## 2016-02-27 VITALS — BP 130/74 | HR 74 | Temp 99.2°F | Resp 18 | Ht 61.5 in | Wt 275.0 lb

## 2016-02-27 DIAGNOSIS — G8929 Other chronic pain: Secondary | ICD-10-CM

## 2016-02-27 DIAGNOSIS — I10 Essential (primary) hypertension: Secondary | ICD-10-CM | POA: Diagnosis not present

## 2016-02-27 DIAGNOSIS — M6530 Trigger finger, unspecified finger: Secondary | ICD-10-CM | POA: Diagnosis not present

## 2016-02-27 DIAGNOSIS — M79644 Pain in right finger(s): Secondary | ICD-10-CM | POA: Diagnosis not present

## 2016-02-27 NOTE — Progress Notes (Signed)
  Pre-Operative Nutrition Class:  Appt start time: 830   End time:  1015  Patient was seen on 02/27/2016 for Pre-Operative Bariatric Surgery Education at [facility].   Surgery date: 03/12/2016 Surgery type: Sleeve gastrectomy Start weight at [facility]: 275.4 lbs on 01/09/2016 Weight today: 272.2 lbs  TANITA  BODY COMP RESULTS  02/27/16   BMI (kg/m^2) 51.4   Fat Mass (lbs) 156.2   Fat Free Mass (lbs) 116   Total Body Water (lbs) 86.2   Samples given per MNT protocol. Patient educated on appropriate usage: Premier protein shake (vanilla - qty 1) Lot #: [id number] Exp: 12/2016  Celebrate Vitamins Calcium Citrate chew (watermelon - qty 1) Lot #: [id number] Exp: 06/2017  Unjury Protein Powder (chicken soup - qty 1) Lot #: [id number] Exp: 05/2017  The following the learning objectives were met by the patient during this course:  Identify Pre-Op Dietary Goals and will begin 2 weeks pre-operatively  Identify appropriate sources of fluids and proteins   State protein recommendations and appropriate sources pre and post-operatively  Identify Post-Operative Dietary Goals and will follow for 2 weeks post-operatively  Identify appropriate multivitamin and calcium sources  Describe the need for physical activity post-operatively and will follow MD recommendations  State when to call healthcare provider regarding medication questions or post-operative complications  Handouts given during class include:  Pre-Op Bariatric Surgery Diet Handout  Protein Shake Handout  Post-Op Bariatric Surgery Nutrition Handout  BELT Program Information Flyer  Support Group Information Flyer  [facility] Bariatric Supplements Price List  Follow-Up Plan: Patient will follow-up at [facility] 2 weeks post operatively for diet advancement per MD.

## 2016-02-28 LAB — CBC WITH DIFFERENTIAL/PLATELET
Basophils Absolute: 62 cells/uL (ref 0–200)
Basophils Relative: 1 %
EOS ABS: 124 {cells}/uL (ref 15–500)
Eosinophils Relative: 2 %
HEMATOCRIT: 41.2 % (ref 35.0–45.0)
Hemoglobin: 13.5 g/dL (ref 12.0–15.0)
LYMPHS PCT: 39 %
Lymphs Abs: 2418 cells/uL (ref 850–3900)
MCH: 27.4 pg (ref 27.0–33.0)
MCHC: 32.8 g/dL (ref 32.0–36.0)
MCV: 83.7 fL (ref 80.0–100.0)
MONO ABS: 558 {cells}/uL (ref 200–950)
MONOS PCT: 9 %
MPV: 9.4 fL (ref 7.5–12.5)
NEUTROS PCT: 49 %
Neutro Abs: 3038 cells/uL (ref 1500–7800)
PLATELETS: 290 10*3/uL (ref 140–400)
RBC: 4.92 MIL/uL (ref 3.80–5.10)
RDW: 15.1 % — AB (ref 11.0–15.0)
WBC: 6.2 10*3/uL (ref 3.8–10.8)

## 2016-02-28 LAB — COMPLETE METABOLIC PANEL WITH GFR
ALT: 14 U/L (ref 6–29)
AST: 16 U/L (ref 10–35)
Albumin: 4.2 g/dL (ref 3.6–5.1)
Alkaline Phosphatase: 84 U/L (ref 33–115)
BILIRUBIN TOTAL: 0.3 mg/dL (ref 0.2–1.2)
BUN: 13 mg/dL (ref 7–25)
CALCIUM: 9.5 mg/dL (ref 8.6–10.2)
CHLORIDE: 104 mmol/L (ref 98–110)
CO2: 28 mmol/L (ref 20–31)
CREATININE: 0.63 mg/dL (ref 0.50–1.10)
GFR, Est Non African American: >89 mL/min (ref >=60)
Glucose, Bld: 92 mg/dL (ref 70–99)
Potassium: 4 mmol/L (ref 3.5–5.3)
Sodium: 141 mmol/L (ref 135–146)
TOTAL PROTEIN: 6.9 g/dL (ref 6.1–8.1)

## 2016-02-28 NOTE — Progress Notes (Signed)
Subjective:    Patient ID: Patricia [name], female    DOB: [birth year], 50 y.o.   MRN: [id number]  HPI  01/16/16 Patient does have a history of gout. However her last gout exacerbation was many many years ago and was in her toe. Over the last 2 days, her right thumb has become red hot swollen and extremely painful distal to the IP joint. She denies any injury although she did have a manicure 2 weeks ago. The skin is bright red erythematous warm and painful. She also has pain with range of motion in the IP joint.  AT that time, my plan was: Right thumb is erythematous hot painful and warm distal to the IP joint. She has pain with range of motion in the IP joint. Differential diagnosis is gout exacerbation versus cellulitis versus septic arthritis. My clinical impression is this is more consistent with cellulitis. Begin Bactrim double strength tablets 1 by mouth twice a day for 7 days. I will give the patient Indocin 50 mg every 8 hours when necessary pain as I do not believe that this would exacerbate cellulitis and it may also help in case this is gout. Recheck on Monday or seek medical attention immediately if worsening  01/16/16 Erythema has completely subsided. There is no warmth. However patient continues to complain of pain in the IP joint on her right thumb. This is beginning to behave more more like an acute gout attack. Visibly there is no deformity and the thumb. However she has limited range of motion at the IP joint and pain with flexion.  At thattime, my plan was: Begin prednisone taper pack for I suspect is a gout exacerbation in her right thumb. Recheck in one week if not completely better or sooner if worse  02/27/16 Erythema improved rapidly after prednisone. Warmth and pain improved as well. On exam today, the right thumb is normal in color and in temperature. There is no visible or palpable swelling around the IP joint. However the patient continues to complain of pain with flexion  and the IP joint. Furthermore there is crepitus and a popping sensation with flexion of the IP joint. The popping sensation is palpable today on exam and similar to what one would expect with a trigger finger. Patient reports subjectively decreased range of motion. On passive examination, I am able to elicit full range of motion IP joint albeit there is a popping sensation on the volar surface of the joint that elicits pain Past Medical History:  Diagnosis Date  . Chronic cervicitis   . GERD (gastroesophageal reflux disease)   . Hypertension   . IBS (irritable bowel syndrome)   . Kidney stones   . Menorrhagia   . Obesity    Past Surgical History:  Procedure Laterality Date  . BREAST LUMPECTOMY Left    benign hamartoma  . CESAREAN SECTION    . COLONOSCOPY  10/19/2005  . ESOPHAGOGASTRODUODENOSCOPY  12/21/2008  . HYSTEROSCOPY W/D&C     resection of endometrial polyp  . POLYPECTOMY     uterus  . TUBAL LIGATION    . uterine ablation    . VAGINAL HYSTERECTOMY     fibroids   Current Outpatient Prescriptions on File Prior to Visit  Medication Sig Dispense Refill  . fluticasone (FLONASE) 50 MCG/ACT nasal spray PLACE 2 SPRAYS INTO THE NOSE AT BEDTIME. 16 g 4  . KLOR-CON M20 20 MEQ tablet TAKE 1 TABLET EVERY DAY 30 tablet 11  . losartan-hydrochlorothiazide (HYZAAR) 100-12.5 MG  tablet TAKE 1 TABLET EVERY DAY 90 tablet 0  . Multiple Vitamin (MULTIVITAMIN) capsule Take 1 capsule by mouth daily.    Marland Kitchen omeprazole (PRILOSEC) 40 MG capsule TAKE 1 CAPSULE BY MOUTH ONCE A DAY 30 capsule 11  . ondansetron (ZOFRAN) 4 MG tablet Take 1 tablet (4 mg total) by mouth every 8 (eight) hours as needed for nausea or vomiting (and as directed before meals). 60 tablet 0  . Probiotic Product (PROBIOTIC DAILY PO) Take 1 tablet by mouth daily.     No current facility-administered medications on file prior to visit.    No Known Allergies Social History   Social History  . Marital status: Married    Spouse name:  Montine Circle  . Number of children: 2  . Years of education: 16   Occupational History  . 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 Smoking status: Never Smoker  . Smokeless tobacco: Never Used  . Alcohol use 0.0 oz/week     Comment: socially  . Drug use: No  . Sexual activity: Not on file   Other Topics Concern  . Not on file   Social History Narrative   Patient is right handed and consumes green tea daily      Review of Systems  All other systems reviewed and are negative.      Objective:   Physical Exam  Cardiovascular: Normal rate and regular rhythm.   Pulmonary/Chest: Effort normal and breath sounds normal.  Musculoskeletal:       Hands: Skin: Skin is warm. No erythema.  Vitals reviewed.         Assessment & Plan:  Benign essential HTN - Plan: COMPLETE METABOLIC PANEL WITH GFR, CBC with Differential/Platelet  Chronic pain of right thumb - Plan: Ambulatory referral to Hand Surgery  Trigger finger, acquired - Plan: Ambulatory referral to Hand Surgery Patient's blood pressure today is well controlled. However she is overdue for a CBC as well as a CMP to monitor her electrolytes on her blood pressure medication. I'll obtain a CBC to monitor her hemoglobin prior to her upcoming gastric bypass surgery. I believe the patient has developed a trigger finger likely secondary to scar tissue on the volar surface of the IP joint. I believe she will benefit from a cortisone injection at the IP joint. This is not a procedure that I performed. I would like to obtain an x-ray of the thumb however since I will refer her to a hand surgeon for possible cortisone injection at the IP joint, I am certain that they're going to perform a hand x-ray there. Rather than have the patient paid for the same procedure twice, I will defer imaging to the hand specialist. I have a low index of suspicion for any fracture. I believe the patient's pain and symptoms are secondary to  scar tissue on the flexor tendon at the volar surface of the IP joint.

## 2016-02-29 ENCOUNTER — Encounter: Payer: Self-pay | Admitting: Family Medicine

## 2016-03-01 ENCOUNTER — Ambulatory Visit: Payer: Self-pay | Admitting: General Surgery

## 2016-03-01 NOTE — Progress Notes (Signed)
Please PLACE SURGICAL ORDERS IN EPIC--HAS PRE OP APPT

## 2016-03-02 ENCOUNTER — Ambulatory Visit: Payer: Self-pay | Admitting: General Surgery

## 2016-03-02 NOTE — H&P (Signed)
History of Present Illness Geoffery Spruce MD; 03/02/2016 12:13 PM) The patient is a 50 year old female who presents for a bariatric surgery evaluation. Note for "Bariatric surgery evaluation": Patient is completed all necessary workup in the preoperative phase for bariatric surgery. The patient is now ready to proceed with sleeve gastrectomy. The patient has made moderate improvements in her diet and is exercising well and has no new medical issues or new medications.  She notes taking the large potassium pill and will go to pharmacy to look at other options there.   Allergies (Sonya Bynum, CMA; 03/02/2016 11:45 AM) No Known Drug Allergies01/20/2017  Medication History (Sonya Bynum, CMA; 03/02/2016 11:45 AM) Omeprazole (40MG  Capsule DR, Oral) Active. Hyzaar (50-12.5MG  Tablet, Oral) Active. Potassium (75MG  Tablet, Oral) Active. Probiotic Product (Oral) Active. Medications Reconciled  Vitals (Sonya Bynum CMA; 03/02/2016 11:45 AM) 03/02/2016 11:44 AM Weight: 274 lb Height: 61in Body Surface Area: 2.16 m Body Mass Index: 51.77 kg/m  Temp.: 97.42F(Temporal)  Pulse: 75 (Regular)  BP: 122/74 (Sitting, Left Arm, Standard)       Physical Exam Geoffery Spruce, MD; 03/02/2016 12:13 PM) General Mental Status-Alert. General Appearance-Cooperative. Orientation-Oriented X4. Build & Nutrition-Obese. Posture-Normal posture.  Integumentary Global Assessment Normal Exam - Head/Face: no rashes, ulcers, lesions or evidence of photo damage. No palpable nodules or masses and Neck: no visible lesions or palpable masses.  Head and Neck Head-normocephalic, atraumatic with no lesions or palpable masses. Face Global Assessment - atraumatic. Thyroid Gland Characteristics - normal size and consistency.  Eye Eyeball - Bilateral-Extraocular movements intact. Sclera/Conjunctiva - Bilateral-No scleral icterus, No Discharge.  ENMT Nose and Sinuses Nose - no  deformities observed, no swelling present.  Chest and Lung Exam Palpation Normal exam - Non-tender. Auscultation Breath sounds - Normal.  Cardiovascular Auscultation Rhythm - Regular. Heart Sounds - S1 WNL and S2 WNL. Carotid arteries - No Carotid bruit.  Abdomen Inspection Normal Exam - No Visible peristalsis, No Abnormal pulsations and No Paradoxical movements. Palpation/Percussion Normal exam - Soft, Non Tender, No Rebound tenderness, No Rigidity (guarding), No hepatosplenomegaly and No Palpable abdominal masses.  Peripheral Vascular Upper Extremity Palpation - Pulses bilaterally normal. Lower Extremity Palpation - Edema - Bilateral - No edema.  Neurologic Neurologic evaluation reveals -normal sensation and normal coordination.  Neuropsychiatric Mental status exam performed with findings of-able to articulate well with normal speech/language, rate, volume and coherence and thought content normal with ability to perform basic computations and apply abstract reasoning.  Musculoskeletal Normal Exam - Bilateral-Upper Extremity Strength Normal and Lower Extremity Strength Normal.    Assessment & Plan Geoffery Spruce MD; 03/02/2016 12:14 PM) MORBID OBESITY (E66.01) Story: 50 yo female with morbid obesity, IBS, reflux, and OSA.  The patient meets weight loss surgery criteria. Due to the above reasons, I think laparoscopic vertical sleeve gastrectomy is the best option for the patient.  BMI 51.77 Impression: We again discussed the details of the operation: Will be done under general anesthesia with an endotracheal tube, that it will be a laparoscopic procedure with 6 small incisions large one being 1.5-2in, that we will remove remove the short gastrics and other vessels to the greater curve of the stomach, place a large Bougie down the stomach and then remove a percent of the stomach using a linear stapler. We discussed the procedure will take approximately 1.5-2 hours.  We discussed risks of VTE, staple line leak, skin infection, sleeve stenosis, incisional hernia, need for open procedure, and postoperative nausea and vomiting. Current Plans Started  OxyCODONE HCl 5MG /5ML, 5-10 Milliliter every four hours, as needed, 100 Milliliter, 03/02/2016, No Refill. Started Zofran ODT 4MG , 1 (one) Tablet Disperse every six hours, as needed, #15, 03/02/2016, No Refill.

## 2016-03-05 NOTE — Patient Instructions (Addendum)
Patricia [name]  03/05/2016   Your procedure is scheduled on: Monday 03/12/2016  Report to [facility] Main  Entrance take [place]  elevators to 3rd floor to  [place] at  0730 AM.  Call this number if you have problems the morning of surgery [phone]   Remember: ONLY 1 PERSON MAY GO WITH YOU TO SHORT STAY TO GET  READY MORNING OF [place].  Bring cpap mask/tubing.  Do not eat food or drink liquids :After Midnight.     Take these medicines the morning of surgery with A SIP OF WATER: None- Flonase -if need.                                 You may not have any metal on your body including hair pins and              piercings  Do not wear jewelry, make-up, lotions, powders or perfumes, deodorant             Do not wear nail polish.  Do not shave  48 hours prior to surgery.              Men may shave face and neck.   Do not bring valuables to the hospital. [place].  Contacts, dentures or bridgework may not be worn into surgery.  Leave suitcase in the car. After surgery it may be brought to your room.                  Please read over the following fact sheets you were given: _____________________________________________________________________             [facility] - Preparing for Surgery Before surgery, you can play an important role.  Because skin is not sterile, your skin needs to be as free of germs as possible.  You can reduce the number of germs on your skin by washing with CHG (chlorahexidine gluconate) soap before surgery.  CHG is an antiseptic cleaner which kills germs and bonds with the skin to continue killing germs even after washing. Please DO NOT use if you have an allergy to CHG or antibacterial soaps.  If your skin becomes reddened/irritated stop using the CHG and inform your nurse when you arrive at Short Stay. Do not shave (including legs and underarms) for at least 48 hours  prior to the first CHG shower.  You may shave your face/neck. Please follow these instructions carefully:  1.  Shower with CHG Soap the night before surgery and the  morning of Surgery.  2.  If you choose to wash your hair, wash your hair first as usual with your  normal  shampoo.  3.  After you shampoo, rinse your hair and body thoroughly to remove the  shampoo.                           4.  Use CHG as you would any other liquid soap.  You can apply chg directly  to the skin and wash                       Gently with a scrungie  or clean washcloth.  5.  Apply the CHG Soap to your body ONLY FROM THE NECK DOWN.   Do not use on face/ open                           Wound or open sores. Avoid contact with eyes, ears mouth and genitals (private parts).                       Wash face,  Genitals (private parts) with your normal soap.             6.  Wash thoroughly, paying special attention to the area where your surgery  will be performed.  7.  Thoroughly rinse your body with warm water from the neck down.  8.  DO NOT shower/wash with your normal soap after using and rinsing off  the CHG Soap.                9.  Pat yourself dry with a clean towel.            10.  Wear clean pajamas.            11.  Place clean sheets on your bed the night of your first shower and do not  sleep with pets. Day of Surgery : Do not apply any lotions/deodorants the morning of surgery.  Please wear clean clothes to the hospital/surgery center.  FAILURE TO FOLLOW THESE INSTRUCTIONS MAY RESULT IN THE CANCELLATION OF YOUR SURGERY How to Manage Your Diabetes Before and After Surgery  Why is it important to control my blood sugar before and after surgery? . Improving blood sugar levels before and after surgery helps healing and can limit problems. . A way of improving blood sugar control is eating a healthy diet by: o  Eating less sugar and carbohydrates o  Increasing activity/exercise o  Talking with your doctor about  reaching your blood sugar goals . High blood sugars (greater than 180 mg/dL) can raise your risk of infections and slow your recovery, so you will need to focus on controlling your diabetes during the weeks before surgery. . Make sure that the doctor who takes care of your diabetes knows about your planned surgery including the date and location.  How do I manage my blood sugar before surgery? . Check your blood sugar at least 4 times a day, starting 2 days before surgery, to make sure that the level is not too high or low. o Check your blood sugar the morning of your surgery when you wake up and every 2 hours until you get to the Short Stay unit. . If your blood sugar is less than 70 mg/dL, you will need to treat for low blood sugar: o Do not take insulin. o Treat a low blood sugar (less than 70 mg/dL) with  cup of clear juice (cranberry or apple), 4 glucose tablets, OR glucose gel. o Recheck blood sugar in 15 minutes after treatment (to make sure it is greater than 70 mg/dL). If your blood sugar is not greater than 70 mg/dL on recheck, call [phone] for further instructions. . Report your blood sugar to the short stay nurse when you get to Short Stay.  . If you are admitted to the hospital after surgery: o Your blood sugar will be checked by the staff and you will probably be given insulin after surgery (instead of oral diabetes medicines) to make  sure you have good blood sugar levels. o The goal for blood sugar control after surgery is 80-180 mg/dL.   WHAT DO I DO ABOUT MY DIABETES MEDICATION?  Marland Kitchen Do not take oral diabetes medicines (pills) the morning of surgery.   Patient Signature:  Date:   Nurse Signature:  Date:   Reviewed and Endorsed by [facility] Patient Education Committee, August 2015

## 2016-03-06 ENCOUNTER — Encounter (HOSPITAL_COMMUNITY)
Admission: RE | Admit: 2016-03-06 | Discharge: 2016-03-06 | Disposition: A | Discharge status: Home / Self Care | Payer: 59 | Source: Ambulatory Visit | Attending: General Surgery | Admitting: General Surgery

## 2016-03-06 ENCOUNTER — Encounter (HOSPITAL_COMMUNITY): Payer: Self-pay

## 2016-03-06 DIAGNOSIS — R600 Localized edema: Secondary | ICD-10-CM | POA: Insufficient documentation

## 2016-03-06 DIAGNOSIS — E119 Type 2 diabetes mellitus without complications: Secondary | ICD-10-CM | POA: Insufficient documentation

## 2016-03-06 DIAGNOSIS — G4733 Obstructive sleep apnea (adult) (pediatric): Secondary | ICD-10-CM | POA: Insufficient documentation

## 2016-03-06 DIAGNOSIS — Z01812 Encounter for preprocedural laboratory examination: Secondary | ICD-10-CM | POA: Diagnosis present

## 2016-03-06 DIAGNOSIS — J019 Acute sinusitis, unspecified: Secondary | ICD-10-CM | POA: Insufficient documentation

## 2016-03-06 DIAGNOSIS — R002 Palpitations: Secondary | ICD-10-CM | POA: Diagnosis not present

## 2016-03-06 DIAGNOSIS — I10 Essential (primary) hypertension: Secondary | ICD-10-CM | POA: Diagnosis not present

## 2016-03-06 HISTORY — DX: Sleep apnea, unspecified: G47.30

## 2016-03-06 HISTORY — DX: Personal history of urinary calculi: Z87.442

## 2016-03-06 HISTORY — DX: Type 2 diabetes mellitus without complications: E11.9

## 2016-03-06 HISTORY — DX: Unspecified osteoarthritis, unspecified site: M19.90

## 2016-03-06 HISTORY — DX: Cardiac murmur, unspecified: R01.1

## 2016-03-06 LAB — CBC WITH DIFFERENTIAL/PLATELET
BASOS PCT: 0 %
Basophils Absolute: 0 10*3/uL (ref 0.0–0.1)
Eosinophils Absolute: 0.1 10*3/uL (ref 0.0–0.7)
Eosinophils Relative: 2 %
HCT: 40.2 % (ref 36.0–46.0)
Hemoglobin: 13 g/dL (ref 12.0–15.0)
Lymphocytes Relative: 42 %
Lymphs Abs: 2 10*3/uL (ref 0.7–4.0)
MCH: 27.7 pg (ref 26.0–34.0)
MCHC: 32.3 g/dL (ref 30.0–36.0)
MCV: 85.7 fL (ref 78.0–100.0)
Monocytes Absolute: 0.3 10*3/uL (ref 0.1–1.0)
Monocytes Relative: 7 %
NEUTROS ABS: 2.3 10*3/uL (ref 1.7–7.7)
NEUTROS PCT: 49 %
PLATELETS: 261 10*3/uL (ref 150–400)
RBC: 4.69 MIL/uL (ref 3.87–5.11)
RDW: 14.6 % (ref 11.5–15.5)
WBC: 4.7 10*3/uL (ref 4.0–10.5)

## 2016-03-06 LAB — COMPREHENSIVE METABOLIC PANEL
ALT: 18 U/L (ref 14–54)
ANION GAP: 7 (ref 5–15)
AST: 20 U/L (ref 15–41)
Albumin: 4.4 g/dL (ref 3.5–5.0)
Alkaline Phosphatase: 70 U/L (ref 38–126)
BUN: 16 mg/dL (ref 6–20)
CHLORIDE: 104 mmol/L (ref 101–111)
CO2: 27 mmol/L (ref 22–32)
CREATININE: 0.66 mg/dL (ref 0.44–1.00)
Calcium: 9.3 mg/dL (ref 8.9–10.3)
Glucose, Bld: 102 mg/dL — ABNORMAL HIGH (ref 65–99)
POTASSIUM: 4.1 mmol/L (ref 3.5–5.1)
SODIUM: 138 mmol/L (ref 135–145)
Total Bilirubin: 0.5 mg/dL (ref 0.3–1.2)
Total Protein: 7.5 g/dL (ref 6.5–8.1)

## 2016-03-06 LAB — GLUCOSE, CAPILLARY: Glucose-Capillary: 95 mg/dL (ref 65–99)

## 2016-03-06 NOTE — Pre-Procedure Instructions (Signed)
EKG 7'17,Echo 7'17 Epic. CXR 2'17 Epic.

## 2016-03-07 LAB — HEMOGLOBIN A1C
HEMOGLOBIN A1C: 6 % — AB (ref 4.8–5.6)
MEAN PLASMA GLUCOSE: 126 mg/dL

## 2016-03-09 ENCOUNTER — Other Ambulatory Visit: Payer: Self-pay | Admitting: Orthopedic Surgery

## 2016-03-12 ENCOUNTER — Inpatient Hospital Stay (HOSPITAL_COMMUNITY): Payer: 59 | Admitting: Anesthesiology

## 2016-03-12 ENCOUNTER — Encounter (HOSPITAL_COMMUNITY): Payer: Self-pay | Admitting: *Deleted

## 2016-03-12 ENCOUNTER — Inpatient Hospital Stay (HOSPITAL_COMMUNITY)
Admission: RE | Admit: 2016-03-12 | Discharge: 2016-03-13 | DRG: 621 | Disposition: A | Discharge status: Home / Self Care | Payer: 59 | Source: Ambulatory Visit | Attending: General Surgery | Admitting: General Surgery

## 2016-03-12 ENCOUNTER — Encounter (HOSPITAL_COMMUNITY)
Admission: RE | Disposition: A | Discharge status: Home / Self Care | Payer: Self-pay | Source: Ambulatory Visit | Attending: General Surgery

## 2016-03-12 DIAGNOSIS — IMO0001 Reserved for inherently not codable concepts without codable children: Secondary | ICD-10-CM

## 2016-03-12 DIAGNOSIS — Z7951 Long term (current) use of inhaled steroids: Secondary | ICD-10-CM | POA: Diagnosis not present

## 2016-03-12 DIAGNOSIS — E119 Type 2 diabetes mellitus without complications: Secondary | ICD-10-CM | POA: Diagnosis present

## 2016-03-12 DIAGNOSIS — G4733 Obstructive sleep apnea (adult) (pediatric): Secondary | ICD-10-CM | POA: Diagnosis present

## 2016-03-12 DIAGNOSIS — E662 Morbid (severe) obesity with alveolar hypoventilation: Secondary | ICD-10-CM | POA: Diagnosis present

## 2016-03-12 DIAGNOSIS — Z6841 Body Mass Index (BMI) 40.0 and over, adult: Secondary | ICD-10-CM | POA: Diagnosis not present

## 2016-03-12 DIAGNOSIS — E669 Obesity, unspecified: Secondary | ICD-10-CM | POA: Diagnosis present

## 2016-03-12 HISTORY — PX: LAPAROSCOPIC GASTRIC SLEEVE RESECTION: SHX5895

## 2016-03-12 LAB — CBC
HCT: 39.2 % (ref 36.0–46.0)
HEMOGLOBIN: 13.1 g/dL (ref 12.0–15.0)
MCH: 27.9 pg (ref 26.0–34.0)
MCHC: 33.4 g/dL (ref 30.0–36.0)
MCV: 83.4 fL (ref 78.0–100.0)
PLATELETS: 238 10*3/uL (ref 150–400)
RBC: 4.7 MIL/uL (ref 3.87–5.11)
RDW: 14 % (ref 11.5–15.5)
WBC: 9.4 10*3/uL (ref 4.0–10.5)

## 2016-03-12 LAB — GLUCOSE, CAPILLARY
GLUCOSE-CAPILLARY: 105 mg/dL — AB (ref 65–99)
Glucose-Capillary: 101 mg/dL — ABNORMAL HIGH (ref 65–99)

## 2016-03-12 LAB — HEMOGLOBIN AND HEMATOCRIT, BLOOD
HEMATOCRIT: 39.3 % (ref 36.0–46.0)
HEMOGLOBIN: 13 g/dL (ref 12.0–15.0)

## 2016-03-12 LAB — CREATININE, SERUM: CREATININE: 0.81 mg/dL (ref 0.44–1.00)

## 2016-03-12 SURGERY — GASTRECTOMY, SLEEVE, LAPAROSCOPIC
Anesthesia: General

## 2016-03-12 MED ORDER — SUGAMMADEX SODIUM 500 MG/5ML IV SOLN
INTRAVENOUS | Status: DC | PRN
  Administered 2016-03-12: 250 mg via INTRAVENOUS

## 2016-03-12 MED ORDER — MIDAZOLAM HCL 2 MG/2ML IJ SOLN
INTRAMUSCULAR | Status: DC | PRN
  Administered 2016-03-12: 2 mg via INTRAVENOUS

## 2016-03-12 MED ORDER — PREMIER PROTEIN SHAKE: 2.0000 [oz_av] | ORAL | Status: DC

## 2016-03-12 MED ORDER — LACTATED RINGERS IV SOLN
INTRAVENOUS | Status: DC
  Administered 2016-03-12 (×4): via INTRAVENOUS

## 2016-03-12 MED ORDER — CHLORHEXIDINE GLUCONATE 4 % EX LIQD: 60.0000 mL | Freq: Once | CUTANEOUS | Status: DC

## 2016-03-12 MED ORDER — ONDANSETRON HCL 4 MG/2ML IJ SOLN
4.0000 mg | INTRAMUSCULAR | Status: DC | PRN
  Administered 2016-03-12 – 2016-03-13 (×3): 4 mg via INTRAVENOUS
  Filled 2016-03-12 (×3): qty 2

## 2016-03-12 MED ORDER — HYDROMORPHONE HCL 2 MG/ML IJ SOLN
0.2500 mg | INTRAMUSCULAR | Status: DC | PRN
  Administered 2016-03-12 (×3): 0.5 mg via INTRAVENOUS

## 2016-03-12 MED ORDER — BUPIVACAINE HCL 0.25 % IJ SOLN
INTRAMUSCULAR | Status: DC | PRN
  Administered 2016-03-12: 30 mL

## 2016-03-12 MED ORDER — MIDAZOLAM HCL 2 MG/2ML IJ SOLN
INTRAMUSCULAR | Status: AC
  Filled 2016-03-12: qty 2

## 2016-03-12 MED ORDER — ENOXAPARIN SODIUM 30 MG/0.3ML ~~LOC~~ SOLN
30.0000 mg | Freq: Two times a day (BID) | SUBCUTANEOUS | Status: DC
  Administered 2016-03-13: 30 mg via SUBCUTANEOUS
  Filled 2016-03-12: qty 0.3

## 2016-03-12 MED ORDER — LIDOCAINE 2% (20 MG/ML) 5 ML SYRINGE
INTRAMUSCULAR | Status: DC | PRN
  Administered 2016-03-12: 100 mg via INTRAVENOUS

## 2016-03-12 MED ORDER — APREPITANT 80 MG PO CAPS
80.0000 mg | ORAL_CAPSULE | ORAL | Status: AC
  Administered 2016-03-12: 80 mg via ORAL
  Filled 2016-03-12: qty 1

## 2016-03-12 MED ORDER — SUCCINYLCHOLINE CHLORIDE 200 MG/10ML IV SOSY
PREFILLED_SYRINGE | INTRAVENOUS | Status: DC | PRN
  Administered 2016-03-12: 160 mg via INTRAVENOUS

## 2016-03-12 MED ORDER — DEXAMETHASONE SODIUM PHOSPHATE 10 MG/ML IJ SOLN
INTRAMUSCULAR | Status: DC | PRN
  Administered 2016-03-12: 10 mg via INTRAVENOUS

## 2016-03-12 MED ORDER — ONDANSETRON HCL 4 MG/2ML IJ SOLN
INTRAMUSCULAR | Status: DC | PRN
  Administered 2016-03-12: 4 mg via INTRAVENOUS

## 2016-03-12 MED ORDER — HYDROMORPHONE HCL 2 MG/ML IJ SOLN
INTRAMUSCULAR | Status: AC
  Filled 2016-03-12: qty 1

## 2016-03-12 MED ORDER — FENTANYL CITRATE (PF) 250 MCG/5ML IJ SOLN
INTRAMUSCULAR | Status: DC | PRN
  Administered 2016-03-12 (×2): 100 ug via INTRAVENOUS
  Administered 2016-03-12 (×3): 50 ug via INTRAVENOUS

## 2016-03-12 MED ORDER — KETOROLAC TROMETHAMINE 30 MG/ML IJ SOLN: 30.0000 mg | Freq: Once | INTRAMUSCULAR | Status: DC | PRN

## 2016-03-12 MED ORDER — FLUTICASONE PROPIONATE 50 MCG/ACT NA SUSP
1.0000 | Freq: Every day | NASAL | Status: DC
  Filled 2016-03-12: qty 16

## 2016-03-12 MED ORDER — CEFOTETAN DISODIUM-DEXTROSE 2-2.08 GM-% IV SOLR
2.0000 g | INTRAVENOUS | Status: AC
  Administered 2016-03-12: 2 g via INTRAVENOUS

## 2016-03-12 MED ORDER — SODIUM CHLORIDE 0.9 % IV SOLN
INTRAVENOUS | Status: DC
  Administered 2016-03-12 – 2016-03-13 (×2): via INTRAVENOUS

## 2016-03-12 MED ORDER — LACTATED RINGERS IR SOLN
Status: DC | PRN
  Administered 2016-03-12: 1

## 2016-03-12 MED ORDER — PHENYLEPHRINE 40 MCG/ML (10ML) SYRINGE FOR IV PUSH (FOR BLOOD PRESSURE SUPPORT)
PREFILLED_SYRINGE | INTRAVENOUS | Status: DC | PRN
  Administered 2016-03-12: 80 ug via INTRAVENOUS
  Administered 2016-03-12: 120 ug via INTRAVENOUS
  Administered 2016-03-12: 80 ug via INTRAVENOUS
  Administered 2016-03-12: 40 ug via INTRAVENOUS
  Administered 2016-03-12: 80 ug via INTRAVENOUS

## 2016-03-12 MED ORDER — ACETAMINOPHEN 160 MG/5ML PO SOLN
650.0000 mg | ORAL | Status: DC | PRN
  Administered 2016-03-13: 650 mg via ORAL
  Filled 2016-03-12: qty 20.3

## 2016-03-12 MED ORDER — BUPIVACAINE HCL (PF) 0.25 % IJ SOLN
INTRAMUSCULAR | Status: AC
  Filled 2016-03-12: qty 30

## 2016-03-12 MED ORDER — MORPHINE SULFATE (PF) 2 MG/ML IV SOLN
1.0000 mg | INTRAVENOUS | Status: DC | PRN
  Administered 2016-03-12: 2 mg via INTRAVENOUS
  Filled 2016-03-12: qty 1

## 2016-03-12 MED ORDER — ACETAMINOPHEN 160 MG/5ML PO SOLN: 325.0000 mg | ORAL | Status: DC | PRN

## 2016-03-12 MED ORDER — PROPOFOL 10 MG/ML IV BOLUS
INTRAVENOUS | Status: DC | PRN
  Administered 2016-03-12: 200 mg via INTRAVENOUS

## 2016-03-12 MED ORDER — PROMETHAZINE HCL 25 MG/ML IJ SOLN: 6.2500 mg | INTRAMUSCULAR | Status: DC | PRN

## 2016-03-12 MED ORDER — FENTANYL CITRATE (PF) 250 MCG/5ML IJ SOLN
INTRAMUSCULAR | Status: AC
  Filled 2016-03-12: qty 5

## 2016-03-12 MED ORDER — BUPIVACAINE LIPOSOME 1.3 % IJ SUSP
20.0000 mL | Freq: Once | INTRAMUSCULAR | Status: AC
  Administered 2016-03-12: 20 mL
  Filled 2016-03-12: qty 20

## 2016-03-12 MED ORDER — HEPARIN SODIUM (PORCINE) 5000 UNIT/ML IJ SOLN
5000.0000 [IU] | INTRAMUSCULAR | Status: AC
  Administered 2016-03-12: 5000 [IU] via SUBCUTANEOUS
  Filled 2016-03-12: qty 1

## 2016-03-12 MED ORDER — PANTOPRAZOLE SODIUM 40 MG IV SOLR
40.0000 mg | Freq: Every day | INTRAVENOUS | Status: DC
  Administered 2016-03-12: 40 mg via INTRAVENOUS
  Filled 2016-03-12: qty 40

## 2016-03-12 MED ORDER — CEFOTETAN DISODIUM-DEXTROSE 2-2.08 GM-% IV SOLR
INTRAVENOUS | Status: AC
  Filled 2016-03-12: qty 50

## 2016-03-12 MED ORDER — FENTANYL CITRATE (PF) 100 MCG/2ML IJ SOLN
INTRAMUSCULAR | Status: AC
  Filled 2016-03-12: qty 2

## 2016-03-12 MED ORDER — ROCURONIUM BROMIDE 10 MG/ML (PF) SYRINGE
PREFILLED_SYRINGE | INTRAVENOUS | Status: DC | PRN
  Administered 2016-03-12: 5 mg via INTRAVENOUS
  Administered 2016-03-12: 30 mg via INTRAVENOUS
  Administered 2016-03-12: 10 mg via INTRAVENOUS

## 2016-03-12 MED ORDER — PROPOFOL 10 MG/ML IV BOLUS
INTRAVENOUS | Status: AC
  Filled 2016-03-12: qty 20

## 2016-03-12 MED ORDER — OXYCODONE HCL 5 MG/5ML PO SOLN
5.0000 mg | ORAL | Status: DC | PRN
  Administered 2016-03-13: 5 mg via ORAL
  Filled 2016-03-12 (×2): qty 5

## 2016-03-12 SURGICAL SUPPLY — 57 items
APPLIER CLIP 5 13 M/L LIGAMAX5 (MISCELLANEOUS)
APPLIER CLIP ROT 10 11.4 M/L (STAPLE)
APPLIER CLIP ROT 13.4 12 LRG (CLIP)
BLADE SURG SZ11 CARB STEEL (BLADE) ×2 IMPLANT
CABLE HIGH FREQUENCY MONO STRZ (ELECTRODE) ×2 IMPLANT
CHLORAPREP W/TINT 26ML (MISCELLANEOUS) ×2 IMPLANT
CLIP APPLIE 5 13 M/L LIGAMAX5 (MISCELLANEOUS) IMPLANT
CLIP APPLIE ROT 10 11.4 M/L (STAPLE) IMPLANT
CLIP APPLIE ROT 13.4 12 LRG (CLIP) IMPLANT
COVER SURGICAL LIGHT HANDLE (MISCELLANEOUS) ×2 IMPLANT
DERMABOND ADVANCED (GAUZE/BANDAGES/DRESSINGS) ×1
DERMABOND ADVANCED .7 DNX12 (GAUZE/BANDAGES/DRESSINGS) ×1 IMPLANT
DRAIN CHANNEL 19F RND (DRAIN) IMPLANT
ELECT REM PT RETURN 9FT ADLT (ELECTROSURGICAL) ×2
ELECTRODE REM PT RTRN 9FT ADLT (ELECTROSURGICAL) ×1 IMPLANT
EVACUATOR SILICONE 100CC (DRAIN) IMPLANT
GAUZE SPONGE 4X4 12PLY STRL (GAUZE/BANDAGES/DRESSINGS) IMPLANT
GLOVE BIOGEL PI IND STRL 7.0 (GLOVE) ×1 IMPLANT
GLOVE BIOGEL PI INDICATOR 7.0 (GLOVE) ×1
GLOVE SURG SS PI 7.0 STRL IVOR (GLOVE) ×2 IMPLANT
GOWN STRL REUS W/TWL LRG LVL3 (GOWN DISPOSABLE) ×2 IMPLANT
GOWN STRL REUS W/TWL XL LVL3 (GOWN DISPOSABLE) ×6 IMPLANT
GRASPER SUT TROCAR 14GX15 (MISCELLANEOUS) ×2 IMPLANT
HANDLE STAPLE EGIA 4 XL (STAPLE) ×2 IMPLANT
HOVERMATT SINGLE USE (MISCELLANEOUS) ×2 IMPLANT
IRRIG SUCT STRYKERFLOW 2 WTIP (MISCELLANEOUS) ×2
IRRIGATION SUCT STRKRFLW 2 WTP (MISCELLANEOUS) ×1 IMPLANT
KIT BASIN OR (CUSTOM PROCEDURE TRAY) ×2 IMPLANT
MARKER SKIN DUAL TIP RULER LAB (MISCELLANEOUS) ×2 IMPLANT
NEEDLE SPNL 22GX3.5 QUINCKE BK (NEEDLE) ×2 IMPLANT
PACK CARDIOVASCULAR III (CUSTOM PROCEDURE TRAY) ×2 IMPLANT
POUCH SPECIMEN RETRIEVAL 10MM (ENDOMECHANICALS) ×2 IMPLANT
RELOAD EGIA 45 MED/THCK PURPLE (STAPLE) ×2 IMPLANT
RELOAD TRI 45 ART MED THCK BLK (STAPLE) ×4 IMPLANT
RELOAD TRI 45 ART MED THCK PUR (STAPLE) IMPLANT
RELOAD TRI 60 ART MED THCK BLK (STAPLE) ×4 IMPLANT
RELOAD TRI 60 ART MED THCK PUR (STAPLE) ×4 IMPLANT
SCISSORS LAP 5X45 EPIX DISP (ENDOMECHANICALS) ×2 IMPLANT
SHEARS HARMONIC ACE PLUS 45CM (MISCELLANEOUS) ×2 IMPLANT
SLEEVE GASTRECTOMY 40FR VISIGI (MISCELLANEOUS) ×2 IMPLANT
SLEEVE XCEL OPT CAN 5 100 (ENDOMECHANICALS) ×4 IMPLANT
SOLUTION ANTI FOG 6CC (MISCELLANEOUS) ×2 IMPLANT
SPONGE LAP 18X18 X RAY DECT (DISPOSABLE) ×2 IMPLANT
SUT ETHIBOND 0 36 GRN (SUTURE) IMPLANT
SUT ETHILON 2 0 PS N (SUTURE) IMPLANT
SUT MNCRL AB 4-0 PS2 18 (SUTURE) ×2 IMPLANT
SUT SILK 0 SH 30 (SUTURE) IMPLANT
SUT VICRYL 0 TIES 12 18 (SUTURE) ×2 IMPLANT
SYR 20CC LL (SYRINGE) ×2 IMPLANT
SYR 50ML LL SCALE MARK (SYRINGE) ×2 IMPLANT
TOWEL OR 17X26 10 PK STRL BLUE (TOWEL DISPOSABLE) ×2 IMPLANT
TOWEL OR NON WOVEN STRL DISP B (DISPOSABLE) ×2 IMPLANT
TROCAR BLADELESS 15MM (ENDOMECHANICALS) ×2 IMPLANT
TROCAR BLADELESS OPT 5 100 (ENDOMECHANICALS) ×2 IMPLANT
TUBING CONNECTING 10 (TUBING) ×2 IMPLANT
TUBING ENDO SMARTCAP (MISCELLANEOUS) ×2 IMPLANT
TUBING INSUF HEATED (TUBING) ×2 IMPLANT

## 2016-03-12 NOTE — Op Note (Signed)
Preop Diagnosis: Obesity Class III  Postop Diagnosis: same  Procedure performed: laparoscopic Sleeve Gastrectomy  Assitant: Alphonsa Overall  Indications:  The patient is a 50 y.o. year-old morbidly obese female who has been followed in the Bariatric Clinic as an outpatient. This patient was diagnosed with morbid obesity with a BMI of Body mass index is 49.47 kg/m. and significant co-morbidities including hypertension.  The patient was counseled extensively in the Bariatric Outpatient Clinic and after a thorough explanation of the risks and benefits of surgery (including death from complications, bowel leak, infection such as peritonitis and/or sepsis, internal hernia, bleeding, need for blood transfusion, bowel obstruction, organ failure, pulmonary embolus, deep venous thrombosis, wound infection, incisional hernia, skin breakdown, and others entailed on the consent form) and after a compliant diet and exercise program, the patient was scheduled for an elective laparoscopic sleeve gastrectomy.  Description of Operation:  Following informed consent, the patient was taken to the operating room and placed on the operating table in the supine position.  She had previously received prophylactic antibiotics and subcutaneous heparin for DVT prophylaxis in the pre-op holding area.  After induction of general endotracheal anesthesia by the anesthesiologist, the patient underwent placement of sequential compression devices, Foley catheter and an oro-gastric tube.  A timeout was confirmed by the surgery and anesthesia teams.  The patient was adequately padded at all pressure points and placed on a footboard to prevent slippage from the OR table during extremes of position during surgery.  She underwent a routine sterile prep and drape of her entire abdomen.    Next, A transverse incision was made under the left subcostal area and a 1mm optical viewing trocar was introduced into the peritoneal cavity.  Pneumoperitoneum was applied with a high flow and low pressure. A laparoscope was inserted to confirm placement. A extraperitoneal block was then placed at the lateral abdominal wall using exparel diluted with marcaine. 5 additional trocars were placed: 1 9mm trocar to the left of the midline. 1 additional 28mm trocar in the left lateral area, 1 48mm trocar in the right mid abdomen, and 1 50mm trocar in the right subcostal area.  The fat pad at the GE junction was incised and the gastrodiaphragmatic ligament was divided using the Harmonic scalpel. Next, a hole was created through the lesser omentum along the greater curve of the stomach to enter the lesser sac. The vessels along the greater omentum were  Then ligated and divided using the Harmonic scalpel moving towards the spleen and then short gastric vessels were ligated and divided in the same fashion to fully mobilize the fundus. The left crus was identified to ensure completion of the dissection. Next the antrum was measured and dissection continued inferiorly along the greater curve towards the pylorus and stopped 6cm from the pylorus.   A 40Fr ViSiGi dilator was placed into the esophgaus and along the lesser curve of the stomach and placed on suction. A 49mm 4-63mm tristapler was used to begin the resection along the antrum being sure to stay well away from the angularis by angling the jaws of the stapler towards the greater curve. An additional 74mm 4-87mm tristapler was used to continue the dissection. Then multiple 60mm 3-66mm tristapler loads were used to complete the resection staying along the [place] and ensuring the fundus was not retained by appropriately retracting it lateral. Air was inserted through the [place] to perform a leak test showing no bubbles and a neutral lie of the stomach.  The assistant then went  and performed an upper endoscopy and leak test. No bubbles were seen and the sleeve and antrum distended appropriately. The specimen was  then placed in an endocatch bag and removed by the 34mm port. The fascia of the 38mm port was closed with a 0 vicryl by suture passer. Hemostasis was ensured. Pneumoperitoneum was evacuated, all ports were removed and all incisions closed with 4-0 monocryl suture in subcuticular fashion. Glue was put in place for dressing. The patient awoke from anesthesia and was brought to pacu in stable condition. All counts were correct.  Estimated blood loss: <37ml  Specimens:  Sleeve gastrectomy  Post-Op Plan:       Pain Management: PO, prn      Antibiotics: Prophylactic      Anticoagulation: Prophylactic, Starting now      Post Op Studies/Consults: Not applicable      Intended Discharge: within 48h      Intended Outpatient Follow-Up: Two Week      Intended Outpatient Studies: Not Applicable      Other: Not Applicable   Arta Bruce Kinsinger

## 2016-03-12 NOTE — Anesthesia Postprocedure Evaluation (Signed)
Anesthesia Post Note  Patient: Patricia [name]  Procedure(s) Performed: Procedure(s) (LRB): LAPAROSCOPIC GASTRIC SLEEVE RESECTION WITH UPPER ENDO (N/A)  Patient location during evaluation: PACU Anesthesia Type: General Level of consciousness: awake and alert Pain management: pain level controlled Vital Signs Assessment: post-procedure vital signs reviewed and stable Respiratory status: spontaneous breathing, nonlabored ventilation, respiratory function stable and patient connected to nasal cannula oxygen Cardiovascular status: blood pressure returned to baseline and stable Postop Assessment: no signs of nausea or vomiting Anesthetic complications: no    Last Vitals:  Vitals:   03/12/16 1130 03/12/16 1201  BP: 110/78 (!) 148/74  Pulse: 68 72  Resp: 18 (!) 21  Temp:      Last Pain:  Vitals:   03/12/16 1201  TempSrc:   PainSc: 6                  Elyse Prevo S

## 2016-03-12 NOTE — Discharge Instructions (Addendum)
Aprepitant Discharge Instructions  °On the day of surgery you were given the medication aprepitant. This medication interacts with hormonal forms of birth control (oral contraceptives and injected or implanted birth control) and may make them ineffective. °IF YOU USE ANY HORMONAL FORM OF BIRTH CONTROL, YOU MUST USE AN ADDITIONAL BARRIER BIRTH CONTROL METHOD FOR ONE MONTH after receiving aprepitant or there is a chance you could become pregnant. ° ° ° ° °GASTRIC BYPASS/SLEEVE ° Home Care Instructions ° ° These instructions are to help you care for yourself when you go home. ° °Call: If you have any problems. °• Call [phone] and ask for the surgeon on call °• If you need immediate assistance come to the ER at [facility]. Tell the ER staff you are a new post-op gastric bypass or gastric sleeve patient  °Signs and symptoms to report: • Severe  vomiting or nausea °o If you cannot handle clear liquids for longer than 1 day, call your surgeon °• Abdominal pain which does not get better after taking your pain medication °• Fever greater than 100.4°  F and chills °• Heart rate over 100 beats a minute °• Trouble breathing °• Chest pain °• Redness,  swelling, drainage, or foul odor at incision (surgical) sites °• If your incisions open or pull apart °• Swelling or pain in calf (lower leg) °• Diarrhea (Loose bowel movements that happen often), frequent watery, uncontrolled bowel movements °• Constipation, (no bowel movements for 3 days) if this happens: °o Take Milk of Magnesia, 2 tablespoons by mouth, 3 times a day for 2 days if needed °o Stop taking Milk of Magnesia once you have had a bowel movement °o Call your doctor if constipation continues °Or °o Take Miralax  (instead of Milk of Magnesia) following the label instructions °o Stop taking Miralax once you have had a bowel movement °o Call your doctor if constipation continues °• Anything you think is “abnormal for you” °  °Normal side effects after surgery: • Unable  to sleep at night or unable to concentrate °• Irritability °• Being tearful (crying) or depressed ° °These are common complaints, possibly related to your anesthesia, stress of surgery, and change in lifestyle, that usually go away a few weeks after surgery. If these feelings continue, call your medical doctor.  °Wound Care: You may have surgical glue, steri-strips, or staples over your incisions after surgery °• Surgical glue: Looks like clear film over your incisions and will wear off a little at a time °• Steri-strips: Adhesive strips of tape over your incisions. You may notice a yellowish color on skin under the steri-strips. This is used to make the steri-strips stick better. Do not pull the steri-strips off - let them fall off °• Staples: Staples may be removed before you leave the hospital °o If you go home with staples, call [facility] for an appointment with your surgeon’s nurse to have staples removed 10 days after surgery, [phone] °• Showering: You may shower two (2) days after your surgery unless your surgeon tells you differently °o Wash gently around incisions with warm soapy water, rinse well, and gently pat dry °o If you have a drain (tube from your incision), you may need someone to hold this while you shower °o No tub baths until staples are removed and incisions are healed °  °Medications: • Medications should be liquid or crushed if larger than the size of a dime °• Extended release pills (medication that releases a little bit at   a time through the  day) should not be crushed °• Depending on the size and number of medications you take, you may need to space (take a few throughout the day)/change the time you take your medications so that you do not over-fill your pouch (smaller stomach) °• Make sure you follow-up with you primary care physician to make medication changes needed during rapid weight loss and life -style changes °• If you have diabetes, follow up with your  doctor that orders your diabetes medication(s) within one week after surgery and check your blood sugar regularly ° °• Do not drive while taking narcotics (pain medications) ° °• Do not take acetaminophen (Tylenol) and Roxicet or Lortab Elixir at the same time since these pain medications contain acetaminophen °  °Diet:  °First 2 Weeks You will see the nutritionist about two (2) weeks after your surgery. The nutritionist will increase the types of foods you can eat if you are handling liquids well: °• If you have severe vomiting or nausea and cannot handle clear liquids lasting longer than 1 day call your surgeon °Protein Shake °• Drink at least 2 ounces of shake 5-6 times per day °• Each serving of protein shakes (usually 8-12 ounces) should have a minimum of: °o 15 grams of protein °o And no more than 5 grams of carbohydrate °• Goal for protein each day: °o Men = 80 grams per day °o Women = 60 grams per day °  ° • Protein powder may be added to fluids such as non-fat milk or Lactaid milk or Soy milk (limit to 35 grams added protein powder per serving) ° °Hydration °• Slowly increase the amount of water and other clear liquids as tolerated (See Acceptable Fluids) °• Slowly increase the amount of protein shake as tolerated °• Sip fluids slowly and throughout the day °• May use sugar substitutes in small amounts (no more than 6-8 packets per day; i.e. Splenda) ° °Fluid Goal °• The first goal is to drink at least 8 ounces of protein shake/drink per day (or as directed by the nutritionist); some examples of protein shakes are Syntrax Nectar, Adkins Advantage, EAS Edge HP, and Unjury. - See handout from pre-op Bariatric Education Class: °o Slowly increase the amount of protein shake you drink as tolerated °o You may find it easier to slowly sip shakes throughout the day °o It is important to get your proteins in first °• Your fluid goal is to drink 64-100 ounces of fluid daily °o It may take a few weeks to build up to  this  °• 32 oz. (or more) should be clear liquids °And °• 32 oz. (or more) should be full liquids (see below for examples) °• Liquids should not contain sugar, caffeine, or carbonation ° °Clear Liquids: °• Water of Sugar-free flavored water (i.e. Fruit H²O, Propel) °• Decaffeinated coffee or tea (sugar-free) °• Crystal lite, Wyler’s Lite, Minute Maid Lite °• Sugar-free Jell-O °• Bouillon or broth °• Sugar-free Popsicle:    - Less than 20 calories each; Limit 1 per day ° °Full Liquids: °                  Protein Shakes/Drinks + 2 choices per day of other full liquids °• Full liquids must be: °o No More Than 12 grams of Carbs per serving °o No More Than 3 grams of Fat per serving °• Strained low-fat cream soup °• Non-Fat milk °• Fat-free Lactaid Milk °• Sugar-free yogurt (Dannon Lite & Fit, Greek   yogurt) ° °  °Vitamins and Minerals • Start 1 day after surgery unless otherwise directed by your surgeon °• 2 Chewable Multivitamin / Multimineral Supplement with iron (i.e. Centrum for Adults) °• Vitamin B-12, 350-500 micrograms sub-lingual (place tablet under the tongue) each day °• Chewable Calcium Citrate with Vitamin D-3 °(Example: 3 Chewable Calcium  Plus 600 with Vitamin D-3) °o Take 500 mg three (3) times a day for a total of 1500 mg each day °o Do not take all 3 doses of calcium at one time as it may cause constipation, and you can only absorb 500 mg at a time °o Do not mix multivitamins containing iron with calcium supplements;  take 2 hours apart °o Do not substitute Tums (calcium carbonate) for your calcium °• Menstruating women and those at risk for anemia ( a blood disease that causes weakness) may need extra iron °o Talk to your doctor to see if you need more iron °• If you need extra iron: Total daily Iron recommendation (including Vitamins) is 50 to 100 mg Iron/day °• Do not stop taking or change any vitamins or minerals until you talk to your nutritionist or surgeon °• Your nutritionist and/or surgeon must  approve all vitamin and mineral supplements °  °Activity and Exercise: It is important to continue walking at home. Limit your physical activity as instructed by your doctor. During this time, use these guidelines: °• Do not lift anything greater than ten  (10) pounds for at least two (2) weeks °• Do not go back to work or drive until your surgeon says you can °• You may have sex when you feel comfortable °o It is VERY important for female patients to use a reliable birth control method; fertility often increase after surgery °o Do not get pregnant for at least 18 months °• Start exercising as soon as your doctor tells you that you can °o Make sure your doctor approves any physical activity °• Start with a simple walking program °• Walk 5-15 minutes each day, 7 days per week °• Slowly increase until you are walking 30-45 minutes per day °• Consider joining our BELT program. [phone] or email [email or web address] °  °Special Instructions Things to remember: °• Free counseling is available for you and your family through collaboration between [facility] and [employer]. Please call [phone] and leave a message °• Use your CPAP when sleeping if this applies to you °• Consider buying a medical alert bracelet that says you had lap-band surgery °  °  You will likely have your first fill (fluid added to your band) 6 - 8 weeks after surgery °• [facility] has a free Bariatric Surgery Support Group that meets monthly, the 3rd Thursday, 6pm.  Education Center Classrooms. You can see classes online at [email or web address] °• It is very important to keep all follow up appointments with your surgeon, nutritionist, primary care physician, and behavioral health practitioner °o After the first year, please follow up with your bariatric surgeon and nutritionist at least once a year in order to maintain best weight loss results °      °             [facility]:  [phone] ° °             [facility]: [phone] ° °             Bariatric Nurse Coordinator: [phone]  °Gastric Bypass/Sleeve Home Care   Instructions  Rev. 04/2012    ° °                                                    Reviewed and Endorsed °                                                   by [facility] Patient Education Committee, Jan, 2014 ° ° ° ° ° ° ° ° ° °

## 2016-03-12 NOTE — Transfer of Care (Signed)
Immediate Anesthesia Transfer of Care Note  Patient: Patricia [name]  Procedure(s) Performed: Procedure(s): LAPAROSCOPIC GASTRIC SLEEVE RESECTION WITH UPPER ENDO (N/A)  Patient Location: PACU  Anesthesia Type:General  Level of Consciousness: awake and alert   Airway & Oxygen Therapy: Patient Spontanous Breathing and Patient connected to face mask oxygen  Post-op Assessment: Report given to RN and Post -op Vital signs reviewed and stable  Post vital signs: Reviewed and stable  Last Vitals:  Vitals:   03/12/16 0734 03/12/16 1121  BP: 127/84   Pulse: 72   Resp: 18 11  Temp: 37.2 C 36.5 C    Last Pain:  Vitals:   03/12/16 1121  TempSrc:   PainSc: 7       Patients Stated Pain Goal: 4 ([id number])  Complications: No apparent anesthesia complications

## 2016-03-12 NOTE — Interval H&P Note (Signed)
History and Physical Interval Note:  03/12/2016 8:57 AM  Patricia [name]  has presented today for surgery, with the diagnosis of MORBID OBESITY  The various methods of treatment have been discussed with the patient and family. After consideration of risks, benefits and other options for treatment, the patient has consented to  Procedure(s): LAPAROSCOPIC GASTRIC SLEEVE RESECTION WITH UPPER ENDO (N/A) as a surgical intervention .  The patient's history has been reviewed, patient examined, no change in status, stable for surgery.  I have reviewed the patient's chart and labs.  Questions were answered to the patient's satisfaction.     Arta Bruce Wilbert Schouten

## 2016-03-12 NOTE — H&P (View-Only) (Signed)
History of Present Illness Geoffery Spruce MD; 03/02/2016 12:13 PM) The patient is a 50 year old female who presents for a bariatric surgery evaluation. Note for "Bariatric surgery evaluation": Patient is completed all necessary workup in the preoperative phase for bariatric surgery. The patient is now ready to proceed with sleeve gastrectomy. The patient has made moderate improvements in her diet and is exercising well and has no new medical issues or new medications.  She notes taking the large potassium pill and will go to pharmacy to look at other options there.   Allergies (Sonya Bynum, CMA; 03/02/2016 11:45 AM) No Known Drug Allergies01/20/2017  Medication History (Sonya Bynum, CMA; 03/02/2016 11:45 AM) Omeprazole (40MG  Capsule DR, Oral) Active. Hyzaar (50-12.5MG  Tablet, Oral) Active. Potassium (75MG  Tablet, Oral) Active. Probiotic Product (Oral) Active. Medications Reconciled  Vitals (Sonya Bynum CMA; 03/02/2016 11:45 AM) 03/02/2016 11:44 AM Weight: 274 lb Height: 61in Body Surface Area: 2.16 m Body Mass Index: 51.77 kg/m  Temp.: 97.61F(Temporal)  Pulse: 75 (Regular)  BP: 122/74 (Sitting, Left Arm, Standard)       Physical Exam Geoffery Spruce, MD; 03/02/2016 12:13 PM) General Mental Status-Alert. General Appearance-Cooperative. Orientation-Oriented X4. Build & Nutrition-Obese. Posture-Normal posture.  Integumentary Global Assessment Normal Exam - Head/Face: no rashes, ulcers, lesions or evidence of photo damage. No palpable nodules or masses and Neck: no visible lesions or palpable masses.  Head and Neck Head-normocephalic, atraumatic with no lesions or palpable masses. Face Global Assessment - atraumatic. Thyroid Gland Characteristics - normal size and consistency.  Eye Eyeball - Bilateral-Extraocular movements intact. Sclera/Conjunctiva - Bilateral-No scleral icterus, No Discharge.  ENMT Nose and Sinuses Nose - no  deformities observed, no swelling present.  Chest and Lung Exam Palpation Normal exam - Non-tender. Auscultation Breath sounds - Normal.  Cardiovascular Auscultation Rhythm - Regular. Heart Sounds - S1 WNL and S2 WNL. Carotid arteries - No Carotid bruit.  Abdomen Inspection Normal Exam - No Visible peristalsis, No Abnormal pulsations and No Paradoxical movements. Palpation/Percussion Normal exam - Soft, Non Tender, No Rebound tenderness, No Rigidity (guarding), No hepatosplenomegaly and No Palpable abdominal masses.  Peripheral Vascular Upper Extremity Palpation - Pulses bilaterally normal. Lower Extremity Palpation - Edema - Bilateral - No edema.  Neurologic Neurologic evaluation reveals -normal sensation and normal coordination.  Neuropsychiatric Mental status exam performed with findings of-able to articulate well with normal speech/language, rate, volume and coherence and thought content normal with ability to perform basic computations and apply abstract reasoning.  Musculoskeletal Normal Exam - Bilateral-Upper Extremity Strength Normal and Lower Extremity Strength Normal.    Assessment & Plan Geoffery Spruce MD; 03/02/2016 12:14 PM) MORBID OBESITY (E66.01) Story: 50 yo female with morbid obesity, IBS, reflux, and OSA.  The patient meets weight loss surgery criteria. Due to the above reasons, I think laparoscopic vertical sleeve gastrectomy is the best option for the patient.  BMI 51.77 Impression: We again discussed the details of the operation: Will be done under general anesthesia with an endotracheal tube, that it will be a laparoscopic procedure with 6 small incisions large one being 1.5-2in, that we will remove remove the short gastrics and other vessels to the greater curve of the stomach, place a large Bougie down the stomach and then remove a percent of the stomach using a linear stapler. We discussed the procedure will take approximately 1.5-2 hours.  We discussed risks of VTE, staple line leak, skin infection, sleeve stenosis, incisional hernia, need for open procedure, and postoperative nausea and vomiting. Current Plans Started  OxyCODONE HCl 5MG /5ML, 5-10 Milliliter every four hours, as needed, 100 Milliliter, 03/02/2016, No Refill. Started Zofran ODT 4MG , 1 (one) Tablet Disperse every six hours, as needed, #15, 03/02/2016, No Refill.

## 2016-03-12 NOTE — Anesthesia Preprocedure Evaluation (Addendum)
Anesthesia Evaluation  Patient identified by MRN, date of birth, ID band Patient awake    Reviewed: Allergy & Precautions, NPO status , Patient's Chart, lab work & pertinent test results  Airway Mallampati: III  TM Distance: <3 FB Neck ROM: Full    Dental no notable dental hx. (+) Dental Advisory Given, Caps   Pulmonary sleep apnea and Continuous Positive Airway Pressure Ventilation ,    Pulmonary exam normal breath sounds clear to auscultation       Cardiovascular hypertension, Normal cardiovascular exam Rhythm:Regular Rate:Normal     Neuro/Psych negative neurological ROS  negative psych ROS   GI/Hepatic negative GI ROS, Neg liver ROS,   Endo/Other  diabetesMorbid obesity  Renal/GU negative Renal ROS  negative genitourinary   Musculoskeletal negative musculoskeletal ROS (+)   Abdominal   Peds negative pediatric ROS (+)  Hematology negative hematology ROS (+)   Anesthesia Other Findings   Reproductive/Obstetrics negative OB ROS                            Anesthesia Physical Anesthesia Plan  ASA: III  Anesthesia Plan: General   Post-op Pain Management:    Induction: Intravenous  Airway Management Planned: Oral ETT  Additional Equipment:   Intra-op Plan:   Post-operative Plan: Extubation in OR  Informed Consent: I have reviewed the patients History and Physical, chart, labs and discussed the procedure including the risks, benefits and alternatives for the proposed anesthesia with the patient or authorized representative who has indicated his/her understanding and acceptance.   Dental advisory given  Plan Discussed with: CRNA and Surgeon  Anesthesia Plan Comments:         Anesthesia Quick Evaluation

## 2016-03-12 NOTE — Op Note (Signed)
Name:  [name] MRN: [id number] Date of Surgery: 03/12/2016  Preop Diagnosis:  Morbid Obesity  Postop Diagnosis:  Morbid Obesity (Weight - 271, BMI - 51.7), S/P Gastric Sleeve  Procedure:  Upper endoscopy  (Intraoperative)  Surgeon:  Alphonsa Overall, M.D.  Anesthesia:  GET  Indications for procedure: Patricia [name] is a 50 y.o. female whose primary care physician is [facility] TOM, MD and has completed a Gastric Sleeve today by Dr. Kieth Brightly.  I am doing an intraoperative upper endoscopy to evaluate the gastric pouch.  Operative Note: The patient is under general anesthesia.  Dr. Kieth Brightly is laparoscoping the patient while I do an upper endoscopy to evaluate the stomach pouch.  With the patient intubated, I passed the Pentax upper endoscope without difficulty down the esophagus.  The esophagus was unremarkable.  The esophago-gastric junction was at 39 cm.    The mucosa of the stomach looked viable and the staple line was intact without bleeding.  I advanced the scope to the pylorus, but did not go through it.  While I insufflated the stomach pouch with air, Dr. Kieth Brightly  flooded the upper abdomen with saline to put the gastric pouch under saline.  There was no bubbling or evidence of a leak.  There was no evidence of narrowing of the pouch and the gastric sleeve looked tubular.  The scope was then withdrawn.  The esophagus was unremarkable and the patient tolerated the endoscopy without difficulty.  Alphonsa Overall, MD, [facility] Pager: [phone] Office phone:  [phone]

## 2016-03-12 NOTE — Anesthesia Procedure Notes (Signed)
Procedure Name: Intubation Date/Time: 03/12/2016 9:41 AM Performed by: Cynda Familia Pre-anesthesia Checklist: Patient identified, Emergency Drugs available, Suction available and Patient being monitored Patient Re-evaluated:Patient Re-evaluated prior to inductionOxygen Delivery Method: Circle System Utilized Preoxygenation: Pre-oxygenation with 100% oxygen Intubation Type: IV induction, Rapid sequence and Cricoid Pressure applied Ventilation: Mask ventilation without difficulty Grade View: Grade I Tube type: Oral Number of attempts: 1 Airway Equipment and Method: Stylet and Video-laryngoscopy (oral aw for mask ventilation and removed) Placement Confirmation: ETT inserted through vocal cords under direct vision,  positive ETCO2 and breath sounds checked- equal and bilateral Secured at: 22 cm Tube secured with: Tape Dental Injury: Teeth and Oropharynx as per pre-operative assessment  Comments: Smooth IV induction Rose--- intubation AM CRNA--- glidescope--- teeth and mouth as preop-- small nick lower left lip--- bilat BS Rose

## 2016-03-13 LAB — CBC WITH DIFFERENTIAL/PLATELET
BASOS PCT: 0 %
Basophils Absolute: 0 10*3/uL (ref 0.0–0.1)
EOS ABS: 0 10*3/uL (ref 0.0–0.7)
EOS PCT: 0 %
HCT: 38.3 % (ref 36.0–46.0)
HEMOGLOBIN: 12.5 g/dL (ref 12.0–15.0)
Lymphocytes Relative: 15 %
Lymphs Abs: 1.1 10*3/uL (ref 0.7–4.0)
MCH: 27.7 pg (ref 26.0–34.0)
MCHC: 32.6 g/dL (ref 30.0–36.0)
MCV: 84.7 fL (ref 78.0–100.0)
MONO ABS: 0.3 10*3/uL (ref 0.1–1.0)
MONOS PCT: 5 %
NEUTROS PCT: 80 %
Neutro Abs: 5.7 10*3/uL (ref 1.7–7.7)
PLATELETS: 283 10*3/uL (ref 150–400)
RBC: 4.52 MIL/uL (ref 3.87–5.11)
RDW: 14.2 % (ref 11.5–15.5)
WBC: 7.1 10*3/uL (ref 4.0–10.5)

## 2016-03-13 LAB — COMPREHENSIVE METABOLIC PANEL
ALBUMIN: 4 g/dL (ref 3.5–5.0)
ALT: 22 U/L (ref 14–54)
ANION GAP: 8 (ref 5–15)
AST: 26 U/L (ref 15–41)
Alkaline Phosphatase: 63 U/L (ref 38–126)
BUN: 13 mg/dL (ref 6–20)
CHLORIDE: 105 mmol/L (ref 101–111)
CO2: 26 mmol/L (ref 22–32)
Calcium: 9 mg/dL (ref 8.9–10.3)
Creatinine, Ser: 0.67 mg/dL (ref 0.44–1.00)
GFR calc non Af Amer: >60 mL/min (ref >60)
GLUCOSE: 120 mg/dL — AB (ref 65–99)
POTASSIUM: 4 mmol/L (ref 3.5–5.1)
SODIUM: 139 mmol/L (ref 135–145)
Total Bilirubin: 0.6 mg/dL (ref 0.3–1.2)
Total Protein: 7.2 g/dL (ref 6.5–8.1)

## 2016-03-13 NOTE — Progress Notes (Signed)
Patient alert and oriented, Post op day 1.  Provided support and encouragement.  Encouraged pulmonary toilet, ambulation and small sips of liquids.  All questions answered.  Will continue to monitor. 

## 2016-03-13 NOTE — Plan of Care (Signed)
Problem: Food- and Nutrition-Related Knowledge Deficit (NB-1.1) Goal: Nutrition education Formal process to instruct or train a patient/client in a skill or to impart knowledge to help patients/clients voluntarily manage or modify food choices and eating behavior to maintain or improve health. Outcome: Completed/Met Date Met: 03/13/16 Nutrition Education Note  Received consult for diet education per DROP protocol.   Discussed 2 week post op diet with pt. Emphasized that liquids must be non carbonated, non caffeinated, and sugar free. Fluid goals discussed. Reviewed progression of diet to include soft proteins at 7-10 days post-op. Pt to follow up with outpatient bariatric RD for further diet progression after 2 weeks. Multivitamins and minerals also reviewed. Teach back method used, pt expressed understanding, expect good compliance.   Diet: First 2 Weeks  You will see the dietitian about two (2) weeks after your surgery. The dietitian will increase the types of foods you can eat if you are handling liquids well:  If you have severe vomiting or nausea and cannot handle clear liquids lasting longer than 1 day, call your surgeon  Protein Shake  Drink at least 2 ounces of shake 5-6 times per day  Each serving of protein shakes (usually 8 - 12 ounces) should have a minimum of:  15 grams of protein  And no more than 5 grams of carbohydrate  Goal for protein each day:  Men = 80 grams per day  Women = 60 grams per day  Protein powder may be added to fluids such as non-fat milk or Lactaid milk or Soy milk (limit to 35 grams added protein powder per serving)   Hydration  Slowly increase the amount of water and other clear liquids as tolerated (See Acceptable Fluids)  Slowly increase the amount of protein shake as tolerated  Sip fluids slowly and throughout the day  May use sugar substitutes in small amounts (no more than 6 - 8 packets per day; i.e. Splenda)   Fluid Goal  The first goal is to  drink at least 8 ounces of protein shake/drink per day (or as directed by the nutritionist); some examples of protein shakes are Syntrax Nectar, Adkins Advantage, EAS Edge HP, and Unjury. See handout from pre-op Bariatric Education Class:  Slowly increase the amount of protein shake you drink as tolerated  You may find it easier to slowly sip shakes throughout the day  It is important to get your proteins in first  Your fluid goal is to drink 64 - 100 ounces of fluid daily  It may take a few weeks to build up to this  32 oz (or more) should be clear liquids  And  32 oz (or more) should be full liquids (see below for examples)  Liquids should not contain sugar, caffeine, or carbonation   Clear Liquids:  Water or Sugar-free flavored water (i.e. Fruit H2O, Propel)  Decaffeinated coffee or tea (sugar-free)  Crystal Lite, Wyler's Lite, Minute Maid Lite  Sugar-free Jell-O  Bouillon or broth  Sugar-free Popsicle: *Less than 20 calories each; Limit 1 per day   Full Liquids:  Protein Shakes/Drinks + 2 choices per day of other full liquids  Full liquids must be:  No More Than 12 grams of Carbs per serving  No More Than 3 grams of Fat per serving  Strained low-fat cream soup  Non-Fat milk  Fat-free Lactaid Milk  Sugar-free yogurt (Dannon Lite & Fit, Greek yogurt)     Patricia Balla, MS, RD, LDN Pager: [phone] After Hours Pager: [phone]    

## 2016-03-13 NOTE — Progress Notes (Signed)
Pt c/o gas pain and pressure in upper abd/epigastric area.  Pt unable to tolerate even small sips of water.  Pt up in hallway frequently and in rocking chair.  Will continue to encourage sips of water and ambulation.  zofran IV  given earlier in shift

## 2016-03-13 NOTE — Progress Notes (Signed)
Patient alert and oriented, pain is controlled. Patient is tolerating fluids, advanced to protein shake today, patient is tolerating well.  Reviewed Gastric sleeve discharge instructions with patient and patient is able to articulate understanding.  Provided information on BELT program, Support Group and [facility] outpatient pharmacy. All questions answered, will continue to monitor.  

## 2016-03-13 NOTE — Discharge Summary (Signed)
Physician Discharge Summary  Patricia [name] [id number] DOB: 1965/08/22 DOA: 03/12/2016  PCP: Odette Fraction, MD  Admit date: 03/12/2016 Discharge date: 03/13/2016  Recommendations for Outpatient Follow-up:  1.  (include homehealth, outpatient follow-up instructions, specific recommendations for PCP to follow-up on, etc.)  Follow-up Information    Patricia Skinner, MD. Go on 03/30/2016.   Specialty:  General Surgery Why:  at 11:45 AM for post-op check Contact information: [address] [place] [address] [phone]        Patricia Skinner, MD Follow up.   Specialty:  General Surgery Contact information: [place] [address] [phone]          Discharge Diagnoses:  Active Problems:   Obesity   Surgical Procedure: Laparoscopic Sleeve Gastrectomy, upper endoscopy  Discharge Condition: Good Disposition: Home  Diet recommendation: Postoperative sleeve gastrectomy diet (liquids only)  Filed Weights   03/12/16 0734  Weight: 120.7 kg (266 lb 2 oz)     Hospital Course:  The patient was admitted after undergoing laparoscopic sleeve gastrectomy. POD 0 she ambulated well. POD 1 she was started on the water diet protocol and tolerated 600 ml in the first shift. Once meeting the water amount she was advanced to bariatric protein shakes which they tolerated and were discharged home POD 1.  Treatments: surgery: laparoscopic sleeve gastrectomy  Discharge Instructions  Discharge Instructions    Ambulate hourly while awake    Complete by:  As directed    Call MD for:  difficulty breathing, headache or visual disturbances    Complete by:  As directed    Call MD for:  persistant dizziness or light-headedness    Complete by:  As directed    Call MD for:  persistant nausea and vomiting    Complete by:  As directed    Call MD for:  redness, tenderness, or signs of infection (pain, swelling, redness, odor or  green/yellow discharge around incision site)    Complete by:  As directed    Call MD for:  severe uncontrolled pain    Complete by:  As directed    Call MD for:  temperature >101 F    Complete by:  As directed    Diet bariatric full liquid    Complete by:  As directed    Discharge wound care:    Complete by:  As directed    Glue may peel off over next 3 weeks. Okay to shower today   Incentive spirometry    Complete by:  As directed    Perform hourly while awake     Allergies as of 03/13/2016   No Known Allergies     Medication List    TAKE these medications   fluticasone 50 MCG/ACT nasal spray Commonly known as:  FLONASE PLACE 2 SPRAYS INTO THE NOSE AT BEDTIME. What changed:  See the new instructions.   KLOR-CON M20 20 MEQ tablet Generic drug:  potassium chloride SA TAKE 1 TABLET EVERY DAY   losartan-hydrochlorothiazide 100-12.5 MG tablet Commonly known as:  HYZAAR TAKE 1 TABLET EVERY DAY What changed:  See the new instructions.   multivitamin capsule Take 1 capsule by mouth daily.   omeprazole 40 MG capsule Commonly known as:  PRILOSEC TAKE 1 CAPSULE BY MOUTH ONCE A DAY What changed:  See the new instructions.   ondansetron 4 MG tablet Commonly known as:  ZOFRAN Take 1 tablet (4 mg total) by mouth every 8 (eight) hours  as needed for nausea or vomiting (and as directed before meals).   PROBIOTIC DAILY PO Take 1 tablet by mouth daily.      Follow-up Information    Patricia Skinner, MD. Go on 03/30/2016.   Specialty:  General Surgery Why:  at 11:45 AM for post-op check Contact information: [address] [place] [address] [phone]        Patricia Skinner, MD Follow up.   Specialty:  General Surgery Contact information: Patricia [name] [address] [phone]            The results of significant diagnostics from this hospitalization (including imaging, microbiology, ancillary and laboratory) are  listed below for reference.    Significant Diagnostic Studies: No results found.  Labs: Basic Metabolic Panel:  Recent Labs Lab 03/12/16 1424 03/13/16 0436  NA  --  139  K  --  4.0  CL  --  105  CO2  --  26  GLUCOSE  --  120*  BUN  --  13  CREATININE 0.81 0.67  CALCIUM  --  9.0   Liver Function Tests:  Recent Labs Lab 03/13/16 0436  AST 26  ALT 22  ALKPHOS 63  BILITOT 0.6  PROT 7.2  ALBUMIN 4.0    CBC:  Recent Labs Lab 03/12/16 1424 03/13/16 0436  WBC 9.4 7.1  NEUTROABS  --  5.7  HGB 13.0  13.1 12.5  HCT 39.3  39.2 38.3  MCV 83.4 84.7  PLT 238 283    CBG:  Recent Labs Lab 03/12/16 0732 03/12/16 1201  GLUCAP 101* 105*    Active Problems:   Obesity   Time coordinating discharge: <69min

## 2016-03-13 NOTE — Progress Notes (Signed)
Pt's vitals are WNL, tolerating diet and pain is under control. Discussed discharge instructions with patient. All questions and concerns address. Discharged to home.

## 2016-03-13 NOTE — Progress Notes (Signed)
  Progress Note: Metabolic and Bariatric Surgery Service   Subjective: Gas pains overnight and some fullness with water, otherwise pain controlled, ambulating well  Objective: Vital signs in last 24 hours: Temp:  [97.5 F (36.4 C)-99.4 F (37.4 C)] 99.4 F (37.4 C) (12/19 0522) Pulse Rate:  [60-82] 62 (12/19 0522) Resp:  [11-21] 18 (12/19 0522) BP: (110-148)/(28-99) 122/66 (12/19 0522) SpO2:  [94 %-100 %] 96 % (12/19 0522) Last BM Date: 03/11/16  Intake/Output from previous day: 12/18 0701 - 12/19 0700 In: 2925 [P.O.:75; I.V.:2850] Out: 1300 [Urine:1250; Blood:50] Intake/Output this shift: No intake/output data recorded.  Lungs: CTAB  Cardiovascular: RRR  Abd: soft, NT, ND, incisions c/d/i  Extremities: no edema  Neuro: AOx4  Lab Results: CBC   Recent Labs  03/12/16 1424 03/13/16 0436  WBC 9.4 7.1  HGB 13.0  13.1 12.5  HCT 39.3  39.2 38.3  PLT 238 283   BMET  Recent Labs  03/12/16 1424 03/13/16 0436  NA  --  139  K  --  4.0  CL  --  105  CO2  --  26  GLUCOSE  --  120*  BUN  --  13  CREATININE 0.81 0.67  CALCIUM  --  9.0   PT/INR No results for input(s): LABPROT, INR in the last 72 hours. ABG No results for input(s): PHART, HCO3 in the last 72 hours.  Invalid input(s): PCO2, PO2  Studies/Results:  Anti-infectives: Anti-infectives    Start     Dose/Rate Route Frequency Ordered Stop   03/12/16 0727  cefoTEtan in Dextrose 5% (CEFOTAN) IVPB 2 g     2 g Intravenous On call to O.R. 03/12/16 0727 03/12/16 0943      Medications: Scheduled Meds: . enoxaparin (LOVENOX) injection  30 mg Subcutaneous Q12H  . fluticasone  1 spray Each Nare Daily  . pantoprazole (PROTONIX) IV  40 mg Intravenous QHS  . [START ON 03/14/2016] protein supplement shake  2 oz Oral Q2H   Continuous Infusions: . sodium chloride 75 mL/hr at 03/13/16 0156   PRN Meds:.oxyCODONE **AND** acetaminophen, acetaminophen (TYLENOL) oral liquid 160 mg/5 mL, morphine injection,  ondansetron (ZOFRAN) IV  Assessment/Plan: Patient Active Problem List   Diagnosis Date Noted  . Obesity 03/12/2016  . Palpitations 07/07/2013  . Acute sinusitis 04/21/2013  . Obstructive sleep apnea (adult) (pediatric) 08/25/2012  . Hypersomnia with sleep apnea, unspecified 08/25/2012  . Bilateral lower extremity edema 11/27/2011  . 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 Essential hypertension 11/28/2008  . IRRITABLE BOWEL SYNDROME - diarrhea predominant 11/10/2008   s/p Procedure(s): LAPAROSCOPIC GASTRIC SLEEVE RESECTION WITH UPPER ENDO 03/12/2016 -POD 1 protocol -potential discharge today  Disposition:  LOS: 1 day  The patient will be in the hospital for normal postop protocol  [name], MD [phone] [facility]

## 2016-03-21 ENCOUNTER — Other Ambulatory Visit: Payer: Self-pay | Admitting: Orthopedic Surgery

## 2016-03-21 DIAGNOSIS — M65311 Trigger thumb, right thumb: Secondary | ICD-10-CM

## 2016-03-21 DIAGNOSIS — M19041 Primary osteoarthritis, right hand: Secondary | ICD-10-CM

## 2016-03-22 ENCOUNTER — Ambulatory Visit
Admission: RE | Admit: 2016-03-22 | Discharge: 2016-03-22 | Disposition: A | Discharge status: Home / Self Care | Payer: 59 | Source: Ambulatory Visit | Attending: Orthopedic Surgery | Admitting: Orthopedic Surgery

## 2016-03-22 DIAGNOSIS — M65311 Trigger thumb, right thumb: Secondary | ICD-10-CM

## 2016-03-22 DIAGNOSIS — M19041 Primary osteoarthritis, right hand: Secondary | ICD-10-CM

## 2016-03-22 HISTORY — PX: LAPAROSCOPIC GASTRIC SLEEVE RESECTION: SHX5895

## 2016-03-27 ENCOUNTER — Telehealth (HOSPITAL_COMMUNITY): Payer: Self-pay

## 2016-03-27 ENCOUNTER — Encounter: Payer: 59 | Attending: General Surgery | Admitting: Dietician

## 2016-03-27 NOTE — Telephone Encounter (Signed)
Made discharge phone call to patient asking the following questions.    1. Do you have someone to care for you now that you are home?  Yes 2. Are you having pain now that is not relieved by your pain medication?  No 3. Are you able to drink the recommended daily amount of fluids (48 ounces minimum/day) and protein (60-80 grams/day) as prescribed by the dietitian or nutritional counselor?  Yes 4. Are you taking the vitamins and minerals as prescribed?  Yes 5. Do you have the "on call" number to contact your surgeon if you have a problem or question?  Yes, I see the dietician today and Dr.Kinsinger on Friday"  6. Are your incisions free of redness, swelling or drainage? (If steri strips, address that these can fall off, shower as tolerated) " Yes, my incisions look great"  7. Have your bowels moved since your surgery?  If not, are you passing gas?  "Yes, I'm having a bowel movement every day"  8. Are you up and walking 3-4 times per day?  Yes 9. Were you provided your discharge medications before your surgery or before you were discharged from the hospital and are you taking them without problem?  Yes    Deatrice Spanbauer RN

## 2016-03-28 NOTE — Progress Notes (Signed)
Bariatric Class:  Appt start time: 1530 end time:  1630.  2 Week Post-Operative Nutrition Class  Patient was seen on 03/27/2016 for Post-Operative Nutrition education at [facility].   Surgery date: 03/12/2016 Surgery type: Sleeve gastrectomy Start weight at [facility]: 275.4 lbs on 01/09/2016 Weight today: 252.4 lbs  Weight change: 19.8 lbs  TANITA  BODY COMP RESULTS  02/27/16 03/27/16   BMI (kg/m^2) 51.4 47.7   Fat Mass (lbs) 156.2 144.8   Fat Free Mass (lbs) 116 107.6   Total Body Water (lbs) 86.2 79.4   The following the learning objectives were met by the patient during this course:  Identifies Phase 3A (Soft, High Proteins) Dietary Goals and will begin from 2 weeks post-operatively to 2 months post-operatively  Identifies appropriate sources of fluids and proteins   States protein recommendations and appropriate sources post-operatively  Identifies the need for appropriate texture modifications, mastication, and bite sizes when consuming solids  Identifies appropriate multivitamin and calcium sources post-operatively  Describes the need for physical activity post-operatively and will follow MD recommendations  States when to call healthcare provider regarding medication questions or post-operative complications  Handouts given during class include:  Phase 3A: Soft, High Protein Diet Handout  Follow-Up Plan: Patient will follow-up at [facility] in 6 weeks for 2 month post-op nutrition visit for diet advancement per MD.

## 2016-03-30 DIAGNOSIS — M19041 Primary osteoarthritis, right hand: Secondary | ICD-10-CM | POA: Diagnosis not present

## 2016-04-25 DIAGNOSIS — K912 Postsurgical malabsorption, not elsewhere classified: Secondary | ICD-10-CM | POA: Diagnosis not present

## 2016-05-01 ENCOUNTER — Other Ambulatory Visit: Payer: Self-pay | Admitting: Family Medicine

## 2016-05-01 NOTE — Telephone Encounter (Signed)
Medication refilled per protocol. 

## 2016-05-08 ENCOUNTER — Encounter: Payer: 59 | Attending: General Surgery | Admitting: Dietician

## 2016-05-08 NOTE — Patient Instructions (Addendum)
Goals:  Follow Phase 3B: High Protein + Non-Starchy Vegetables  Eat 3-6 small meals/snacks, every 3-5 hrs  Increase lean protein foods to meet 60g goal  Increase fluid intake to 64oz +  Avoid drinking 15 minutes before, during and 30 minutes after eating  Aim for >30 min of physical activity daily  Try hot tea for constipation  Surgery date: 03/12/2016 Surgery type: Sleeve gastrectomy Start weight at [facility]: 275.4 lbs on 01/09/2016 Weight today: 240.6 lbs Weight change: 12 lbs Total weight lost: 35 lbs Goal weight: 175 lbs  TANITA  BODY COMP RESULTS  02/27/16 03/27/16 05/08/16   BMI (kg/m^2) 51.4 47.7 45.5   Fat Mass (lbs) 156.2 144.8 131.2   Fat Free Mass (lbs) 116 107.6 109.4   Total Body Water (lbs) 86.2 79.4 80.2

## 2016-05-08 NOTE — Progress Notes (Signed)
  Follow-up visit:  8 Weeks Post-Operative Sleeve gastrectomy Surgery  Medical Nutrition Therapy:  Appt start time: 0940 end time:  1010  Primary concerns today: Post-operative Bariatric Surgery Nutrition Management. Patricia [name] returns having lost a total of 35 pounds. She reports she is struggling with constipation. Eating every 2 hours. Tolerates seafood better than chicken.   Surgery date: 03/12/2016 Surgery type: Sleeve gastrectomy Start weight at [facility]: 275.4 lbs on 01/09/2016 Weight today: 240.6 lbs Weight change: 12 lbs Total weight lost: 35 lbs Goal weight: 175 lbs  TANITA  BODY COMP RESULTS  02/27/16 03/27/16 05/08/16   BMI (kg/m^2) 51.4 47.7 45.5   Fat Mass (lbs) 156.2 144.8 131.2   Fat Free Mass (lbs) 116 107.6 109.4   Total Body Water (lbs) 86.2 79.4 80.2    Preferred Learning Style:   No preference indicated   Learning Readiness:   Ready  24-hr recall: B (8:30-9 AM): Premier shake and 2 hard boiled eggs (42g) Snk (11-11:30AM): Pepperoni or cheese stick (6g)  L (1:30-2 PM): 4 shrimp, jello (14g) Snk (PM): none  D (5-5:30 PM): 1-1.5 oz salmon or chicken or ground [place] or pork (10g)  Snk (PM): sugar free popsicles or yogurt (0-15g)  Fluid intake: 64 oz Powerade Zero and water Estimated total protein intake: 75-90 g/day  Medications: see list Supplementation: taking  Using straws: no Drinking while eating: no Hair loss: a little Carbonated beverages: no N/V/D/C: Miralax as needed for constipation, vomited 1x after overeating Dumping syndrome: no  Recent physical activity:  Walking, starts BELT tomorrow  Progress Towards Goal(s):  In progress.  Handouts given during visit include:  Phase 3B lean protein + non starchy vegetables   Nutritional Diagnosis:  Grant-3.3 Overweight/obesity related to past poor dietary habits and physical inactivity as evidenced by patient w/ recent sleeve gastrectomy surgery following dietary guidelines for continued weight  loss.     Intervention:  Nutrition counseling provided.  Teaching Method Utilized:  Visual Auditory Hands on  Barriers to learning/adherence to lifestyle change: none  Demonstrated degree of understanding via:  Teach Back   Monitoring/Evaluation:  Dietary intake, exercise, and body weight. Follow up in 6 weeks for 3.5 month post-op visit.

## 2016-05-09 ENCOUNTER — Encounter: Payer: Self-pay | Admitting: Dietician

## 2016-06-14 ENCOUNTER — Ambulatory Visit: Payer: 59 | Admitting: Family Medicine

## 2016-06-15 ENCOUNTER — Encounter: Payer: Self-pay | Admitting: Family Medicine

## 2016-06-15 ENCOUNTER — Ambulatory Visit (INDEPENDENT_AMBULATORY_CARE_PROVIDER_SITE_OTHER): Payer: 59 | Admitting: Family Medicine

## 2016-06-15 VITALS — BP 126/68 | HR 98 | Temp 98.3°F | Resp 18 | Wt 243.0 lb

## 2016-06-15 DIAGNOSIS — M5431 Sciatica, right side: Secondary | ICD-10-CM

## 2016-06-15 MED ORDER — PREDNISONE 20 MG PO TABS: ORAL_TABLET | ORAL | 0 refills | Status: DC

## 2016-06-15 NOTE — Progress Notes (Signed)
Subjective:    Patient ID: Patricia [name], female    DOB: June 02, 1965, 51 y.o.   MRN: [id number]  HPI Patient is a very pleasant 51 year old African-American female who presents with Right-sided lower back pain radiating into her right gluteus and down the posterior aspect of her right leg into her right foot. She had a similar episode in September of last year that was successfully treated with a prednisone taper pack. At that time x-rays of the lumbar spine revealed mild degenerative disc disease at L3-L4 and L4-L5 as well as facet arthritic changes at L4-L5 and L5-S1. I reviewed the x-rays in detail with the patient here today. The findings are not impressive on x-ray but her story and history is consistent with sciatica. Pain is exacerbated by prolonged sitting. She feels better if she lays down. She also has similar pain with prolonged standing. The pain begins in her gluteus and radiates all the way to her right foot. This time, the flare began on Sunday Past Medical History:  Diagnosis Date  . Arthritis    "spine-DDD-intermittent sciatic pain"  . Chronic cervicitis    resolved  . Diabetes mellitus without complication (HCC)    diet controlled  . GERD (gastroesophageal reflux disease)   . Heart murmur    "benign"-routinely sees yearly - Dr. Deretha Emory, cardiology  . History of kidney stones    multiple kidney stones-passed.  . Hypertension   . IBS (irritable bowel syndrome)    controlled with diet  . Menorrhagia    resolved s/p Hysterectomy  . Obesity   . Sleep apnea    cpap used nightly   Past Surgical History:  Procedure Laterality Date  . BREAST LUMPECTOMY Left    benign hamartoma  . CESAREAN SECTION     x1  . COLONOSCOPY  10/19/2005  . ESOPHAGOGASTRODUODENOSCOPY  12/21/2008  . HYSTEROSCOPY W/D&C     resection of endometrial polyp  . LAPAROSCOPIC GASTRIC SLEEVE RESECTION N/A 03/12/2016   Procedure: LAPAROSCOPIC GASTRIC SLEEVE RESECTION WITH UPPER ENDO;  Surgeon:  Arta Bruce Kinsinger, MD;  Location: [facility];  Service: General;  Laterality: N/A;  . POLYPECTOMY     uterus  . TUBAL LIGATION    . uterine ablation    . VAGINAL HYSTERECTOMY     fibroids   Current Outpatient Prescriptions on File Prior to Visit  Medication Sig Dispense Refill  . fluticasone (FLONASE) 50 MCG/ACT nasal spray PLACE 2 SPRAYS INTO THE NOSE AT BEDTIME. (Patient taking differently: PLACE 2 SPRAYS INTO THE NOSE AT BEDTIME AS NEEDED FOR ALLERGIES) 16 g 4  . KLOR-CON M20 20 MEQ tablet TAKE 1 TABLET EVERY DAY 30 tablet 11  . losartan-hydrochlorothiazide (HYZAAR) 100-12.5 MG tablet TAKE 1 TABLET EVERY DAY 90 tablet 0  . Multiple Vitamin (MULTIVITAMIN) capsule Take 1 capsule by mouth daily.    Marland Kitchen omeprazole (PRILOSEC) 40 MG capsule TAKE 1 CAPSULE BY MOUTH ONCE A DAY (Patient taking differently: TAKE 1 CAPSULE BY MOUTH ONCE A DAY- takes bedtime) 30 capsule 11  . Probiotic Product (PROBIOTIC DAILY PO) Take 1 tablet by mouth daily.     No current facility-administered medications on file prior to visit.    No Known Allergies Social History   Social History  . Marital status: Married    Spouse name: Montine Circle  . Number of children: 2  . Years of education: 16   Occupational History  . 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 Smoking status:  Never Smoker  . Smokeless tobacco: Never Used  . Alcohol use 0.0 oz/week     Comment: socially  . Drug use: No  . Sexual activity: Yes    Birth control/ protection: Surgical   Other Topics Concern  . Not on file   Social History Narrative   Patient is right handed and consumes green tea daily      Review of Systems  Musculoskeletal: Positive for back pain.  All other systems reviewed and are negative.      Objective:   Physical Exam  Cardiovascular: Normal rate, regular rhythm and normal heart sounds.   Pulmonary/Chest: Effort normal and breath sounds normal.  Musculoskeletal:       Lumbar back: She  exhibits normal range of motion, no tenderness and no pain.  Neurological: She has normal strength. No cranial nerve deficit or sensory deficit. She displays a negative Romberg sign.  Vitals reviewed.  Straight leg raise is negative.      Assessment & Plan:  Sciatica of right side  Symptoms sound consistent with sciatica. Again prednisone taper pack. Recommended continued weight loss. Congratulated patient on the 30 pounds of weight loss she is achieved since December when she had her gastric bypass surgery. Also discussed physical therapy versus chiropractor. Patient may want consider an inversion table when she has an exacerbation

## 2016-06-19 ENCOUNTER — Encounter: Payer: Self-pay | Admitting: Skilled Nursing Facility1

## 2016-06-19 ENCOUNTER — Encounter: Payer: 59 | Attending: General Surgery | Admitting: Skilled Nursing Facility1

## 2016-06-19 DIAGNOSIS — IMO0001 Reserved for inherently not codable concepts without codable children: Secondary | ICD-10-CM

## 2016-06-19 NOTE — Progress Notes (Signed)
  Follow-up visit:  8 Weeks Post-Operative Sleeve gastrectomy Surgery  Medical Nutrition Therapy:  Appt start time: 0940 end time:  1010  Primary concerns today: Post-operative Bariatric Surgery Nutrition Management. Pt states she feels great! Pt states she really wants to eat fruit but does not want to slow her weight loss down any further than it already is going so she will check in next time to see how her weight loss is going and see if fruit can be added.  Surgery date: 03/12/2016 Surgery type: Sleeve gastrectomy Start weight at [facility]: 275.4 lbs on 01/09/2016 Weight today: 234.6 lbs Weight change: 6 lbs Total weight lost: 41 lbs Goal weight: 175 lbs  TANITA  BODY COMP RESULTS  02/27/16 03/27/16 05/08/16 06/19/2016   BMI (kg/m^2) 51.4 47.7 45.5 44.3   Fat Mass (lbs) 156.2 144.8 131.2 125.2   Fat Free Mass (lbs) 116 107.6 109.4 109.4   Total Body Water (lbs) 86.2 79.4 80.2 80    Preferred Learning Style:   No preference indicated   Learning Readiness:   Ready  24-hr recall: B (8:30-9 AM): Premier shake and 2 hard boiled eggs (42g) Snk (11-11:30AM): Pepperoni or cheese stick (6g)  L (1:30-2 PM): rollup with salad (14g) Snk (PM): pork rinds (7g) or sugar free popcicle D (5-5:30 PM): 3 oz salmon or chicken or ground [place] or pork and vegetables (10g)  Snk (PM): sugar free popsicles or yogurt (0-15g)  Fluid intake: 64 oz Powerade Zero and water Estimated total protein intake: 75-90 g/day  Medications: see list Supplementation: taking  Using straws: no Drinking while eating: no Hair loss: a little Carbonated beverages: no N/V/D/C: NO Dumping syndrome: no Using straws: no Drinking while eating: no Having you been chewing well: no Chewing/swallowing difficulties: no Changes in vision: no Changes to mood/headaches: no Hair loss/Cahnges to skin/Changes to nails: no Any difficulty focusing or concentrating: no Sweating: no Dizziness/Lightheaded: no Palpitations:  no   Recent physical activity:  BELT consistently  Progress Towards Goal(s):  In progress.  Handouts given during visit include:  Phase 3B lean protein + non starchy vegetables   Nutritional Diagnosis:  [place]ut-3.3 Overweight/obesity related to past poor dietary habits and physical inactivity as evidenced by patient w/ recent sleeve gastrectomy surgery following dietary guidelines for continued weight loss.     Intervention:  Nutrition counseling provided.  Teaching Method Utilized:  Visual Auditory Hands on  Barriers to learning/adherence to lifestyle change: none  Demonstrated degree of understanding via:  Teach Back   Monitoring/Evaluation:  Dietary intake, exercise, and body weight.

## 2016-07-26 DIAGNOSIS — K912 Postsurgical malabsorption, not elsewhere classified: Secondary | ICD-10-CM | POA: Diagnosis not present

## 2016-07-26 DIAGNOSIS — Z9884 Bariatric surgery status: Secondary | ICD-10-CM | POA: Diagnosis not present

## 2016-08-06 ENCOUNTER — Other Ambulatory Visit: Payer: Self-pay | Admitting: Family Medicine

## 2016-08-06 ENCOUNTER — Ambulatory Visit: Payer: 59 | Admitting: Neurology

## 2016-08-06 NOTE — Telephone Encounter (Signed)
Patient needs to be seen before any further refills 

## 2016-08-08 ENCOUNTER — Encounter: Payer: 59 | Attending: General Surgery | Admitting: Skilled Nursing Facility1

## 2016-08-08 ENCOUNTER — Encounter: Payer: Self-pay | Admitting: Skilled Nursing Facility1

## 2016-08-08 DIAGNOSIS — Z6841 Body Mass Index (BMI) 40.0 and over, adult: Secondary | ICD-10-CM

## 2016-08-08 NOTE — Patient Instructions (Addendum)
-  Keep a log in Myfitness Pal  -Try to drink water during your workout and if your are still dizzy after doing that then try a small peice of fruit

## 2016-08-08 NOTE — Progress Notes (Signed)
  Follow-up visit:  8 Weeks Post-Operative Sleeve gastrectomy Surgery  Medical Nutrition Therapy:  Appt start time: 0940 end time:  1010  Primary concerns today: Post-operative Bariatric Surgery Nutrition Management. Pt arrives discouraged she has not lost her weight faster and more of it. Pt states her surgeon has put her on metformin due to having polycystic ovaries which may play a role in her slower weight loss. Pt states she has also had her thyroid checked and some other labs drawn to delve deeper into her slow weight loss.   Surgery date: 03/12/2016 Surgery type: Sleeve gastrectomy Start weight at [facility]: 275.4 lbs on 01/09/2016 Weight today: 231.8 lbs Weight change: 2.8 lbs Total weight lost: 43.8 lbs Goal weight: 175 lbs  TANITA  BODY COMP RESULTS  02/27/16 03/27/16 05/08/16 06/19/2016 08/08/2016   BMI (kg/m^2) 51.4 47.7 45.5 44.3 43.8   Fat Mass (lbs) 156.2 144.8 131.2 125.2 122.4   Fat Free Mass (lbs) 116 107.6 109.4 109.4 109.4   Total Body Water (lbs) 86.2 79.4 80.2 80 79.8    Preferred Learning Style:   No preference indicated   Learning Readiness:   Ready  24-hr recall: B (8:30-9 AM): Premier shake (30g) OR 2 hard boiled eggs (14g) Snk (11-11:30AM): Pepperoni or cheese stick or peanuts  (6g)  L (1:30-2 PM): rollup with salad (14g) OR premier shake (30g) Snk (PM): nuts and cheese and raisins  D (5-5:30 PM): 3 oz salmon or chicken or ground [place] or pork and vegetables (10g)  Snk (PM): sugar free popsicles or yogurt (0-15g)  Fluid intake: 64 oz Powerade Zero and water and 1 protein shake  Estimated total protein intake: 75-90 g/day  Medications: see list Supplementation: taking  Using straws: no Drinking while eating: no Hair loss: a little Carbonated beverages: no N/V/D/C: NO Dumping syndrome: no Using straws: no Drinking while eating: no Having you been chewing well: no Chewing/swallowing difficulties: no Changes in vision: no Changes to  mood/headaches: no Hair loss/Changes to skin/Changes to nails: no Any difficulty focusing or concentrating: no Sweating: no Dizziness/Lightheaded: dizzy sometimes after working out Palpitations: no   Recent physical activity:  BELT consistently  Progress Towards Goal(s):  In progress.  Handouts given during visit include:  Phase 3B lean protein + non starchy vegetables   Nutritional Diagnosis:  -3.3 Overweight/obesity related to past poor dietary habits and physical inactivity as evidenced by patient w/ recent sleeve gastrectomy surgery following dietary guidelines for continued weight loss.    Intervention:  Nutrition counseling provided. Goals: -Keep a log in Myfitness Pal -Try to drink water during your workout and if your are still dizzy after doing that then try a small peice of fruit  Teaching Method Utilized:  Visual Auditory Hands on  Barriers to learning/adherence to lifestyle change: none  Demonstrated degree of understanding via:  Teach Back   Monitoring/Evaluation:  Dietary intake, exercise, and body weight.

## 2016-08-09 ENCOUNTER — Telehealth: Payer: Self-pay

## 2016-08-09 NOTE — Telephone Encounter (Signed)
I spoke to patient and she is aware of appt on Monday and will bring in [place] card.

## 2016-08-13 ENCOUNTER — Ambulatory Visit (INDEPENDENT_AMBULATORY_CARE_PROVIDER_SITE_OTHER): Payer: 59 | Admitting: Neurology

## 2016-08-13 ENCOUNTER — Encounter: Payer: Self-pay | Admitting: Neurology

## 2016-08-13 VITALS — BP 122/64 | HR 80 | Resp 16 | Ht 61.5 in | Wt 230.0 lb

## 2016-08-13 DIAGNOSIS — Z9989 Dependence on other enabling machines and devices: Secondary | ICD-10-CM | POA: Diagnosis not present

## 2016-08-13 DIAGNOSIS — G4733 Obstructive sleep apnea (adult) (pediatric): Secondary | ICD-10-CM

## 2016-08-13 NOTE — Progress Notes (Signed)
Subjective:    Patient ID: Patricia [name] is a 51 y.o. female.  HPI      Interim history:   Patricia [name] is a very pleasant 51 year old right-handed woman with an underlying medical history of irritable bowel syndrome, obesity, hypertension, reflux disease as well as kidney stones, who presents for followup consultation of her obstructive sleep apnea, on treatment with CPAP. She is unaccompanied today. I last saw her on 08/03/2015, at which time she was doing well, she was compliant with CPAP therapy. She reported ongoing good results with CPAP.  Of note, in the interim, she had gastric sleeve bariatric surgery on 03/12/2016.  Today, 08/13/2016: I reviewed her CPAP compliance data from 07/14/2016 through 08/12/2016 which is a total of 30 days, during which time she used her CPAP every night with percent used days greater than 4 hours at 100%, indicating superb compliance with an average usage of 6 hours and 35 minutes, residual AHI 0.6 per hour, leak on the low side for the 95th percentile at 6.4 L/m on a pressure of 8 cm with EPR of 3. She reports Doing well with CPAP. She continues to use it regularly. She even travels with it, even though traveling with this machine is cumbersome. She is wondering if she can have a second smaller CPAP machine for travel purposes. She feels well, she has been working on weight loss, has done really well after her weight loss surgery. She has lost over 40 pounds since her weight loss surgery in December 2017.   The patient's allergies, current medications, family history, past medical history, past social history, past surgical history and problem list were reviewed and updated as appropriate.   Previously (copied from previous notes for reference):   Of note, she canceled an appointment for 01/31/2015. I saw her on 01/25/2014, at which time she reported doing well with CPAP. She was compliant with treatment. She had resolution of her morning headaches and better  daytime energy. She was working on weight loss. I suggested a one-year checkup.    I reviewed her CPAP compliance data from 07/04/2015 through 08/02/2015 which is a total of 30 days during which time she used her machine every night with percent used days greater than 4 hours at 100%, indicating superb compliance with an average usage of 6 hours and 44 minutes, residual AHI low at 0.4 per hour, leak low with the 95th percentile at 7.5 L/m on a pressure of 8 cm with EPR of 3.   Of note she did not show for an appointment on 05/18/2013 as well as 09/29/2013. I saw her on 11/14/2012, at which time we discussed her baseline sleep study as well as CPAP titration study findings as well as her initial compliance data. She reported good results with CPAP and resolution of her morning headaches, but had ongoing issues with nasal congestion, rhinorrhea and postnasal drip. I prescribed Flonase for nighttime use as well as requested that she start using nasal saline rinses during the day. I reviewed her compliance data at the time and congratulated her on her good compliance. We considered referral to an allergy specialist. I reviewed the patient's CPAP compliance data from 11/30/2012 to 12/29/2012, which is a total of 30 days, during which time the patient used CPAP every day. The average usage for all days was 5 hours and 51 minutes. The percent used days greater than 4 hours was 90 %, indicating excellent compliance. The residual AHI was 0.5 per hour, indicating a  very appropriate treatment pressure of 8 cwp with EPR of 3.   I reviewed her compliance data from 12/26/2013 through 01/24/2014 which is a total of 30 days during which time she was under percent compliant, average usage of 6 hours and 40 minutes, residual AHI low at 0.5 per hour, leak low at 4.2 L/m for the 95th percentile, pressure at 8 cm with EPR of 3.   I first met her on 08/01/2012, at which time she reported nonrestorative sleep, morning headaches,  daytime somnolence as well as snoring. I asked her to come back for sleep study and she had a baseline sleep test on 08/12/2012: She had an increased percentage of stage II sleep. Sleep efficiency was reduced at 76%. She had mild to moderate snoring. She had a total of 9 obstructive apneas and 49 obstructive hypopneas, resulting in an AHI of 10.4 per hour, markedly worse in REM sleep with 43.9 events per hour. Baseline oxygen saturation was 94% and her nadir was 77% in REM sleep. Based on the degree of desaturation I asked her to come back for a CPAP titration study. She had that test on 09/08/2012: Sleep efficiency was normal at 92.9%. She had a normal percentage of stage I and 2 sleep, 12.3 percent of deep sleep and an increased percentage of REM sleep at 35.2% with a reduced REM latency of 54 minutes. She had mild pruritic leg movements at 7.9 per hour but no associated arousals from this. She was titrated on CPAP using a nasal pillows mask from 5-8 cm and had a residual AHI of 0 per hour on 8 cm of pressure. Her baseline oxygen saturation was 96%, her nadir was 88% with less than 1 minute below the saturation of 90% for the night. Based on those test results I prescribed CPAP for her. I reviewed compliance data from 10/03/2012 through 11/09/2012 (38 days), during which time she used it every day except for one day. Percent used days greater than 4 hours was 82% indicating good compliance. Her average usage was 5 hours and 3 minutes for all days and her residual AHI was 0.9 per hour indicating an adequate treatment pressure of 8 cm of water with EPR of 3.   Her Past Medical History Is Significant For: Past Medical History:  Diagnosis Date  . Arthritis    "spine-DDD-intermittent sciatic pain"  . Chronic cervicitis    resolved  . Diabetes mellitus without complication (HCC)    diet controlled  . GERD (gastroesophageal reflux disease)   . Heart murmur    "benign"-routinely sees yearly - Dr. Deretha Emory, cardiology  . History of kidney stones    multiple kidney stones-passed.  . Hypertension   . IBS (irritable bowel syndrome)    controlled with diet  . Menorrhagia    resolved s/p Hysterectomy  . Obesity   . Sleep apnea    cpap used nightly    Her Past Surgical History Is Significant For: Past Surgical History:  Procedure Laterality Date  . BREAST LUMPECTOMY Left    benign hamartoma  . CESAREAN SECTION     x1  . COLONOSCOPY  10/19/2005  . ESOPHAGOGASTRODUODENOSCOPY  12/21/2008  . HYSTEROSCOPY W/D&C     resection of endometrial polyp  . LAPAROSCOPIC GASTRIC SLEEVE RESECTION N/A 03/12/2016   Procedure: LAPAROSCOPIC GASTRIC SLEEVE RESECTION WITH UPPER ENDO;  Surgeon: Arta Bruce Kinsinger, MD;  Location: [facility];  Service: General;  Laterality: N/A;  . POLYPECTOMY     uterus  .  TUBAL LIGATION    . uterine ablation    . VAGINAL HYSTERECTOMY     fibroids    Her Family History Is Significant For: Family History  Problem Relation Age of Onset  . Hypertension Father   . Hypertension Mother   . Diabetes Mother   . Coronary artery disease Mother   . Cancer Brother        carcinoid tumer of the appendix    Her Social History Is Significant For: Social History   Social History  . Marital status: Married    Spouse name: Patricia [name]  . Number of children: 2  . Years of education: 16   Occupational History  . 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 Smoking status: Never Smoker  . Smokeless tobacco: Never Used  . Alcohol use 0.0 oz/week     Comment: socially  . Drug use: No  . Sexual activity: Yes    Birth control/ protection: Surgical   Other Topics Concern  . None   Social History Narrative   Patient is right handed and consumes green tea daily    Her Allergies Are:  No Known Allergies:   Her Current Medications Are:  Outpatient Encounter Prescriptions as of 08/13/2016  Medication Sig  . fluticasone (FLONASE) 50 MCG/ACT nasal  spray PLACE 2 SPRAYS INTO THE NOSE AT BEDTIME. (Patient taking differently: PLACE 2 SPRAYS INTO THE NOSE AT BEDTIME AS NEEDED FOR ALLERGIES)  . KLOR-CON M20 20 MEQ tablet TAKE 1 TABLET EVERY DAY  . losartan-hydrochlorothiazide (HYZAAR) 100-12.5 MG tablet TAKE 1 TABLET BY MOUTH EVERY DAY  . metFORMIN (GLUCOPHAGE-XR) 500 MG 24 hr tablet Take 500 mg by mouth daily.  . Multiple Vitamin (MULTIVITAMIN) capsule Take 1 capsule by mouth daily.  . predniSONE (DELTASONE) 20 MG tablet 3 tabs poqday 1-2, 2 tabs poqday 3-4, 1 tab poqday 5-6 (Patient taking differently: as needed. 3 tabs poqday 1-2, 2 tabs poqday 3-4, 1 tab poqday 5-6)  . Probiotic Product (PROBIOTIC DAILY PO) Take 1 tablet by mouth daily.  . [DISCONTINUED] omeprazole (PRILOSEC) 40 MG capsule TAKE 1 CAPSULE BY MOUTH ONCE A DAY (Patient taking differently: TAKE 1 CAPSULE BY MOUTH ONCE A DAY- takes bedtime)   No facility-administered encounter medications on file as of 08/13/2016.   :  Review of Systems:  Out of a complete 14 point review of systems, all are reviewed and negative with the exception of these symptoms as listed below: Review of Systems  Neurological:       Patient states that she is doing well with CPAP. No new concerns.   Reports that she has trouble with her sciatic nerve, being treat by PCP.     Objective:  Neurologic Exam  Physical Exam Physical Examination:   Vitals:   08/13/16 1045  BP: 122/64  Pulse: 80  Resp: 16    General Examination: The patient is a very pleasant 51 y.o. female in no acute distress. She appears well-developed and well-nourished and well groomed. She is in good spirits today.  HEENT: Normocephalic, atraumatic, pupils are equal, round and reactive to light and accommodation. Extraocular tracking is good without limitation to gaze excursion or nystagmus noted. Normal smooth pursuit is noted. Hearing is grossly intact. Face is symmetric with normal facial animation and normal facial sensation.  Speech is clear with no dysarthria noted. There is no hypophonia. There is no lip, neck/head, jaw or voice tremor. Oropharynx exam reveals: good dental hygiene and moderate airway crowding.  Mallampati is class II. Tongue protrudes centrally and palate elevates symmetrically. Mild mouth dryness noted.  Chest: Clear to auscultation without wheezing, rhonchi or crackles noted.  Heart: S1+S2+0, regular and normal without murmurs, rubs or gallops noted.   Abdomen: Soft, non-tender and non-distended with normal bowel sounds appreciated on auscultation.  Extremities: There is no new abnormality.   Skin: Warm and dry without trophic changes noted. There are no varicose veins.  Musculoskeletal: exam reveals no obvious joint deformities, tenderness or joint swelling or erythema.   Neurologically:  Mental status: The patient is awake, alert and oriented in all 4 spheres. Her memory, attention, language and knowledge are appropriate. There is no aphasia, agnosia, apraxia or anomia. Speech is clear with normal prosody and enunciation. Thought process is linear. Mood is congruent and affect is normal.  Cranial nerves are as described above under HEENT exam.  Motor exam: Normal bulk, strength and tone is noted. There is no drift, tremor or rebound. Romberg is negative. Reflexes are 2+ throughout. Fine motor skills are grossly intact in the UEs and LEs.  Cerebellar testing shows no dysmetria or intention tremor. There is no truncal or gait ataxia.   Sensory exam is intact in the upper and lower extremities.  Gait, station and balance are unremarkable. No veering to one side is noted. No leaning to one side is noted. Posture is age-appropriate and stance is narrow based. No problems turning are noted. Tandem walk is unremarkable.        Assessment and Plan:   In summary, Patricia [name] is a very pleasant 51 year old female with an underlying medical history of irritable bowel syndrome, obesity,  hypertension, reflux disease, kidney stones and recent s/p gastric sleeve in December 2017, who presents for followup consultation of her obstructive sleep apnea, treated with CPAP treatment at a pressure of 8 cwp with good results and excellent compliance. Her physical exam is stableWith the exception of notable weight loss after her weight loss surgery. She is doing well in that regard and is congratulated on her weight loss success and commended for her ongoing CPAP compliance. Next year around this time, we can certainly consider repeat sleep study testing if she wishes to proceed at the time. She is not particularly bothered by using CPAP and has ongoing good tolerance and good results. We reviewed her compliance data for the last 30 days and also briefly her sleep study results from 08/12/2012. She had her CPAP titration study on 09/08/2012. I provided her with a prescription for a secondary CPAP machine for travel purposes. She can use a travel unit without humidifier for this purpose. I suggested a one-year checkup, sooner as needed. I answered all her questions today and she was in agreement. I spent 15 minutes in total face-to-face time with the patient, more than 50% of which was spent in counseling and coordination of care, reviewing test results, reviewing medication and discussing or reviewing the diagnosis of OSA, its prognosis and treatment options. Pertinent laboratory and imaging test results that were available during this visit with the patient were reviewed by me and considered in my medical decision making (see chart for details).

## 2016-08-13 NOTE — Patient Instructions (Signed)
Please continue using your CPAP regularly. While your insurance requires that you use CPAP at least 4 hours each night on 70% of the nights, I recommend, that you not skip any nights and use it throughout the night if you can. Getting used to CPAP and staying with the treatment long term does take time and patience and discipline. Untreated obstructive sleep apnea when it is moderate to severe can have an adverse impact on cardiovascular health and raise her risk for heart disease, arrhythmias, hypertension, congestive heart failure, stroke and diabetes. Untreated obstructive sleep apnea causes sleep disruption, nonrestorative sleep, and sleep deprivation. This can have an impact on your day to day functioning and cause daytime sleepiness and impairment of cognitive function, memory loss, mood disturbance, and problems focussing. Using CPAP regularly can improve these symptoms.  Keep up the good work! I will see you back in one year. I have entered a prescription for a second, smaller CPAP machine for travel purposes.

## 2016-09-19 ENCOUNTER — Ambulatory Visit: Payer: 59 | Admitting: Skilled Nursing Facility1

## 2016-10-17 DIAGNOSIS — N760 Acute vaginitis: Secondary | ICD-10-CM | POA: Diagnosis not present

## 2016-10-17 DIAGNOSIS — B372 Candidiasis of skin and nail: Secondary | ICD-10-CM | POA: Diagnosis not present

## 2016-11-07 ENCOUNTER — Other Ambulatory Visit: Payer: Self-pay | Admitting: Family Medicine

## 2016-11-14 DIAGNOSIS — Z9884 Bariatric surgery status: Secondary | ICD-10-CM | POA: Diagnosis not present

## 2016-11-16 ENCOUNTER — Other Ambulatory Visit (HOSPITAL_COMMUNITY): Payer: Self-pay | Admitting: General Surgery

## 2016-11-16 DIAGNOSIS — E669 Obesity, unspecified: Secondary | ICD-10-CM

## 2016-11-22 ENCOUNTER — Ambulatory Visit (HOSPITAL_COMMUNITY)
Admission: RE | Admit: 2016-11-22 | Discharge: 2016-11-22 | Disposition: A | Discharge status: Home / Self Care | Payer: 59 | Source: Ambulatory Visit | Attending: General Surgery | Admitting: General Surgery

## 2016-11-22 DIAGNOSIS — E669 Obesity, unspecified: Secondary | ICD-10-CM | POA: Diagnosis not present

## 2016-12-06 ENCOUNTER — Ambulatory Visit: Payer: 59 | Admitting: Family Medicine

## 2017-01-18 ENCOUNTER — Ambulatory Visit: Payer: 59

## 2017-01-29 ENCOUNTER — Ambulatory Visit (INDEPENDENT_AMBULATORY_CARE_PROVIDER_SITE_OTHER): Payer: 59

## 2017-01-29 DIAGNOSIS — Z23 Encounter for immunization: Secondary | ICD-10-CM | POA: Diagnosis not present

## 2017-01-29 NOTE — Progress Notes (Signed)
Patient was seen in office for flu vaccine. Patient received vaccine in right deltoid. Patient tolerated well

## 2017-02-04 ENCOUNTER — Other Ambulatory Visit: Payer: Self-pay | Admitting: Family Medicine

## 2017-02-04 NOTE — Telephone Encounter (Signed)
Medication refill for one time only.  Patient needs to be seen.  Letter sent for patient to call and schedule 

## 2017-02-08 DIAGNOSIS — Z1231 Encounter for screening mammogram for malignant neoplasm of breast: Secondary | ICD-10-CM | POA: Diagnosis not present

## 2017-02-08 LAB — HM MAMMOGRAPHY

## 2017-02-13 ENCOUNTER — Encounter: Payer: Self-pay | Admitting: Family Medicine

## 2017-02-27 ENCOUNTER — Encounter: Payer: Self-pay | Admitting: Internal Medicine

## 2017-03-07 ENCOUNTER — Other Ambulatory Visit: Payer: Self-pay | Admitting: Family Medicine

## 2017-03-07 NOTE — Telephone Encounter (Signed)
Medication refill for one time only.  Patient needs to be seen.  Letter sent for patient to call and schedule 

## 2017-03-08 ENCOUNTER — Other Ambulatory Visit: Payer: Self-pay | Admitting: Family Medicine

## 2017-03-09 ENCOUNTER — Other Ambulatory Visit: Payer: Self-pay | Admitting: Family Medicine

## 2017-03-14 ENCOUNTER — Encounter: Payer: Self-pay | Admitting: Internal Medicine

## 2017-04-04 ENCOUNTER — Other Ambulatory Visit: Payer: Self-pay | Admitting: Family Medicine

## 2017-04-05 ENCOUNTER — Other Ambulatory Visit: Payer: Self-pay | Admitting: Family Medicine

## 2017-04-15 ENCOUNTER — Other Ambulatory Visit: Payer: Self-pay

## 2017-04-15 ENCOUNTER — Ambulatory Visit (AMBULATORY_SURGERY_CENTER): Payer: Self-pay | Admitting: *Deleted

## 2017-04-15 VITALS — Ht 61.5 in | Wt 231.2 lb

## 2017-04-15 DIAGNOSIS — Z1211 Encounter for screening for malignant neoplasm of colon: Secondary | ICD-10-CM

## 2017-04-15 NOTE — Progress Notes (Signed)
Denies allergies to eggs or soy products. Denies complications with sedation or anesthesia. Denies O2 use. Denies use of diet or weight loss medications.  Emmi instructions given for colonoscopy.  

## 2017-04-17 ENCOUNTER — Encounter: Payer: Self-pay | Admitting: Internal Medicine

## 2017-04-26 ENCOUNTER — Other Ambulatory Visit: Payer: Self-pay

## 2017-04-26 ENCOUNTER — Encounter: Payer: Self-pay | Admitting: Internal Medicine

## 2017-04-26 ENCOUNTER — Ambulatory Visit (AMBULATORY_SURGERY_CENTER): Payer: 59 | Admitting: Internal Medicine

## 2017-04-26 VITALS — BP 102/56 | HR 54 | Temp 98.5°F | Resp 14 | Ht 61.5 in | Wt 231.0 lb

## 2017-04-26 DIAGNOSIS — Z1211 Encounter for screening for malignant neoplasm of colon: Secondary | ICD-10-CM | POA: Diagnosis present

## 2017-04-26 DIAGNOSIS — Z1212 Encounter for screening for malignant neoplasm of rectum: Secondary | ICD-10-CM

## 2017-04-26 DIAGNOSIS — E119 Type 2 diabetes mellitus without complications: Secondary | ICD-10-CM | POA: Diagnosis not present

## 2017-04-26 MED ORDER — SODIUM CHLORIDE 0.9 % IV SOLN: 500.0000 mL | Freq: Once | INTRAVENOUS | Status: DC

## 2017-04-26 NOTE — Progress Notes (Signed)
To recovery, report to RN, VSS. 

## 2017-04-26 NOTE — Progress Notes (Signed)
Patient consents to observer being present for procedure. Pt's states no medical or surgical changes since previsit or office visit. 

## 2017-04-26 NOTE — Patient Instructions (Addendum)
   No polyps or cancer seen.  You do have a condition called diverticulosis - common and not usually a problem. Please read the handout provided.  I appreciate the opportunity to care for you. Gatha Mayer, MD, FACG   YOU HAD AN ENDOSCOPIC PROCEDURE TODAY AT [facility]:   Refer to the procedure report that was given to you for any specific questions about what was found during the examination.  If the procedure report does not answer your questions, please call your gastroenterologist to clarify.  If you requested that your care partner not be given the details of your procedure findings, then the procedure report has been included in a sealed envelope for you to review at your convenience later.  YOU SHOULD EXPECT: Some feelings of bloating in the abdomen. Passage of more gas than usual.  Walking can help get rid of the air that was put into your GI tract during the procedure and reduce the bloating. If you had a lower endoscopy (such as a colonoscopy or flexible sigmoidoscopy) you may notice spotting of blood in your stool or on the toilet paper. If you underwent a bowel prep for your procedure, you may not have a normal bowel movement for a few days.  Please Note:  You might notice some irritation and congestion in your nose or some drainage.  This is from the oxygen used during your procedure.  There is no need for concern and it should clear up in a day or so.  SYMPTOMS TO REPORT IMMEDIATELY:   Following lower endoscopy (colonoscopy or flexible sigmoidoscopy):  Excessive amounts of blood in the stool  Significant tenderness or worsening of abdominal pains  Swelling of the abdomen that is new, acute  Fever of 100F or higher For urgent or emergent issues, a gastroenterologist can be reached at any hour by calling [phone].   DIET:  We do recommend a small meal at first, but then you may proceed to your regular diet.  Drink plenty of fluids but you should  avoid alcoholic beverages for 24 hours.  ACTIVITY:  You should plan to take it easy for the rest of today and you should NOT DRIVE or use heavy machinery until tomorrow (because of the sedation medicines used during the test).    FOLLOW UP: Our staff will call the number listed on your records the next business day following your procedure to check on you and address any questions or concerns that you may have regarding the information given to you following your procedure. If we do not reach you, we will leave a message.  However, if you are feeling well and you are not experiencing any problems, there is no need to return our call.  We will assume that you have returned to your regular daily activities without incident.  If any biopsies were taken you will be contacted by phone or by letter within the next 1-3 weeks.  Please call us at [phone] if you have not heard about the biopsies in 3 weeks.    SIGNATURES/CONFIDENTIALITY: You and/or your care partner have signed paperwork which will be entered into your electronic medical record.  These signatures attest to the fact that that the information above on your After Visit Summary has been reviewed and is understood.  Full responsibility of the confidentiality of this discharge information lies with you and/or your care-partner.

## 2017-04-26 NOTE — Op Note (Signed)
[facility] Patient Name: Patricia [name] Procedure Date: 04/26/2017 9:13 AM MRN: [id number] Endoscopist: Gatha Mayer , MD Age: 52 Referring MD:  Date of Birth: 1966/03/12 Gender: Female Account #: [id number] Procedure:                Colonoscopy Indications:              Screening for colorectal malignant neoplasm, This                            is the patient's first colonoscopy Medicines:                Propofol per Anesthesia, Monitored Anesthesia Care Procedure:                Pre-Anesthesia Assessment:                           - Prior to the procedure, a History and Physical                            was performed, and patient medications and                            allergies were reviewed. The patient's tolerance of                            previous anesthesia was also reviewed. The risks                            and benefits of the procedure and the sedation                            options and risks were discussed with the patient.                            All questions were answered, and informed consent                            was obtained. Prior Anticoagulants: The patient has                            taken no previous anticoagulant or antiplatelet                            agents. ASA Grade Assessment: III - A patient with                            severe systemic disease. After reviewing the risks                            and benefits, the patient was deemed in                            satisfactory condition to undergo the procedure.  After obtaining informed consent, the colonoscope                            was passed under direct vision. Throughout the                            procedure, the patient's blood pressure, pulse, and                            oxygen saturations were monitored continuously. The                            Model PCF-[id number]) scope was introduced     through the anus and advanced to the the cecum,                            identified by appendiceal orifice and ileocecal                            valve. The colonoscopy was performed without                            difficulty. The patient tolerated the procedure                            well. The quality of the bowel preparation was                            adequate. The appendiceal orifice and the rectum                            were photographed. IC valve omitted                            unintentionally. The bowel preparation used was                            Miralax. Scope In: 9:17:40 AM Scope Out: 9:32:48 AM Scope Withdrawal Time: 0 hours 13 minutes 31 seconds  Total Procedure Duration: 0 hours 15 minutes 8 seconds  Findings:                 The perianal and digital rectal examinations were                            normal.                           Multiple diverticula were found in the sigmoid                            colon.                           The exam was otherwise without abnormality on  direct and retroflexion views. Complications:            No immediate complications. Estimated Blood Loss:     Estimated blood loss: none. Impression:               - Diverticulosis in the sigmoid colon.                           - The examination was otherwise normal on direct                            and retroflexion views.                           - No specimens collected. Recommendation:           - Patient has a contact number available for                            emergencies. The signs and symptoms of potential                            delayed complications were discussed with the                            patient. Return to normal activities tomorrow.                            Written discharge instructions were provided to the                            patient.                           - Resume previous diet.                            - Continue present medications.                           - Repeat colonoscopy in 10 years for screening                            purposes. Gatha Mayer, MD 04/26/2017 9:38:39 AM This report has been signed electronically.

## 2017-05-15 DIAGNOSIS — R300 Dysuria: Secondary | ICD-10-CM | POA: Diagnosis not present

## 2017-05-15 DIAGNOSIS — N898 Other specified noninflammatory disorders of vagina: Secondary | ICD-10-CM | POA: Diagnosis not present

## 2017-05-15 DIAGNOSIS — Z01419 Encounter for gynecological examination (general) (routine) without abnormal findings: Secondary | ICD-10-CM | POA: Diagnosis not present

## 2017-05-15 DIAGNOSIS — Z6841 Body Mass Index (BMI) 40.0 and over, adult: Secondary | ICD-10-CM | POA: Diagnosis not present

## 2017-05-17 ENCOUNTER — Encounter: Payer: 59 | Admitting: Internal Medicine

## 2017-06-04 ENCOUNTER — Encounter: Payer: Self-pay | Admitting: Cardiology

## 2017-06-04 ENCOUNTER — Ambulatory Visit: Payer: 59 | Admitting: Cardiology

## 2017-06-04 VITALS — BP 112/74 | HR 66 | Ht 61.5 in | Wt 232.2 lb

## 2017-06-04 DIAGNOSIS — R002 Palpitations: Secondary | ICD-10-CM | POA: Diagnosis not present

## 2017-06-04 DIAGNOSIS — R0602 Shortness of breath: Secondary | ICD-10-CM | POA: Diagnosis not present

## 2017-06-04 NOTE — Progress Notes (Signed)
Clinical Summary Patricia [name] is a 52 y.o.female seen today for follow up of the following medical probems.   1. Palpitations  - previous 7 day event monitor showed no signficant arrhythmias  - mild palpitations at times. Just occasionally.  - limited caffeine, limited EtoH.   2. OSA -she remains compliant with staitn  3. SOB - reported symptoms of SOB and LE edema at last visit - echo 09/2015 LVEF 88-50%, normal diastolic function.   - breathing improved with weight loss after prior bariatric surgery   4. Severe obesity - s/p bariatric surgery - recent 50 lbs weight loss.   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 Graduated from [employer], active alumnus     Past Medical History:  Diagnosis Date  . Arthritis    "spine-DDD-intermittent sciatic pain"  . Chronic cervicitis    resolved  . Diabetes mellitus without complication (HCC)    diet controlled  . GERD (gastroesophageal reflux disease)   . Heart murmur    "benign"-routinely sees yearly - Dr. Deretha Emory, cardiology  . History of kidney stones    multiple kidney stones-passed.  . Hypertension   . IBS (irritable bowel syndrome)    controlled with diet  . Menorrhagia    resolved s/p Hysterectomy  . Obesity   . Sleep apnea    cpap used nightly     No Known Allergies   Current Outpatient Medications  Medication Sig Dispense Refill  . bisacodyl (DULCOLAX) 5 MG EC tablet Take 5 mg by mouth once. One time use for colonoscopy    . CALCIUM PO Take by mouth.    . fluticasone (FLONASE) 50 MCG/ACT nasal spray PLACE 2 SPRAYS INTO THE NOSE AT BEDTIME. (Patient not taking: Reported on 04/15/2017) 16 g 4  . KLOR-CON M20 20 MEQ tablet TAKE 1 TABLET BY MOUTH EVERY DAY 30 tablet 2  . losartan-hydrochlorothiazide (HYZAAR) 100-12.5 MG tablet TAKE 1 TABLET BY MOUTH EVERY DAY 90 tablet 1  . Multiple Vitamin (MULTIVITAMIN) capsule Take 1 capsule by mouth daily.    Marland Kitchen omeprazole  (PRILOSEC) 40 MG capsule TAKE 1 CAPSULE BY MOUTH ONCE A DAY 90 capsule 3  . predniSONE (DELTASONE) 20 MG tablet 3 tabs poqday 1-2, 2 tabs poqday 3-4, 1 tab poqday 5-6 (Patient taking differently: as needed. 3 tabs poqday 1-2, 2 tabs poqday 3-4, 1 tab poqday 5-6) 12 tablet 0  . Probiotic Product (PROBIOTIC DAILY PO) Take 1 tablet by mouth daily.     Current Facility-Administered Medications  Medication Dose Route Frequency Provider Last Rate Last Dose  . 0.9 %  sodium chloride infusion  500 mL Intravenous Once Gatha Mayer, MD         Past Surgical History:  Procedure Laterality Date  . BREAST LUMPECTOMY Left    benign hamartoma  . CESAREAN SECTION     x1  . COLONOSCOPY  10/19/2005  . ESOPHAGOGASTRODUODENOSCOPY  12/21/2008  . HYSTEROSCOPY W/D&C     resection of endometrial polyp  . LAPAROSCOPIC GASTRIC SLEEVE RESECTION N/A 03/12/2016   Procedure: LAPAROSCOPIC GASTRIC SLEEVE RESECTION WITH UPPER ENDO;  Surgeon: Arta Bruce Kinsinger, MD;  Location: [facility];  Service: General;  Laterality: N/A;  . POLYPECTOMY     uterus  . TUBAL LIGATION    . uterine ablation    . VAGINAL HYSTERECTOMY     fibroids     No Known Allergies    Family History  Problem Relation Age of Onset  .  Hypertension Father   . Hypertension Mother   . Diabetes Mother   . Coronary artery disease Mother   . Cancer Brother        carcinoid tumer of the appendix  . Colon cancer Neg Hx   . Esophageal cancer Neg Hx   . Stomach cancer Neg Hx   . Rectal cancer Neg Hx      Social History [name] reports that  has never smoked. she has never used smokeless tobacco. [name] reports that she drinks alcohol.   Review of Systems CONSTITUTIONAL: No weight loss, fever, chills, weakness or fatigue.  HEENT: Eyes: No visual loss, blurred vision, double vision or yellow sclerae.No hearing loss, sneezing, congestion, runny nose or sore throat.  SKIN: No rash or itching.  CARDIOVASCULAR: per hpi RESPIRATORY: per  hpi GASTROINTESTINAL: No anorexia, nausea, vomiting or diarrhea. No abdominal pain or blood.  GENITOURINARY: No burning on urination, no polyuria NEUROLOGICAL: No headache, dizziness, syncope, paralysis, ataxia, numbness or tingling in the extremities. No change in bowel or bladder control.  MUSCULOSKELETAL: No muscle, back pain, joint pain or stiffness.  LYMPHATICS: No enlarged nodes. No history of splenectomy.  PSYCHIATRIC: No history of depression or anxiety.  ENDOCRINOLOGIC: No reports of sweating, cold or heat intolerance. No polyuria or polydipsia.  Marland Kitchen   Physical Examination Vitals:   06/04/17 0907  BP: 112/74  Pulse: 66  SpO2: 98%   Vitals:   06/04/17 0907  Weight: 232 lb 3.2 oz (105.3 kg)  Height: 5' 1.5" (1.562 m)   Body mass index is 43.16 kg/m.  Gen: resting comfortably, no acute distress HEENT: no scleral icterus, pupils equal round and reactive, no palptable cervical adenopathy,  CV: RRR, no m/r/g, no jvd Resp: Clear to auscultation bilaterally GI: abdomen is soft, non-tender, non-distended, normal bowel sounds, no hepatosplenomegaly MSK: extremities are warm, no edema.  Skin: warm, no rash Neuro:  no focal deficits Psych: appropriate affect   Diagnostic Studies  03/2013 Event Monitor 7 Days No symptoms, normal sinus rhythm  09/2015 echo Study Conclusions  - Left ventricle: The cavity size was normal. Wall thickness was normal. Systolic function was normal. The estimated ejection fraction was in the range of 60% to 65%. Wall motion was normal; there were no regional wall motion abnormalities. Left ventricular diastolic function parameters were normal for the patient&'s age. - Right atrium: Central venous pressure (est): 3 mm Hg. - Atrial septum: No defect or patent foramen ovale was identified. - Tricuspid valve: There was mild regurgitation. - Pulmonary arteries: PA peak pressure: 32 mm Hg (S). - Pericardium, extracardiac: There was no  pericardial effusion.  Impressions:  - Normal LV wall thickness with LVEF 60-65%. Normal diastolic function. Mild tricuspid regurgitation with PASP 32 mmHg.     Assessment and Plan  1. Palpitations  - benign event monitor, mild infrequent symptoms. Continue to monitor at this time - ekg in clinic today shows sinus bradycardia  2. SOB - normal echo. Symptoms continue to improve with weight loss, continue to monitor  3. Severe obesity BMI 43 - counseled on continued diet and exercise  Request labs from pcp   F/u 1year        Arnoldo Lenis, M.D., F.A.C.C.

## 2017-06-04 NOTE — Patient Instructions (Signed)
Medication Instructions:  Your physician recommends that you continue on your current medications as directed. Please refer to the Current Medication list given to you today.   Labwork: I WILL REQUEST LABS FROM PCP   NONE  Follow-Up: Your physician wants you to follow-up in: 1 YEAR .You will receive a reminder letter in the mail two months in advance. If you don't receive a letter, please call our office to schedule the follow-up appointment.   Any Other Special Instructions Will Be Listed Below (If Applicable).     If you need a refill on your cardiac medications before your next appointment, please call your pharmacy.

## 2017-06-07 ENCOUNTER — Encounter: Payer: Self-pay | Admitting: Cardiology

## 2017-06-28 DIAGNOSIS — Z0100 Encounter for examination of eyes and vision without abnormal findings: Secondary | ICD-10-CM | POA: Diagnosis not present

## 2017-07-01 ENCOUNTER — Ambulatory Visit: Payer: 59 | Admitting: Physician Assistant

## 2017-07-01 ENCOUNTER — Encounter: Payer: Self-pay | Admitting: Physician Assistant

## 2017-07-01 VITALS — BP 118/72 | HR 79 | Temp 98.0°F | Resp 16 | Ht 61.0 in | Wt 233.2 lb

## 2017-07-01 DIAGNOSIS — A084 Viral intestinal infection, unspecified: Secondary | ICD-10-CM

## 2017-07-01 DIAGNOSIS — R52 Pain, unspecified: Secondary | ICD-10-CM | POA: Diagnosis not present

## 2017-07-01 LAB — INFLUENZA A AND B AG, IMMUNOASSAY
INFLUENZA A ANTIGEN: NOT DETECTED
INFLUENZA B ANTIGEN: NOT DETECTED

## 2017-07-01 NOTE — Progress Notes (Signed)
    Patient ID: Patricia [name] MRN: [id number], DOB: Dec 30, 1965, 52 y.o. Date of Encounter: 07/01/2017, 2:42 PM    Chief Complaint:  Chief Complaint  Patient presents with  . Headache    started saturday   . Diarrhea  . Fatigue     HPI: 52 y.o. year old female presents with above.   On Saturday, 06/29/2017 she had been cleaning her mother-in-law's house. On Saturday night she started feeling sick and felt sick all day yesterday.  Was having headache, fatigue, diarrhea.  Had multiple episodes of diarrhea "all day yesterday ".  Yesterday drank Gatorade and ate no solid food.  Had some abdominal cramping yesterday and felt exhausted.  Has had no vomiting.  Says that she really has not felt nauseous --just has felt no appetite.  No localized/focal areas of abdominal pain.  No known fever.  No known sick contacts with similar symptoms.  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     Home Meds:   Outpatient Medications Prior to Visit  Medication Sig Dispense Refill  . CALCIUM PO Take by mouth.    Marland Kitchen KLOR-CON M20 20 MEQ tablet TAKE 1 TABLET BY MOUTH EVERY DAY 30 tablet 2  . losartan-hydrochlorothiazide (HYZAAR) 100-12.5 MG tablet TAKE 1 TABLET BY MOUTH EVERY DAY 90 tablet 1  . Multiple Vitamin (MULTIVITAMIN) capsule Take 1 capsule by mouth daily.    Marland Kitchen omeprazole (PRILOSEC) 40 MG capsule TAKE 1 CAPSULE BY MOUTH ONCE A DAY 90 capsule 3  . Probiotic Product (PROBIOTIC DAILY PO) Take 1 tablet by mouth daily.     Facility-Administered Medications Prior to Visit  Medication Dose Route Frequency Provider Last Rate Last Dose  . 0.9 %  sodium chloride infusion  500 mL Intravenous Once Gatha Mayer, MD        Allergies: No Known Allergies    Review of Systems: See HPI for pertinent ROS. All other ROS negative.    Physical Exam: Blood pressure 118/72, pulse 79, temperature 98 F (36.7 C), temperature source Oral, resp. rate 16, height 5\' 1"  (1.549  m), weight 105.8 kg (233 lb 3.2 oz), SpO2 97 %., Body mass index is 44.06 kg/m. General: AAF.  Appears in no acute distress. Neck: Supple. No thyromegaly. No lymphadenopathy. Lungs: Clear bilaterally to auscultation without wheezes, rales, or rhonchi. Breathing is unlabored. Heart: Regular rhythm. No murmurs, rubs, or gallops. Abdomen: Soft, non-tender, non-distended with normoactive bowel sounds. No hepatomegaly. No rebound/guarding. No obvious abdominal masses.  No area of tenderness with palpation of abdomen. Msk:  Strength and tone normal for age. Extremities/Skin: Warm and dry.  Neuro: Alert and oriented X 3. Moves all extremities spontaneously. Gait is normal. CNII-XII grossly in tact. Psych:  Responds to questions appropriately with a normal affect.     ASSESSMENT AND PLAN:  52 y.o. year old female with   1. Viral gastroenteritis Influenza test is negative.  Discussed with her that at this point this is consistent with a viral stomach virus.  Recommend that she stay with clear liquids and avoid solid foods until diarrhea has resolved.  Follow-up if symptoms worsen or develops localized/focal abdominal pain or fever or if symptoms not resolving in 48 hours.  2. Generalized body aches - Influenza A and B Ag, Immunoassay   Signed, [address] [place], [place], [facility] 07/01/2017 2:42 PM

## 2017-07-04 DIAGNOSIS — G4733 Obstructive sleep apnea (adult) (pediatric): Secondary | ICD-10-CM | POA: Diagnosis not present

## 2017-07-14 IMAGING — CT CT HAND*R* W/O CM
3 of 4 series · 9 of 35 positions shown, 11 images · non-contrast
Comparison: None.

CLINICAL DATA: Right thumb pain for 2 months. Infection versus
arthritis. No specific injury.

EXAM:
CT OF THE RIGHT HAND WITHOUT CONTRAST
TECHNIQUE: Multidetector CT imaging of the right hand was performed according
to the standard protocol. Multiplanar CT image reconstructions were
also generated.

[Series 6: thin soft · axial · 0.23mm/px · z∈[+153,+153]mm · 1 of 252 slices shown, 2 images]
[im 126/252  soft-tissue]
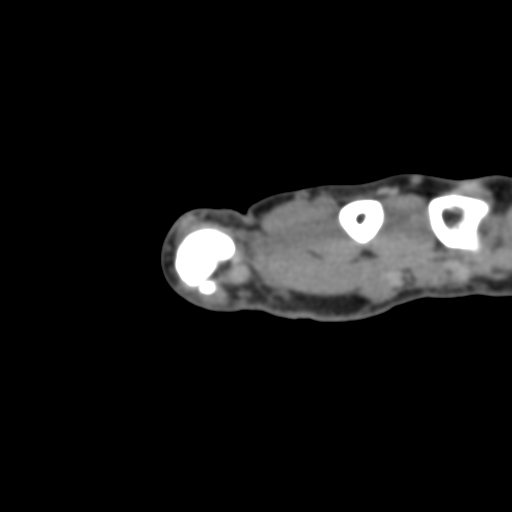
[im 126/252  bone]
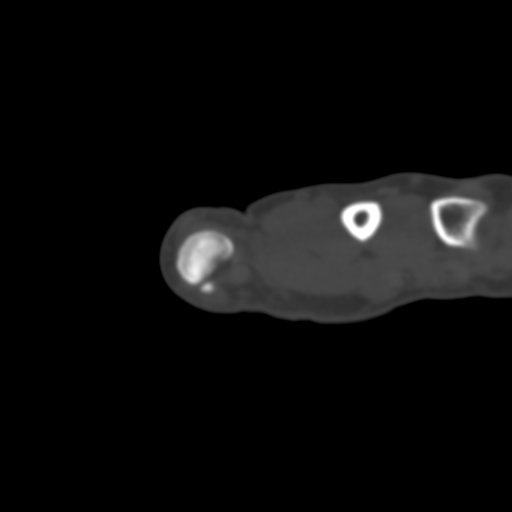

[Series 9: sag soft · sagittal · 0.15mm/px · 5 of 79 slices shown, 6 images]
[im 27/79  bone]
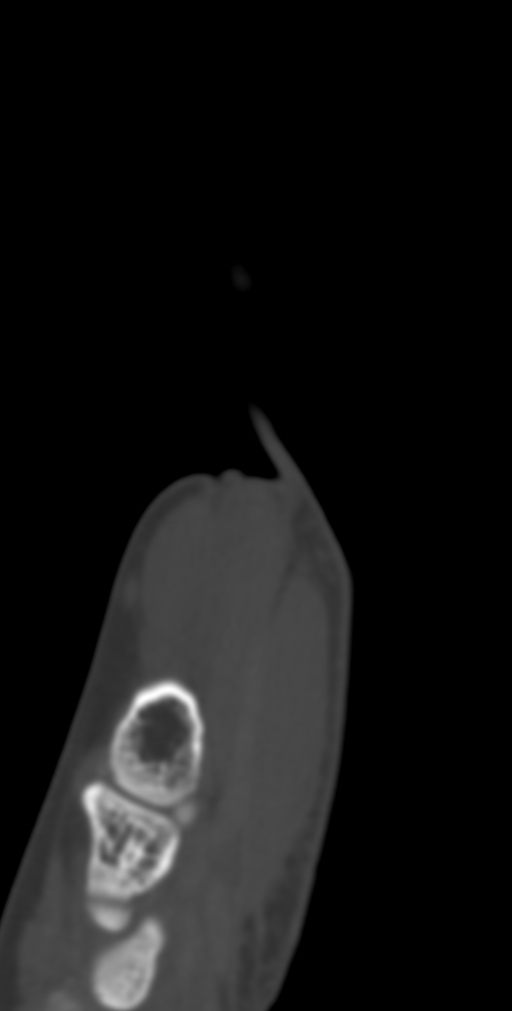
[im 33/79  bone]
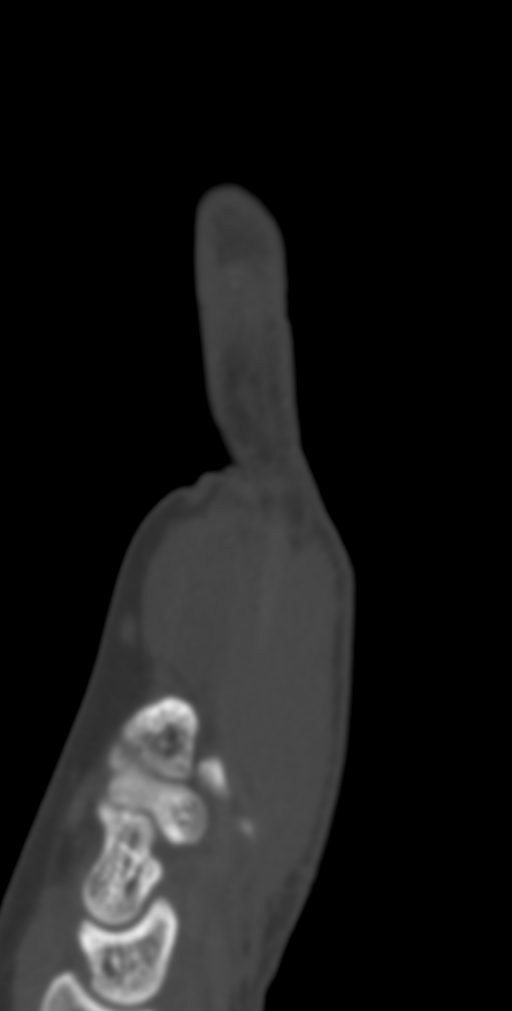
[im 40/79  soft-tissue]
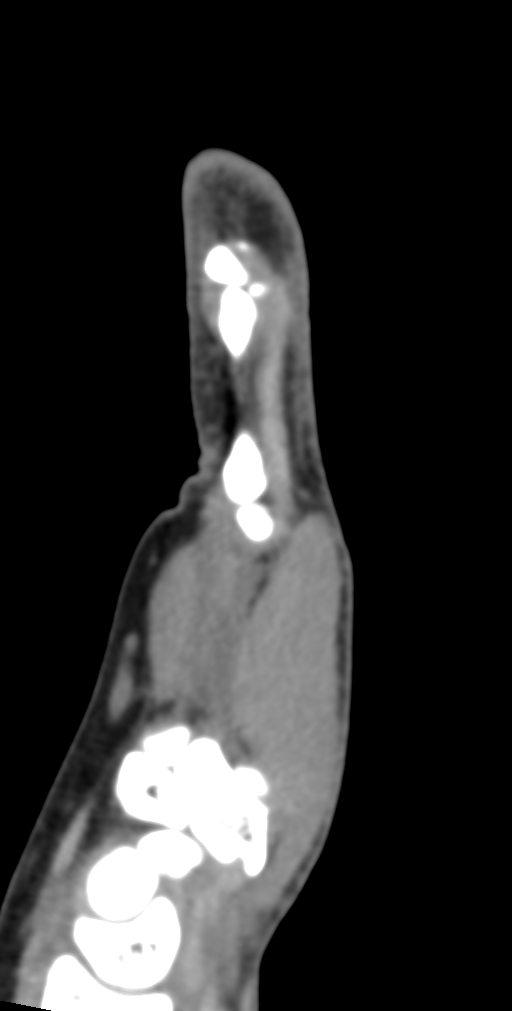
[im 40/79  bone]
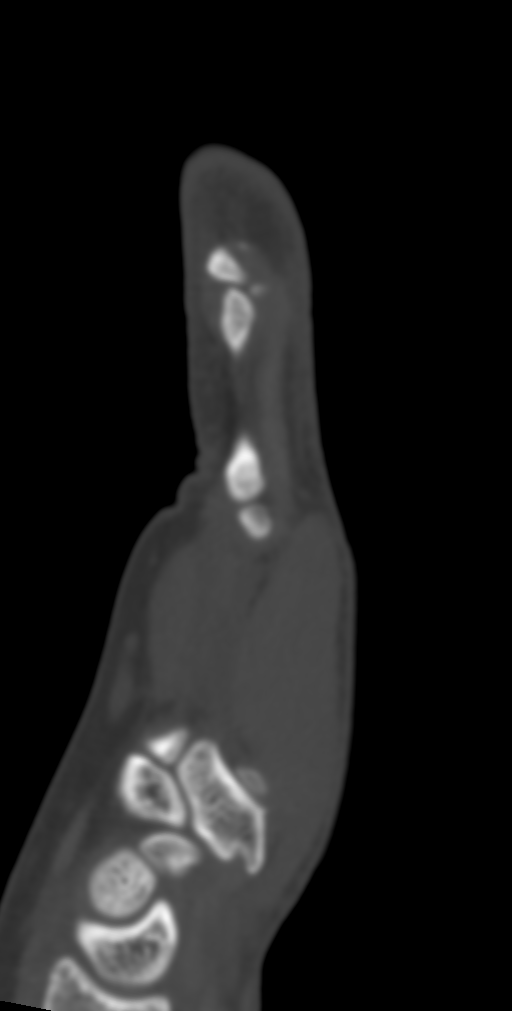
[im 46/79  bone]
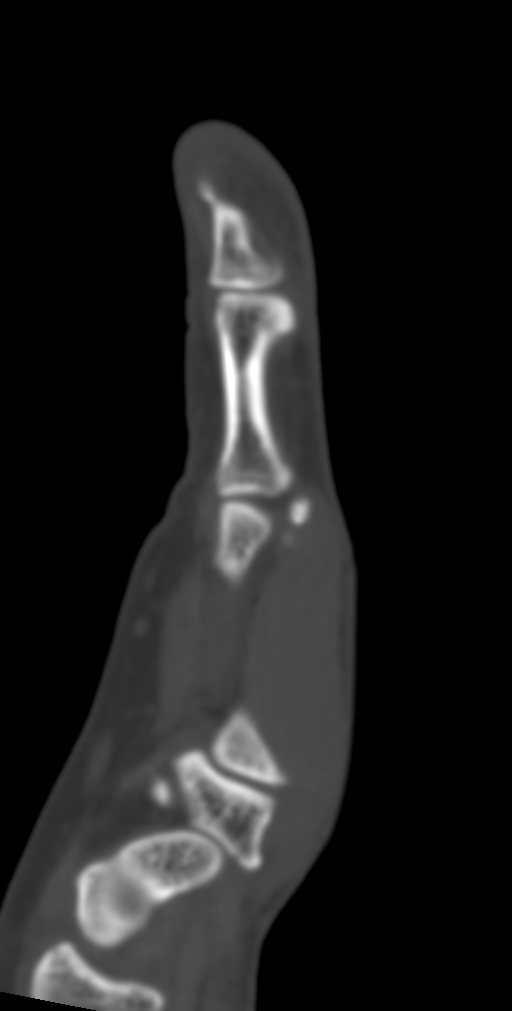
[im 53/79  bone]
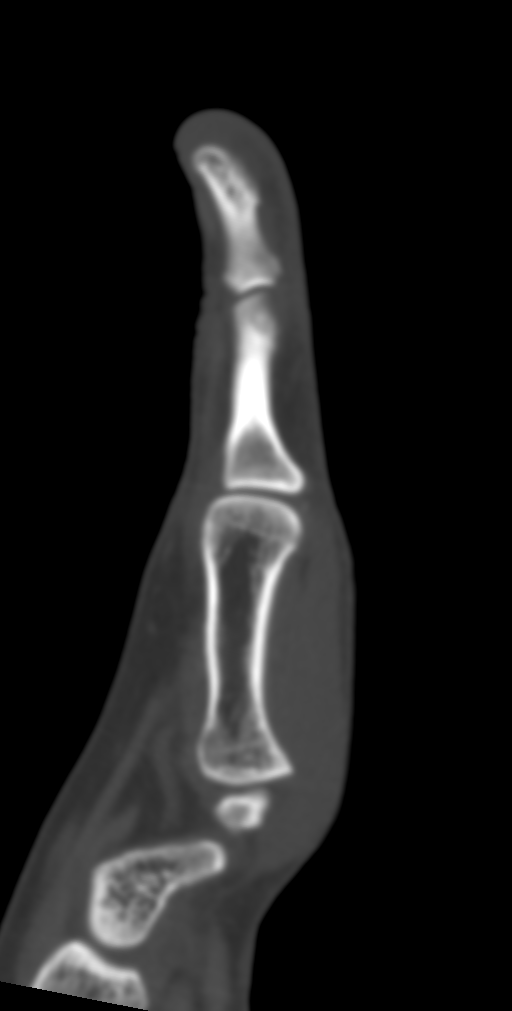

[Series 10: cor soft · coronal · 0.21mm/px · 3 of 131 slices shown]
[im 27/131  bone]
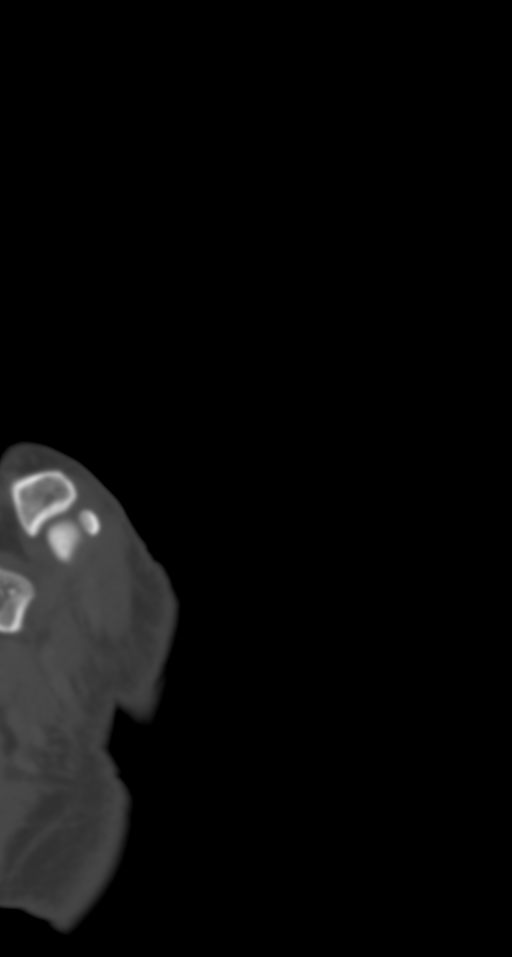
[im 53/131  bone]
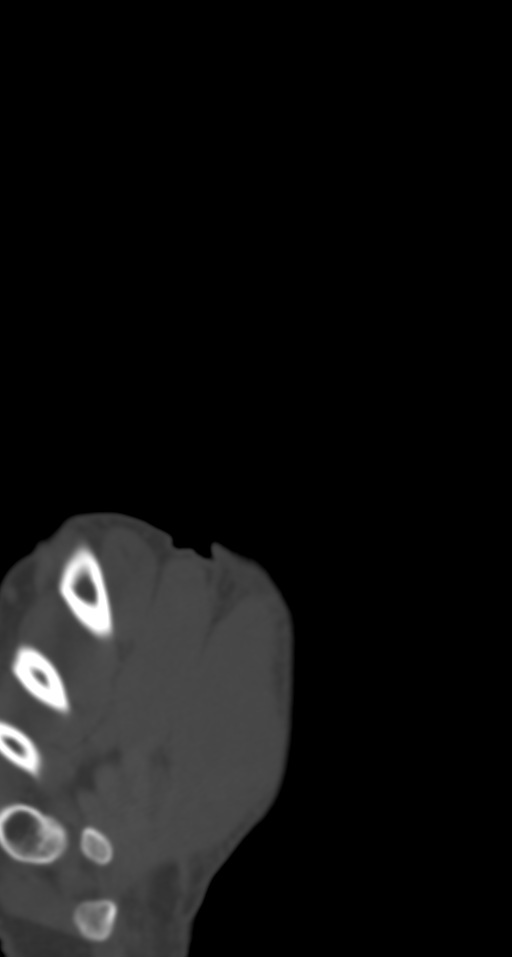
[im 79/131  bone]
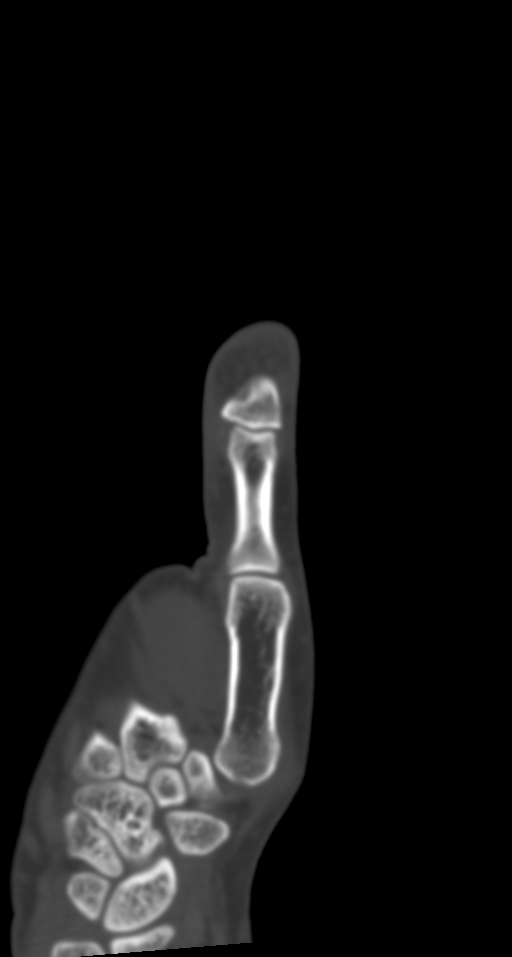

[9 of 35 positions shown; findings below may reference images not displayed]

FINDINGS: The visualized intercarpal joint spaces are maintained. No
significant bony findings. There are mild degenerative changes at
the carpometacarpal joint of the thumb with a small dystrophic
calcification noted along the palmar aspect of the joint. The
metacarpophalangeal joint is maintained. No degenerative changes or
erosive findings. No joint effusion.

There are mild degenerative changes at the interphalangeal joint
with small scattered calcifications along the palmar aspect along
with probable calcification of the ulnar collateral ligament. The
largest calcification along the palmar aspect is most likely normal
variant sesamoid bone. This is most likely remote posttraumatic
change. I do not see any erosive findings or destructive changes to
suggest septic arthritis or an inflammatory arthropathy. The flexor
and extensor tendons appear intact.
IMPRESSION: Mild degenerative changes noted at the carpometacarpal joint of the
thumb and also at the interphalangeal joint of the thumb. No erosive
changes.

Dystrophic calcification noted near the CMC joint.

Probable remote posttraumatic changes involving the interphalangeal
joint with tiny calcifications and probable calcification of the
ulnar collateral ligament.

No findings for erosive arthropathy or septic arthritis or
osteomyelitis.

## 2017-08-01 ENCOUNTER — Ambulatory Visit: Payer: 59 | Admitting: Family Medicine

## 2017-08-01 ENCOUNTER — Ambulatory Visit (INDEPENDENT_AMBULATORY_CARE_PROVIDER_SITE_OTHER): Payer: 59 | Admitting: Family Medicine

## 2017-08-01 ENCOUNTER — Encounter: Payer: Self-pay | Admitting: Family Medicine

## 2017-08-01 VITALS — BP 124/78 | HR 64 | Temp 98.1°F | Resp 18 | Ht 61.5 in | Wt 234.0 lb

## 2017-08-01 DIAGNOSIS — Z23 Encounter for immunization: Secondary | ICD-10-CM

## 2017-08-01 DIAGNOSIS — R7303 Prediabetes: Secondary | ICD-10-CM | POA: Diagnosis not present

## 2017-08-01 DIAGNOSIS — Z0000 Encounter for general adult medical examination without abnormal findings: Secondary | ICD-10-CM | POA: Diagnosis not present

## 2017-08-01 DIAGNOSIS — G4733 Obstructive sleep apnea (adult) (pediatric): Secondary | ICD-10-CM | POA: Diagnosis not present

## 2017-08-01 DIAGNOSIS — I10 Essential (primary) hypertension: Secondary | ICD-10-CM

## 2017-08-01 DIAGNOSIS — E66813 Obesity, class 3: Secondary | ICD-10-CM

## 2017-08-01 NOTE — Addendum Note (Signed)
Addended by: Shary Decamp B on: 08/01/2017 12:24 PM   Modules accepted: Orders

## 2017-08-01 NOTE — Progress Notes (Signed)
Subjective:    Patient ID: Patricia [name], female    DOB: [birth year], 52 y.o.   MRN: [id number]  HPI  Patient is a very pleasant 52 year old African-American female who is here today for a physical.  She sees her gynecologist in January and has already had her Pap smear which was reportedly normal.  Her mammogram was performed in November and is normal.  She had a colonoscopy in February of this year and it was normal.  She is due for a tetanus shot.  The remainder of her vaccinations are up-to-date.  She is also due for HIV screening however she politely declines this.  She had this screened at an outside clinic.  Otherwise she has no concerns Past Medical History:  Diagnosis Date  . Arthritis    "spine-DDD-intermittent sciatic pain"  . Chronic cervicitis    resolved  . Diabetes mellitus without complication (HCC)    diet controlled  . GERD (gastroesophageal reflux disease)   . Heart murmur    "benign"-routinely sees yearly - Dr. Deretha Emory, cardiology  . History of kidney stones    multiple kidney stones-passed.  . Hypertension   . IBS (irritable bowel syndrome)    controlled with diet  . Menorrhagia    resolved s/p Hysterectomy  . Obesity   . Sleep apnea    cpap used nightly   Past Surgical History:  Procedure Laterality Date  . BREAST LUMPECTOMY Left    benign hamartoma  . CESAREAN SECTION     x1  . COLONOSCOPY  10/19/2005  . ESOPHAGOGASTRODUODENOSCOPY  12/21/2008  . HYSTEROSCOPY W/D&C     resection of endometrial polyp  . LAPAROSCOPIC GASTRIC SLEEVE RESECTION N/A 03/12/2016   Procedure: LAPAROSCOPIC GASTRIC SLEEVE RESECTION WITH UPPER ENDO;  Surgeon: Arta Bruce Kinsinger, MD;  Location: [facility];  Service: General;  Laterality: N/A;  . POLYPECTOMY     uterus  . TUBAL LIGATION    . uterine ablation    . VAGINAL HYSTERECTOMY     fibroids   Current Outpatient Medications on File Prior to Visit  Medication Sig Dispense Refill  . CALCIUM PO Take by mouth.    Marland Kitchen  KLOR-CON M20 20 MEQ tablet TAKE 1 TABLET BY MOUTH EVERY DAY 30 tablet 2  . losartan-hydrochlorothiazide (HYZAAR) 100-12.5 MG tablet TAKE 1 TABLET BY MOUTH EVERY DAY 90 tablet 1  . Multiple Vitamin (MULTIVITAMIN) capsule Take 1 capsule by mouth daily.    Marland Kitchen omeprazole (PRILOSEC) 40 MG capsule TAKE 1 CAPSULE BY MOUTH ONCE A DAY 90 capsule 3  . Probiotic Product (PROBIOTIC DAILY PO) Take 1 tablet by mouth daily.     Current Facility-Administered Medications on File Prior to Visit  Medication Dose Route Frequency Provider Last Rate Last Dose  . 0.9 %  sodium chloride infusion  500 mL Intravenous Once Gatha Mayer, MD       No Known Allergies Social History   Socioeconomic History  . Marital status: Married    Spouse name: Montine Circle  . Number of children: 2  . Years of education: 45  . Highest education level: Not on file  Occupational History  . Occupation: QUALITY ANALYST    Employer: [employer]  . Financial resource strain: Not on file  . Food insecurity:    Worry: Not on file    Inability: Not on file  . Transportation needs:    Medical: Not on file    Non-medical: Not on file  Tobacco Use  .  Smoking status: Never Smoker  . Smokeless tobacco: Never Used  Substance and Sexual Activity  . Alcohol use: Yes    Alcohol/week: 0.0 oz    Comment: socially  . Drug use: No  . Sexual activity: Yes    Birth control/protection: Surgical  Lifestyle  . Physical activity:    Days per week: Not on file    Minutes per session: Not on file  . Stress: Not on file  Relationships  . Social connections:    Talks on phone: Not on file    Gets together: Not on file    Attends religious service: Not on file    Active member of club or organization: Not on file    Attends meetings of clubs or organizations: Not on file    Relationship status: Not on file  . Intimate partner violence:    Fear of current or ex partner: Not on file    Emotionally abused: Not on file     Physically abused: Not on file    Forced sexual activity: Not on file  Other Topics Concern  . Not on file  Social History Narrative   Patient is right handed and consumes green tea daily   Family History  Problem Relation Age of Onset  . Hypertension Father   . Hypertension Mother   . Diabetes Mother   . Coronary artery disease Mother   . Cancer Brother        carcinoid tumer of the appendix  . Colon cancer Neg Hx   . Esophageal cancer Neg Hx   . Stomach cancer Neg Hx   . Rectal cancer Neg Hx       Review of Systems  All other systems reviewed and are negative.      Objective:   Physical Exam  Constitutional: She is oriented to person, place, and time. She appears well-developed and well-nourished. No distress.  HENT:  Head: Normocephalic and atraumatic.  Right Ear: External ear normal.  Left Ear: External ear normal.  Nose: Nose normal.  Mouth/Throat: Oropharynx is clear and moist. No oropharyngeal exudate.  Eyes: Pupils are equal, round, and reactive to light. Conjunctivae and EOM are normal. Right eye exhibits no discharge. Left eye exhibits no discharge. No scleral icterus.  Neck: Normal range of motion. Neck supple. No JVD present. No tracheal deviation present. No thyromegaly present.  Cardiovascular: Normal rate, regular rhythm, normal heart sounds and intact distal pulses. Exam reveals no gallop and no friction rub.  No murmur heard. Pulmonary/Chest: Effort normal and breath sounds normal. No stridor. No respiratory distress. She has no wheezes. She has no rales. She exhibits no tenderness.  Abdominal: Soft. Bowel sounds are normal. She exhibits no distension and no mass. There is no tenderness. There is no rebound and no guarding. No hernia.  Musculoskeletal: She exhibits no edema.  Lymphadenopathy:    She has no cervical adenopathy.  Neurological: She is alert and oriented to person, place, and time. She displays normal reflexes. No cranial nerve deficit or  sensory deficit. She exhibits normal muscle tone. Coordination normal.  Skin: Skin is warm. No rash noted. She is not diaphoretic. No erythema. No pallor.  Psychiatric: She has a normal mood and affect. Her behavior is normal. Thought content normal.  Vitals reviewed.         Assessment & Plan:  General medical exam  Benign essential HTN - Plan: CBC with Differential/Platelet, COMPLETE METABOLIC PANEL WITH GFR, Lipid panel  Obesity, Class III, BMI  40-49.9 (morbid obesity) ([place]) - Plan: CBC with Differential/Platelet, COMPLETE METABOLIC PANEL WITH GFR, Lipid panel  Obstructive sleep apnea (adult) (pediatric)  Prediabetes - Plan: Hemoglobin A1c  Physical exam today is completely normal aside from her elevated BMI.  Since last year the patient has lost 9 pounds.  She is trying to exercise and she is working hard to lose weight.  I am proud of these efforts she is making.  Her blood pressure is well controlled today at 124/78.  I will check a CBC, CMP, and fasting lipid panel.  The last time we check lab work, the patient was a prediabetic with a fasting blood sugar of 120.  Therefore I will also check a hemoglobin A1c.  Immunizations are up-to-date but the patient would like to receive the tetanus shot today.  She declines HIV screening.  The remainder of her cancer screening is up-to-date.  We also spent time discussing her husband.  He has a chronic cough as well as poorly controlled hypertension, diabetes, and hyperlipidemia.  I strongly recommended that he have a follow-up appointment to that we could discuss these issues.  I am concerned that his medical risk are not being well controlled

## 2017-08-02 ENCOUNTER — Encounter (INDEPENDENT_AMBULATORY_CARE_PROVIDER_SITE_OTHER): Payer: Self-pay

## 2017-08-02 LAB — COMPLETE METABOLIC PANEL WITH GFR
AG RATIO: 1.6 (calc) (ref 1.0–2.5)
ALBUMIN MSPROF: 4.1 g/dL (ref 3.6–5.1)
ALT: 11 U/L (ref 6–29)
AST: 18 U/L (ref 10–35)
Alkaline phosphatase (APISO): 85 U/L (ref 33–130)
BILIRUBIN TOTAL: 0.4 mg/dL (ref 0.2–1.2)
BUN: 12 mg/dL (ref 7–25)
CALCIUM: 9.8 mg/dL (ref 8.6–10.4)
CHLORIDE: 103 mmol/L (ref 98–110)
CO2: 30 mmol/L (ref 20–32)
Creat: 0.62 mg/dL (ref 0.50–1.05)
GFR, EST AFRICAN AMERICAN: 122 mL/min/{1.73_m2} (ref >=60)
GFR, EST NON AFRICAN AMERICAN: 105 mL/min/{1.73_m2} (ref >=60)
GLOBULIN: 2.6 g/dL (ref 1.9–3.7)
Glucose, Bld: 88 mg/dL (ref 65–99)
POTASSIUM: 4.2 mmol/L (ref 3.5–5.3)
SODIUM: 140 mmol/L (ref 135–146)
TOTAL PROTEIN: 6.7 g/dL (ref 6.1–8.1)

## 2017-08-02 LAB — CBC WITH DIFFERENTIAL/PLATELET
Basophils Absolute: 30 cells/uL (ref 0–200)
Basophils Relative: 0.7 %
Eosinophils Absolute: 69 cells/uL (ref 15–500)
Eosinophils Relative: 1.6 %
HEMATOCRIT: 40.2 % (ref 35.0–45.0)
Hemoglobin: 13.6 g/dL (ref 11.7–15.5)
LYMPHS ABS: 1656 {cells}/uL (ref 850–3900)
MCH: 29 pg (ref 27.0–33.0)
MCHC: 33.8 g/dL (ref 32.0–36.0)
MCV: 85.7 fL (ref 80.0–100.0)
MPV: 10.3 fL (ref 7.5–12.5)
Monocytes Relative: 7.5 %
Neutro Abs: 2223 cells/uL (ref 1500–7800)
Neutrophils Relative %: 51.7 %
PLATELETS: 254 10*3/uL (ref 140–400)
RBC: 4.69 10*6/uL (ref 3.80–5.10)
RDW: 12.8 % (ref 11.0–15.0)
TOTAL LYMPHOCYTE: 38.5 %
WBC: 4.3 10*3/uL (ref 3.8–10.8)
WBCMIX: 323 {cells}/uL (ref 200–950)

## 2017-08-02 LAB — HEMOGLOBIN A1C
Hgb A1c MFr Bld: 5.5 % of total Hgb (ref <5.7)
Mean Plasma Glucose: 111 (calc)
eAG (mmol/L): 6.2 (calc)

## 2017-08-02 LAB — LIPID PANEL
Cholesterol: 205 mg/dL — ABNORMAL HIGH (ref <200)
HDL: 69 mg/dL (ref >50)
LDL Cholesterol (Calc): 115 mg/dL (calc) — ABNORMAL HIGH
NON-HDL CHOLESTEROL (CALC): 136 mg/dL — AB (ref <130)
TRIGLYCERIDES: 100 mg/dL (ref <150)
Total CHOL/HDL Ratio: 3 (calc) (ref <5.0)

## 2017-08-14 ENCOUNTER — Encounter: Payer: Self-pay | Admitting: Neurology

## 2017-08-14 ENCOUNTER — Telehealth: Payer: Self-pay

## 2017-08-14 NOTE — Telephone Encounter (Signed)
I called pt, no answer, left a detailed message on her cell voicemail, per DPR, reminding her to bring her cpap chip to the appt tomorrow and to call back with questions or concerns.

## 2017-08-15 ENCOUNTER — Ambulatory Visit: Payer: 59 | Admitting: Neurology

## 2017-08-15 ENCOUNTER — Encounter: Payer: Self-pay | Admitting: Neurology

## 2017-08-15 VITALS — BP 100/70 | HR 60 | Ht 61.5 in | Wt 235.0 lb

## 2017-08-15 DIAGNOSIS — G4733 Obstructive sleep apnea (adult) (pediatric): Secondary | ICD-10-CM

## 2017-08-15 DIAGNOSIS — Z9989 Dependence on other enabling machines and devices: Secondary | ICD-10-CM | POA: Diagnosis not present

## 2017-08-15 NOTE — Patient Instructions (Signed)
You have continued to do well on CPAP, please be sure to change your filter every 1-2 months, your mask insert about every 3 months, hose about every 6 months, humidifier chamber about yearly. Some restrictions are imposed by her insurance carrier with regard to how frequently you can get certain supplies.  Please call [facility] for details on your bill.  Talk to PCP about seeing medical weight loss clinic, for example [facility].

## 2017-08-15 NOTE — Progress Notes (Signed)
Subjective:    Patient ID: Patricia [name] is a 52 y.o. female.  HPI     Interim history:   Patricia [name] is a very pleasant 52 year old right-handed woman with an underlying medical history of irritable bowel syndrome, morbid obesity, hypertension, reflux disease as well as kidney stones, who presents for follow-up consultation of her obstructive sleep apnea, well-established on treatment with CPAP. She is unaccompanied today. I last saw her on 08/13/2016, at which time she was doing well with CPAP. She requested a travel CPAP machine. She had done well after her weight loss surgery in December 2017.   Today, 08/15/2017: I reviewed her CPAP compliance data from 07/16/2017 through 08/14/2017 which is a total of 30 days, during which time she used her CPAP 29 days with percent used days greater than 4 hours at 93%, indicating excellent compliance with an average usage of 6 hours and 31 minutes, residual AHI at goal at 0.7 per hour, leak low with the 95th percentile at 1.5 L/m on a pressure of 8 cm with EPR of 3. She reports doing well. Her weight has been fairly stable, current weight is about 5 pounds higher than last year around this time. She reports that she noticed that her weight loss has made it sound. She does exercise on a regular basis.   The patient's allergies, current medications, family history, past medical history, past social history, past surgical history and problem list were reviewed and updated as appropriate.    Previously (copied from previous notes for reference):   I saw her on 08/03/2015, at which time she was doing well, she was compliant with CPAP therapy. She reported ongoing good results with CPAP.   Of note, in the interim, she had gastric sleeve bariatric surgery on 03/12/2016.   I reviewed her CPAP compliance data from 07/14/2016 through 08/12/2016 which is a total of 30 days, during which time she used her CPAP every night with percent used days greater than 4 hours  at 100%, indicating superb compliance with an average usage of 6 hours and 35 minutes, residual AHI 0.6 per hour, leak on the low side for the 95th percentile at 6.4 L/m on a pressure of 8 cm with EPR of 3.    Of note, she canceled an appointment for 01/31/2015. I saw her on 01/25/2014, at which time she reported doing well with CPAP. She was compliant with treatment. She had resolution of her morning headaches and better daytime energy. She was working on weight loss. I suggested a one-year checkup.    I reviewed her CPAP compliance data from 07/04/2015 through 08/02/2015 which is a total of 30 days during which time she used her machine every night with percent used days greater than 4 hours at 100%, indicating superb compliance with an average usage of 6 hours and 44 minutes, residual AHI low at 0.4 per hour, leak low with the 95th percentile at 7.5 L/m on a pressure of 8 cm with EPR of 3.   Of note she did not show for an appointment on 05/18/2013 as well as 09/29/2013. I saw her on 11/14/2012, at which time we discussed her baseline sleep study as well as CPAP titration study findings as well as her initial compliance data. She reported good results with CPAP and resolution of her morning headaches, but had ongoing issues with nasal congestion, rhinorrhea and postnasal drip. I prescribed Flonase for nighttime use as well as requested that she start using nasal saline rinses  during the day. I reviewed her compliance data at the time and congratulated her on her good compliance. We considered referral to an allergy specialist. I reviewed the patient's CPAP compliance data from 11/30/2012 to 12/29/2012, which is a total of 30 days, during which time the patient used CPAP every day. The average usage for all days was 5 hours and 51 minutes. The percent used days greater than 4 hours was 90 %, indicating excellent compliance. The residual AHI was 0.5 per hour, indicating a very appropriate treatment pressure  of 8 cwp with EPR of 3.   I reviewed her compliance data from 12/26/2013 through 01/24/2014 which is a total of 30 days during which time she was under percent compliant, average usage of 6 hours and 40 minutes, residual AHI low at 0.5 per hour, leak low at 4.2 L/m for the 95th percentile, pressure at 8 cm with EPR of 3.   I first met her on 08/01/2012, at which time she reported nonrestorative sleep, morning headaches, daytime somnolence as well as snoring. I asked her to come back for sleep study and she had a baseline sleep test on 08/12/2012: She had an increased percentage of stage II sleep. Sleep efficiency was reduced at 76%. She had mild to moderate snoring. She had a total of 9 obstructive apneas and 49 obstructive hypopneas, resulting in an AHI of 10.4 per hour, markedly worse in REM sleep with 43.9 events per hour. Baseline oxygen saturation was 94% and her nadir was 77% in REM sleep. Based on the degree of desaturation I asked her to come back for a CPAP titration study. She had that test on 09/08/2012: Sleep efficiency was normal at 92.9%. She had a normal percentage of stage I and 2 sleep, 12.3 percent of deep sleep and an increased percentage of REM sleep at 35.2% with a reduced REM latency of 54 minutes. She had mild pruritic leg movements at 7.9 per hour but no associated arousals from this. She was titrated on CPAP using a nasal pillows mask from 5-8 cm and had a residual AHI of 0 per hour on 8 cm of pressure. Her baseline oxygen saturation was 96%, her nadir was 88% with less than 1 minute below the saturation of 90% for the night. Based on those test results I prescribed CPAP for her. I reviewed compliance data from 10/03/2012 through 11/09/2012 (38 days), during which time she used it every day except for one day. Percent used days greater than 4 hours was 82% indicating good compliance. Her average usage was 5 hours and 3 minutes for all days and her residual AHI was 0.9 per hour indicating  an adequate treatment pressure of 8 cm of water with EPR of 3.   Her Past Medical History Is Significant For: Past Medical History:  Diagnosis Date  . Arthritis    "spine-DDD-intermittent sciatic pain"  . Chronic cervicitis    resolved  . Diabetes mellitus without complication (HCC)    diet controlled  . GERD (gastroesophageal reflux disease)   . Heart murmur    "benign"-routinely sees yearly - Dr. Deretha Emory, cardiology  . History of kidney stones    multiple kidney stones-passed.  . Hypertension   . IBS (irritable bowel syndrome)    controlled with diet  . Menorrhagia    resolved s/p Hysterectomy  . Obesity   . Sleep apnea    cpap used nightly    Her Past Surgical History Is Significant For: Past Surgical History:  Procedure Laterality Date  . BREAST LUMPECTOMY Left    benign hamartoma  . CESAREAN SECTION     x1  . COLONOSCOPY  10/19/2005  . ESOPHAGOGASTRODUODENOSCOPY  12/21/2008  . HYSTEROSCOPY W/D&C     resection of endometrial polyp  . LAPAROSCOPIC GASTRIC SLEEVE RESECTION N/A 03/12/2016   Procedure: LAPAROSCOPIC GASTRIC SLEEVE RESECTION WITH UPPER ENDO;  Surgeon: Arta Bruce Kinsinger, MD;  Location: [facility];  Service: General;  Laterality: N/A;  . POLYPECTOMY     uterus  . TUBAL LIGATION    . uterine ablation    . VAGINAL HYSTERECTOMY     fibroids    Her Family History Is Significant For: Family History  Problem Relation Age of Onset  . Hypertension Father   . Hypertension Mother   . Diabetes Mother   . Coronary artery disease Mother   . Cancer Brother        carcinoid tumer of the appendix  . Colon cancer Neg Hx   . Esophageal cancer Neg Hx   . Stomach cancer Neg Hx   . Rectal cancer Neg Hx     Her Social History Is Significant For: Social History   Socioeconomic History  . Marital status: Married    Spouse name: Montine Circle  . Number of children: 2  . Years of education: 30  . Highest education level: Not on file  Occupational History  .  Occupation: QUALITY ANALYST    Employer: [employer]  . Financial resource strain: Not on file  . Food insecurity:    Worry: Not on file    Inability: Not on file  . Transportation needs:    Medical: Not on file    Non-medical: Not on file  Tobacco Use  . Smoking status: Never Smoker  . Smokeless tobacco: Never Used  Substance and Sexual Activity  . Alcohol use: Yes    Alcohol/week: 0.0 oz    Comment: socially  . Drug use: No  . Sexual activity: Yes    Birth control/protection: Surgical  Lifestyle  . Physical activity:    Days per week: Not on file    Minutes per session: Not on file  . Stress: Not on file  Relationships  . Social connections:    Talks on phone: Not on file    Gets together: Not on file    Attends religious service: Not on file    Active member of club or organization: Not on file    Attends meetings of clubs or organizations: Not on file    Relationship status: Not on file  Other Topics Concern  . Not on file  Social History Narrative   Patient is right handed and consumes green tea daily    Her Allergies Are:  No Known Allergies:   Her Current Medications Are:  Outpatient Encounter Medications as of 08/15/2017  Medication Sig  . CALCIUM PO Take by mouth.  Marland Kitchen KLOR-CON M20 20 MEQ tablet TAKE 1 TABLET BY MOUTH EVERY DAY  . losartan-hydrochlorothiazide (HYZAAR) 100-12.5 MG tablet TAKE 1 TABLET BY MOUTH EVERY DAY  . Multiple Vitamin (MULTIVITAMIN) capsule Take 1 capsule by mouth daily.  Marland Kitchen omeprazole (PRILOSEC) 40 MG capsule TAKE 1 CAPSULE BY MOUTH ONCE A DAY  . Probiotic Product (PROBIOTIC DAILY PO) Take 1 tablet by mouth daily.   Facility-Administered Encounter Medications as of 08/15/2017  Medication  . 0.9 %  sodium chloride infusion  :  Review of Systems:  Out of a complete 14 point  review of systems, all are reviewed and negative with the exception of these symptoms as listed below:  Review of Systems  Neurological:        Pt presents today to discuss her cpap. Pt reports that her cpap is going well.    Objective:  Neurological Exam  Physical Exam Physical Examination:   Vitals:   08/15/17 0825  BP: 100/70  Pulse: 60    General Examination: The patient is a very pleasant 52 y.o. female in no acute distress. She appears well-developed and well-nourished and well groomed. Good spirits.   HEENT:Normocephalic, atraumatic, pupils are equal, round and reactive to light. Extraocular tracking is good without limitation to gaze excursion or nystagmus noted. Corrective eyeglasses in place.Normal smooth pursuit is noted. Hearing is grossly intact. Face is symmetric with normal facial animation and normal facial sensation. Speech is clear with no dysarthria noted. There is no hypophonia. There is no lip, neck/head, jaw or voice tremor. Oropharynx exam reveals: good dental hygiene and moderate airway crowding. Mallampati is class II. Tongue protrudes centrally and palate elevates symmetrically. Mild mouth dryness noted.  Chest:Clear to auscultation without wheezing, rhonchi or crackles noted.  Heart:S1+S2+0, regular and normal without murmurs, rubs or gallops noted.   Abdomen:Soft, non-tender and non-distended with normal bowel sounds appreciated on auscultation.  Extremities:There is no new abnormality.   Skin: Warm and dry without trophic changes noted.  Musculoskeletal: exam reveals no obvious joint deformities, tenderness or joint swelling or erythema.   Neurologically:  Mental status: The patient is awake, alert and oriented in all 4 spheres. Her memory, attention, language and knowledge are appropriate. There is no aphasia, agnosia, apraxia or anomia. Speech is clear with normal prosody and enunciation. Thought process is linear. Mood is congruent and affect is normal.  Cranial nerves are as described above under HEENT exam.  Motor exam: Normal bulk, strength and tone is noted. There is no  tremor. Fine motor skills are grossly intact in the UEs and LEs.  Cerebellar testing shows no dysmetria or intention tremor. There is no truncal or gait ataxia.   Sensory exam is intact in the upper and lower extremities.  Gait, station and balance are unremarkable. No veering to one side is noted. No leaning to one side is noted. Posture is age-appropriate and stance is narrow based. No problems turning are noted.    Assessment and Plan:   In summary, SOSHA SHEPHERD is a very pleasant 52 year old female with an underlying medical history of irritable bowel syndrome, obesity, hypertension, reflux disease, kidney stones, s/p gastric sleeve in December 2017, who presents for followup consultation of her obstructive sleep apnea, treated with CPAP treatment at a pressure of 8 cwp with ongoing good results and excellent compliance. Her physical exam is stable. Weight has been stable lately. She is commended for her ongoing CPAP compliance.her blood pressure is low normal. She does report that at times she feels lightheaded. She is encouraged to talk to her primary care physician about her blood pressure medication, it may need to be adjusted. We can consider retesting her sleep with a home sleep test for sleep study down the [place]. Her weight is about the same as last year or so we will continue with CPAP for now. She's agreeable. Her ongoing weight loss success she may do well with a medical weight loss clinic, she is encouraged to talk to her primary care physician about this. I plan to see her routinely in one year, sooner if  needed. She is up-to-date with her supplies. She had questions about her CPAP supply bill. She is encouraged to get in touch with [facility] about this.  I answered all her questions today and she was in agreement with the plan.Of note, she had a Baseline sleep study on 08/12/2012, followed by a CPAP titration study on 09/08/2012.

## 2017-09-24 ENCOUNTER — Other Ambulatory Visit: Payer: Self-pay | Admitting: Family Medicine

## 2017-09-24 MED ORDER — POTASSIUM CHLORIDE CRYS ER 20 MEQ PO TBCR: 20.0000 meq | EXTENDED_RELEASE_TABLET | Freq: Every day | ORAL | 2 refills | Status: DC

## 2017-10-18 ENCOUNTER — Other Ambulatory Visit: Payer: Self-pay | Admitting: Family Medicine

## 2017-10-28 ENCOUNTER — Encounter (INDEPENDENT_AMBULATORY_CARE_PROVIDER_SITE_OTHER): Payer: 59

## 2017-10-30 ENCOUNTER — Ambulatory Visit: Payer: 59 | Admitting: Family Medicine

## 2017-10-30 ENCOUNTER — Encounter: Payer: Self-pay | Admitting: Family Medicine

## 2017-10-30 VITALS — BP 118/74 | HR 64 | Temp 98.6°F | Resp 16 | Ht 61.5 in | Wt 238.0 lb

## 2017-10-30 DIAGNOSIS — M25561 Pain in right knee: Secondary | ICD-10-CM

## 2017-10-30 MED ORDER — KNEE COMPRESSION SLEEVE/L/XL MISC: 1.0000 | Freq: Every day | 0 refills | Status: DC

## 2017-10-30 MED ORDER — MELOXICAM 7.5 MG PO TABS: 7.5000 mg | ORAL_TABLET | Freq: Every day | ORAL | 0 refills | Status: DC

## 2017-10-30 NOTE — Progress Notes (Signed)
Patient ID: Patricia [name], female    DOB: 01/28/1966, 53 y.o.   MRN: [id number]  PCP: Susy Frizzle, MD  Chief Complaint  Patient presents with  . Right knee pain    Subjective:   Patricia [name] is a 52 y.o. female, presents to clinic with CC of right knee pain  Knee Pain   There was no injury mechanism. The pain is present in the right knee. The quality of the pain is described as aching. The pain is at a severity of 9/10. The pain is severe. The pain has been worsening since onset. Pertinent negatives include no inability to bear weight, loss of motion, loss of sensation, muscle weakness, numbness or tingling. She reports no foreign bodies present. The symptoms are aggravated by weight bearing and movement. She has tried ice for the symptoms. The treatment provided no relief.   Pts right knee pain flared up after zumba, described as aching, severe enough to wake her up at night, although it has been acting up for a few month.  Her knee always pops but it is not painful.  She has not instability.  Is unsure if its mildly swollen.  k  Patient Active Problem List   Diagnosis Date Noted  . Obesity 03/12/2016  . Palpitations 07/07/2013  . Acute sinusitis 04/21/2013  . Obstructive sleep apnea (adult) (pediatric) 08/25/2012  . Hypersomnia with sleep apnea, unspecified 08/25/2012  . Bilateral lower extremity edema 11/27/2011  . Obesity, Class III, BMI 40-49.9 (morbid obesity) ([place]) 10/26/2011  . Essential hypertension 11/28/2008  . IRRITABLE BOWEL SYNDROME - diarrhea predominant 11/10/2008     Prior to Admission medications   Medication Sig Start Date End Date Taking? Authorizing Provider  CALCIUM PO Take by mouth.   Yes [provider]  losartan-hydrochlorothiazide (HYZAAR) 100-12.5 MG tablet TAKE 1 TABLET BY MOUTH EVERY DAY 10/18/17  Yes Susy Frizzle, MD  Multiple Vitamin (MULTIVITAMIN) capsule Take 1 capsule by mouth daily.   Yes [provider]    omeprazole (PRILOSEC) 40 MG capsule TAKE 1 CAPSULE BY MOUTH ONCE A DAY 03/11/17  Yes Susy Frizzle, MD  potassium chloride SA (KLOR-CON M20) 20 MEQ tablet Take 1 tablet (20 mEq total) by mouth daily. 09/24/17  Yes Susy Frizzle, MD  Probiotic Product (PROBIOTIC DAILY PO) Take 1 tablet by mouth daily.   Yes [provider]  Elastic Bandages & Supports (KNEE COMPRESSION SLEEVE/L/XL) MISC 1 Device by Does not apply route daily. 10/30/17   Delsa Grana, PA-C  meloxicam (MOBIC) 7.5 MG tablet Take 1 tablet (7.5 mg total) by mouth daily. 10/30/17   Delsa Grana, PA-C     No Known Allergies   Family History  Problem Relation Age of Onset  . Hypertension Father   . Hypertension Mother   . Diabetes Mother   . Coronary artery disease Mother   . Cancer Brother        carcinoid tumer of the appendix  . Colon cancer Neg Hx   . Esophageal cancer Neg Hx   . Stomach cancer Neg Hx   . Rectal cancer Neg Hx      Social History   Socioeconomic History  . Marital status: Married    Spouse name: Montine Circle  . Number of children: 2  . Years of education: 71  . Highest education level: Not on file  Occupational History  . Occupation: QUALITY ANALYST    Employer: [employer]  . Financial  resource strain: Not on file  . Food insecurity:    Worry: Not on file    Inability: Not on file  . Transportation needs:    Medical: Not on file    Non-medical: Not on file  Tobacco Use  . Smoking status: Never Smoker  . Smokeless tobacco: Never Used  Substance and Sexual Activity  . Alcohol use: Yes    Alcohol/week: 0.0 oz    Comment: socially  . Drug use: No  . Sexual activity: Yes    Birth control/protection: Surgical  Lifestyle  . Physical activity:    Days per week: Not on file    Minutes per session: Not on file  . Stress: Not on file  Relationships  . Social connections:    Talks on phone: Not on file    Gets together: Not on file    Attends religious  service: Not on file    Active member of club or organization: Not on file    Attends meetings of clubs or organizations: Not on file    Relationship status: Not on file  . Intimate partner violence:    Fear of current or ex partner: Not on file    Emotionally abused: Not on file    Physically abused: Not on file    Forced sexual activity: Not on file  Other Topics Concern  . Not on file  Social History Narrative   Patient is right handed and consumes green tea daily     Review of Systems  Constitutional: Negative.   HENT: Negative.   Eyes: Negative.   Respiratory: Negative.   Cardiovascular: Negative.   Gastrointestinal: Negative.   Endocrine: Negative.   Genitourinary: Negative.   Musculoskeletal: Negative.   Skin: Negative.   Allergic/Immunologic: Negative.   Neurological: Negative.  Negative for tingling and numbness.  Hematological: Negative.   Psychiatric/Behavioral: Negative.   All other systems reviewed and are negative.      Objective:    Vitals:   10/30/17 1408  BP: 118/74  Pulse: 64  Resp: 16  Temp: 98.6 F (37 C)  TempSrc: Oral  SpO2: 99%  Weight: 238 lb (108 kg)  Height: 5' 1.5" (1.562 m)      Physical Exam  Constitutional: She appears well-developed.  HENT:  Head: Normocephalic and atraumatic.  Nose: Nose normal.  Eyes: Conjunctivae are normal. Right eye exhibits no discharge. Left eye exhibits no discharge.  Neck: No tracheal deviation present.  Cardiovascular: Normal rate and regular rhythm.  Pulmonary/Chest: Effort normal. No stridor. No respiratory distress.  Musculoskeletal: Normal range of motion. She exhibits no edema, tenderness or deformity.       Left hip: Normal.       Left knee: She exhibits normal range of motion, no swelling, no effusion, no ecchymosis, no deformity, no erythema, normal alignment, no LCL laxity, normal patellar mobility, no bony tenderness, normal meniscus and no MCL laxity. No tenderness found.       Left  ankle: Normal.  Neurological: She is alert. She exhibits normal muscle tone. Coordination normal.  Skin: Skin is warm and dry. No rash noted.  Psychiatric: She has a normal mood and affect. Her behavior is normal.  Nursing note and vitals reviewed.         Assessment & Plan:      ICD-10-CM   1. Right knee pain, unspecified chronicity M25.561 DG Knee Complete 4 Views Right    meloxicam (MOBIC) 7.5 MG tablet    Pt with several  month of intermittent right knee irritation followed by acutely exacerbated pain after zumba.  Physical exam unremarkable, no bony tenderness, tendon/ligament tenderness or instability.  Normal ROM strength, sensation, no limp.  Advised x-ray to evaluated joint space for possible OA, RICE tx, knee sleeve, mobic as tolerated.   Delsa Grana, PA-C 10/30/17 2:33 PM

## 2017-10-30 NOTE — Patient Instructions (Signed)
Get and wear a compression knee sleeve or brace - can get at pharmacies or local [facility]  Rest, ice and elevate leg.  Do not do any high impact activity for the next 1-2 weeks to allow time to heal.  See instructions below re: icing and brace  Get Xray  Follow up in 2-4 weeks for recheck.  If not improving would want to send you to an orthopedic doctor for further evaluation, testing, and treatment.    Cryotherapy WHAT IS CRYOTHERAPY? Cryotherapy, or cold therapy, is a treatment that uses cold temperatures to treat an injury or medical condition. It includes using cold packs or ice packs to reduce pain and swelling. Only use cryotherapy if your doctor says it is okay. HOW DO I USE CRYOTHERAPY?  Place a towel between the cold source and your skin.  Apply the cold source for no more than 20 minutes at a time.  Check your skin after 5 minutes to make sure there are no signs of a poor response to cold or skin damage. Check for: ? White spots on your skin. Your skin may look blotchy or mottled. ? Skin that looks blue or pale. ? Skin that feels waxy or hard.  Repeat these steps as many times each day as told by your doctor.  HOW CAN I MAKE A COLD PACK? When using a cold pack at home to reduce pain and swelling, you can use:  A silica gel cold pack that has been left in the freezer. You can buy this online or in stores.  A plastic bag of frozen vegetables.  A sealable plastic bag that has been filled with crushed ice.  Always wrap the pack in a dry or damp towel to avoid direct contact with your skin. WHEN SHOULD I CALL MY DOCTOR? Call your doctor if:  You start to have white spots on your skin. This may give your skin a blotchy or mottled look.  Your skin turns blue or pale.  Your skin becomes waxy or hard.  Your swelling gets worse.  This information is not intended to replace advice given to you by your health care provider. Make sure you discuss any questions you have with  your health care provider. Document Released: 08/29/2007 Document Revised: 08/18/2015 Document Reviewed: 11/24/2014 Elsevier Interactive Patient Education  2017 [place] What does an elastic bandage do? Elastic bandages come in different shapes and sizes. They generally provide support to your injury and reduce swelling while you are healing, but they can perform different functions. Your health care provider will help you to decide what is best for your protection, recovery, or rehabilitation following an injury. What are some general tips for using an elastic bandage?  Use the bandage as directed by the maker of the bandage that you are using.  Do not wrap the bandage too tightly. This may cut off the circulation in the arm or leg in the area below the bandage. ? If part of your body beyond the bandage becomes blue, numb, cold, swollen, or is more painful, your bandage is most likely too tight. If this occurs, remove your bandage and reapply it more loosely.  See your health care provider if the bandage seems to be making your problems worse rather than better.  An elastic bandage should be removed and reapplied every 3-4 hours or as directed by your health care provider. What is RICE? The routine care of many injuries includes rest, ice, compression,  and elevation (RICE therapy). Rest Rest is required to allow your body to heal. Generally, you can resume your routine activities when you are comfortable and have been given permission by your health care provider. Ice Icing your injury helps to keep the swelling down and it reduces pain. Do not apply ice directly to your skin.  Put ice in a plastic bag.  Place a towel between your skin and the bag.  Leave the ice on for 20 minutes, 2-3 times per day.  Do this for as long as you are directed by your health care provider. Compression Compression helps to keep swelling down, gives support, and helps with  discomfort. Compression may be done with an elastic bandage. Elevation Elevation helps to reduce swelling and it decreases pain. If possible, your injured area should be placed at or above the level of your heart or the center of your chest. When should I seek medical care? You should seek medical care if:  You have persistent pain and swelling.  Your symptoms are getting worse rather than improving.  These symptoms may indicate that further evaluation or further X-rays are needed. Sometimes, X-rays may not show a small broken bone (fracture) until a number of days later. Make a follow-up appointment with your health care provider. Ask when your X-ray results will be ready. Make sure that you get your X-ray results. When should I seek immediate medical care? You should seek immediate medical care if:  You have a sudden onset of severe pain at or below the area of your injury.  You develop redness or increased swelling around your injury.  You have tingling or numbness at or below the area of your injury that does not improve after you remove the elastic bandage.  This information is not intended to replace advice given to you by your health care provider. Make sure you discuss any questions you have with your health care provider. Document Released: 09/01/2001 Document Revised: 02/06/2016 Document Reviewed: 10/26/2013 Elsevier Interactive Patient Education  2018 [employer].

## 2017-11-05 ENCOUNTER — Ambulatory Visit (INDEPENDENT_AMBULATORY_CARE_PROVIDER_SITE_OTHER): Payer: 59 | Admitting: Family Medicine

## 2017-11-08 ENCOUNTER — Ambulatory Visit (HOSPITAL_COMMUNITY)
Admission: RE | Admit: 2017-11-08 | Discharge: 2017-11-08 | Disposition: A | Discharge status: Home / Self Care | Payer: 59 | Source: Ambulatory Visit | Attending: Family Medicine | Admitting: Family Medicine

## 2017-11-08 DIAGNOSIS — M25561 Pain in right knee: Secondary | ICD-10-CM | POA: Insufficient documentation

## 2017-11-10 ENCOUNTER — Encounter (INDEPENDENT_AMBULATORY_CARE_PROVIDER_SITE_OTHER): Payer: Self-pay

## 2017-11-11 ENCOUNTER — Encounter (INDEPENDENT_AMBULATORY_CARE_PROVIDER_SITE_OTHER): Payer: Self-pay | Admitting: Bariatrics

## 2017-11-11 ENCOUNTER — Ambulatory Visit (INDEPENDENT_AMBULATORY_CARE_PROVIDER_SITE_OTHER): Payer: 59 | Admitting: Bariatrics

## 2017-11-11 ENCOUNTER — Ambulatory Visit (INDEPENDENT_AMBULATORY_CARE_PROVIDER_SITE_OTHER): Payer: 59 | Admitting: Family Medicine

## 2017-11-11 VITALS — BP 117/76 | HR 63 | Temp 97.7°F | Ht 62.0 in | Wt 237.0 lb

## 2017-11-11 DIAGNOSIS — G4733 Obstructive sleep apnea (adult) (pediatric): Secondary | ICD-10-CM

## 2017-11-11 DIAGNOSIS — I10 Essential (primary) hypertension: Secondary | ICD-10-CM | POA: Diagnosis not present

## 2017-11-11 DIAGNOSIS — Z0289 Encounter for other administrative examinations: Secondary | ICD-10-CM

## 2017-11-11 DIAGNOSIS — Z6841 Body Mass Index (BMI) 40.0 and over, adult: Secondary | ICD-10-CM

## 2017-11-11 DIAGNOSIS — Z9189 Other specified personal risk factors, not elsewhere classified: Secondary | ICD-10-CM

## 2017-11-11 DIAGNOSIS — E559 Vitamin D deficiency, unspecified: Secondary | ICD-10-CM

## 2017-11-11 DIAGNOSIS — R5383 Other fatigue: Secondary | ICD-10-CM | POA: Diagnosis not present

## 2017-11-11 DIAGNOSIS — Z1331 Encounter for screening for depression: Secondary | ICD-10-CM

## 2017-11-11 NOTE — Progress Notes (Addendum)
Office: [phone]  /  Fax: [phone]   Dear Dr. Rexene Alberts,   Thank you for referring Patricia [name] to our clinic. The following note includes my evaluation and treatment recommendations.  HPI:   Chief Complaint: OBESITY    Patricia [name] has been referred by Star Age, MD for consultation regarding her obesity and obesity related comorbidities.    Patricia [name] (MR# [id number]) is a 52 y.o. female who presents on 11/11/2017 for obesity evaluation and treatment. Current BMI is Body mass index is 43.35 kg/m.Marland Kitchen Patricia [name] has been struggling with her weight for many years and has been unsuccessful in either losing weight, maintaining weight loss, or reaching her healthy weight goal.     Patricia [name] attended our information session and states she is currently in the action stage of change and ready to dedicate time achieving and maintaining a healthier weight. Patricia [name] is interested in becoming our patient and working on intensive lifestyle modifications including (but not limited to) diet, exercise and weight loss.    Patricia [name] states her family eats meals together she thinks her family will eat healthier with  her her desired weight loss is 48 lbs she has been heavy most of  her life she started gaining weight when she started working 3rd shift her heaviest weight ever was 275 lbs. she skips meals frequently she struggles with emotional eating   History of Gastric Sleeve 2017 at [facility] by Dr. Kieth Brightly. Her highest weight was at 275 lbs and her lowest weight was at 228. Her total weight loss was approximately 50 lbs. Patient states she has had follow up with Dr. Kieth Brightly this year. She has been on metformin with no benefit. Her hypertension improved after gastric sleeve surgery and lower extremity edema resolved. She reports not getting enough protein intake or H2O intake.   Fatigue Patricia [name] feels her energy is lower than it should be. This has worsened with weight gain and has not worsened  recently. Patricia [name] admits to daytime somnolence and  denies waking up still tired. Patient has a diagnosis of obstructive sleep apnea, which may be contributing to her fatigue. Patricia [name] has a history of symptoms of daytime fatigue and hypertension. Patient generally gets 6 hours of sleep per night, and states they generally have restful sleep. Snoring is present. Apneic episodes are present. Epworth Sleepiness Score is 14  Vitamin D deficiency (new) Patricia [name] has a likely diagnosis of vitamin D deficiency due to gastric bypass surgery and obesity. Patricia [name] is not currently taking vit D and she admits to some fatigue and denies nausea, vomiting or muscle weakness.  Hypertension Patricia [name] is a 52 y.o. female with hypertension. Patricia [name] is on blood pressure medications without side effects. Patricia [name] denies chest pain or headache. She is working weight loss to help control her blood pressure with the goal of decreasing her risk of heart attack and stroke. Patricia [name] blood pressure is currently controlled.  Obstructive Sleep Apnea (wearing CPAP) Patricia [name] has a diagnosis of obstructive sleep apnea and is currently wearing CPAP. Patient reports that she is wearing CPAP nightly.  At risk for diabetes Patricia [name] is at higher than average risk for developing diabetes due to her obesity. She currently denies polyuria or polydipsia.  Depression Screen Patricia [name]'s Food and Mood (modified PHQ-9) score was  Depression screen PHQ 2/9 11/11/2017  Decreased Interest 0  Down, Depressed, Hopeless 0  PHQ - 2 Score 0  Altered sleeping 0  Tired, decreased energy 0  Change in appetite  1  Feeling bad or failure about yourself  0  Trouble concentrating 0  Moving slowly or fidgety/restless 0  Suicidal thoughts 0  PHQ-9 Score 1  Difficult doing work/chores Not difficult at all    ALLERGIES: No Known Allergies  MEDICATIONS: Current Outpatient Medications on File Prior to Visit  Medication Sig Dispense Refill    losartan-hydrochlorothiazide (HYZAAR) 100-12.5 MG tablet TAKE 1 TABLET BY MOUTH EVERY DAY 30 tablet 5   Multiple Vitamin (MULTIVITAMIN) capsule Take 1 capsule by mouth daily.     omeprazole (PRILOSEC) 40 MG capsule TAKE 1 CAPSULE BY MOUTH ONCE A DAY 90 capsule 3   potassium chloride SA (KLOR-CON M20) 20 MEQ tablet Take 1 tablet (20 mEq total) by mouth daily. 90 tablet 2   Probiotic Product (PROBIOTIC DAILY PO) Take 1 tablet by mouth daily.     Elastic Bandages & Supports (KNEE COMPRESSION SLEEVE/L/XL) MISC 1 Device by Does not apply route daily. 1 each 0   Current Facility-Administered Medications on File Prior to Visit  Medication Dose Route Frequency Provider Last Rate Last Dose   0.9 %  sodium chloride infusion  500 mL Intravenous Once Gatha Mayer, MD        PAST MEDICAL HISTORY: Past Medical History:  Diagnosis Date   Arthritis    "spine-DDD-intermittent sciatic pain"   Chronic cervicitis    resolved   Diabetes mellitus without complication (HCC)    diet controlled   GERD (gastroesophageal reflux disease)    Heart murmur    "benign"-routinely sees yearly - Dr. Deretha Emory, cardiology   History of kidney stones    multiple kidney stones-passed.   Hypertension    IBS (irritable bowel syndrome)    controlled with diet   Menorrhagia    resolved s/p Hysterectomy   Obesity    Sleep apnea    cpap used nightly   Vitamin D deficiency     PAST SURGICAL HISTORY: Past Surgical History:  Procedure Laterality Date   BREAST LUMPECTOMY Left    benign hamartoma   CESAREAN SECTION     x1   COLONOSCOPY  10/19/2005   ESOPHAGOGASTRODUODENOSCOPY  12/21/2008   HYSTEROSCOPY W/D&C     resection of endometrial polyp   LAPAROSCOPIC GASTRIC SLEEVE RESECTION N/A 03/12/2016   Procedure: LAPAROSCOPIC GASTRIC SLEEVE RESECTION WITH UPPER ENDO;  Surgeon: Arta Bruce Kinsinger, MD;  Location: [facility];  Service: General;  Laterality: N/A;   LAPAROSCOPIC GASTRIC SLEEVE  RESECTION  03/22/2016   POLYPECTOMY     uterus   TUBAL LIGATION     uterine ablation     Uterine Polyps Removed     VAGINAL HYSTERECTOMY     fibroids    SOCIAL HISTORY: Social History   Tobacco Use   Smoking status: Never Smoker   Smokeless tobacco: Never Used  Substance Use Topics   Alcohol use: Yes    Alcohol/week: 0.0 standard drinks    Comment: socially   Drug use: No    FAMILY HISTORY: Family History  Problem Relation Age of Onset   Hypertension Father    Hypertension Mother    Diabetes Mother    Coronary artery disease Mother    Sudden death Mother    Cancer Brother        carcinoid tumer of the appendix   Colon cancer Neg Hx    Esophageal cancer Neg Hx    Stomach cancer Neg Hx    Rectal cancer Neg Hx     ROS: Review of  Systems  Constitutional: Positive for malaise/fatigue.  Eyes:       Wear Glasses or Contacts  Cardiovascular: Negative for chest pain.  Gastrointestinal: Negative for nausea and vomiting.  Genitourinary: Negative for frequency.  Musculoskeletal:       Muscle or Joint Pain Negative for muscle weakness  Neurological: Negative for headaches.  Endo/Heme/Allergies: Negative for polydipsia.    PHYSICAL EXAM: Blood pressure 117/76, pulse 63, temperature 97.7 F (36.5 C), temperature source Oral, height 5\' 2"  (1.575 m), weight 237 lb (107.5 kg), SpO2 100 %. Body mass index is 43.35 kg/m. Physical Exam  Constitutional: She is oriented to person, place, and time. She appears well-developed and well-nourished.  HENT:  Head: Normocephalic and atraumatic.  Nose: Nose normal.  Eyes: EOM are normal. No scleral icterus.  Neck: Normal range of motion. Neck supple. No thyromegaly present.  Cardiovascular: Regular rhythm. Bradycardia present.  Pulmonary/Chest: Effort normal. No respiratory distress.  Abdominal: Soft. There is no tenderness.  + obesity  Musculoskeletal: Normal range of motion. She exhibits no edema.  Range of  Motion normal in all 4 extremities  Neurological: She is alert and oriented to person, place, and time. Coordination normal.  Skin: Skin is warm and dry.  Psychiatric: She has a normal mood and affect. Her behavior is normal.  Vitals reviewed.   RECENT LABS AND TESTS: BMET    Component Value Date/Time   NA 140 08/01/2017 0955   K 4.2 08/01/2017 0955   CL 103 08/01/2017 0955   CO2 30 08/01/2017 0955   GLUCOSE 88 08/01/2017 0955   BUN 12 08/01/2017 0955   CREATININE 0.62 08/01/2017 0955   CALCIUM 9.8 08/01/2017 0955   GFRNONAA 105 08/01/2017 0955   GFRAA 122 08/01/2017 0955   Lab Results  Component Value Date   HGBA1C 5.5 08/01/2017   No results found for: INSULIN CBC    Component Value Date/Time   WBC 4.3 08/01/2017 0955   RBC 4.69 08/01/2017 0955   HGB 13.6 08/01/2017 0955   HCT 40.2 08/01/2017 0955   PLT 254 08/01/2017 0955   MCV 85.7 08/01/2017 0955   MCH 29.0 08/01/2017 0955   MCHC 33.8 08/01/2017 0955   RDW 12.8 08/01/2017 0955   LYMPHSABS 1,656 08/01/2017 0955   MONOABS 0.3 03/13/2016 0436   EOSABS 69 08/01/2017 0955   BASOSABS 30 08/01/2017 0955   Iron/TIBC/Ferritin/ %Sat No results found for: IRON, TIBC, FERRITIN, IRONPCTSAT Lipid Panel     Component Value Date/Time   CHOL 205 (H) 08/01/2017 0955   TRIG 100 08/01/2017 0955   HDL 69 08/01/2017 0955   CHOLHDL 3.0 08/01/2017 0955   VLDL 16 12/22/2013 1142   LDLCALC 115 (H) 08/01/2017 0955   Hepatic Function Panel     Component Value Date/Time   PROT 6.7 08/01/2017 0955   ALBUMIN 4.0 03/13/2016 0436   AST 18 08/01/2017 0955   ALT 11 08/01/2017 0955   ALKPHOS 63 03/13/2016 0436   BILITOT 0.4 08/01/2017 0955   BILIDIR 0.1 06/23/2012 0825   IBILI 0.3 06/23/2012 0825      Component Value Date/Time   TSH 1.656 12/22/2013 1142   TSH 1.901 06/23/2012 0825    ECG  shows NSR with a rate of 59 BPM INDIRECT CALORIMETER done today shows a VO2 of 208 and a REE of 1447.  Her calculated basal metabolic  rate is 1610 thus her basal metabolic rate is worse than expected.    ASSESSMENT AND PLAN: Other fatigue - Plan: EKG 12-Lead,  Comprehensive metabolic panel, Vitamin U76, Folate, Hemoglobin A1c, Insulin, random, Lipid Panel With LDL/HDL Ratio, T3, T4, free, TSH  OSA (obstructive sleep apnea)  Essential hypertension - Plan: Comprehensive metabolic panel  Vitamin D deficiency - Plan: VITAMIN D 25 Hydroxy (Vit-D Deficiency, Fractures)  Depression screening  At risk for diabetes mellitus  Class 3 severe obesity with serious comorbidity and body mass index (BMI) of 40.0 to 44.9 in adult, unspecified obesity type (HCC)  PLAN: Fatigue Patricia [name] was informed that her fatigue may be related to obesity, depression or many other causes. Labs will be ordered, and in the meanwhile Patricia [name] has agreed to work on diet, exercise and weight loss to help with fatigue. Proper sleep hygiene was discussed including the need for 7-8 hours of quality sleep each night. A sleep study was not ordered based on symptoms and Epworth score.  Vitamin D Deficiency (new) Patricia [name] was informed that low vitamin D levels contributes to fatigue and are associated with obesity, breast, and colon cancer. She agrees to start prescription Vit D @50 ,000 IU every week #4 with no refills. We will check vitamin D level today and will adjust prescription accordingly based on vitamin D lab results and will follow up for routine testing of vitamin D, at least 2-3 times per year. She was informed of the risk of over-replacement of vitamin D and agrees to not increase her dose unless she discusses this with [place] first. Adely agrees to follow up as directed.  Hypertension We discussed sodium restriction, working on healthy weight loss, and a regular exercise program as the means to achieve improved blood pressure control. Patricia [name] agreed with this plan and agreed to follow up as directed. We will continue to monitor her blood pressure as well as her  progress with the above lifestyle modifications. She will continue her medications as prescribed and will minimize her sodium intake. Solyana will watch for signs of hypotension as she continues her lifestyle modifications.  Obstructive Sleep Apnea (wearing CPAP) Alyn will continue to wear CPAP nightly and get at least 7 hours of sleep per night. Dimonique will follow up with our clinic in 2 weeks.  Diabetes risk counseling Nahlia was given extended (15 minutes) diabetes prevention counseling today. She is 52 y.o. female and has risk factors for diabetes including obesity. We discussed intensive lifestyle modifications today with an emphasis on weight loss as well as increasing exercise and decreasing simple carbohydrates in her diet.  Depression Screen Shailyn had a negative depression screening. Depression is commonly associated with obesity and often results in emotional eating behaviors. We will monitor this closely and work on CBT to help improve the non-hunger eating patterns.   Obesity Maralee is currently in the action stage of change and her goal is to continue with weight loss efforts. I recommend Edit begin the structured treatment plan as follows:  She has agreed to follow the Category 2 plan Elya has been instructed to eventually work up to a goal of 150 minutes of combined cardio and strengthening exercise per week or Zumba with weights 1 hour 3 days per week for weight loss and overall health benefits. We discussed the following Behavioral Modification Strategies today: increase H2O intake, keeping healthy foods in the home, planning for success, increasing lean protein intake, decreasing sodium intake and work on meal planning and easy cooking plans   She was informed of the importance of frequent follow up visits to maximize her success with intensive lifestyle modifications for her multiple health conditions.  She was informed we would discuss her lab results at her next visit  unless there is a critical issue that needs to be addressed sooner. Melisssa agreed to keep her next visit at the agreed upon time to discuss these results.    OBESITY BEHAVIORAL INTERVENTION VISIT  Today's visit was # 1 out of 22.  Starting weight: 237 lbs Starting date: 11/11/17 Today's weight : 237 lbs  Today's date: 11/11/2017 Total lbs lost to date: 0 (Patients must lose 7 lbs in the first 6 months to continue with counseling)   ASK: We discussed the diagnosis of obesity with Patricia [name] today and Patricia [name] agreed to give [place] permission to discuss obesity behavioral modification therapy today.  ASSESS: Trana has the diagnosis of obesity and her BMI today is 43.34 Faiza is in the action stage of change   ADVISE: Daisy was educated on the multiple health risks of obesity as well as the benefit of weight loss to improve her health. She was advised of the need for long term treatment and the importance of lifestyle modifications.  AGREE: Multiple dietary modification options and treatment options were discussed and  Denasia agreed to the above obesity treatment plan.   Corey Skains, am acting as Location manager for  [employer]. Owens Shark, DO  I have reviewed the above documentation for accuracy and completeness, and I agree with the above. -Jearld Lesch, DO

## 2017-11-12 LAB — COMPREHENSIVE METABOLIC PANEL
ALT: 12 IU/L (ref 0–32)
AST: 14 IU/L (ref 0–40)
Albumin/Globulin Ratio: 1.6 (ref 1.2–2.2)
Albumin: 4.1 g/dL (ref 3.5–5.5)
Alkaline Phosphatase: 96 IU/L (ref 39–117)
BUN/Creatinine Ratio: 18 (ref 9–23)
BUN: 11 mg/dL (ref 6–24)
Bilirubin Total: 0.3 mg/dL (ref 0.0–1.2)
CALCIUM: 9.6 mg/dL (ref 8.7–10.2)
CO2: 26 mmol/L (ref 20–29)
CREATININE: 0.6 mg/dL (ref 0.57–1.00)
Chloride: 105 mmol/L (ref 96–106)
GFR calc Af Amer: 123 mL/min/{1.73_m2} (ref >59)
GFR calc non Af Amer: 107 mL/min/{1.73_m2} (ref >59)
GLOBULIN, TOTAL: 2.5 g/dL (ref 1.5–4.5)
GLUCOSE: 84 mg/dL (ref 65–99)
Potassium: 4.3 mmol/L (ref 3.5–5.2)
SODIUM: 138 mmol/L (ref 134–144)
Total Protein: 6.6 g/dL (ref 6.0–8.5)

## 2017-11-12 LAB — VITAMIN B12

## 2017-11-12 LAB — INSULIN, RANDOM: INSULIN: 14.6 u[IU]/mL (ref 2.6–24.9)

## 2017-11-12 LAB — LIPID PANEL WITH LDL/HDL RATIO
CHOLESTEROL TOTAL: 204 mg/dL — AB (ref 100–199)
HDL: 74 mg/dL (ref >39)
LDL CALC: 107 mg/dL — AB (ref 0–99)
LDl/HDL Ratio: 1.4 ratio (ref 0.0–3.2)
Triglycerides: 113 mg/dL (ref 0–149)
VLDL CHOLESTEROL CAL: 23 mg/dL (ref 5–40)

## 2017-11-12 LAB — HEMOGLOBIN A1C
ESTIMATED AVERAGE GLUCOSE: 120 mg/dL
HEMOGLOBIN A1C: 5.8 % — AB (ref 4.8–5.6)

## 2017-11-12 LAB — T4, FREE: Free T4: 1.19 ng/dL (ref 0.82–1.77)

## 2017-11-12 LAB — TSH: TSH: 1.57 u[IU]/mL (ref 0.450–4.500)

## 2017-11-12 LAB — T3: T3, Total: 134 ng/dL (ref 71–180)

## 2017-11-12 LAB — FOLATE: FOLATE: 19.9 ng/mL (ref >3.0)

## 2017-11-12 LAB — VITAMIN D 25 HYDROXY (VIT D DEFICIENCY, FRACTURES): Vit D, 25-Hydroxy: 40 ng/mL (ref 30.0–100.0)

## 2017-11-26 ENCOUNTER — Ambulatory Visit (INDEPENDENT_AMBULATORY_CARE_PROVIDER_SITE_OTHER): Payer: 59 | Admitting: Bariatrics

## 2017-11-26 ENCOUNTER — Encounter (INDEPENDENT_AMBULATORY_CARE_PROVIDER_SITE_OTHER): Payer: Self-pay | Admitting: Bariatrics

## 2017-11-26 VITALS — BP 113/80 | HR 57 | Temp 98.5°F | Ht 62.0 in | Wt 235.0 lb

## 2017-11-26 DIAGNOSIS — E559 Vitamin D deficiency, unspecified: Secondary | ICD-10-CM

## 2017-11-26 DIAGNOSIS — I10 Essential (primary) hypertension: Secondary | ICD-10-CM

## 2017-11-26 DIAGNOSIS — Z6841 Body Mass Index (BMI) 40.0 and over, adult: Secondary | ICD-10-CM

## 2017-11-26 DIAGNOSIS — Z9189 Other specified personal risk factors, not elsewhere classified: Secondary | ICD-10-CM

## 2017-11-26 MED ORDER — VITAMIN D (ERGOCALCIFEROL) 1.25 MG (50000 UNIT) PO CAPS: 50000.0000 [IU] | ORAL_CAPSULE | ORAL | 0 refills | Status: DC

## 2017-11-26 NOTE — Progress Notes (Signed)
Office: [phone]  /  Fax: [phone]   HPI:   Chief Complaint: OBESITY Patricia [name] is here to discuss her progress with her obesity treatment plan. She is on the Category 2 plan and is following her eating plan approximately 98 % of the time. She states she is at the gym lifting weights, and doing zumba for 60 minutes 3 times per week. Patricia [name] has a history of gastric sleeve in 2017 per Dr. Fuller Plan at [facility], and she has had a follow up. She notes is is not struggling with the diet and she is getting in all protein.  Her weight is 235 lb (106.6 kg) today and has had a weight loss of 2 pounds over a period of 2 weeks since her last visit. She has lost 2 lbs since starting treatment with [place].  Vitamin D Deficiency Patricia [name] has a diagnosis of vitamin D deficiency. She is not currently taking Vit D and denies nausea, vomiting or muscle weakness.  Hypertension Patricia [name] is a 52 y.o. female with hypertension. Patricia [name]'s blood pressure is controlled and she denies side effects with medications or hypotensive symptoms. She is working weight loss to help control her blood pressure with the goal of decreasing her risk of heart attack and stroke.  At risk for diabetes Patricia [name] is at higher than average risk for developing diabetes due to her obesity. She currently denies polyuria or polydipsia.  ALLERGIES: No Known Allergies  MEDICATIONS: Current Outpatient Medications on File Prior to Visit  Medication Sig Dispense Refill  . Elastic Bandages & Supports (KNEE COMPRESSION SLEEVE/L/XL) MISC 1 Device by Does not apply route daily. 1 each 0  . losartan-hydrochlorothiazide (HYZAAR) 100-12.5 MG tablet TAKE 1 TABLET BY MOUTH EVERY DAY 30 tablet 5  . Multiple Vitamin (MULTIVITAMIN) capsule Take 1 capsule by mouth daily.    Marland Kitchen omeprazole (PRILOSEC) 40 MG capsule TAKE 1 CAPSULE BY MOUTH ONCE A DAY 90 capsule 3  . potassium chloride SA (KLOR-CON M20) 20 MEQ tablet Take 1 tablet (20 mEq total) by mouth  daily. 90 tablet 2  . Probiotic Product (PROBIOTIC DAILY PO) Take 1 tablet by mouth daily.     Current Facility-Administered Medications on File Prior to Visit  Medication Dose Route Frequency Provider Last Rate Last Dose  . 0.9 %  sodium chloride infusion  500 mL Intravenous Once Gatha Mayer, MD        PAST MEDICAL HISTORY: Past Medical History:  Diagnosis Date  . Arthritis    "spine-DDD-intermittent sciatic pain"  . Chronic cervicitis    resolved  . Diabetes mellitus without complication (HCC)    diet controlled  . GERD (gastroesophageal reflux disease)   . Heart murmur    "benign"-routinely sees yearly - Dr. Deretha Emory, cardiology  . History of kidney stones    multiple kidney stones-passed.  . Hypertension   . IBS (irritable bowel syndrome)    controlled with diet  . Menorrhagia    resolved s/p Hysterectomy  . Obesity   . Sleep apnea    cpap used nightly  . Vitamin D deficiency     PAST SURGICAL HISTORY: Past Surgical History:  Procedure Laterality Date  . BREAST LUMPECTOMY Left    benign hamartoma  . CESAREAN SECTION     x1  . COLONOSCOPY  10/19/2005  . ESOPHAGOGASTRODUODENOSCOPY  12/21/2008  . HYSTEROSCOPY W/D&C     resection of endometrial polyp  . LAPAROSCOPIC GASTRIC SLEEVE RESECTION N/A 03/12/2016   Procedure: LAPAROSCOPIC GASTRIC SLEEVE  RESECTION WITH UPPER ENDO;  Surgeon: Arta Bruce Kinsinger, MD;  Location: [facility];  Service: General;  Laterality: N/A;  . LAPAROSCOPIC GASTRIC SLEEVE RESECTION  03/22/2016  . POLYPECTOMY     uterus  . TUBAL LIGATION    . uterine ablation    . Uterine Polyps Removed    . VAGINAL HYSTERECTOMY     fibroids    SOCIAL HISTORY: Social History   Tobacco Use  . Smoking status: Never Smoker  . Smokeless tobacco: Never Used  Substance Use Topics  . Alcohol use: Yes    Alcohol/week: 0.0 standard drinks    Comment: socially  . Drug use: No    FAMILY HISTORY: Family History  Problem Relation Age of Onset  .  Hypertension Father   . Hypertension Mother   . Diabetes Mother   . Coronary artery disease Mother   . Sudden death Mother   . Cancer Brother        carcinoid tumer of the appendix  . Colon cancer Neg Hx   . Esophageal cancer Neg Hx   . Stomach cancer Neg Hx   . Rectal cancer Neg Hx     ROS: Review of Systems  Constitutional: Positive for weight loss.  Gastrointestinal: Negative for nausea and vomiting.  Genitourinary: Negative for frequency.  Musculoskeletal:       Negative muscle weakness  Endo/Heme/Allergies: Negative for polydipsia.    PHYSICAL EXAM: Blood pressure 113/80, pulse (!) 57, temperature 98.5 F (36.9 C), temperature source Oral, height 5\' 2"  (1.575 m), weight 235 lb (106.6 kg), SpO2 99 %. Body mass index is 42.98 kg/m. Physical Exam  Constitutional: She is oriented to person, place, and time. She appears well-developed and well-nourished.  Cardiovascular: Normal rate.  Pulmonary/Chest: Effort normal.  Musculoskeletal: Normal range of motion.  Neurological: She is oriented to person, place, and time.  Skin: Skin is warm and dry.  Psychiatric: She has a normal mood and affect. Her behavior is normal.  Vitals reviewed.   RECENT LABS AND TESTS: BMET    Component Value Date/Time   NA 138 11/11/2017 1137   K 4.3 11/11/2017 1137   CL 105 11/11/2017 1137   CO2 26 11/11/2017 1137   GLUCOSE 84 11/11/2017 1137   GLUCOSE 88 08/01/2017 0955   BUN 11 11/11/2017 1137   CREATININE 0.60 11/11/2017 1137   CREATININE 0.62 08/01/2017 0955   CALCIUM 9.6 11/11/2017 1137   GFRNONAA 107 11/11/2017 1137   GFRNONAA 105 08/01/2017 0955   GFRAA 123 11/11/2017 1137   GFRAA 122 08/01/2017 0955   Lab Results  Component Value Date   HGBA1C 5.8 (H) 11/11/2017   HGBA1C 5.5 08/01/2017   HGBA1C 6.0 (H) 03/06/2016   HGBA1C 6.0 (H) 07/02/2014   HGBA1C 5.6 11/27/2011   Lab Results  Component Value Date   INSULIN 14.6 11/11/2017   CBC    Component Value Date/Time    WBC 4.3 08/01/2017 0955   RBC 4.69 08/01/2017 0955   HGB 13.6 08/01/2017 0955   HCT 40.2 08/01/2017 0955   PLT 254 08/01/2017 0955   MCV 85.7 08/01/2017 0955   MCH 29.0 08/01/2017 0955   MCHC 33.8 08/01/2017 0955   RDW 12.8 08/01/2017 0955   LYMPHSABS 1,656 08/01/2017 0955   MONOABS 0.3 03/13/2016 0436   EOSABS 69 08/01/2017 0955   BASOSABS 30 08/01/2017 0955   Iron/TIBC/Ferritin/ %Sat No results found for: IRON, TIBC, FERRITIN, IRONPCTSAT Lipid Panel     Component Value Date/Time   CHOL  204 (H) 11/11/2017 1137   TRIG 113 11/11/2017 1137   HDL 74 11/11/2017 1137   CHOLHDL 3.0 08/01/2017 0955   VLDL 16 12/22/2013 1142   LDLCALC 107 (H) 11/11/2017 1137   LDLCALC 115 (H) 08/01/2017 0955   Hepatic Function Panel     Component Value Date/Time   PROT 6.6 11/11/2017 1137   ALBUMIN 4.1 11/11/2017 1137   AST 14 11/11/2017 1137   ALT 12 11/11/2017 1137   ALKPHOS 96 11/11/2017 1137   BILITOT 0.3 11/11/2017 1137   BILIDIR 0.1 06/23/2012 0825   IBILI 0.3 06/23/2012 0825      Component Value Date/Time   TSH 1.570 11/11/2017 1137   TSH 1.656 12/22/2013 1142   TSH 1.901 06/23/2012 0825  Results for Patricia [name], Patricia [name] (MRN [id number]) as of 11/26/2017 12:26  Ref. Range 11/11/2017 11:37  Vitamin D, 25-Hydroxy Latest Ref Range: 30.0 - 100.0 ng/mL 40.0    ASSESSMENT AND PLAN: Vitamin D deficiency - Plan: Vitamin D, Ergocalciferol, (DRISDOL) 50000 units CAPS capsule  Essential hypertension  At risk for diabetes mellitus  Class 3 severe obesity with serious comorbidity and body mass index (BMI) of 40.0 to 44.9 in adult, unspecified obesity type ([place])  PLAN:  Vitamin D Deficiency Patricia [name] was informed that low vitamin D levels contributes to fatigue and are associated with obesity, breast, and colon cancer. Anistyn agrees to start prescription Vit D @50 ,000 IU every week #4 with no refills. She will follow up for routine testing of vitamin D, at least 2-3 times per year. She was informed  of the risk of over-replacement of vitamin D and agrees to not increase her dose unless she discusses this with [place] first. Patricia [name] agrees to follow up with our clinic in 2 weeks.  Hypertension We discussed sodium restriction, working on healthy weight loss, and a regular exercise program as the means to achieve improved blood pressure control. Patricia [name] agreed with this plan and agreed to follow up as directed. We will continue to monitor her blood pressure as well as her progress with the above lifestyle modifications. She will continue to take her anti-hypertensive medications (losartan-hydrochlorothiazide) and will watch for signs of hypotension as she continues her lifestyle modifications. Patricia [name] agrees to follow up with our clinic in 2 weeks.  Diabetes risk counselling Patricia [name] was given extended (15 minutes) diabetes prevention counseling today. She is 52 y.o. female and has risk factors for diabetes including obesity. We discussed intensive lifestyle modifications today with an emphasis on weight loss as well as increasing exercise and decreasing simple carbohydrates in her diet.  Obesity Patricia [name] is currently in the action stage of change. As such, her goal is to continue with weight loss efforts She has agreed to follow the Category 2 plan Patricia [name] has been instructed to work up to a goal of 150 minutes of combined cardio and strengthening exercise per week or will continue to go to the gym 3 times per week for 30 mintues for weight loss and overall health benefits. We discussed the following Behavioral Modification Strategies today: increasing lean protein intake, increasing vegetables, dealing with family or coworker sabotage, and increase H20 intake Patricia [name] will continue Category 2, reinforced getting in all her protein and eating protein first.  Patricia [name] has agreed to follow up with our clinic in 2 weeks. She was informed of the importance of frequent follow up visits to maximize her success with  intensive lifestyle modifications for her multiple health conditions.   OBESITY BEHAVIORAL INTERVENTION VISIT  Today's  visit was # 2   Starting weight: 237 lbs Starting date: 11/11/17 Today's weight : 235 lbs  Today's date: 11/26/2017 Total lbs lost to date: 2  We discussed the following behavioral interventions at this visit.   ASK: We discussed the diagnosis of obesity with Patricia [name] today and Patricia [name] agreed to give [place] permission to discuss obesity behavioral modification therapy today.  ASSESS: Patricia [name] has the diagnosis of obesity and her BMI today is 42.97 Patricia [name] is in the action stage of change   ADVISE: Keira was educated on the multiple health risks of obesity as well as the benefit of weight loss to improve her health. She was advised of the need for long term treatment and the importance of lifestyle modifications to improve her current health and to decrease her risk of future health problems.  AGREE: Multiple dietary modification options and treatment options were discussed and  Patricia [name] agreed to follow the recommendations documented in the above note.  ARRANGE: Patricia [name] was educated on the importance of frequent visits to treat obesity as outlined per CMS and USPSTF guidelines and agreed to schedule her next follow up appointment today.  Wilhemena Durie, am acting as transcriptionist for [employer], DO  I have reviewed the above documentation for accuracy and completeness, and I agree with the above. -Jearld Lesch, DO

## 2017-12-12 ENCOUNTER — Ambulatory Visit (INDEPENDENT_AMBULATORY_CARE_PROVIDER_SITE_OTHER): Payer: Self-pay | Admitting: Bariatrics

## 2017-12-17 ENCOUNTER — Encounter (INDEPENDENT_AMBULATORY_CARE_PROVIDER_SITE_OTHER): Payer: Self-pay | Admitting: Bariatrics

## 2017-12-17 ENCOUNTER — Ambulatory Visit (INDEPENDENT_AMBULATORY_CARE_PROVIDER_SITE_OTHER): Payer: 59 | Admitting: Bariatrics

## 2017-12-17 VITALS — BP 115/73 | HR 64 | Temp 98.5°F | Ht 62.0 in | Wt 237.0 lb

## 2017-12-17 DIAGNOSIS — E559 Vitamin D deficiency, unspecified: Secondary | ICD-10-CM | POA: Diagnosis not present

## 2017-12-17 DIAGNOSIS — I10 Essential (primary) hypertension: Secondary | ICD-10-CM | POA: Diagnosis not present

## 2017-12-17 DIAGNOSIS — E8881 Metabolic syndrome: Secondary | ICD-10-CM

## 2017-12-17 DIAGNOSIS — Z6841 Body Mass Index (BMI) 40.0 and over, adult: Secondary | ICD-10-CM

## 2017-12-17 DIAGNOSIS — Z9189 Other specified personal risk factors, not elsewhere classified: Secondary | ICD-10-CM | POA: Diagnosis not present

## 2017-12-17 MED ORDER — VITAMIN D (ERGOCALCIFEROL) 1.25 MG (50000 UNIT) PO CAPS: 50000.0000 [IU] | ORAL_CAPSULE | ORAL | 0 refills | Status: DC

## 2017-12-18 NOTE — Progress Notes (Signed)
Office: [phone]  /  Fax: [phone]   HPI:   Chief Complaint: OBESITY Patricia [name] is here to discuss her progress with her obesity treatment plan. She is on the  follow the Category 2 plan and is following her eating plan approximately 90 % of the time. She states she is exercising 0 minutes 0 times per week. Patricia [name] is currently struggling with traveling and decreased planning and meal planning. When she is 100% at home she is still struggles with getting in all of her food. She denies struggles with snacking and polyphagia.  Her weight is 237 lb (107.5 kg) today and has not lost weight since her last visit. She has lost 0 lbs since starting treatment with [place].  Hypertension Patricia [name] is a 52 y.o. female with hypertension.  Patricia [name] denies chest pain, lightheadedness, hypotension or shortness of breath on exertion. She is working weight loss to help control her blood [name] with the goal of decreasing her risk of heart attack and stroke. Patricia [name] is currently controlled.  Vitamin D deficiency Patricia [name] has a diagnosis of vitamin D deficiency. She is currently taking vit D and denies nausea, vomiting or muscle weakness.   Ref. Range 11/11/2017 11:37  Vitamin D, 25-Hydroxy Latest Ref Range: 30.0 - 100.0 ng/mL 40.0   Pre-Diabetes Patricia [name] has a diagnosis of prediabetes based on her elevated HgA1c 5.8 and insulin of 14.5 and was informed this puts her at greater risk of developing diabetes. She is not taking metformin currently and continues to work on diet and exercise to decrease risk of diabetes. She denies nausea, polydipsia, polyuria, or hypoglycemia.  At risk for osteopenia and osteoporosis Patricia [name] is at higher risk of osteopenia and osteoporosis due to vitamin D deficiency.   ALLERGIES: No Known Allergies  MEDICATIONS: Current Outpatient Medications on File Prior to Visit  Medication Sig Dispense Refill  . Elastic Bandages & Supports (KNEE COMPRESSION  SLEEVE/L/XL) MISC 1 Device by Does not apply route daily. 1 each 0  . losartan-hydrochlorothiazide (HYZAAR) 100-12.5 MG tablet TAKE 1 TABLET BY MOUTH EVERY DAY 30 tablet 5  . Multiple Vitamin (MULTIVITAMIN) capsule Take 1 capsule by mouth daily.    Marland Kitchen omeprazole (PRILOSEC) 40 MG capsule TAKE 1 CAPSULE BY MOUTH ONCE A DAY 90 capsule 3  . potassium chloride SA (KLOR-CON M20) 20 MEQ tablet Take 1 tablet (20 mEq total) by mouth daily. 90 tablet 2  . Probiotic Product (PROBIOTIC DAILY PO) Take 1 tablet by mouth daily.     Current Facility-Administered Medications on File Prior to Visit  Medication Dose Route Frequency Provider Last Rate Last Dose  . 0.9 %  sodium chloride infusion  500 mL Intravenous Once Gatha Mayer, MD        PAST MEDICAL HISTORY: Past Medical History:  Diagnosis Date  . Arthritis    "spine-DDD-intermittent sciatic pain"  . Chronic cervicitis    resolved  . Diabetes mellitus without complication (HCC)    diet controlled  . GERD (gastroesophageal reflux disease)   . Heart murmur    "benign"-routinely sees yearly - Dr. Deretha Emory, cardiology  . History of kidney stones    multiple kidney stones-passed.  . Hypertension   . IBS (irritable bowel syndrome)    controlled with diet  . Menorrhagia    resolved s/p Hysterectomy  . Obesity   . Sleep apnea    cpap used nightly  . Vitamin D deficiency     PAST SURGICAL HISTORY: Past Surgical  History:  Procedure Laterality Date  . BREAST LUMPECTOMY Left    benign hamartoma  . CESAREAN SECTION     x1  . COLONOSCOPY  10/19/2005  . ESOPHAGOGASTRODUODENOSCOPY  12/21/2008  . HYSTEROSCOPY W/D&C     resection of endometrial polyp  . LAPAROSCOPIC GASTRIC SLEEVE RESECTION N/A 03/12/2016   Procedure: LAPAROSCOPIC GASTRIC SLEEVE RESECTION WITH UPPER ENDO;  Surgeon: Arta Bruce Kinsinger, MD;  Location: [facility];  Service: General;  Laterality: N/A;  . LAPAROSCOPIC GASTRIC SLEEVE RESECTION  03/22/2016  . POLYPECTOMY      uterus  . TUBAL LIGATION    . uterine ablation    . Uterine Polyps Removed    . VAGINAL HYSTERECTOMY     fibroids    SOCIAL HISTORY: Social History   Tobacco Use  . Smoking status: Never Smoker  . Smokeless tobacco: Never Used  Substance Use Topics  . Alcohol use: Yes    Alcohol/week: 0.0 standard drinks    Comment: socially  . Drug use: No    FAMILY HISTORY: Family History  Problem Relation Age of Onset  . Hypertension Father   . Hypertension Mother   . Diabetes Mother   . Coronary artery disease Mother   . Sudden death Mother   . Cancer Brother        carcinoid tumer of the appendix  . Colon cancer Neg Hx   . Esophageal cancer Neg Hx   . Stomach cancer Neg Hx   . Rectal cancer Neg Hx     ROS: Review of Systems  Constitutional: Negative for weight loss.  Respiratory: Negative for shortness of breath.   Cardiovascular: Negative for chest pain.       Negative for hypotension   Gastrointestinal: Negative for nausea and vomiting.  Musculoskeletal:       Negative for muscle weakness  Neurological:       Negative for lightheadedness   Endo/Heme/Allergies: Negative for polydipsia.       Negative for polyuria and hypoglycemia    PHYSICAL EXAM: Blood [name] 115/73, pulse 64, temperature 98.5 F (36.9 C), temperature source Oral, height 5\' 2"  (1.575 m), weight 237 lb (107.5 kg), SpO2 98 %. Body mass index is 43.35 kg/m. Physical Exam  Constitutional: She is oriented to person, place, and time. She appears well-developed.  HENT:  Head: Normocephalic.  Cardiovascular: Normal rate.  Pulmonary/Chest: Effort normal.  Musculoskeletal: Normal range of motion.  Neurological: She is oriented to person, place, and time.  Skin: Skin is warm and dry.  Psychiatric: She has a normal mood and affect. Her behavior is normal.  Vitals reviewed.   RECENT LABS AND TESTS: BMET    Component Value Date/Time   NA 138 11/11/2017 1137   K 4.3 11/11/2017 1137   CL 105  11/11/2017 1137   CO2 26 11/11/2017 1137   GLUCOSE 84 11/11/2017 1137   GLUCOSE 88 08/01/2017 0955   BUN 11 11/11/2017 1137   CREATININE 0.60 11/11/2017 1137   CREATININE 0.62 08/01/2017 0955   CALCIUM 9.6 11/11/2017 1137   GFRNONAA 107 11/11/2017 1137   GFRNONAA 105 08/01/2017 0955   GFRAA 123 11/11/2017 1137   GFRAA 122 08/01/2017 0955   Lab Results  Component Value Date   HGBA1C 5.8 (H) 11/11/2017   HGBA1C 5.5 08/01/2017   HGBA1C 6.0 (H) 03/06/2016   HGBA1C 6.0 (H) 07/02/2014   HGBA1C 5.6 11/27/2011   Lab Results  Component Value Date   INSULIN 14.6 11/11/2017   CBC  Component Value Date/Time   WBC 4.3 08/01/2017 0955   RBC 4.69 08/01/2017 0955   HGB 13.6 08/01/2017 0955   HCT 40.2 08/01/2017 0955   PLT 254 08/01/2017 0955   MCV 85.7 08/01/2017 0955   MCH 29.0 08/01/2017 0955   MCHC 33.8 08/01/2017 0955   RDW 12.8 08/01/2017 0955   LYMPHSABS 1,656 08/01/2017 0955   MONOABS 0.3 03/13/2016 0436   EOSABS 69 08/01/2017 0955   BASOSABS 30 08/01/2017 0955   Iron/TIBC/Ferritin/ %Sat No results found for: IRON, TIBC, FERRITIN, IRONPCTSAT Lipid Panel     Component Value Date/Time   CHOL 204 (H) 11/11/2017 1137   TRIG 113 11/11/2017 1137   HDL 74 11/11/2017 1137   CHOLHDL 3.0 08/01/2017 0955   VLDL 16 12/22/2013 1142   LDLCALC 107 (H) 11/11/2017 1137   LDLCALC 115 (H) 08/01/2017 0955   Hepatic Function Panel     Component Value Date/Time   PROT 6.6 11/11/2017 1137   ALBUMIN 4.1 11/11/2017 1137   AST 14 11/11/2017 1137   ALT 12 11/11/2017 1137   ALKPHOS 96 11/11/2017 1137   BILITOT 0.3 11/11/2017 1137   BILIDIR 0.1 06/23/2012 0825   IBILI 0.3 06/23/2012 0825      Component Value Date/Time   TSH 1.570 11/11/2017 1137   TSH 1.656 12/22/2013 1142   TSH 1.901 06/23/2012 0825    Ref. Range 11/11/2017 11:37  Vitamin D, 25-Hydroxy Latest Ref Range: 30.0 - 100.0 ng/mL 40.0    ASSESSMENT AND PLAN: Essential hypertension  Vitamin D deficiency - Plan:  Vitamin D, Ergocalciferol, (DRISDOL) 50000 units CAPS capsule  Insulin resistance  At risk for osteoporosis  Class 3 severe obesity with serious comorbidity and body mass index (BMI) of 40.0 to 44.9 in adult, unspecified obesity type ([place])  PLAN: Hypertension We discussed sodium restriction, working on healthy weight loss, and a regular exercise program as the means to achieve improved blood [name] control. Avika agreed with this plan and agreed to follow up as directed. We will continue to monitor her blood [name] as well as her progress with the above lifestyle modifications. She will continue her losartan-hctz as prescribed and will watch for signs of hypotension as she continues her lifestyle modifications.  Vitamin D Deficiency Dorothey was informed that low vitamin D levels contributes to fatigue and are associated with obesity, breast, and colon cancer. She agrees to continue to take prescription Vit D @50 ,000 IU every week #4 with no refills and will follow up for routine testing of vitamin D, at least 2-3 times per year. She was informed of the risk of over-replacement of vitamin D and agrees to not increase her dose unless she discusses this with [place] first. She agrees to follow up in our clinic as directed.   Insulin Resistance Patricia [name] will continue to work on weight loss, exercise, and decreasing simple carbohydrates in her diet to help decrease the risk of diabetes. We dicussed metformin including benefits and risks. She was informed that eating too many simple carbohydrates or too many calories at one sitting increases the likelihood of GI side effects. Patricia [name] declined metformin for now, as we do not want to suppress her appetite and prescription was not written today. Discussed with her to decrease intake of carbohydrates and increase protein. Patricia [name] agreed to follow up with [place] as directed to monitor her progress.   At risk for osteopenia and osteoporosis Patricia [name] was given extended   (15 minutes) osteoporosis prevention counseling today. Patricia [name] is at risk for  osteopenia and osteoporosis due to her vitamin D deficiency. She was encouraged to take her vitamin D and follow her higher calcium diet and increase strengthening exercise to help strengthen her bones and decrease her risk of osteopenia and osteoporosis.  Obesity Patricia [name] is currently in the action stage of change. As such, her goal is to continue with weight loss efforts She has agreed to follow the Category 2 plan Patricia [name] has been instructed to work up to a goal of 150 minutes of combined cardio and strengthening exercise per week for weight loss and overall health benefits. Discussed a plan for traveling. She agrees to increase walking and 15 minutes of resistance.  We discussed the following Behavioral Modification Strategies today: increasing lean protein intake, decreasing simple carbohydrates , increasing vegetables, decrease ETOH,  increasing water intake, decreasing liquid calories, no skipping meals, meal planning and cooking strategies, keeping healthy foods in the home.    Patricia [name] has agreed to follow up with our clinic in 2 weeks. She was informed of the importance of frequent follow up visits to maximize her success with intensive lifestyle modifications for her multiple health conditions.   OBESITY BEHAVIORAL INTERVENTION VISIT  Today's visit was # 3   Starting weight: 237 lb Starting date: 11/11/17 Today's weight : 237 lb Today's date: 12/17/17 Total lbs lost to date: 0    ASK: We discussed the diagnosis of obesity with Patricia [name] today and Patricia [name] agreed to give [place] permission to discuss obesity behavioral modification therapy today.  ASSESS: Annasofia has the diagnosis of obesity and her BMI today is 43.34 Patricia [name] is in the action stage of change   ADVISE: Patricia [name] was educated on the multiple health risks of obesity as well as the benefit of weight loss to improve her health. She was advised of  the need for long term treatment and the importance of lifestyle modifications to improve her current health and to decrease her risk of future health problems.  AGREE: Multiple dietary modification options and treatment options were discussed and  Patricia [name] agreed to follow the recommendations documented in the above note.  ARRANGE: Patricia [name] was educated on the importance of frequent visits to treat obesity as outlined per CMS and USPSTF guidelines and agreed to schedule her next follow up appointment today.  Leary Roca, am acting as transcriptionist for [employer], DO   I have reviewed the above documentation for accuracy and completeness, and I agree with the above. -Jearld Lesch, DO

## 2017-12-27 ENCOUNTER — Encounter (INDEPENDENT_AMBULATORY_CARE_PROVIDER_SITE_OTHER): Payer: Self-pay | Admitting: Bariatrics

## 2017-12-27 ENCOUNTER — Other Ambulatory Visit: Payer: Self-pay | Admitting: Family Medicine

## 2017-12-27 MED ORDER — HYDROCHLOROTHIAZIDE 12.5 MG PO CAPS: 12.5000 mg | ORAL_CAPSULE | Freq: Every day | ORAL | 3 refills | Status: DC

## 2017-12-27 MED ORDER — LOSARTAN POTASSIUM 100 MG PO TABS: 100.0000 mg | ORAL_TABLET | Freq: Every day | ORAL | 3 refills | Status: DC

## 2017-12-31 ENCOUNTER — Encounter (INDEPENDENT_AMBULATORY_CARE_PROVIDER_SITE_OTHER): Payer: Self-pay

## 2017-12-31 ENCOUNTER — Ambulatory Visit (INDEPENDENT_AMBULATORY_CARE_PROVIDER_SITE_OTHER): Payer: 59 | Admitting: Bariatrics

## 2018-01-14 ENCOUNTER — Ambulatory Visit (INDEPENDENT_AMBULATORY_CARE_PROVIDER_SITE_OTHER): Payer: 59

## 2018-01-14 DIAGNOSIS — Z23 Encounter for immunization: Secondary | ICD-10-CM | POA: Diagnosis not present

## 2018-01-14 NOTE — Progress Notes (Signed)
Patient was in office for flu vaccine.Patient received flu injection in her left deltoid PatienT tolerated well

## 2018-02-10 ENCOUNTER — Encounter: Payer: Self-pay | Admitting: Family Medicine

## 2018-02-10 DIAGNOSIS — Z1231 Encounter for screening mammogram for malignant neoplasm of breast: Secondary | ICD-10-CM | POA: Diagnosis not present

## 2018-02-10 LAB — HM MAMMOGRAPHY

## 2018-03-27 ENCOUNTER — Encounter: Payer: Self-pay | Admitting: *Deleted

## 2018-04-22 ENCOUNTER — Other Ambulatory Visit: Payer: Self-pay | Admitting: Family Medicine

## 2018-04-28 ENCOUNTER — Encounter: Payer: Self-pay | Admitting: Family Medicine

## 2018-04-28 ENCOUNTER — Ambulatory Visit: Payer: 59 | Admitting: Family Medicine

## 2018-04-28 VITALS — BP 108/72 | HR 70 | Temp 97.8°F | Resp 16 | Ht 61.5 in | Wt 247.0 lb

## 2018-04-28 DIAGNOSIS — G5761 Lesion of plantar nerve, right lower limb: Secondary | ICD-10-CM

## 2018-04-28 DIAGNOSIS — H6122 Impacted cerumen, left ear: Secondary | ICD-10-CM | POA: Diagnosis not present

## 2018-04-28 MED ORDER — DICLOFENAC SODIUM 1 % TD GEL: 2.0000 g | Freq: Four times a day (QID) | TRANSDERMAL | 1 refills | Status: DC

## 2018-04-28 NOTE — Progress Notes (Signed)
Subjective:    Patient ID: Patricia [name], female    DOB: [birth year], 53 y.o.   MRN: [id number]  HPI  Patient reports sudden hearing loss in her left ear.  It feels stopped up.  She denies any dizziness.  She denies any otalgia.  Symptoms began about 2 weeks ago.  She denies any sinus pain.  There are no problems in her right ear.  Examination of her right auditory canal is completely clear with a normal healthy appearing tympanic membrane.  Examination of the left auditory canal reveals cerumen impaction completely obscuring the left tympanic membrane.  She also reports several weeks of sharp stabbing pain in her fifth MTP joint shooting down her lateral aspect of her fifth toe.  There is no erythema.  There is no swelling.  There is no warmth.  She has full range of motion of the MTP joint however palpation on the side of the toe elicits severe almost nervelike pain.  There is a callus in the area where she is feeling the pain and makes me suspect that there may be compression from her shoes against the interdigital nerve causing a neuroma similar to a Morton's neuroma.  However she has a history of a bad reaction to a cortisone injection and therefore she does not want to try a cortisone injection in this area.  Past Medical History:  Diagnosis Date  . Arthritis    "spine-DDD-intermittent sciatic pain"  . Chronic cervicitis    resolved  . Diabetes mellitus without complication (HCC)    diet controlled  . GERD (gastroesophageal reflux disease)   . Heart murmur    "benign"-routinely sees yearly - Dr. Deretha Emory, cardiology  . History of kidney stones    multiple kidney stones-passed.  . Hypertension   . IBS (irritable bowel syndrome)    controlled with diet  . Menorrhagia    resolved s/p Hysterectomy  . Obesity   . Sleep apnea    cpap used nightly  . Vitamin D deficiency    Past Surgical History:  Procedure Laterality Date  . BREAST LUMPECTOMY Left    benign hamartoma  .  CESAREAN SECTION     x1  . COLONOSCOPY  10/19/2005  . ESOPHAGOGASTRODUODENOSCOPY  12/21/2008  . HYSTEROSCOPY W/D&C     resection of endometrial polyp  . LAPAROSCOPIC GASTRIC SLEEVE RESECTION N/A 03/12/2016   Procedure: LAPAROSCOPIC GASTRIC SLEEVE RESECTION WITH UPPER ENDO;  Surgeon: Arta Bruce Kinsinger, MD;  Location: [facility];  Service: General;  Laterality: N/A;  . LAPAROSCOPIC GASTRIC SLEEVE RESECTION  03/22/2016  . POLYPECTOMY     uterus  . TUBAL LIGATION    . uterine ablation    . Uterine Polyps Removed    . VAGINAL HYSTERECTOMY     fibroids   Current Outpatient Medications on File Prior to Visit  Medication Sig Dispense Refill  . losartan (COZAAR) 50 MG tablet Take 50 mg by mouth daily.    . Multiple Vitamin (MULTIVITAMIN) capsule Take 1 capsule by mouth daily.    Marland Kitchen omeprazole (PRILOSEC) 40 MG capsule TAKE 1 CAPSULE BY MOUTH EVERY DAY 30 capsule 11  . potassium chloride SA (KLOR-CON M20) 20 MEQ tablet Take 1 tablet (20 mEq total) by mouth daily. 90 tablet 2  . Probiotic Product (PROBIOTIC DAILY PO) Take 1 tablet by mouth daily.     Current Facility-Administered Medications on File Prior to Visit  Medication Dose Route Frequency Provider Last Rate Last Dose  . 0.9 %  sodium chloride infusion  500 mL Intravenous Once Gatha Mayer, MD       No Known Allergies Social History   Socioeconomic History  . Marital status: Married    Spouse name: Montine Circle  . Number of children: 2  . Years of education: 20  . Highest education level: Not on file  Occupational History  . Occupation: QUALITY ANALYST    Employer: [employer]  . Financial resource strain: Not on file  . Food insecurity:    Worry: Not on file    Inability: Not on file  . Transportation needs:    Medical: Not on file    Non-medical: Not on file  Tobacco Use  . Smoking status: Never Smoker  . Smokeless tobacco: Never Used  Substance and Sexual Activity  . Alcohol use: Yes    Alcohol/week:  0.0 standard drinks    Comment: socially  . Drug use: No  . Sexual activity: Yes    Birth control/protection: Surgical  Lifestyle  . Physical activity:    Days per week: Not on file    Minutes per session: Not on file  . Stress: Not on file  Relationships  . Social connections:    Talks on phone: Not on file    Gets together: Not on file    Attends religious service: Not on file    Active member of club or organization: Not on file    Attends meetings of clubs or organizations: Not on file    Relationship status: Not on file  . Intimate partner violence:    Fear of current or ex partner: Not on file    Emotionally abused: Not on file    Physically abused: Not on file    Forced sexual activity: Not on file  Other Topics Concern  . Not on file  Social History Narrative   Patient is right handed and consumes green tea daily      Review of Systems  All other systems reviewed and are negative.      Objective:   Physical Exam  Constitutional: She appears well-developed and well-nourished. No distress.  HENT:  Right Ear: External ear normal.  Left Ear: External ear normal.  Nose: Nose normal.  Mouth/Throat: Oropharynx is clear and moist. No oropharyngeal exudate.  Cardiovascular: Normal rate, regular rhythm and normal heart sounds.  No murmur heard. Pulmonary/Chest: Effort normal and breath sounds normal. No respiratory distress. She has no wheezes. She has no rales.  Musculoskeletal:     Right foot: Normal range of motion. Tenderness present. No bony tenderness, swelling, crepitus, deformity or laceration.       Feet:  Skin: Skin is warm. She is not diaphoretic. No erythema.  Vitals reviewed.         Assessment & Plan:  Hearing loss due to cerumen impaction.  Cerumen impaction was removed with irrigation and lavage.  Symptoms improved.  No further follow-up is necessary for this.  I believe the toe pain is likely due to a neuroma from compression.  I recommended  switching to a wider toe box.  Meanwhile use Voltaren gel 2 g applied 4 times daily for 1 to 2 weeks.  If not improving we could either try prednisone taper pack or cortisone injection in that area although her previous allergic reaction has been hesitant to do this.

## 2018-05-28 ENCOUNTER — Encounter: Payer: Self-pay | Admitting: Family Medicine

## 2018-05-28 ENCOUNTER — Ambulatory Visit: Payer: 59 | Admitting: Family Medicine

## 2018-05-28 VITALS — BP 116/74 | HR 62 | Temp 98.2°F | Wt 248.1 lb

## 2018-05-28 DIAGNOSIS — J0110 Acute frontal sinusitis, unspecified: Secondary | ICD-10-CM

## 2018-05-28 MED ORDER — HYDROCODONE-GUAIFENESIN 2.5-200 MG/5ML PO SOLN: 10.0000 mL | Freq: Every day | ORAL | 0 refills | Status: DC

## 2018-05-28 MED ORDER — FLUTICASONE PROPIONATE 50 MCG/ACT NA SUSP: 2.0000 | Freq: Every day | NASAL | 6 refills | Status: DC

## 2018-05-28 MED ORDER — AMOXICILLIN-POT CLAVULANATE 875-125 MG PO TABS: 1.0000 | ORAL_TABLET | Freq: Two times a day (BID) | ORAL | 0 refills | Status: AC

## 2018-05-28 NOTE — Patient Instructions (Addendum)
Get and take flonase or nasonex and do two sprays in each nostril daily for the next 2-3 weeks. Get an antihistamine and also take daily Get and take Sudafed - get from pharmacist Use cough meds sparingly  Hold the antibiotics and only take if the severe pain returns/worsens/is accompanied by fever.  If you start the antibiotics then you must stop the sudafed.    Sinusitis, Adult Sinusitis is soreness and swelling (inflammation) of your sinuses. Sinuses are hollow spaces in the bones around your face. They are located:  Around your eyes.  In the middle of your forehead.  Behind your nose.  In your cheekbones. Your sinuses and nasal passages are lined with a fluid called mucus. Mucus drains out of your sinuses. Swelling can trap mucus in your sinuses. This lets germs (bacteria, virus, or fungus) grow, which leads to infection. Most of the time, this condition is caused by a virus. What are the causes? This condition is caused by:  Allergies.  Asthma.  Germs.  Things that block your nose or sinuses.  Growths in the nose (nasal polyps).  Chemicals or irritants in the air.  Fungus (rare). What increases the risk? You are more likely to develop this condition if:  You have a weak body defense system (immune system).  You do a lot of swimming or diving.  You use nasal sprays too much.  You smoke. What are the signs or symptoms? The main symptoms of this condition are pain and a feeling of pressure around the sinuses. Other symptoms include:  Stuffy nose (congestion).  Runny nose (drainage).  Swelling and warmth in the sinuses.  Headache.  Toothache.  A cough that may get worse at night.  Mucus that collects in the throat or the back of the nose (postnasal drip).  Being unable to smell and taste.  Being very tired (fatigue).  A fever.  Sore throat.  Bad breath. How is this diagnosed? This condition is diagnosed based on:  Your symptoms.  Your  medical history.  A physical exam.  Tests to find out if your condition is short-term (acute) or long-term (chronic). Your doctor may: ? Check your nose for growths (polyps). ? Check your sinuses using a tool that has a light (endoscope). ? Check for allergies or germs. ? Do imaging tests, such as an MRI or CT scan. How is this treated? Treatment for this condition depends on the cause and whether it is short-term or long-term.  If caused by a virus, your symptoms should go away on their own within 10 days. You may be given medicines to relieve symptoms. They include: ? Medicines that shrink swollen tissue in the nose. ? Medicines that treat allergies (antihistamines). ? A spray that treats swelling of the nostrils. ? Rinses that help get rid of thick mucus in your nose (nasal saline washes).  If caused by bacteria, your doctor may wait to see if you will get better without treatment. You may be given antibiotic medicine if you have: ? A very bad infection. ? A weak body defense system.  If caused by growths in the nose, you may need to have surgery. Follow these instructions at home: Medicines  Take, use, or apply over-the-counter and prescription medicines only as told by your doctor. These may include nasal sprays.  If you were prescribed an antibiotic medicine, take it as told by your doctor. Do not stop taking the antibiotic even if you start to feel better. Hydrate and humidify  Drink enough water to keep your pee (urine) pale yellow.  Use a cool mist humidifier to keep the humidity level in your home above 50%.  Breathe in steam for 10-15 minutes, 3-4 times a day, or as told by your doctor. You can do this in the bathroom while a hot shower is running.  Try not to spend time in cool or dry air. Rest  Rest as much as you can.  Sleep with your head raised (elevated).  Make sure you get enough sleep each night. General instructions   Put a warm, moist washcloth  on your face 3-4 times a day, or as often as told by your doctor. This will help with discomfort.  Wash your hands often with soap and water. If there is no soap and water, use hand sanitizer.  Do not smoke. Avoid being around people who are smoking (secondhand smoke).  Keep all follow-up visits as told by your doctor. This is important. Contact a doctor if:  You have a fever.  Your symptoms get worse.  Your symptoms do not get better within 10 days. Get help right away if:  You have a very bad headache.  You cannot stop throwing up (vomiting).  You have very bad pain or swelling around your face or eyes.  You have trouble seeing.  You feel confused.  Your neck is stiff.  You have trouble breathing. Summary  Sinusitis is swelling of your sinuses. Sinuses are hollow spaces in the bones around your face.  This condition is caused by tissues in your nose that become inflamed or swollen. This traps germs. These can lead to infection.  If you were prescribed an antibiotic medicine, take it as told by your doctor. Do not stop taking it even if you start to feel better.  Keep all follow-up visits as told by your doctor. This is important. This information is not intended to replace advice given to you by your health care provider. Make sure you discuss any questions you have with your health care provider. Document Released: 08/29/2007 Document Revised: 08/12/2017 Document Reviewed: 08/12/2017 Elsevier Interactive Patient Education  2019 [employer].

## 2018-05-28 NOTE — Progress Notes (Signed)
Patient ID: Patricia [name], female    DOB: Apr 28, 1965, 53 y.o.   MRN: [id number]  PCP: Susy Frizzle, MD  Chief Complaint  Patient presents with  . Sinusitis    Patient has c/o of headche, post nasal drip, sore throat. Onset Sunday    Subjective:   Patricia [name] is a 53 y.o. female, presents to clinic with CC of sinus pain pressure, nasal drainage, facial pressure and severe  Severe pain and nose bleed was yesterday. Hasn't been able to wear CPAP it is burning like a fire This AM severe HA "it was unbelievable" painful Took ibuprofen this am and pain is now 7/10. Drainage has been mostly clear, it feels thicker in the back of her throat, no cough, fever, chills, sweats    Patient Active Problem List   Diagnosis Date Noted  . Obesity 03/12/2016  . Palpitations 07/07/2013  . Acute sinusitis 04/21/2013  . Obstructive sleep apnea (adult) (pediatric) 08/25/2012  . Hypersomnia with sleep apnea, unspecified 08/25/2012  . Bilateral lower extremity edema 11/27/2011  . Obesity, Class III, BMI 40-49.9 (morbid obesity) ([place]) 10/26/2011  . Essential hypertension 11/28/2008  . IRRITABLE BOWEL SYNDROME - diarrhea predominant 11/10/2008     Prior to Admission medications   Medication Sig Start Date End Date Taking? Authorizing Provider  diclofenac sodium (VOLTAREN) 1 % GEL Apply 2 g topically 4 (four) times daily. 04/28/18  Yes Susy Frizzle, MD  losartan (COZAAR) 50 MG tablet Take 50 mg by mouth daily.   Yes [provider]  Multiple Vitamin (MULTIVITAMIN) capsule Take 1 capsule by mouth daily.   Yes [provider]  omeprazole (PRILOSEC) 40 MG capsule TAKE 1 CAPSULE BY MOUTH EVERY DAY 04/22/18  Yes Susy Frizzle, MD  potassium chloride SA (KLOR-CON M20) 20 MEQ tablet Take 1 tablet (20 mEq total) by mouth daily. 09/24/17  Yes Susy Frizzle, MD  Probiotic Product (PROBIOTIC DAILY PO) Take 1 tablet by mouth daily.   Yes [provider]     No  Known Allergies   Family History  Problem Relation Age of Onset  . Hypertension Father   . Hypertension Mother   . Diabetes Mother   . Coronary artery disease Mother   . Sudden death Mother   . Cancer Brother        carcinoid tumer of the appendix  . Colon cancer Neg Hx   . Esophageal cancer Neg Hx   . Stomach cancer Neg Hx   . Rectal cancer Neg Hx      Social History   Socioeconomic History  . Marital status: Married    Spouse name: Montine Circle  . Number of children: 2  . Years of education: 12  . Highest education level: Not on file  Occupational History  . Occupation: QUALITY ANALYST    Employer: [employer]  . Financial resource strain: Not on file  . Food insecurity:    Worry: Not on file    Inability: Not on file  . Transportation needs:    Medical: Not on file    Non-medical: Not on file  Tobacco Use  . Smoking status: Never Smoker  . Smokeless tobacco: Never Used  Substance and Sexual Activity  . Alcohol use: Yes    Alcohol/week: 0.0 standard drinks    Comment: socially  . Drug use: No  . Sexual activity: Yes    Birth control/protection: Surgical  Lifestyle  . Physical activity:  Days per week: Not on file    Minutes per session: Not on file  . Stress: Not on file  Relationships  . Social connections:    Talks on phone: Not on file    Gets together: Not on file    Attends religious service: Not on file    Active member of club or organization: Not on file    Attends meetings of clubs or organizations: Not on file    Relationship status: Not on file  . Intimate partner violence:    Fear of current or ex partner: Not on file    Emotionally abused: Not on file    Physically abused: Not on file    Forced sexual activity: Not on file  Other Topics Concern  . Not on file  Social History Narrative   Patient is right handed and consumes green tea daily     Review of Systems  Constitutional: Negative.   HENT: Negative.     Eyes: Negative.   Respiratory: Negative.   Cardiovascular: Negative.   Gastrointestinal: Negative.   Endocrine: Negative.   Genitourinary: Negative.   Musculoskeletal: Negative.   Skin: Negative.   Allergic/Immunologic: Negative.   Neurological: Negative.   Hematological: Negative.   Psychiatric/Behavioral: Negative.   All other systems reviewed and are negative.      Objective:    Vitals:   05/28/18 1207  BP: 116/74  Pulse: 62  Temp: 98.2 F (36.8 C)  TempSrc: Oral  SpO2: 98%  Weight: 248 lb 2 oz (112.5 kg)      Physical Exam Vitals signs and nursing note reviewed.  Constitutional:      General: She is not in acute distress.    Appearance: She is well-developed. She is obese. She is not ill-appearing, toxic-appearing or diaphoretic.  HENT:     Head: Normocephalic and atraumatic.     Right Ear: Hearing, tympanic membrane, ear canal and external ear normal.     Left Ear: Hearing, tympanic membrane, ear canal and external ear normal.     Nose: Mucosal edema, congestion and rhinorrhea present. No nasal deformity or nasal tenderness.     Right Nostril: Occlusion present.     Right Turbinates: Enlarged and swollen.     Left Turbinates: Enlarged and swollen.     Right Sinus: No maxillary sinus tenderness or frontal sinus tenderness.     Left Sinus: No maxillary sinus tenderness or frontal sinus tenderness.     Mouth/Throat:     Mouth: Mucous membranes are moist. Mucous membranes are not pale.     Pharynx: Uvula midline. No oropharyngeal exudate, posterior oropharyngeal erythema or uvula swelling.     Tonsils: No tonsillar exudate or tonsillar abscesses. Swelling: 0 on the right. 0 on the left.  Eyes:     General:        Right eye: No discharge.        Left eye: No discharge.     Conjunctiva/sclera: Conjunctivae normal.     Pupils: Pupils are equal, round, and reactive to light.  Neck:     Musculoskeletal: Normal range of motion and neck supple.     Trachea: No  tracheal deviation.  Cardiovascular:     Rate and Rhythm: Normal rate and regular rhythm.     Heart sounds: Normal heart sounds.  Pulmonary:     Effort: Pulmonary effort is normal. No respiratory distress.     Breath sounds: No stridor. No wheezing, rhonchi or rales.  Abdominal:  General: Bowel sounds are normal. There is no distension.     Palpations: Abdomen is soft.  Musculoskeletal: Normal range of motion.  Skin:    General: Skin is warm and dry.     Coloration: Skin is not pale.     Findings: No rash.  Neurological:     Mental Status: She is alert.     Motor: No abnormal muscle tone.     Coordination: Coordination normal.  Psychiatric:        Behavior: Behavior normal.           Assessment & Plan:      ICD-10-CM   1. Acute non-recurrent frontal sinusitis J01.10     4-5 days of uri sx severely worsening 2 days ago with pain/pressure and HA to right forehead and right maxillary sinus.  She is afebrile and otherwise well appearing.  No meds tried.    On exam no sinus ttp - nasal tissue is very edematous and erythematous, right nare is not patent due to slightly more swelling, left nare patent, no epistaxis.    Feel it is currently severe viral infection with some underlying sinus disease or nasal allergies.  Without tenderness on exam or fever I feel she can wait a few more days before requiring antibiotics.  She was encouraged to use local nasal steroid sprays, antihistamines, saline nasal spray, decongestants and she had mild cough but mainly she had difficulty breathing and sleeping at night with her CPAP so was given her cough syrup to try only at night so she can get some rest.  She understands indications to start the antibiotic, Augmentin, with worsening of pain with fever or prolonged symptoms that are moderate to severe beyond 8 to 10 days.  Instructed to stop Sudafed when starting antibiotic patient verbalizes understanding of this, it was written out for her and  given to her with her AVS   Delsa Grana, PA-C 05/28/18 12:23 PM

## 2018-05-29 ENCOUNTER — Ambulatory Visit: Payer: 59 | Admitting: Family Medicine

## 2018-05-29 ENCOUNTER — Telehealth: Payer: Self-pay | Admitting: Family Medicine

## 2018-05-29 MED ORDER — HYDROCODONE-HOMATROPINE 5-1.5 MG/5ML PO SYRP: 5.0000 mL | ORAL_SOLUTION | Freq: Three times a day (TID) | ORAL | 0 refills | Status: DC | PRN

## 2018-05-29 NOTE — Telephone Encounter (Signed)
Received a fax from patient's pharmacy stating that Hydrocodone Guaifienesin cough syrup is out of stock and also not covered by patient's insurance. Her cash price is $[phone]. Please advise?

## 2018-05-29 NOTE — Telephone Encounter (Signed)
Left message return call

## 2018-05-29 NOTE — Telephone Encounter (Signed)
Different rx sent in

## 2018-05-30 NOTE — Telephone Encounter (Signed)
Spoke with patient and informed her that another cough medication was called into the pharmacy. Patient verbalized understanding.

## 2018-06-20 ENCOUNTER — Other Ambulatory Visit: Payer: Self-pay | Admitting: Family Medicine

## 2018-06-24 ENCOUNTER — Other Ambulatory Visit: Payer: Self-pay | Admitting: *Deleted

## 2018-06-24 MED ORDER — LOSARTAN POTASSIUM 50 MG PO TABS: 50.0000 mg | ORAL_TABLET | Freq: Every day | ORAL | 3 refills | Status: DC

## 2018-07-08 ENCOUNTER — Other Ambulatory Visit: Payer: Self-pay | Admitting: Family Medicine

## 2018-07-09 ENCOUNTER — Telehealth: Payer: Self-pay | Admitting: *Deleted

## 2018-07-09 NOTE — Telephone Encounter (Signed)
Called pt to r/s as virtual visit. No answer left msg to call back.

## 2018-07-16 ENCOUNTER — Ambulatory Visit: Payer: 59 | Admitting: Cardiology

## 2018-07-24 ENCOUNTER — Other Ambulatory Visit: Payer: Self-pay | Admitting: Family Medicine

## 2018-07-24 MED ORDER — POTASSIUM CHLORIDE CRYS ER 20 MEQ PO TBCR: 20.0000 meq | EXTENDED_RELEASE_TABLET | Freq: Every day | ORAL | 2 refills | Status: DC

## 2018-08-19 ENCOUNTER — Ambulatory Visit: Payer: 59 | Admitting: Neurology

## 2018-09-09 ENCOUNTER — Telehealth: Payer: Self-pay

## 2018-09-09 NOTE — Telephone Encounter (Signed)
I called pt, she asked to be r/s, pt was reminded to bring her cpap to the visit on 6/22.

## 2018-09-10 ENCOUNTER — Ambulatory Visit: Payer: 59 | Admitting: Neurology

## 2018-09-14 ENCOUNTER — Encounter: Payer: Self-pay | Admitting: Neurology

## 2018-09-15 ENCOUNTER — Other Ambulatory Visit: Payer: Self-pay

## 2018-09-15 ENCOUNTER — Ambulatory Visit (INDEPENDENT_AMBULATORY_CARE_PROVIDER_SITE_OTHER): Payer: 59 | Admitting: Neurology

## 2018-09-15 ENCOUNTER — Encounter: Payer: Self-pay | Admitting: Neurology

## 2018-09-15 VITALS — BP 118/72 | HR 84 | Ht 62.0 in | Wt 247.0 lb

## 2018-09-15 DIAGNOSIS — G4733 Obstructive sleep apnea (adult) (pediatric): Secondary | ICD-10-CM | POA: Diagnosis not present

## 2018-09-15 DIAGNOSIS — Z9989 Dependence on other enabling machines and devices: Secondary | ICD-10-CM | POA: Diagnosis not present

## 2018-09-15 NOTE — Progress Notes (Signed)
Subjective:    Patient ID: Patricia [name] is a 53 y.o. female.  HPI     Interim history:   Patricia [name] is a very pleasant 53 year old right-handed woman with an underlying medical history of irritable bowel syndrome, morbid obesity, hypertension, reflux disease as well as kidney stones, who presents for follow-up consultation of her obstructive sleep apnea, well-established on treatment with CPAP. She is unaccompanied today. I last saw her on 08/15/2017, at which time she was stable on treatment.   Today, 09/15/2018: I reviewed her CPAP compliance data from 08/16/2018 through 09/14/2018, which is a total of 30 days, during which time she used her machine every night with percent used days greater than 4 hours at 93%, indicating excellent compliance with an average usage of 6 hours and 28 minutes, residual AHI at goal at 1.2/h, leak acceptable with a 95th percentile at 7.9 L/min on a pressure of 8 cm with EPR of 3.  She reports that her supplies are expensive.  She buys most of them online.  She should be eligible for new machine and I explained to her and we should try to get this through her DME company, [employer].  She would be willing to give it a try.  She reports doing well. Her weight has been stable. She does exercise on a regular basis.  She has had some flareup of her right-sided sciatica.   The patient's allergies, current medications, family history, past medical history, past social history, past surgical history and problem list were reviewed and updated as appropriate.    Previously (copied from previous notes for reference):   I saw her on 08/13/2016, at which time she was doing well with CPAP. She requested a travel CPAP machine. She had done well after her weight loss surgery in December 2017.    I reviewed her CPAP compliance data from 07/16/2017 through 08/14/2017 which is a total of 30 days, during which time she used her CPAP 29 days with percent used days greater than 4  hours at 93%, indicating excellent compliance with an average usage of 6 hours and 31 minutes, residual AHI at goal at 0.7 per hour, leak low with the 95th percentile at 1.5 L/m on a pressure of 8 cm with EPR of 3.    I saw her on 08/03/2015, at which time she was doing well, she was compliant with CPAP therapy. She reported ongoing good results with CPAP.   Of note, in the interim, she had gastric sleeve bariatric surgery on 03/12/2016.   I reviewed her CPAP compliance data from 07/14/2016 through 08/12/2016 which is a total of 30 days, during which time she used her CPAP every night with percent used days greater than 4 hours at 100%, indicating superb compliance with an average usage of 6 hours and 35 minutes, residual AHI 0.6 per hour, leak on the low side for the 95th percentile at 6.4 L/m on a pressure of 8 cm with EPR of 3.    Of note, she canceled an appointment for 01/31/2015. I saw her on 01/25/2014, at which time she reported doing well with CPAP. She was compliant with treatment. She had resolution of her morning headaches and better daytime energy. She was working on weight loss. I suggested a one-year checkup.    I reviewed her CPAP compliance data from 07/04/2015 through 08/02/2015 which is a total of 30 days during which time she used her machine every night with percent used days greater than  4 hours at 100%, indicating superb compliance with an average usage of 6 hours and 44 minutes, residual AHI low at 0.4 per hour, leak low with the 95th percentile at 7.5 L/m on a pressure of 8 cm with EPR of 3.   Of note she did not show for an appointment on 05/18/2013 as well as 09/29/2013. I saw her on 11/14/2012, at which time we discussed her baseline sleep study as well as CPAP titration study findings as well as her initial compliance data. She reported good results with CPAP and resolution of her morning headaches, but had ongoing issues with nasal congestion, rhinorrhea and postnasal drip.  I prescribed Flonase for nighttime use as well as requested that she start using nasal saline rinses during the day. I reviewed her compliance data at the time and congratulated her on her good compliance. We considered referral to an allergy specialist. I reviewed the patient's CPAP compliance data from 11/30/2012 to 12/29/2012, which is a total of 30 days, during which time the patient used CPAP every day. The average usage for all days was 5 hours and 51 minutes. The percent used days greater than 4 hours was 90 %, indicating excellent compliance. The residual AHI was 0.5 per hour, indicating a very appropriate treatment pressure of 8 cwp with EPR of 3.   I reviewed her compliance data from 12/26/2013 through 01/24/2014 which is a total of 30 days during which time she was under percent compliant, average usage of 6 hours and 40 minutes, residual AHI low at 0.5 per hour, leak low at 4.2 L/m for the 95th percentile, pressure at 8 cm with EPR of 3.   I first met her on 08/01/2012, at which time she reported nonrestorative sleep, morning headaches, daytime somnolence as well as snoring. I asked her to come back for sleep study and she had a baseline sleep test on 08/12/2012: She had an increased percentage of stage II sleep. Sleep efficiency was reduced at 76%. She had mild to moderate snoring. She had a total of 9 obstructive apneas and 49 obstructive hypopneas, resulting in an AHI of 10.4 per hour, markedly worse in REM sleep with 43.9 events per hour. Baseline oxygen saturation was 94% and her nadir was 77% in REM sleep. Based on the degree of desaturation I asked her to come back for a CPAP titration study. She had that test on 09/08/2012: Sleep efficiency was normal at 92.9%. She had a normal percentage of stage I and 2 sleep, 12.3 percent of deep sleep and an increased percentage of REM sleep at 35.2% with a reduced REM latency of 54 minutes. She had mild pruritic leg movements at 7.9 per hour but no  associated arousals from this. She was titrated on CPAP using a nasal pillows mask from 5-8 cm and had a residual AHI of 0 per hour on 8 cm of pressure. Her baseline oxygen saturation was 96%, her nadir was 88% with less than 1 minute below the saturation of 90% for the night. Based on those test results I prescribed CPAP for her. I reviewed compliance data from 10/03/2012 through 11/09/2012 (38 days), during which time she used it every day except for one day. Percent used days greater than 4 hours was 82% indicating good compliance. Her average usage was 5 hours and 3 minutes for all days and her residual AHI was 0.9 per hour indicating an adequate treatment pressure of 8 cm of water with EPR of 3.   Her  Past Medical History Is Significant For: Past Medical History:  Diagnosis Date  . Arthritis    "spine-DDD-intermittent sciatic pain"  . Chronic cervicitis    resolved  . Diabetes mellitus without complication (HCC)    diet controlled  . GERD (gastroesophageal reflux disease)   . Heart murmur    "benign"-routinely sees yearly - Dr. Deretha Emory, cardiology  . History of kidney stones    multiple kidney stones-passed.  . Hypertension   . IBS (irritable bowel syndrome)    controlled with diet  . Menorrhagia    resolved s/p Hysterectomy  . Obesity   . Sleep apnea    cpap used nightly  . Vitamin D deficiency     Her Past Surgical History Is Significant For: Past Surgical History:  Procedure Laterality Date  . BREAST LUMPECTOMY Left    benign hamartoma  . CESAREAN SECTION     x1  . COLONOSCOPY  10/19/2005  . ESOPHAGOGASTRODUODENOSCOPY  12/21/2008  . HYSTEROSCOPY W/D&C     resection of endometrial polyp  . LAPAROSCOPIC GASTRIC SLEEVE RESECTION N/A 03/12/2016   Procedure: LAPAROSCOPIC GASTRIC SLEEVE RESECTION WITH UPPER ENDO;  Surgeon: Arta Bruce Kinsinger, MD;  Location: [facility];  Service: General;  Laterality: N/A;  . LAPAROSCOPIC GASTRIC SLEEVE RESECTION  03/22/2016  .  POLYPECTOMY     uterus  . TUBAL LIGATION    . uterine ablation    . Uterine Polyps Removed    . VAGINAL HYSTERECTOMY     fibroids    Her Family History Is Significant For: Family History  Problem Relation Age of Onset  . Hypertension Father   . Hypertension Mother   . Diabetes Mother   . Coronary artery disease Mother   . Sudden death Mother   . Cancer Brother        carcinoid tumer of the appendix  . Colon cancer Neg Hx   . Esophageal cancer Neg Hx   . Stomach cancer Neg Hx   . Rectal cancer Neg Hx     Her Social History Is Significant For: Social History   Socioeconomic History  . Marital status: Married    Spouse name: Montine Circle  . Number of children: 2  . Years of education: 89  . Highest education level: Not on file  Occupational History  . Occupation: QUALITY ANALYST    Employer: [employer]  . Financial resource strain: Not on file  . Food insecurity    Worry: Not on file    Inability: Not on file  . Transportation needs    Medical: Not on file    Non-medical: Not on file  Tobacco Use  . Smoking status: Never Smoker  . Smokeless tobacco: Never Used  Substance and Sexual Activity  . Alcohol use: Yes    Alcohol/week: 0.0 standard drinks    Comment: socially  . Drug use: No  . Sexual activity: Yes    Birth control/protection: Surgical  Lifestyle  . Physical activity    Days per week: Not on file    Minutes per session: Not on file  . Stress: Not on file  Relationships  . Social Herbalist on phone: Not on file    Gets together: Not on file    Attends religious service: Not on file    Active member of club or organization: Not on file    Attends meetings of clubs or organizations: Not on file    Relationship status: Not on file  Other Topics Concern  . Not on file  Social History Narrative   Patient is right handed and consumes green tea daily    Her Allergies Are:  No Known Allergies:   Her Current Medications  Are:  Outpatient Encounter Medications as of 09/15/2018  Medication Sig  . diclofenac sodium (VOLTAREN) 1 % GEL Apply 2 g topically 4 (four) times daily.  . fluticasone (FLONASE) 50 MCG/ACT nasal spray Place 2 sprays into both nostrils daily.  Marland Kitchen losartan (COZAAR) 100 MG tablet Take 100 mg by mouth daily.  . Multiple Vitamin (MULTIVITAMIN) capsule Take 1 capsule by mouth daily.  Marland Kitchen omeprazole (PRILOSEC) 40 MG capsule TAKE 1 CAPSULE BY MOUTH EVERY DAY  . potassium chloride SA (KLOR-CON M20) 20 MEQ tablet Take 1 tablet (20 mEq total) by mouth daily.  . Probiotic Product (PROBIOTIC DAILY PO) Take 1 tablet by mouth daily.  . [DISCONTINUED] HYDROcodone-guaiFENesin 2.5-200 MG/5ML SOLN Take 10 mLs by mouth at bedtime.  . [DISCONTINUED] HYDROcodone-homatropine (HYCODAN) 5-1.5 MG/5ML syrup Take 5 mLs by mouth every 8 (eight) hours as needed for cough.  . [DISCONTINUED] losartan (COZAAR) 50 MG tablet Take 1 tablet (50 mg total) by mouth daily.  . [DISCONTINUED] losartan-hydrochlorothiazide (HYZAAR) 100-12.5 MG tablet TAKE 1 TABLET BY MOUTH EVERY DAY   Facility-Administered Encounter Medications as of 09/15/2018  Medication  . 0.9 %  sodium chloride infusion  :  Review of Systems:  Out of a complete 14 point review of systems, all are reviewed and negative with the exception of these symptoms as listed below: Review of Systems  Neurological:       Pt presents today to discuss her cpap. Pt reports that her cpap is going well.     Objective:  Neurological Exam  Physical Exam Physical Examination:   Vitals:   09/15/18 0940  BP: 118/72  Pulse: 84    General Examination: The patient is a very pleasant 53 y.o. female in no acute distress. She appears well-developed and well-nourished and well groomed.   HEENT:Normocephalic, atraumatic, pupils are equal, round and reactive to light. Extraocular tracking is good without limitation to gaze excursion or nystagmus noted. Corrective eyeglasses in  place.Normal smooth pursuit is noted. Hearing is grossly intact. Face is symmetric with normal facial animation. Speech is clear with no dysarthria noted. There is no hypophonia. There is no lip, neck/head, jaw or voice tremor.Oropharynx exam reveals: good dental hygiene and moderate airway crowding. Mallampati is class II. Tongue protrudes centrally and palate elevates symmetrically.Mild to moderate mouth dryness noted.  Chest:Clear to auscultation without wheezing, rhonchi or crackles noted.  Heart:S1+S2+0, regular and normal without murmurs, rubs or gallops noted.   Abdomen:Soft, non-tender and non-distended with normal bowel sounds appreciated on auscultation.  Extremities:There isno new abnormality, no edema.  Skin: Warm and dry without trophic changes noted.  Musculoskeletal: exam reveals no obvious joint deformities, tenderness or joint swelling or erythema.   Neurologically:  Mental status: The patient is awake, alert and oriented in all 4 spheres. Her memory, attention, language and knowledge are appropriate. There is no aphasia, agnosia, apraxia or anomia. Speech is clear with normal prosody and enunciation. Thought process is linear. Mood is congruent and affect is normal.  Cranial nerves are as described above under HEENT exam.  Motor exam: Normal bulk, strength and tone is noted. There is no tremor. Fine motor skills aregrossly intact in the UEs and LEs. Cerebellar testing shows no dysmetria or intention tremor. There is no truncal or gait ataxia.  Sensory exam is intact in the upper and lower extremities.  Gait, station and balance are unremarkable. No veering to one side is noted. No leaning to one side is noted. Posture is age-appropriate and stance is narrow based. No problems turning are noted.tandem walk good.   Assessment and Plan:   In summary, Patricia [name] is a very pleasant 53 year old female with an underlying medical history of irritable  bowel syndrome, obesity, hypertension, reflux disease,kidney stones, s/p gastric sleeve in December 2017, who presents for followup consultation of her obstructive sleep apnea, treated with CPAP treatment at a pressure of 8 cwp with ongoing good results and excellent compliance. Her physical exam is stable. Weight has been stable. She is commended for her ongoing CPAP compliance. She is likely Eligible for new CPAP machine.  She would be willing to pursue this.  I wrote for a new CPAP machine, she is advised that she will need a follow-up appointment within 3 months, she can see 1 of our nurse practitioners.  Otherwise, we will continue to see her back once a year as she has been stable and compliant.I answered all her questions today and she was in agreement. I spent 20 minutes in total face-to-face time with the patient, more than 50% of which was spent in counseling and coordination of care, reviewing test results, reviewing medication and discussing or reviewing the diagnosis of OSA, its prognosis and treatment options. Pertinent laboratory and imaging test results that were available during this visit with the patient were reviewed by me and considered in my medical decision making (see chart for details).

## 2018-09-15 NOTE — Patient Instructions (Signed)
Keep up the good work with your CPAP compliance.  I will write for a new machine, you should be eligible for 1.  You will need a 73-month follow-up after starting a new equipment.  Please schedule with the nurse practitioner. Otherwise we will see you back yearly.

## 2018-09-15 NOTE — Progress Notes (Signed)
Order for new cpap sent to [facility] via community message. Confirmation received that the order transmitted was successful.    

## 2018-10-09 ENCOUNTER — Other Ambulatory Visit: Payer: Self-pay

## 2018-10-09 ENCOUNTER — Encounter: Payer: Self-pay | Admitting: Cardiology

## 2018-10-09 ENCOUNTER — Ambulatory Visit (INDEPENDENT_AMBULATORY_CARE_PROVIDER_SITE_OTHER): Payer: 59 | Admitting: Cardiology

## 2018-10-09 VITALS — BP 128/74 | HR 78 | Temp 97.5°F | Ht 61.5 in | Wt 246.0 lb

## 2018-10-09 DIAGNOSIS — R002 Palpitations: Secondary | ICD-10-CM

## 2018-10-09 DIAGNOSIS — R0602 Shortness of breath: Secondary | ICD-10-CM

## 2018-10-09 NOTE — Patient Instructions (Signed)
Medication Instructions: Your physician recommends that you continue on your current medications as directed. Please refer to the Current Medication list given to you today.   Labwork: None  Procedures/Testing: None  Follow-Up: 1 year with Dr.Branch  Any Additional Special Instructions Will Be Listed Below (If Applicable).     If you need a refill on your cardiac medications before your next appointment, please call your pharmacy.        Thank you for choosing [facility] !

## 2018-10-09 NOTE — Progress Notes (Signed)
Clinical Summary Patricia [name] is a 53 y.o.female seen today for follow up of the following medical probems.   1. Palpitations  - previous 7 day event monitor showed no signficant arrhythmias - no recent palpitations.    2. OSA - followed by neuro Dr Rexene Alberts - compliant with cpap   3. SOB - echo 09/2015 LVEF 44-31%, normal diastolic function.  - breathing improved with weight loss after prior bariatric surgery - no recent symptoms   4. Severe obesity - s/p bariatric surgery - some recent weight gain since pandemic closed her gym. Recently started online workouts.      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 Arthritis    "spine-DDD-intermittent sciatic pain"  . Chronic cervicitis    resolved  . Diabetes mellitus without complication (HCC)    diet controlled  . GERD (gastroesophageal reflux disease)   . Heart murmur    "benign"-routinely sees yearly - Dr. Deretha Emory, cardiology  . History of kidney stones    multiple kidney stones-passed.  . Hypertension   . IBS (irritable bowel syndrome)    controlled with diet  . Menorrhagia    resolved s/p Hysterectomy  . Obesity   . Sleep apnea    cpap used nightly  . Vitamin D deficiency      No Known Allergies   Current Outpatient Medications  Medication Sig Dispense Refill  . diclofenac sodium (VOLTAREN) 1 % GEL Apply 2 g topically 4 (four) times daily. 100 g 1  . fluticasone (FLONASE) 50 MCG/ACT nasal spray Place 2 sprays into both nostrils daily. 16 g 6  . losartan (COZAAR) 100 MG tablet Take 100 mg by mouth daily.    . Multiple Vitamin (MULTIVITAMIN) capsule Take 1 capsule by mouth daily.    Marland Kitchen omeprazole (PRILOSEC) 40 MG capsule TAKE 1 CAPSULE BY MOUTH EVERY DAY 30 capsule 11  . potassium chloride SA (KLOR-CON M20) 20 MEQ tablet Take 1 tablet (20 mEq total) by mouth daily. 90  tablet 2  . Probiotic Product (PROBIOTIC DAILY PO) Take 1 tablet by mouth daily.     Current Facility-Administered Medications  Medication Dose Route Frequency Provider Last Rate Last Dose  . 0.9 %  sodium chloride infusion  500 mL Intravenous Once Gatha Mayer, MD         Past Surgical History:  Procedure Laterality Date  . BREAST LUMPECTOMY Left    benign hamartoma  . CESAREAN SECTION     x1  . COLONOSCOPY  10/19/2005  . ESOPHAGOGASTRODUODENOSCOPY  12/21/2008  . HYSTEROSCOPY W/D&C     resection of endometrial polyp  . LAPAROSCOPIC GASTRIC SLEEVE RESECTION N/A 03/12/2016   Procedure: LAPAROSCOPIC GASTRIC SLEEVE RESECTION WITH UPPER ENDO;  Surgeon: Arta Bruce Kinsinger, MD;  Location: [facility];  Service: General;  Laterality: N/A;  . LAPAROSCOPIC GASTRIC SLEEVE RESECTION  03/22/2016  . POLYPECTOMY     uterus  . TUBAL LIGATION    . uterine ablation    . Uterine Polyps Removed    . VAGINAL HYSTERECTOMY     fibroids     No Known Allergies    Family History  Problem Relation Age of Onset  . Hypertension Father   . Hypertension Mother   . Diabetes Mother   . Coronary artery disease Mother   . Sudden death Mother   . Cancer Brother  carcinoid tumer of the appendix  . Colon cancer Neg Hx   . Esophageal cancer Neg Hx   . Stomach cancer Neg Hx   . Rectal cancer Neg Hx      Social History Patricia [name] reports that she has never smoked. She has never used smokeless tobacco. Patricia [name] reports current alcohol use.   Review of Systems CONSTITUTIONAL: No weight loss, fever, chills, weakness or fatigue.  HEENT: Eyes: No visual loss, blurred vision, double vision or yellow sclerae.No hearing loss, sneezing, congestion, runny nose or sore throat.  SKIN: No rash or itching.  CARDIOVASCULAR: per hpi RESPIRATORY: No shortness of breath, cough or sputum.  GASTROINTESTINAL: No anorexia, nausea, vomiting or diarrhea. No abdominal pain or blood.  GENITOURINARY: No burning on  urination, no polyuria NEUROLOGICAL: No headache, dizziness, syncope, paralysis, ataxia, numbness or tingling in the extremities. No change in bowel or bladder control.  MUSCULOSKELETAL: No muscle, back pain, joint pain or stiffness.  LYMPHATICS: No enlarged nodes. No history of splenectomy.  PSYCHIATRIC: No history of depression or anxiety.  ENDOCRINOLOGIC: No reports of sweating, cold or heat intolerance. No polyuria or polydipsia.  Marland Kitchen   Physical Examination Today's Vitals   10/09/18 0806  BP: 128/74  Pulse: 78  Temp: (!) 97.5 F (36.4 C)  SpO2: 97%  Weight: 246 lb (111.6 kg)  Height: 5' 1.5" (1.562 m)   Body mass index is 45.73 kg/m.  Gen: resting comfortably, no acute distress HEENT: no scleral icterus, pupils equal round and reactive, no palptable cervical adenopathy,  CV: RRR, no m/r/g, no jvd Resp: Clear to auscultation bilaterally GI: abdomen is soft, non-tender, non-distended, normal bowel sounds, no hepatosplenomegaly MSK: extremities are warm, no edema.  Skin: warm, no rash Neuro:  no focal deficits Psych: appropriate affect   Diagnostic Studies  03/2013 Event Monitor 7 Days No symptoms, normal sinus rhythm  09/2015 echo Study Conclusions  - Left ventricle: The cavity size was normal. Wall thickness was normal. Systolic function was normal. The estimated ejection fraction was in the range of 60% to 65%. Wall motion was normal; there were no regional wall motion abnormalities. Left ventricular diastolic function parameters were normal for the patient&'s age. - Right atrium: Central venous pressure (est): 3 mm Hg. - Atrial septum: No defect or patent foramen ovale was identified. - Tricuspid valve: There was mild regurgitation. - Pulmonary arteries: PA peak pressure: 32 mm Hg (S). - Pericardium, extracardiac: There was no pericardial effusion.  Impressions:  - Normal LV wall thickness with LVEF 60-65%. Normal diastolic function. Mild  tricuspid regurgitation with PASP 32 mmHg.   Assessment and Plan  1. Palpitations  - benign event monitor previously - no recent symptoms, continue to monitor  2. SOB -previous benign echo. Symptoms improved with weight loss - continue to monitor  3. Severe obesity BMI 46 - working to restart working out with [employer], readjusting her diet.     F/u 1 year      Arnoldo Lenis, M.D., F.A.C.C.

## 2018-11-17 ENCOUNTER — Ambulatory Visit (INDEPENDENT_AMBULATORY_CARE_PROVIDER_SITE_OTHER): Payer: 59 | Admitting: Family Medicine

## 2018-11-17 ENCOUNTER — Other Ambulatory Visit: Payer: Self-pay

## 2018-11-17 ENCOUNTER — Encounter: Payer: Self-pay | Admitting: Family Medicine

## 2018-11-17 VITALS — BP 118/70 | HR 84 | Temp 98.6°F | Resp 16 | Ht 61.5 in | Wt 252.0 lb

## 2018-11-17 DIAGNOSIS — Z4802 Encounter for removal of sutures: Secondary | ICD-10-CM

## 2018-11-17 NOTE — Progress Notes (Signed)
Subjective:    Patient ID: Patricia [name], female    DOB: [birth year], 53 y.o.   MRN: [id number]  HPI More than a week ago, the patient suffered a small laceration to the tip of her right index finger.  [place].  She was instructed to follow-up with me a week later.  Several of the stitches have already split out.  There are still 3 small 4-0 Ethilon sutures that I removed easily without complication.  There is slight dehiscence of the wound adjacent to the fingernail.  However the remainder of the wound appears clean dry and intact with no evidence of secondary cellulitis Past Medical History:  Diagnosis Date  . Arthritis    "spine-DDD-intermittent sciatic pain"  . Chronic cervicitis    resolved  . Diabetes mellitus without complication (HCC)    diet controlled  . GERD (gastroesophageal reflux disease)   . Heart murmur    "benign"-routinely sees yearly - Dr. Deretha Emory, cardiology  . History of kidney stones    multiple kidney stones-passed.  . Hypertension   . IBS (irritable bowel syndrome)    controlled with diet  . Menorrhagia    resolved s/p Hysterectomy  . Obesity   . Sleep apnea    cpap used nightly  . Vitamin D deficiency    Past Surgical History:  Procedure Laterality Date  . BREAST LUMPECTOMY Left    benign hamartoma  . CESAREAN SECTION     x1  . COLONOSCOPY  10/19/2005  . ESOPHAGOGASTRODUODENOSCOPY  12/21/2008  . HYSTEROSCOPY W/D&C     resection of endometrial polyp  . LAPAROSCOPIC GASTRIC SLEEVE RESECTION N/A 03/12/2016   Procedure: LAPAROSCOPIC GASTRIC SLEEVE RESECTION WITH UPPER ENDO;  Surgeon: Arta Bruce Kinsinger, MD;  Location: [facility];  Service: General;  Laterality: N/A;  . LAPAROSCOPIC GASTRIC SLEEVE RESECTION  03/22/2016  . POLYPECTOMY     uterus  . TUBAL LIGATION    . uterine ablation    . Uterine Polyps Removed    . VAGINAL HYSTERECTOMY     fibroids   Current Outpatient Medications on File Prior to Visit   Medication Sig Dispense Refill  . diclofenac sodium (VOLTAREN) 1 % GEL Apply 2 g topically 4 (four) times daily. 100 g 1  . fluticasone (FLONASE) 50 MCG/ACT nasal spray Place 2 sprays into both nostrils daily. 16 g 6  . losartan (COZAAR) 100 MG tablet Take 100 mg by mouth daily.    . Multiple Vitamin (MULTIVITAMIN) capsule Take 1 capsule by mouth daily.    Marland Kitchen omeprazole (PRILOSEC) 40 MG capsule TAKE 1 CAPSULE BY MOUTH EVERY DAY 30 capsule 11  . potassium chloride SA (KLOR-CON M20) 20 MEQ tablet Take 1 tablet (20 mEq total) by mouth daily. 90 tablet 2  . Probiotic Product (PROBIOTIC DAILY PO) Take 1 tablet by mouth daily.     Current Facility-Administered Medications on File Prior to Visit  Medication Dose Route Frequency Provider Last Rate Last Dose  . 0.9 %  sodium chloride infusion  500 mL Intravenous Once Gatha Mayer, MD       No Known Allergies Social History   Socioeconomic History  . Marital status: Married    Spouse name: Montine Circle  . Number of children: 2  . Years of education: 60  . Highest education level: Not on file  Occupational History  . Occupation: QUALITY ANALYST    Employer: [employer]  . Emergency planning/management officer  strain: Not on file  . Food insecurity    Worry: Not on file    Inability: Not on file  . Transportation needs    Medical: Not on file    Non-medical: Not on file  Tobacco Use  . Smoking status: Never Smoker  . Smokeless tobacco: Never Used  Substance and Sexual Activity  . Alcohol use: Yes    Alcohol/week: 0.0 standard drinks    Comment: socially  . Drug use: No  . Sexual activity: Yes    Birth control/protection: Surgical  Lifestyle  . Physical activity    Days per week: Not on file    Minutes per session: Not on file  . Stress: Not on file  Relationships  . Social Herbalist on phone: Not on file    Gets together: Not on file    Attends religious service: Not on file    Active member of club or organization:  Not on file    Attends meetings of clubs or organizations: Not on file    Relationship status: Not on file  . Intimate partner violence    Fear of current or ex partner: Not on file    Emotionally abused: Not on file    Physically abused: Not on file    Forced sexual activity: Not on file  Other Topics Concern  . Not on file  Social History Narrative   Patient is right handed and consumes green tea daily      Review of Systems  All other systems reviewed and are negative.      Objective:   Physical Exam Vitals signs reviewed.  Constitutional:      Appearance: She is obese.  Cardiovascular:     Rate and Rhythm: Normal rate and regular rhythm.  Pulmonary:     Effort: Pulmonary effort is normal.     Breath sounds: Normal breath sounds.  Musculoskeletal:     Right hand: She exhibits laceration. She exhibits normal range of motion, no tenderness, no bony tenderness and no swelling.       Hands:  Neurological:     Mental Status: She is alert.   Laceration is 1.5 cm long.  There are 3 sutures in the laceration.  Orientation is diagrammed above        Assessment & Plan:  Sutures were removed without difficulty.  Recommended keeping Neosporin on the wound covered with a Band-Aid for the next 7 days to promote healing.

## 2018-12-09 ENCOUNTER — Other Ambulatory Visit: Payer: Self-pay

## 2018-12-09 DIAGNOSIS — Z20822 Contact with and (suspected) exposure to covid-19: Secondary | ICD-10-CM

## 2018-12-11 LAB — NOVEL CORONAVIRUS, NAA: SARS-CoV-2, NAA: NOT DETECTED

## 2018-12-12 ENCOUNTER — Encounter: Payer: Self-pay | Admitting: Internal Medicine

## 2018-12-12 ENCOUNTER — Other Ambulatory Visit: Payer: Self-pay

## 2018-12-12 ENCOUNTER — Ambulatory Visit: Payer: 59 | Admitting: Internal Medicine

## 2018-12-12 VITALS — BP 128/72 | HR 93 | Temp 97.8°F | Ht 61.5 in | Wt 255.5 lb

## 2018-12-12 DIAGNOSIS — M5431 Sciatica, right side: Secondary | ICD-10-CM | POA: Diagnosis not present

## 2018-12-12 DIAGNOSIS — R1032 Left lower quadrant pain: Secondary | ICD-10-CM | POA: Diagnosis not present

## 2018-12-12 DIAGNOSIS — R1031 Right lower quadrant pain: Secondary | ICD-10-CM | POA: Diagnosis not present

## 2018-12-12 DIAGNOSIS — G8929 Other chronic pain: Secondary | ICD-10-CM | POA: Diagnosis not present

## 2018-12-12 NOTE — Progress Notes (Signed)
Patricia [name] 53 y.o. Feb 06, 1966 [id number]  Assessment & Plan:   Encounter Diagnoses  Name Primary?  . Abdominal pain, chronic, bilateral lower quadrant Yes  . Right sided sciatica        Subjective:   Chief Complaint:  HPI The patient is here because of bilateral lower quadrant abdominal pain described as an ache.  It is been there for several weeks or more.  She does have a chronic right sciatica problem and tells me that if I do not think any GI work-up is necessary her primary care provider will send her to physical therapy.  She does have chronic back pain as well.  She saw gynecology and exam and ultrasound were negative for any ovarian problems.  The pain is there all the time it is worse with lying down and it is not affected by eating or defecation or urination.  She does have a history of IBS that seems to be under better control.  Screening colonoscopy 05/15/2017 sigmoid diverticulosis  She reports she had a gastric sleeve surgery a couple of years ago she was only able to lose 40 pounds despite "doing everything right".  She is working with a dietitian again as she gained back about 20 pounds of that 40 pound loss especially during the COVID restrictions, should she not be able to lose more weight she is considering conversion to laparoscopic Roux-en-Y gastric bypass No Known Allergies Current Meds  Medication Sig  . diclofenac sodium (VOLTAREN) 1 % GEL Apply 2 g topically 4 (four) times daily.  . fluticasone (FLONASE) 50 MCG/ACT nasal spray Place 2 sprays into both nostrils daily.  Marland Kitchen losartan (COZAAR) 100 MG tablet Take 100 mg by mouth daily.  . Multiple Vitamin (MULTIVITAMIN) capsule Take 1 capsule by mouth daily.  Marland Kitchen omeprazole (PRILOSEC) 40 MG capsule TAKE 1 CAPSULE BY MOUTH EVERY DAY  . potassium chloride SA (KLOR-CON M20) 20 MEQ tablet Take 1 tablet (20 mEq total) by mouth daily.  . Probiotic Product (PROBIOTIC DAILY PO) Take 1 tablet by mouth daily.    Current Facility-Administered Medications for the 12/12/18 encounter (Office Visit) with Gatha Mayer, MD  Medication  . 0.9 %  sodium chloride infusion   Past Medical History:  Diagnosis Date  . Arthritis    "spine-DDD-intermittent sciatic pain"  . Chronic cervicitis    resolved  . Diabetes mellitus without complication (HCC)    diet controlled  . GERD (gastroesophageal reflux disease)   . Heart murmur    "benign"-routinely sees yearly - Dr. Deretha Emory, cardiology  . History of kidney stones    multiple kidney stones-passed.  . Hypertension   . IBS (irritable bowel syndrome)    controlled with diet  . Menorrhagia    resolved s/p Hysterectomy  . Obesity   . Sleep apnea    cpap used nightly  . Vitamin D deficiency    Past Surgical History:  Procedure Laterality Date  . BREAST LUMPECTOMY Left    benign hamartoma  . CESAREAN SECTION     x1  . COLONOSCOPY  10/19/2005  . ENDOMETRIAL ABLATION    . ESOPHAGOGASTRODUODENOSCOPY  12/21/2008  . HYSTEROSCOPY W/D&C     resection of endometrial polyp  . LAPAROSCOPIC GASTRIC SLEEVE RESECTION N/A 03/12/2016   Procedure: LAPAROSCOPIC GASTRIC SLEEVE RESECTION WITH UPPER ENDO;  Surgeon: Arta Bruce Kinsinger, MD;  Location: [facility];  Service: General;  Laterality: N/A;  . TUBAL LIGATION    . Uterine Polyps Removed    .  VAGINAL HYSTERECTOMY     fibroids   Social History   Social History Narrative   Married, college education, 2 children    Teacher, early years/pre for [employer]    patient is right handed and consumes green tea daily   Never smoker no tobacco, no drug use, only rare to occasional alcohol   family history includes Cancer in her brother; Coronary artery disease in her mother; Diabetes in her mother; Hypertension in her father and mother; Sudden death in her mother.   Review of Systems As above  Objective:   Physical Exam BP 128/72 (BP Location: Left Arm, Patient Position: Sitting, Cuff Size: Normal) Comment  (BP Location): forearm  Pulse 93   Temp 97.8 F (36.6 C) (Oral)   Ht 5' 1.5" (1.562 m)   Wt 255 lb 8 oz (115.9 kg)   BMI 47.49 kg/m  Very pleasant obese black woman no acute distress The abdomen is obese soft deep palpation in both lower quadrants produces some slight tenderness.  The back is nontender.  She has 5/5 strength in both lower extremities.  She is able to stand on her toes and her heels without difficulty.  Proximal muscles are strong as well.

## 2018-12-12 NOTE — Patient Instructions (Signed)
Good luck with physical therapy.   Follow up with Dr. Carlean Purl as needed.

## 2018-12-18 ENCOUNTER — Encounter (HOSPITAL_COMMUNITY): Payer: Self-pay

## 2018-12-25 ENCOUNTER — Other Ambulatory Visit: Payer: Self-pay

## 2018-12-25 ENCOUNTER — Ambulatory Visit: Payer: 59 | Admitting: Family Medicine

## 2018-12-25 ENCOUNTER — Ambulatory Visit: Payer: 59 | Admitting: Adult Health

## 2018-12-25 ENCOUNTER — Encounter: Payer: Self-pay | Admitting: Family Medicine

## 2018-12-25 VITALS — BP 128/80 | HR 64 | Temp 97.3°F | Ht 61.5 in | Wt 253.6 lb

## 2018-12-25 DIAGNOSIS — Z9989 Dependence on other enabling machines and devices: Secondary | ICD-10-CM

## 2018-12-25 DIAGNOSIS — G4733 Obstructive sleep apnea (adult) (pediatric): Secondary | ICD-10-CM | POA: Diagnosis not present

## 2018-12-25 NOTE — Progress Notes (Addendum)
PATIENT: Patricia [name] DOB: 1966/01/10  REASON FOR VISIT: follow up HISTORY FROM: patient  Chief Complaint  Patient presents with  . Follow-up    3 mon f/u. Alone. Rm 5. No new concerns at this time.      HISTORY OF PRESENT ILLNESS: Today 12/25/18 Patricia [name] is a 53 y.o. female here today for follow up of OSA on CPAP. She is doing very well with her new CPAP machine.  Compliance report dated 11/24/2018 through 12/23/2018 reveals that she is using CPAP 27 out of the last 30 days for compliance of 90%.  27 days she used CPAP greater than 4 hours for compliance 90%.  Average usage was 7 hours and 15 minutes.  AHI was 0.6 on 8 cm of water and an EPR of 3.  There was no significant leak noted.  She does report going out of town for a week and that contributes to days lost.  HISTORY: (copied from Dr Guadelupe Sabin note on 09/15/2018)  Ms. Hesch is a very pleasant 54 year old right-handed woman with an underlying medical history of irritable bowel syndrome, morbid obesity, hypertension, reflux disease as well as kidney stones, who presents for follow-up consultation of her obstructive sleep apnea, well-established on treatment with CPAP. She is unaccompanied today. I last saw her on 08/15/2017, at which time she was stable on treatment.   Today, 09/15/2018: I reviewed her CPAP compliance data from 08/16/2018 through 09/14/2018, which is a total of 30 days, during which time she used her machine every night with percent used days greater than 4 hours at 93%, indicating excellent compliance with an average usage of 6 hours and 28 minutes, residual AHI at goal at 1.2/h, leak acceptable with a 95th percentile at 7.9 L/min on a pressure of 8 cm with EPR of 3.  She reports that her supplies are expensive.  She buys most of them online.  She should be eligible for new machine and I explained to her and we should try to get this through her DME company, [employer].  She would be willing to give it a try.   She reports doing well. Her weight has been stable. She does exercise on a regular basis.  She has had some flareup of her right-sided sciatica.  The patient's allergies, current medications, family history, past medical history, past social history, past surgical history and problem list were reviewed and updated as appropriate.  Previously (copied from previous notes for reference):  I saw her on 08/13/2016, at which time she was doing well with CPAP. She requested a travel CPAP machine. She had done well after her weight loss surgery in December 2017.   I reviewed her CPAP compliance data from 07/16/2017 through 08/14/2017 which is a total of 30 days, during which time she used her CPAP 29 days with percent used days greater than 4 hours at 93%, indicating excellent compliance with an average usage of 6 hours and 31 minutes, residual AHI at goal at 0.7 per hour, leak low with the 95thpercentile at 1.5 L/m on a pressure of 8 cm with EPR of 3.   I saw her on 08/03/2015, at which time she was doing well, she was compliant with CPAP therapy. She reported ongoing good results with CPAP.  Of note, in the interim, she had gastric sleeve bariatric surgery on 03/12/2016.  I reviewed her CPAP compliance data from 07/14/2016 through 08/12/2016 which is a total of 30 days, during which time she used  her CPAP every night with percent used days greater than 4 hours at 100%, indicating superb compliance with an average usage of 6 hours and 35 minutes, residual AHI 0.6 per hour, leak on the low side for the 95th percentile at 6.4 L/m on a pressure of 8 cm with EPR of 3.   Of note, she canceled an appointment for 01/31/2015. I saw her on 01/25/2014, at which time she reported doing well with CPAP. She was compliant with treatment. She had resolution of her morning headaches and better daytime energy. She was working on weight loss. I suggested a one-year checkup.   I reviewed her CPAP compliance  data from 07/04/2015 through 08/02/2015 which is a total of 30 days during which time she used her machine every night with percent used days greater than 4 hours at 100%, indicating superb compliance with an average usage of 6 hours and 44 minutes, residual AHI low at 0.4 per hour, leak low with the 95th percentile at 7.5 L/m on a pressure of 8 cm with EPR of 3.  Of note she did not show for an appointment on 05/18/2013 as well as 09/29/2013. I saw her on 11/14/2012, at which time we discussed her baseline sleep study as well as CPAP titration study findings as well as her initial compliance data. She reported good results with CPAP and resolution of her morning headaches, but had ongoing issues with nasal congestion, rhinorrhea and postnasal drip. I prescribed Flonase for nighttime use as well as requested that she start using nasal saline rinses during the day. I reviewed her compliance data at the time and congratulated her on her good compliance. We considered referral to an allergy specialist. I reviewed the patient's CPAP compliance data from 11/30/2012 to 12/29/2012, which is a total of 30 days, during which time the patient used CPAP every day. The average usage for all days was 5 hours and 51 minutes. The percent used days greater than 4 hours was 90 %, indicating excellent compliance. The residual AHI was 0.5 per hour, indicating a very appropriate treatment pressure of 8 cwp with EPR of 3.  I reviewed her compliance data from 12/26/2013 through 01/24/2014 which is a total of 30 days during which time she was under percent compliant, average usage of 6 hours and 40 minutes, residual AHI low at 0.5 per hour, leak low at 4.2 L/m for the 95th percentile, pressure at 8 cm with EPR of 3.  I first met her on 08/01/2012, at which time she reported nonrestorative sleep, morning headaches, daytime somnolence as well as snoring. I asked her to come back for sleep study and she had a baseline sleep test on  08/12/2012: She had an increased percentage of stage II sleep. Sleep efficiency was reduced at 76%. She had mild to moderate snoring. She had a total of 9 obstructive apneas and 49 obstructive hypopneas, resulting in an AHI of 10.4 per hour, markedly worse in REM sleep with 43.9 events per hour. Baseline oxygen saturation was 94% and her nadir was 77% in REM sleep. Based on the degree of desaturation I asked her to come back for a CPAP titration study. She had that test on 09/08/2012: Sleep efficiency was normal at 92.9%. She had a normal percentage of stage I and 2 sleep, 12.3 percent of deep sleep and an increased percentage of REM sleep at 35.2% with a reduced REM latency of 54 minutes. She had mild pruritic leg movements at 7.9 per hour but  no associated arousals from this. She was titrated on CPAP using a nasal pillows mask from 5-8 cm and had a residual AHI of 0 per hour on 8 cm of pressure. Her baseline oxygen saturation was 96%, her nadir was 88% with less than 1 minute below the saturation of 90% for the night. Based on those test results I prescribed CPAP for her. I reviewed compliance data from 10/03/2012 through 11/09/2012 (38 days), during which time she used it every day except for one day. Percent used days greater than 4 hours was 82% indicating good compliance. Her average usage was 5 hours and 3 minutes for all days and her residual AHI was 0.9 per hour indicating an adequate treatment pressure of 8 cm of water with EPR of 3.   REVIEW OF SYSTEMS: Out of a complete 14 system review of symptoms, the patient complains only of the following symptoms, hearing loss, murmur, apnea and all other reviewed systems are negative.  Epworth sleepiness scale: 12 Fatigue severity scale: 18  ALLERGIES: No Known Allergies  HOME MEDICATIONS: Outpatient Medications Prior to Visit  Medication Sig Dispense Refill  . diclofenac sodium (VOLTAREN) 1 % GEL Apply 2 g topically 4 (four) times daily. 100 g 1  .  fluticasone (FLONASE) 50 MCG/ACT nasal spray Place 2 sprays into both nostrils daily. 16 g 6  . losartan (COZAAR) 100 MG tablet Take 100 mg by mouth daily.    . Multiple Vitamin (MULTIVITAMIN) capsule Take 1 capsule by mouth daily.    Marland Kitchen omeprazole (PRILOSEC) 40 MG capsule TAKE 1 CAPSULE BY MOUTH EVERY DAY 30 capsule 11  . potassium chloride SA (KLOR-CON M20) 20 MEQ tablet Take 1 tablet (20 mEq total) by mouth daily. 90 tablet 2  . Probiotic Product (PROBIOTIC DAILY PO) Take 1 tablet by mouth daily.     Facility-Administered Medications Prior to Visit  Medication Dose Route Frequency Provider Last Rate Last Dose  . 0.9 %  sodium chloride infusion  500 mL Intravenous Once Gatha Mayer, MD        PAST MEDICAL HISTORY: Past Medical History:  Diagnosis Date  . Arthritis    "spine-DDD-intermittent sciatic pain"  . Chronic cervicitis    resolved  . Diabetes mellitus without complication (HCC)    diet controlled  . GERD (gastroesophageal reflux disease)   . Heart murmur    "benign"-routinely sees yearly - Dr. Deretha Emory, cardiology  . History of kidney stones    multiple kidney stones-passed.  . Hypertension   . IBS (irritable bowel syndrome)    controlled with diet  . Menorrhagia    resolved s/p Hysterectomy  . Obesity   . Sleep apnea    cpap used nightly  . Vitamin D deficiency     PAST SURGICAL HISTORY: Past Surgical History:  Procedure Laterality Date  . BREAST LUMPECTOMY Left    benign hamartoma  . CESAREAN SECTION     x1  . COLONOSCOPY  10/19/2005  . ENDOMETRIAL ABLATION    . ESOPHAGOGASTRODUODENOSCOPY  12/21/2008  . HYSTEROSCOPY W/D&C     resection of endometrial polyp  . LAPAROSCOPIC GASTRIC SLEEVE RESECTION N/A 03/12/2016   Procedure: LAPAROSCOPIC GASTRIC SLEEVE RESECTION WITH UPPER ENDO;  Surgeon: Arta Bruce Kinsinger, MD;  Location: [facility];  Service: General;  Laterality: N/A;  . TUBAL LIGATION    . Uterine Polyps Removed    . VAGINAL HYSTERECTOMY      fibroids    FAMILY HISTORY: Family History  Problem Relation Age  of Onset  . Hypertension Father   . Hypertension Mother   . Diabetes Mother   . Coronary artery disease Mother   . Sudden death Mother   . Cancer Brother        carcinoid tumer of the appendix  . Colon cancer Neg Hx   . Esophageal cancer Neg Hx   . Stomach cancer Neg Hx   . Rectal cancer Neg Hx     SOCIAL HISTORY: Social History   Socioeconomic History  . Marital status: Married    Spouse name: Montine Circle  . Number of children: 2  . Years of education: 57  . Highest education level: Not on file  Occupational History  . Occupation: QUALITY ANALYST    Employer: [employer]  . Financial resource strain: Not on file  . Food insecurity    Worry: Not on file    Inability: Not on file  . Transportation needs    Medical: Not on file    Non-medical: Not on file  Tobacco Use  . Smoking status: Never Smoker  . Smokeless tobacco: Never Used  Substance and Sexual Activity  . Alcohol use: Yes    Alcohol/week: 0.0 standard drinks    Comment: socially  . Drug use: No  . Sexual activity: Yes    Birth control/protection: Surgical  Lifestyle  . Physical activity    Days per week: Not on file    Minutes per session: Not on file  . Stress: Not on file  Relationships  . Social Herbalist on phone: Not on file    Gets together: Not on file    Attends religious service: Not on file    Active member of club or organization: Not on file    Attends meetings of clubs or organizations: Not on file    Relationship status: Not on file  . Intimate partner violence    Fear of current or ex partner: Not on file    Emotionally abused: Not on file    Physically abused: Not on file    Forced sexual activity: Not on file  Other Topics Concern  . Not on file  Social History Narrative   Married, college education, 2 children    Teacher, early years/pre for [employer]    patient is right handed and  consumes green tea daily   Never smoker no tobacco, no drug use, only rare to occasional alcohol      PHYSICAL EXAM  Vitals:   12/25/18 0839  BP: 128/80  Pulse: 64  Temp: (!) 97.3 F (36.3 C)  TempSrc: Oral  Weight: 253 lb 9.6 oz (115 kg)  Height: 5' 1.5" (1.562 m)   Body mass index is 47.14 kg/m.  Generalized: Well developed, in no acute distress  Cardiology: normal rate and rhythm, no murmur noted Respiratory: Clear to auscultation bilaterally Mallampati 3+, neck circ 16.5" Neurological examination  Mentation: Alert oriented to time, place, history taking. Follows all commands speech and language fluent Cranial nerve II-XII: Pupils were equal round reactive to light. Extraocular movements were full, visual field were full on confrontational test. Facial sensation and strength were normal. Uvula tongue midline. Head turning and shoulder shrug  were normal and symmetric. Motor: The motor testing reveals 5 over 5 strength of all 4 extremities. Good symmetric motor tone is noted throughout.  Sensory: Sensory testing is intact to soft touch on all 4 extremities. No evidence of extinction is noted.  Coordination: Cerebellar testing  reveals good finger-nose-finger and heel-to-shin bilaterally.  Gait and station: Gait is normal.   DIAGNOSTIC DATA (LABS, IMAGING, TESTING) - I reviewed patient records, labs, notes, testing and imaging myself where available.  No flowsheet data found.   Lab Results  Component Value Date   WBC 4.3 08/01/2017   HGB 13.6 08/01/2017   HCT 40.2 08/01/2017   MCV 85.7 08/01/2017   PLT 254 08/01/2017      Component Value Date/Time   NA 138 11/11/2017 1137   K 4.3 11/11/2017 1137   CL 105 11/11/2017 1137   CO2 26 11/11/2017 1137   GLUCOSE 84 11/11/2017 1137   GLUCOSE 88 08/01/2017 0955   BUN 11 11/11/2017 1137   CREATININE 0.60 11/11/2017 1137   CREATININE 0.62 08/01/2017 0955   CALCIUM 9.6 11/11/2017 1137   PROT 6.6 11/11/2017 1137   ALBUMIN  4.1 11/11/2017 1137   AST 14 11/11/2017 1137   ALT 12 11/11/2017 1137   ALKPHOS 96 11/11/2017 1137   BILITOT 0.3 11/11/2017 1137   GFRNONAA 107 11/11/2017 1137   GFRNONAA 105 08/01/2017 0955   GFRAA 123 11/11/2017 1137   GFRAA 122 08/01/2017 0955   Lab Results  Component Value Date   CHOL 204 (H) 11/11/2017   HDL 74 11/11/2017   LDLCALC 107 (H) 11/11/2017   TRIG 113 11/11/2017   CHOLHDL 3.0 08/01/2017   Lab Results  Component Value Date   HGBA1C 5.8 (H) 11/11/2017   Lab Results  Component Value Date   VITAMINB12 >2000 (H) 11/11/2017   Lab Results  Component Value Date   TSH 1.570 11/11/2017     ASSESSMENT AND PLAN 53 y.o. year old female  has a past medical history of Arthritis, Chronic cervicitis, Diabetes mellitus without complication ([place]), GERD (gastroesophageal reflux disease), Heart murmur, History of kidney stones, Hypertension, IBS (irritable bowel syndrome), Menorrhagia, Obesity, Sleep apnea, and Vitamin D deficiency. here with     ICD-10-CM   1. OSA on CPAP  G47.33    Z99.89     Ms. Bumgardner is doing very well on CPAP therapy.  She is enjoying her new CPAP machine.  Compliance report reveals excellent compliance.  She was encouraged to continue using CPAP nightly and for greater than 4 hours each night.  We will follow-up in 1 year, sooner if needed.  She verbalizes understanding and agreement with this plan.   No orders of the defined types were placed in this encounter.    No orders of the defined types were placed in this encounter.     I spent 15 minutes with the patient. 50% of this time was spent counseling and educating patient on plan of care and medications.    Debbora Presto, FNP-C 12/25/2018, 9:05 AM [facility] [address], [place], [place] [address] [phone]  I reviewed the above note and documentation by the Nurse Practitioner and agree with the history, exam, assessment and plan as outlined above. I was available  for consultation. Star Age, MD, PhD [facility])

## 2018-12-25 NOTE — Patient Instructions (Signed)
Continue CPAP nightly and for greater than 4 hours each night.   Follow up in 1 year, sooner if needed  Sleep Apnea Sleep apnea affects breathing during sleep. It causes breathing to stop for a short time or to become shallow. It can also increase the risk of:  Heart attack.  Stroke.  Being very overweight (obese).  Diabetes.  Heart failure.  Irregular heartbeat. The goal of treatment is to help you breathe normally again. What are the causes? There are three kinds of sleep apnea:  Obstructive sleep apnea. This is caused by a blocked or collapsed airway.  Central sleep apnea. This happens when the brain does not send the right signals to the muscles that control breathing.  Mixed sleep apnea. This is a combination of obstructive and central sleep apnea. The most common cause of this condition is a collapsed or blocked airway. This can happen if:  Your throat muscles are too relaxed.  Your tongue and tonsils are too large.  You are overweight.  Your airway is too small. What increases the risk?  Being overweight.  Smoking.  Having a small airway.  Being older.  Being female.  Drinking alcohol.  Taking medicines to calm yourself (sedatives or tranquilizers).  Having family members with the condition. What are the signs or symptoms?  Trouble staying asleep.  Being sleepy or tired during the day.  Getting angry a lot.  Loud snoring.  Headaches in the morning.  Not being able to focus your mind (concentrate).  Forgetting things.  Less interest in sex.  Mood swings.  Personality changes.  Feelings of sadness (depression).  Waking up a lot during the night to pee (urinate).  Dry mouth.  Sore throat. How is this diagnosed?  Your medical history.  A physical exam.  A test that is done when you are sleeping (sleep study). The test is most often done in a sleep lab but may also be done at home. How is this treated?   Sleeping on your  side.  Using a medicine to get rid of mucus in your nose (decongestant).  Avoiding the use of alcohol, medicines to help you relax, or certain pain medicines (narcotics).  Losing weight, if needed.  Changing your diet.  Not smoking.  Using a machine to open your airway while you sleep, such as: ? An oral appliance. This is a mouthpiece that shifts your lower jaw forward. ? A CPAP device. This device blows air through a mask when you breathe out (exhale). ? An EPAP device. This has valves that you put in each nostril. ? A BPAP device. This device blows air through a mask when you breathe in (inhale) and breathe out.  Having surgery if other treatments do not work. It is important to get treatment for sleep apnea. Without treatment, it can lead to:  High blood pressure.  Coronary artery disease.  In men, not being able to have an erection (impotence).  Reduced thinking ability. Follow these instructions at home: Lifestyle  Make changes that your doctor recommends.  Eat a healthy diet.  Lose weight if needed.  Avoid alcohol, medicines to help you relax, and some pain medicines.  Do not use any products that contain nicotine or tobacco, such as cigarettes, e-cigarettes, and chewing tobacco. If you need help quitting, ask your doctor. General instructions  Take over-the-counter and prescription medicines only as told by your doctor.  If you were given a machine to use while you sleep, use it only  as told by your doctor.  If you are having surgery, make sure to tell your doctor you have sleep apnea. You may need to bring your device with you.  Keep all follow-up visits as told by your doctor. This is important. Contact a doctor if:  The machine that you were given to use during sleep bothers you or does not seem to be working.  You do not get better.  You get worse. Get help right away if:  Your chest hurts.  You have trouble breathing in enough air.  You have  an uncomfortable feeling in your back, arms, or stomach.  You have trouble talking.  One side of your body feels weak.  A part of your face is hanging down. These symptoms may be an emergency. Do not wait to see if the symptoms will go away. Get medical help right away. Call your local emergency services (911 in the [place] Do not drive yourself to the hospital. Summary  This condition affects breathing during sleep.  The most common cause is a collapsed or blocked airway.  The goal of treatment is to help you breathe normally while you sleep. This information is not intended to replace advice given to you by your health care provider. Make sure you discuss any questions you have with your health care provider. Document Released: 12/20/2007 Document Revised: 12/27/2017 Document Reviewed: 11/05/2017 Elsevier Patient Education  2020 Elsevier Inc.  

## 2019-01-28 ENCOUNTER — Other Ambulatory Visit: Payer: Self-pay

## 2019-01-28 ENCOUNTER — Ambulatory Visit (INDEPENDENT_AMBULATORY_CARE_PROVIDER_SITE_OTHER): Payer: 59 | Admitting: Family Medicine

## 2019-01-28 ENCOUNTER — Encounter: Payer: Self-pay | Admitting: Family Medicine

## 2019-01-28 DIAGNOSIS — J0191 Acute recurrent sinusitis, unspecified: Secondary | ICD-10-CM | POA: Diagnosis not present

## 2019-01-28 MED ORDER — FLUCONAZOLE 150 MG PO TABS: ORAL_TABLET | ORAL | 0 refills | Status: DC

## 2019-01-28 MED ORDER — AMOXICILLIN-POT CLAVULANATE 875-125 MG PO TABS: 1.0000 | ORAL_TABLET | Freq: Two times a day (BID) | ORAL | 0 refills | Status: DC

## 2019-01-28 NOTE — Progress Notes (Signed)
Virtual Visit via Telephone Note  I connected with Patricia [name] on 01/28/19 at 12:03pm by telephone and verified that I am speaking with the correct person using two identifiers.     Pt location: at home   Physician location:  In office, [facility], Vic Blackbird MD     On call: patient and physician   I discussed the limitations, risks, security and privacy concerns of performing an evaluation and management service by telephone and the availability of in person appointments. I also discussed with the patient that there may be a patient responsible charge related to this service. The patient expressed understanding and agreed to proceed.   History of Present Illness:  Concerned for sinus infection. She was last treated in March. She does have history of sinus infections, uses CPAP She has facial pain, watering eyes, drainage from sinuses and post nasal drip for past few days. No fever or sore throat, she gets occ from the post nasal drip. She has used sudafed and flonase.  She got a new CPAP machine  a few months ago     Observations/Objective: NAD noted over the phone speaking in full sentences.  Assessment and Plan: Acute sinusitis we will treat as she has had recurrent sinus infections in the past.  She is now on a new CPAP machine states that her tubing and machine are clean.  We will have her continue Flonase she can also alternate with nasal saline..  Discontinue the Sudafed in setting of her hypertension.  She can use regular any oral antihistamine such as loratadine or certirizne  etc.  We will also add Augmentin twice a day to her regimen.  Follow Up Instructions:    I discussed the assessment and treatment plan with the patient. The patient was provided an opportunity to ask questions and all were answered. The patient agreed with the plan and demonstrated an understanding of the instructions.   The patient was advised to call back or seek an in-person  evaluation if the symptoms worsen or if the condition fails to improve as anticipated.  I provided 5 minutes of non-face-to-face time during this encounter. End Time: 12:08pm  Vic Blackbird, MD

## 2019-02-02 ENCOUNTER — Other Ambulatory Visit: Payer: Self-pay

## 2019-02-02 DIAGNOSIS — Z20822 Contact with and (suspected) exposure to covid-19: Secondary | ICD-10-CM

## 2019-02-03 LAB — NOVEL CORONAVIRUS, NAA: SARS-CoV-2, NAA: NOT DETECTED

## 2019-02-14 LAB — HM MAMMOGRAPHY

## 2019-02-26 ENCOUNTER — Encounter: Payer: Self-pay | Admitting: *Deleted

## 2019-04-09 ENCOUNTER — Ambulatory Visit: Payer: 59 | Attending: Internal Medicine

## 2019-04-09 DIAGNOSIS — Z20822 Contact with and (suspected) exposure to covid-19: Secondary | ICD-10-CM

## 2019-04-10 LAB — NOVEL CORONAVIRUS, NAA: SARS-CoV-2, NAA: NOT DETECTED

## 2019-05-05 ENCOUNTER — Ambulatory Visit: Payer: 59 | Attending: Internal Medicine

## 2019-05-05 DIAGNOSIS — Z20822 Contact with and (suspected) exposure to covid-19: Secondary | ICD-10-CM

## 2019-05-06 LAB — NOVEL CORONAVIRUS, NAA: SARS-CoV-2, NAA: NOT DETECTED

## 2019-05-13 ENCOUNTER — Other Ambulatory Visit: Payer: Self-pay | Admitting: Family Medicine

## 2019-05-19 ENCOUNTER — Ambulatory Visit: Payer: 59 | Attending: Internal Medicine

## 2019-05-19 DIAGNOSIS — Z20822 Contact with and (suspected) exposure to covid-19: Secondary | ICD-10-CM

## 2019-05-20 LAB — NOVEL CORONAVIRUS, NAA: SARS-CoV-2, NAA: NOT DETECTED

## 2019-06-16 ENCOUNTER — Other Ambulatory Visit: Payer: Self-pay | Admitting: Family Medicine

## 2019-06-30 ENCOUNTER — Ambulatory Visit
Admission: RE | Admit: 2019-06-30 | Discharge: 2019-06-30 | Disposition: A | Discharge status: Home / Self Care | Payer: 59 | Source: Ambulatory Visit | Attending: Family Medicine | Admitting: Family Medicine

## 2019-06-30 ENCOUNTER — Ambulatory Visit (INDEPENDENT_AMBULATORY_CARE_PROVIDER_SITE_OTHER): Payer: 59 | Admitting: Family Medicine

## 2019-06-30 ENCOUNTER — Other Ambulatory Visit: Payer: Self-pay

## 2019-06-30 VITALS — BP 136/78 | HR 60 | Temp 96.9°F | Resp 14 | Ht 61.5 in | Wt 258.0 lb

## 2019-06-30 DIAGNOSIS — Z0001 Encounter for general adult medical examination with abnormal findings: Secondary | ICD-10-CM

## 2019-06-30 DIAGNOSIS — R1032 Left lower quadrant pain: Secondary | ICD-10-CM

## 2019-06-30 DIAGNOSIS — Z0000 Encounter for general adult medical examination without abnormal findings: Secondary | ICD-10-CM

## 2019-06-30 DIAGNOSIS — I10 Essential (primary) hypertension: Secondary | ICD-10-CM

## 2019-06-30 DIAGNOSIS — R7303 Prediabetes: Secondary | ICD-10-CM | POA: Diagnosis not present

## 2019-06-30 DIAGNOSIS — H9190 Unspecified hearing loss, unspecified ear: Secondary | ICD-10-CM | POA: Diagnosis not present

## 2019-06-30 DIAGNOSIS — H9113 Presbycusis, bilateral: Secondary | ICD-10-CM

## 2019-06-30 NOTE — Progress Notes (Signed)
Subjective:    Patient ID: Patricia [name], female    DOB: 02/21/1966, 54 y.o.   MRN: [id number]  HPI  Patient is a very pleasant 54 year old African-American female who presents today for complete physical exam.  However she has 2 major concerns.  First she reports a 4-week history of left lower quadrant abdominal pain.  It aches and throbs in her left lower quadrant on a daily basis.  There are no exacerbating or alleviating factors.  She denies any melena or hematochezia although her stool has been darker.  She denies any nausea or vomiting.  She denies any vaginal bleeding or dysuria or hematuria.  She saw her gynecologist who performed a pelvic ultrasound that was "normal".  She also contacted her gastroenterologist who suspects this may be due to low back pain.  The patient states that she is only having "pebbles" whenever she has a bowel movement.  She also reports increasing gas and bloating.  Second concern is decreased hearing.  She states that all of her friends are stating that she needs to "have her hearing checked". Past Medical History:  Diagnosis Date  . Arthritis    "spine-DDD-intermittent sciatic pain"  . Chronic cervicitis    resolved  . Diabetes mellitus without complication (HCC)    diet controlled  . GERD (gastroesophageal reflux disease)   . Heart murmur    "benign"-routinely sees yearly - Dr. Deretha Emory, cardiology  . History of kidney stones    multiple kidney stones-passed.  . Hypertension   . IBS (irritable bowel syndrome)    controlled with diet  . Menorrhagia    resolved s/p Hysterectomy  . Obesity   . Sleep apnea    cpap used nightly  . Vitamin D deficiency    Past Surgical History:  Procedure Laterality Date  . BREAST LUMPECTOMY Left    benign hamartoma  . CESAREAN SECTION     x1  . COLONOSCOPY  10/19/2005  . ENDOMETRIAL ABLATION    . ESOPHAGOGASTRODUODENOSCOPY  12/21/2008  . HYSTEROSCOPY WITH D & C     resection of endometrial polyp  .  LAPAROSCOPIC GASTRIC SLEEVE RESECTION N/A 03/12/2016   Procedure: LAPAROSCOPIC GASTRIC SLEEVE RESECTION WITH UPPER ENDO;  Surgeon: Arta Bruce Kinsinger, MD;  Location: [facility];  Service: General;  Laterality: N/A;  . TUBAL LIGATION    . Uterine Polyps Removed    . VAGINAL HYSTERECTOMY     fibroids   Current Outpatient Medications on File Prior to Visit  Medication Sig Dispense Refill  . diclofenac sodium (VOLTAREN) 1 % GEL Apply 2 g topically 4 (four) times daily. 100 g 1  . fluticasone (FLONASE) 50 MCG/ACT nasal spray Place 2 sprays into both nostrils daily. 16 g 6  . KLOR-CON M20 20 MEQ tablet TAKE 1 TABLET BY MOUTH EVERY DAY 30 tablet 8  . losartan-hydrochlorothiazide (HYZAAR) 100-12.5 MG tablet Take 1 tablet by mouth daily.    . Multiple Vitamin (MULTIVITAMIN) capsule Take 1 capsule by mouth daily.    Marland Kitchen omeprazole (PRILOSEC) 40 MG capsule TAKE 1 CAPSULE BY MOUTH EVERY DAY 30 capsule 11  . Probiotic Product (PROBIOTIC DAILY PO) Take 1 tablet by mouth daily.     Current Facility-Administered Medications on File Prior to Visit  Medication Dose Route Frequency Provider Last Rate Last Admin  . 0.9 %  sodium chloride infusion  500 mL Intravenous Once Gatha Mayer, MD       No Known Allergies Social History   Socioeconomic  History  . Marital status: Married    Spouse name: Montine Circle  . Number of children: 2  . Years of education: 15  . Highest education level: Not on file  Occupational History  . Occupation: QUALITY ANALYST    Employer: [employer]  Tobacco Use  . Smoking status: Never Smoker  . Smokeless tobacco: Never Used  Substance and Sexual Activity  . Alcohol use: Yes    Alcohol/week: 0.0 standard drinks    Comment: socially  . Drug use: No  . Sexual activity: Yes    Birth control/protection: Surgical  Other Topics Concern  . Not on file  Social History Narrative   Married, college education, 2 children    Teacher, early years/pre for [employer]    patient is  right handed and consumes green tea daily   Never smoker no tobacco, no drug use, only rare to occasional alcohol   Social Determinants of Radio broadcast assistant Strain:   . Difficulty of Paying Living Expenses:   Food Insecurity:   . Worried About Charity fundraiser in the Last Year:   . Arboriculturist in the Last Year:   Transportation Needs:   . Film/video editor (Medical):   Marland Kitchen Lack of Transportation (Non-Medical):   Physical Activity:   . Days of Exercise per Week:   . Minutes of Exercise per Session:   Stress:   . Feeling of Stress :   Social Connections:   . Frequency of Communication with Friends and Family:   . Frequency of Social Gatherings with Friends and Family:   . Attends Religious Services:   . Active Member of Clubs or Organizations:   . Attends Archivist Meetings:   Marland Kitchen Marital Status:   Intimate Partner Violence:   . Fear of Current or Ex-Partner:   . Emotionally Abused:   Marland Kitchen Physically Abused:   . Sexually Abused:    Family History  Problem Relation Age of Onset  . Hypertension Father   . Hypertension Mother   . Diabetes Mother   . Coronary artery disease Mother   . Sudden death Mother   . Cancer Brother        carcinoid tumer of the appendix  . Colon cancer Neg Hx   . Esophageal cancer Neg Hx   . Stomach cancer Neg Hx   . Rectal cancer Neg Hx       Review of Systems  All other systems reviewed and are negative.      Objective:   Physical Exam Vitals reviewed.  Constitutional:      General: She is not in acute distress.    Appearance: She is well-developed. She is not diaphoretic.  HENT:     Head: Normocephalic and atraumatic.     Right Ear: External ear normal.     Left Ear: External ear normal.     Nose: Nose normal.     Mouth/Throat:     Pharynx: No oropharyngeal exudate.  Eyes:     General: No scleral icterus.       Right eye: No discharge.        Left eye: No discharge.     Conjunctiva/sclera:  Conjunctivae normal.     Pupils: Pupils are equal, round, and reactive to light.  Neck:     Thyroid: No thyromegaly.     Vascular: No JVD.     Trachea: No tracheal deviation.  Cardiovascular:     Rate and Rhythm: Normal  rate and regular rhythm.     Heart sounds: Normal heart sounds. No murmur. No friction rub. No gallop.   Pulmonary:     Effort: Pulmonary effort is normal. No respiratory distress.     Breath sounds: Normal breath sounds. No stridor. No wheezing or rales.  Chest:     Chest wall: No tenderness.  Abdominal:     General: Bowel sounds are normal. There is no distension.     Palpations: Abdomen is soft. There is no mass.     Tenderness: There is no abdominal tenderness. There is no guarding or rebound.     Hernia: No hernia is present.  Musculoskeletal:     Cervical back: Normal range of motion and neck supple.  Lymphadenopathy:     Cervical: No cervical adenopathy.  Skin:    General: Skin is warm.     Coloration: Skin is not pale.     Findings: No erythema or rash.  Neurological:     Mental Status: She is alert and oriented to person, place, and time.     Cranial Nerves: No cranial nerve deficit.     Sensory: No sensory deficit.     Motor: No abnormal muscle tone.     Coordination: Coordination normal.     Deep Tendon Reflexes: Reflexes normal.  Psychiatric:        Behavior: Behavior normal.        Thought Content: Thought content normal.           Assessment & Plan:  Prediabetes - Plan: CBC with Differential/Platelet, COMPLETE METABOLIC PANEL WITH GFR, Lipid panel, Microalbumin, urine, Hemoglobin A1c  Left lower quadrant abdominal pain - Plan: DG Abd Acute W/Chest  Essential hypertension  General medical exam  Presbycusis of both ears - Plan: Ambulatory referral to Audiology  Patient did poorly on her hearing screen bilaterally.  I will consult audiology as the patient is considering possibly using hearing aid.  Her abdominal pain is uncertain.  I  believe this is most likely due to IBS or constipation.  I have recommended an abdominal x-ray.  If x-ray confirms constipation, I will treat the patient with Linzess to see if symptoms improve.  If x-ray is clear worsen if symptoms do not improve, I would proceed with a CT scan abdomen pelvis given severity of the pain.  Blood pressure today is well controlled.  Given her history of prediabetes, I will check a CBC, CMP, fasting lipid panel, urine microalbumin, hemoglobin A1c.  Her immunizations are up-to-date.  Colonoscopy was performed 2 years ago was normal and therefore is up-to-date.  Mammogram is up-to-date.  Pap smear is performed by gynecology.

## 2019-07-01 LAB — CBC WITH DIFFERENTIAL/PLATELET
Absolute Monocytes: 334 cells/uL (ref 200–950)
Basophils Absolute: 22 cells/uL (ref 0–200)
Basophils Relative: 0.5 %
Eosinophils Absolute: 123 cells/uL (ref 15–500)
Eosinophils Relative: 2.8 %
HCT: 41.2 % (ref 35.0–45.0)
Hemoglobin: 13.6 g/dL (ref 11.7–15.5)
Lymphs Abs: 1681 cells/uL (ref 850–3900)
MCH: 29.1 pg (ref 27.0–33.0)
MCHC: 33 g/dL (ref 32.0–36.0)
MCV: 88 fL (ref 80.0–100.0)
MPV: 10.4 fL (ref 7.5–12.5)
Monocytes Relative: 7.6 %
Neutro Abs: 2240 cells/uL (ref 1500–7800)
Neutrophils Relative %: 50.9 %
Platelets: 240 10*3/uL (ref 140–400)
RBC: 4.68 10*6/uL (ref 3.80–5.10)
RDW: 12.9 % (ref 11.0–15.0)
Total Lymphocyte: 38.2 %
WBC: 4.4 10*3/uL (ref 3.8–10.8)

## 2019-07-01 LAB — COMPLETE METABOLIC PANEL WITH GFR
AG Ratio: 1.5 (calc) (ref 1.0–2.5)
ALT: 13 U/L (ref 6–29)
AST: 16 U/L (ref 10–35)
Albumin: 4 g/dL (ref 3.6–5.1)
Alkaline phosphatase (APISO): 79 U/L (ref 37–153)
BUN: 16 mg/dL (ref 7–25)
CO2: 29 mmol/L (ref 20–32)
Calcium: 9.8 mg/dL (ref 8.6–10.4)
Chloride: 104 mmol/L (ref 98–110)
Creat: 0.64 mg/dL (ref 0.50–1.05)
GFR, Est African American: 119 mL/min/{1.73_m2} (ref >=60)
GFR, Est Non African American: 103 mL/min/{1.73_m2} (ref >=60)
Globulin: 2.6 g/dL (calc) (ref 1.9–3.7)
Glucose, Bld: 89 mg/dL (ref 65–99)
Potassium: 4 mmol/L (ref 3.5–5.3)
Sodium: 141 mmol/L (ref 135–146)
Total Bilirubin: 0.5 mg/dL (ref 0.2–1.2)
Total Protein: 6.6 g/dL (ref 6.1–8.1)

## 2019-07-01 LAB — HEMOGLOBIN A1C
Hgb A1c MFr Bld: 5.7 % of total Hgb — ABNORMAL HIGH (ref <5.7)
Mean Plasma Glucose: 117 (calc)
eAG (mmol/L): 6.5 (calc)

## 2019-07-01 LAB — LIPID PANEL
Cholesterol: 218 mg/dL — ABNORMAL HIGH (ref <200)
HDL: 67 mg/dL (ref >=50)
LDL Cholesterol (Calc): 127 mg/dL (calc) — ABNORMAL HIGH
Non-HDL Cholesterol (Calc): 151 mg/dL (calc) — ABNORMAL HIGH (ref <130)
Total CHOL/HDL Ratio: 3.3 (calc) (ref <5.0)
Triglycerides: 128 mg/dL (ref <150)

## 2019-07-01 LAB — MICROALBUMIN, URINE: Microalb, Ur: 0.5 mg/dL

## 2019-07-02 ENCOUNTER — Other Ambulatory Visit: Payer: Self-pay | Admitting: Family Medicine

## 2019-07-02 DIAGNOSIS — R1032 Left lower quadrant pain: Secondary | ICD-10-CM

## 2019-07-13 ENCOUNTER — Other Ambulatory Visit: Payer: Self-pay | Admitting: Family Medicine

## 2019-07-15 ENCOUNTER — Other Ambulatory Visit: Payer: Self-pay

## 2019-07-15 ENCOUNTER — Ambulatory Visit
Admission: RE | Admit: 2019-07-15 | Discharge: 2019-07-15 | Disposition: A | Discharge status: Home / Self Care | Payer: 59 | Source: Ambulatory Visit | Attending: Family Medicine | Admitting: Family Medicine

## 2019-07-15 DIAGNOSIS — R1032 Left lower quadrant pain: Secondary | ICD-10-CM

## 2019-07-15 MED ORDER — IOPAMIDOL (ISOVUE-300) INJECTION 61%
100.0000 mL | Freq: Once | INTRAVENOUS | Status: AC | PRN
  Administered 2019-07-15: 100 mL via INTRAVENOUS

## 2019-07-16 ENCOUNTER — Encounter: Payer: Self-pay | Admitting: Family Medicine

## 2019-07-20 ENCOUNTER — Encounter: Payer: Self-pay | Admitting: Family Medicine

## 2019-07-20 ENCOUNTER — Other Ambulatory Visit: Payer: Self-pay

## 2019-07-20 ENCOUNTER — Ambulatory Visit: Payer: 59 | Admitting: Family Medicine

## 2019-07-20 VITALS — BP 110/68 | HR 94 | Temp 97.2°F | Resp 16 | Ht 61.5 in | Wt 258.0 lb

## 2019-07-20 DIAGNOSIS — D3501 Benign neoplasm of right adrenal gland: Secondary | ICD-10-CM

## 2019-07-20 DIAGNOSIS — K436 Other and unspecified ventral hernia with obstruction, without gangrene: Secondary | ICD-10-CM

## 2019-07-20 DIAGNOSIS — R1030 Lower abdominal pain, unspecified: Secondary | ICD-10-CM | POA: Diagnosis not present

## 2019-07-20 NOTE — Progress Notes (Signed)
Subjective:    Patient ID: Patricia [name], female    DOB: Aug 14, 1965, 54 y.o.   MRN: [id number]  HPI 06/30/19 Patient is a very pleasant 54 year old African-American female who presents today for complete physical exam.  However she has 2 major concerns.  First she reports a 4-week history of left lower quadrant abdominal pain.  It aches and throbs in her left lower quadrant on a daily basis.  There are no exacerbating or alleviating factors.  She denies any melena or hematochezia although her stool has been darker.  She denies any nausea or vomiting.  She denies any vaginal bleeding or dysuria or hematuria.  She saw her gynecologist who performed a pelvic ultrasound that was "normal".  She also contacted her gastroenterologist who suspects this may be due to low back pain.  The patient states that she is only having "pebbles" whenever she has a bowel movement.  She also reports increasing gas and bloating.  At that time, my plan was: Patient did poorly on her hearing screen bilaterally.  I will consult audiology as the patient is considering possibly using hearing aid.  Her abdominal pain is uncertain.  I believe this is most likely due to IBS or constipation.  I have recommended an abdominal x-ray.  If x-ray confirms constipation, I will treat the patient with Linzess to see if symptoms improve.  If x-ray is clear worsen if symptoms do not improve, I would proceed with a CT scan abdomen pelvis given severity of the pain.  Blood pressure today is well controlled.  Given her history of prediabetes, I will check a CBC, CMP, fasting lipid panel, urine microalbumin, hemoglobin A1c.  Her immunizations are up-to-date.  Colonoscopy was performed 2 years ago was normal and therefore is up-to-date.  Mammogram is up-to-date.  Pap smear is performed by gynecology.  07/20/19  IMPRESSION: 1. No CT evidence for acute intra-abdominal or pelvic abnormality 2. Descending and sigmoid colon diverticular disease without  convincing evidence for acute inflammatory process 3. 3.1 cm right adrenal adenoma 4. Fat containing ventral hernia  Patient is here today to discuss.  She does have a ventral abdominal scar midline due to her hysterectomy.  There may be a slight palpable defect just superior to the umbilicus however she has no tenderness to palpation in that area.  Her pain is usually in the left lower quadrant.  Today her abdomen is soft nondistended with normal bowel sounds.  There is no guarding or rebound.  She has had the right adrenal adenoma at least on CT scan back to 2013.  At that time it was 2.2 cm.  Today it is 3.1.  However she has no symptoms of pheochromocytoma.  She demonstrates no signs or symptoms of Cushing syndrome or hyperaldosteronism.  Her sodium and potassium levels have been normal and her blood pressure is well controlled.  Therefore I do not feel that we need to go through an exhaustive endocrinology work-up.  I believe this is a benign adenoma that is relatively stable. Past Medical History:  Diagnosis Date  . Arthritis    "spine-DDD-intermittent sciatic pain"  . Chronic cervicitis    resolved  . Diabetes mellitus without complication (HCC)    diet controlled  . GERD (gastroesophageal reflux disease)   . Heart murmur    "benign"-routinely sees yearly - Dr. Deretha Emory, cardiology  . History of kidney stones    multiple kidney stones-passed.  . Hypertension   . IBS (irritable bowel syndrome)  controlled with diet  . Menorrhagia    resolved s/p Hysterectomy  . Obesity   . Sleep apnea    cpap used nightly  . Vitamin D deficiency    Past Surgical History:  Procedure Laterality Date  . BREAST LUMPECTOMY Left    benign hamartoma  . CESAREAN SECTION     x1  . COLONOSCOPY  10/19/2005  . ENDOMETRIAL ABLATION    . ESOPHAGOGASTRODUODENOSCOPY  12/21/2008  . HYSTEROSCOPY WITH D & C     resection of endometrial polyp  . LAPAROSCOPIC GASTRIC SLEEVE RESECTION N/A 03/12/2016    Procedure: LAPAROSCOPIC GASTRIC SLEEVE RESECTION WITH UPPER ENDO;  Surgeon: Arta Bruce Kinsinger, MD;  Location: [facility];  Service: General;  Laterality: N/A;  . TUBAL LIGATION    . Uterine Polyps Removed    . VAGINAL HYSTERECTOMY     fibroids   Current Outpatient Medications on File Prior to Visit  Medication Sig Dispense Refill  . diclofenac sodium (VOLTAREN) 1 % GEL Apply 2 g topically 4 (four) times daily. 100 g 1  . fluticasone (FLONASE) 50 MCG/ACT nasal spray Place 2 sprays into both nostrils daily. 16 g 6  . KLOR-CON M20 20 MEQ tablet TAKE 1 TABLET BY MOUTH EVERY DAY 30 tablet 8  . losartan-hydrochlorothiazide (HYZAAR) 100-12.5 MG tablet Take 1 tablet by mouth daily.    . Multiple Vitamin (MULTIVITAMIN) capsule Take 1 capsule by mouth daily.    Marland Kitchen omeprazole (PRILOSEC) 40 MG capsule TAKE 1 CAPSULE BY MOUTH EVERY DAY 30 capsule 11  . Probiotic Product (PROBIOTIC DAILY PO) Take 1 tablet by mouth daily.     Current Facility-Administered Medications on File Prior to Visit  Medication Dose Route Frequency Provider Last Rate Last Admin  . 0.9 %  sodium chloride infusion  500 mL Intravenous Once Gatha Mayer, MD       No Known Allergies Social History   Socioeconomic History  . Marital status: Married    Spouse name: Montine Circle  . Number of children: 2  . Years of education: 59  . Highest education level: Not on file  Occupational History  . Occupation: QUALITY ANALYST    Employer: [employer]  Tobacco Use  . Smoking status: Never Smoker  . Smokeless tobacco: Never Used  Substance and Sexual Activity  . Alcohol use: Yes    Alcohol/week: 0.0 standard drinks    Comment: socially  . Drug use: No  . Sexual activity: Yes    Birth control/protection: Surgical  Other Topics Concern  . Not on file  Social History Narrative   Married, college education, 2 children    Teacher, early years/pre for [employer]    patient is right handed and consumes green tea daily   Never smoker  no tobacco, no drug use, only rare to occasional alcohol   Social Determinants of Radio broadcast assistant Strain:   . Difficulty of Paying Living Expenses:   Food Insecurity:   . Worried About Charity fundraiser in the Last Year:   . Arboriculturist in the Last Year:   Transportation Needs:   . Film/video editor (Medical):   Marland Kitchen Lack of Transportation (Non-Medical):   Physical Activity:   . Days of Exercise per Week:   . Minutes of Exercise per Session:   Stress:   . Feeling of Stress :   Social Connections:   . Frequency of Communication with Friends and Family:   . Frequency of Social Gatherings with  Friends and Family:   . Attends Religious Services:   . Active Member of Clubs or Organizations:   . Attends Archivist Meetings:   Marland Kitchen Marital Status:   Intimate Partner Violence:   . Fear of Current or Ex-Partner:   . Emotionally Abused:   Marland Kitchen Physically Abused:   . Sexually Abused:    Family History  Problem Relation Age of Onset  . Hypertension Father   . Hypertension Mother   . Diabetes Mother   . Coronary artery disease Mother   . Sudden death Mother   . Cancer Brother        carcinoid tumer of the appendix  . Colon cancer Neg Hx   . Esophageal cancer Neg Hx   . Stomach cancer Neg Hx   . Rectal cancer Neg Hx       Review of Systems  All other systems reviewed and are negative.      Objective:   Physical Exam Vitals reviewed.  Constitutional:      General: She is not in acute distress.    Appearance: She is well-developed. She is not diaphoretic.  HENT:     Head: Normocephalic and atraumatic.     Right Ear: External ear normal.     Left Ear: External ear normal.     Nose: Nose normal.     Mouth/Throat:     Pharynx: No oropharyngeal exudate.  Eyes:     General: No scleral icterus.       Right eye: No discharge.        Left eye: No discharge.     Conjunctiva/sclera: Conjunctivae normal.     Pupils: Pupils are equal, round, and  reactive to light.  Neck:     Thyroid: No thyromegaly.     Vascular: No JVD.     Trachea: No tracheal deviation.  Cardiovascular:     Rate and Rhythm: Normal rate and regular rhythm.     Heart sounds: Normal heart sounds. No murmur. No friction rub. No gallop.   Pulmonary:     Effort: Pulmonary effort is normal. No respiratory distress.     Breath sounds: Normal breath sounds. No stridor. No wheezing or rales.  Chest:     Chest wall: No tenderness.  Abdominal:     General: Bowel sounds are normal. There is no distension.     Palpations: Abdomen is soft. There is no mass.     Tenderness: There is no abdominal tenderness. There is no guarding or rebound.     Hernia: No hernia is present.  Musculoskeletal:     Cervical back: Normal range of motion and neck supple.  Lymphadenopathy:     Cervical: No cervical adenopathy.  Skin:    General: Skin is warm.     Coloration: Skin is not pale.     Findings: No erythema or rash.  Neurological:     Mental Status: She is alert and oriented to person, place, and time.     Cranial Nerves: No cranial nerve deficit.     Sensory: No sensory deficit.     Motor: No abnormal muscle tone.     Coordination: Coordination normal.     Deep Tendon Reflexes: Reflexes normal.  Psychiatric:        Behavior: Behavior normal.        Thought Content: Thought content normal.           Assessment & Plan:  Adrenal adenoma, right  Ventral hernia with obstruction  and without gangrene  Lower abdominal pain  Adenoma has grown 1 cm over the last 8 years.  There are no signs or symptoms of hormone excess.  Therefore I feel no further work-up is necessary other than clinical monitoring.  She does have a ventral hernia but it is small and asymptomatic.  I believe her lower abdominal pain is either due to referred pain from her back, irritable bowel syndrome, or possible abdominal adhesions.  At the present time the patient feels reassured that there are no  life-threatening causes of her abdominal pain seen on the work-up thus far and is comfortable just living with the pain for the time being.  Spent 25 minutes today with the patient reviewing her CT scan and answering her questions.

## 2019-08-06 ENCOUNTER — Other Ambulatory Visit: Payer: Self-pay | Admitting: Family Medicine

## 2019-08-20 ENCOUNTER — Telehealth: Payer: Self-pay | Admitting: Internal Medicine

## 2019-08-20 MED ORDER — HYOSCYAMINE SULFATE ER 0.375 MG PO TBCR: 1.0000 | EXTENDED_RELEASE_TABLET | Freq: Two times a day (BID) | ORAL | 0 refills | Status: DC

## 2019-08-20 NOTE — Telephone Encounter (Signed)
Patient report cramping and loose stools for approximately 2 weeks. "a flare up of my IBS" . A few years ago she was given hyoscyamine and her prescription is old.  She would like a refill of that.  Rx sent.  She will come in and see Nicoletta Ba PA on 08/31/19 2:00

## 2019-08-20 NOTE — Telephone Encounter (Signed)
Pt states that her IBS is back, she states that her stomach has been very bad for the past two weeks. She would like some advise or an appt asap. Fist avail with APP not until 6/22.

## 2019-08-31 ENCOUNTER — Other Ambulatory Visit (INDEPENDENT_AMBULATORY_CARE_PROVIDER_SITE_OTHER): Payer: 59

## 2019-08-31 ENCOUNTER — Ambulatory Visit (INDEPENDENT_AMBULATORY_CARE_PROVIDER_SITE_OTHER): Payer: 59 | Admitting: Physician Assistant

## 2019-08-31 ENCOUNTER — Encounter: Payer: Self-pay | Admitting: Physician Assistant

## 2019-08-31 VITALS — BP 110/78 | HR 70 | Ht 61.5 in | Wt 255.4 lb

## 2019-08-31 DIAGNOSIS — R197 Diarrhea, unspecified: Secondary | ICD-10-CM

## 2019-08-31 DIAGNOSIS — R109 Unspecified abdominal pain: Secondary | ICD-10-CM | POA: Diagnosis not present

## 2019-08-31 DIAGNOSIS — Z8719 Personal history of other diseases of the digestive system: Secondary | ICD-10-CM | POA: Diagnosis not present

## 2019-08-31 LAB — CBC WITH DIFFERENTIAL/PLATELET
Basophils Absolute: 0.1 10*3/uL (ref 0.0–0.1)
Basophils Relative: 1.2 % (ref 0.0–3.0)
Eosinophils Absolute: 0.1 10*3/uL (ref 0.0–0.7)
Eosinophils Relative: 1.9 % (ref 0.0–5.0)
HCT: 38.9 % (ref 36.0–46.0)
Hemoglobin: 13.2 g/dL (ref 12.0–15.0)
Lymphocytes Relative: 34.6 % (ref 12.0–46.0)
Lymphs Abs: 1.8 10*3/uL (ref 0.7–4.0)
MCHC: 33.9 g/dL (ref 30.0–36.0)
MCV: 87.5 fl (ref 78.0–100.0)
Monocytes Absolute: 0.4 10*3/uL (ref 0.1–1.0)
Monocytes Relative: 7.7 % (ref 3.0–12.0)
Neutro Abs: 2.9 10*3/uL (ref 1.4–7.7)
Neutrophils Relative %: 54.6 % (ref 43.0–77.0)
Platelets: 247 10*3/uL (ref 150.0–400.0)
RBC: 4.45 Mil/uL (ref 3.87–5.11)
RDW: 13.8 % (ref 11.5–15.5)
WBC: 5.2 10*3/uL (ref 4.0–10.5)

## 2019-08-31 LAB — C-REACTIVE PROTEIN: CRP: 3.7 mg/dL (ref 0.5–20.0)

## 2019-08-31 LAB — SEDIMENTATION RATE: Sed Rate: 61 mm/hr — ABNORMAL HIGH (ref 0–30)

## 2019-08-31 MED ORDER — HYOSCYAMINE SULFATE ER 0.375 MG PO TBCR: 1.0000 | EXTENDED_RELEASE_TABLET | Freq: Two times a day (BID) | ORAL | 6 refills | Status: DC

## 2019-08-31 NOTE — Patient Instructions (Addendum)
If you are age 53 or older, your body mass index should be between 23-30. Your Body mass index is 47.47 kg/m. If this is out of the aforementioned range listed, please consider follow up with your Primary Care Provider.  If you are age 53 or younger, your body mass index should be between 19-25. Your Body mass index is 47.47 kg/m. If this is out of the aformentioned range listed, please consider follow up with your Primary Care Provider.   Your provider has requested that you go to the basement level for lab work before leaving today. Press "B" on the elevator. The lab is located at the first door on the left as you exit the elevator.  Due to recent changes in healthcare laws, you may see the results of your imaging and laboratory studies on MyChart before your provider has had a chance to review them.  We understand that in some cases there may be results that are confusing or concerning to you. Not all laboratory results come back in the same time frame and the provider may be waiting for multiple results in order to interpret others.  Please give [place] 48 hours in order for your provider to thoroughly review all the results before contacting the office for clarification of your results.   Refills of your Hyoscyamine have been sent to your pharmacy.  Be sure to eat a bland diet. See below.   Bland Diet A bland diet consists of foods that are often soft and do not have a lot of fat, fiber, or extra seasonings. Foods without fat, fiber, or seasoning are easier for the body to digest. They are also less likely to irritate your mouth, throat, stomach, and other parts of your digestive system. A bland diet is sometimes called a BRAT diet. What is my plan? Your health care provider or food and nutrition specialist (dietitian) may recommend specific changes to your diet to prevent symptoms or to treat your symptoms. These changes may include:  Eating small meals often.  Cooking food until it is soft  enough to chew easily.  Chewing your food well.  Drinking fluids slowly.  Not eating foods that are very spicy, sour, or fatty.  Not eating citrus fruits, such as oranges and grapefruit. What do I need to know about this diet?  Eat a variety of foods from the bland diet food list.  Do not follow a bland diet longer than needed.  Ask your health care provider whether you should take vitamins or supplements. What foods can I eat? Grains  Hot cereals, such as cream of wheat. Rice. Bread, crackers, or tortillas made from refined white flour. Vegetables Canned or cooked vegetables. Mashed or boiled potatoes. Fruits  Bananas. Applesauce. Other types of cooked or canned fruit with the skin and seeds removed, such as canned peaches or pears. Meats and other proteins  Scrambled eggs. Creamy peanut butter or other nut butters. Lean, well-cooked meats, such as chicken or fish. Tofu. Soups or broths. Dairy Low-fat dairy products, such as milk, cottage cheese, or yogurt. Beverages  Water. Herbal tea. Apple juice. Fats and oils Mild salad dressings. Canola or olive oil. Sweets and desserts Pudding. Custard. Fruit gelatin. Ice cream. The items listed above may not be a complete list of recommended foods and beverages. Contact a dietitian for more options. What foods are not recommended? Grains Whole grain breads and cereals. Vegetables Raw vegetables. Fruits Raw fruits, especially citrus, berries, or dried fruits. Dairy Whole fat dairy  foods. Beverages Caffeinated drinks. Alcohol. Seasonings and condiments Strongly flavored seasonings or condiments. Hot sauce. Salsa. Other foods Spicy foods. Fried foods. Sour foods, such as pickled or fermented foods. Foods with high sugar content. Foods high in fiber. The items listed above may not be a complete list of foods and beverages to avoid. Contact a dietitian for more information. Summary  A bland diet consists of foods that are  often soft and do not have a lot of fat, fiber, or extra seasonings.  Foods without fat, fiber, or seasoning are easier for the body to digest.  Check with your health care provider to see how long you should follow this diet plan. It is not meant to be followed for long periods. This information is not intended to replace advice given to you by your health care provider. Make sure you discuss any questions you have with your health care provider. Document Revised: 04/10/2017 Document Reviewed: 04/10/2017 Elsevier Patient Education  2020 [employer].

## 2019-08-31 NOTE — Progress Notes (Signed)
Subjective:    Patient ID: Patricia [name], female    DOB: 1965/10/20, 54 y.o.   MRN: [id number]  HPI Patricia [name] is a pleasant 54 year old African-American female, established with Dr. Carlean Purl, who comes in today with 1 month history of crampy abdominal pain and diarrhea. She reports history of IBS but says she has not had a flare of symptoms in the past 2 to 3 years and that her current symptoms are significantly worse than what she has had with IBS in the past. She had undergone colonoscopy in February 2019 with finding of multiple diverticuli, otherwise negative exam and indicated for 10-year interval follow-up. She has history of hypertension, sleep apnea, obesity, and is status post remote gastric sleeve. She says she had onset about a month ago and was having diarrhea about every 1 to 1-1/2 hours including nocturnally.  She had some episodes of incontinence.  She has had associated cramping and abdominal pain as well as urgency.  No melena or hematochezia.  No nausea or vomiting, no documented fever or chills.  She is not had any known infectious exposures, no recent antibiotics, no new medications supplements etc.  No significant change in stress. She had called the office and was started on Levsin twice daily.  She says this is helpful and as long as she takes it exactly every 12 hours it helps to control her symptoms but does not alleviate them.  She is having less frequent bowel movements and less cramping both of which will recur if she skips a dose. She did have CT of the abdomen pelvis done by her PCP at onset of symptoms on 07/15/2019 and this showed postsurgical changes consistent with gastric sleeve, no dilated small bowel negative appendix, there is diverticular disease in the descending and sigmoid colon and minimal wall thickening at the distal descending and proximal sigmoid colon with no convincing acute inflammatory change, She is status post hysterectomy.  Review of Systems Pertinent  positive and negative review of systems were noted in the above HPI section.  All other review of systems was otherwise negative.  Outpatient Encounter Medications as of 08/31/2019  Medication Sig  . diclofenac sodium (VOLTAREN) 1 % GEL Apply 2 g topically 4 (four) times daily.  . fluticasone (FLONASE) 50 MCG/ACT nasal spray Place 2 sprays into both nostrils daily.  Marland Kitchen Hyoscyamine Sulfate 0.375 MG TBCR Take 1 tablet (0.375 mg total) by mouth 2 (two) times daily.  Marland Kitchen KLOR-CON M20 20 MEQ tablet TAKE 1 TABLET BY MOUTH EVERY DAY  . losartan-hydrochlorothiazide (HYZAAR) 100-12.5 MG tablet TAKE 1 TABLET BY MOUTH EVERY DAY  . Multiple Vitamin (MULTIVITAMIN) capsule Take 1 capsule by mouth daily.  Marland Kitchen omeprazole (PRILOSEC) 40 MG capsule TAKE 1 CAPSULE BY MOUTH EVERY DAY  . Probiotic Product (PROBIOTIC DAILY PO) Take 1 tablet by mouth daily.  . [DISCONTINUED] Hyoscyamine Sulfate 0.375 MG TBCR Take 1 tablet (0.375 mg total) by mouth 2 (two) times daily.   Facility-Administered Encounter Medications as of 08/31/2019  Medication  . 0.9 %  sodium chloride infusion   No Known Allergies Patient Active Problem List   Diagnosis Date Noted  . Obesity 03/12/2016  . Palpitations 07/07/2013  . Acute sinusitis 04/21/2013  . Obstructive sleep apnea (adult) (pediatric) 08/25/2012  . Hypersomnia with sleep apnea, unspecified 08/25/2012  . Bilateral lower extremity edema 11/27/2011  . Obesity, Class III, BMI 40-49.9 (morbid obesity) ([place]) 10/26/2011  . Essential hypertension 11/28/2008  . IRRITABLE BOWEL SYNDROME - diarrhea predominant 11/10/2008  Social History   Socioeconomic History  . Marital status: Married    Spouse name: Montine Circle  . Number of children: 2  . Years of education: 25  . Highest education level: Not on file  Occupational History  . Occupation: QUALITY ANALYST    Employer: [employer]  Tobacco Use  . Smoking status: Never Smoker  . Smokeless tobacco: Never Used  Substance and Sexual  Activity  . Alcohol use: Yes    Alcohol/week: 0.0 standard drinks    Comment: socially  . Drug use: No  . Sexual activity: Yes    Birth control/protection: Surgical  Other Topics Concern  . Not on file  Social History Narrative   Married, college education, 2 children    Teacher, early years/pre for [employer]    patient is right handed and consumes green tea daily   Never smoker no tobacco, no drug use, only rare to occasional alcohol   Social Determinants of Radio broadcast assistant Strain:   . Difficulty of Paying Living Expenses:   Food Insecurity:   . Worried About Charity fundraiser in the Last Year:   . Arboriculturist in the Last Year:   Transportation Needs:   . Film/video editor (Medical):   Marland Kitchen Lack of Transportation (Non-Medical):   Physical Activity:   . Days of Exercise per Week:   . Minutes of Exercise per Session:   Stress:   . Feeling of Stress :   Social Connections:   . Frequency of Communication with Friends and Family:   . Frequency of Social Gatherings with Friends and Family:   . Attends Religious Services:   . Active Member of Clubs or Organizations:   . Attends Archivist Meetings:   Marland Kitchen Marital Status:   Intimate Partner Violence:   . Fear of Current or Ex-Partner:   . Emotionally Abused:   Marland Kitchen Physically Abused:   . Sexually Abused:     Ms. Dlugosz family history includes Cancer in her brother; Coronary artery disease in her mother; Diabetes in her mother; Hypertension in her father and mother; Sudden death in her mother.      Objective:    Vitals:   08/31/19 1340  BP: 110/78  Pulse: 70    Physical Exam Well-developed well-nourished AA female  in no acute distress.  Height, [id number], BMI 47  HEENT; nontraumatic normocephalic, EOMI, PER R LA, sclera anicteric. Oropharynx;nor done Neck; supple, no JVD Cardiovascular; regular rate and rhythm with S1-S2, no murmur rub or gallop Pulmonary; Clear bilaterally Abdomen; soft,  nontender, nondistended, no palpable mass or hepatosplenomegaly, bowel sounds are active Rectal;not done today Skin; benign exam, no jaundice rash or appreciable lesions Extremities; no clubbing cyanosis or edema skin warm and dry Neuro/Psych; alert and oriented x4, grossly nonfocal mood and affect appropriate       Assessment & Plan:   #94 54 year old female with history of IBS with 1 month history of acute diarrhea and abdominal cramping, associated with urgency.  She was having greater than 10 bowel movements per day. Symptoms are improved but persisting on twice daily Levsin. Rule out infectious colitis, rule out inflammatory colitis with subtle changes noted on recent CT scan, rule out IBS-D  #2  Colon cancer surveillance-up-to-date with colonoscopy done February 2019, no polyps 3.  Diverticulosis 4.  Obesity 5.  Status post gastric sleeve 6.  Hypertension 7.  Obstructive sleep apnea  Plan; CBC with differential, c-Met, sed rate, Stool for C.  difficile PCR, GI path panel, stool for lactoferrin Continue Levsin 1 p.o. twice daily, refill sent Bland low roughage diet until symptoms have significantly improved. If labs and stool studies are not revealing will need colonoscopy with Dr. Carlean Purl.  Patricia [name] Genia Harold PA-C 08/31/2019   Cc: Susy Frizzle, MD

## 2019-09-01 ENCOUNTER — Other Ambulatory Visit: Payer: 59

## 2019-09-01 DIAGNOSIS — R197 Diarrhea, unspecified: Secondary | ICD-10-CM

## 2019-09-01 DIAGNOSIS — Z8719 Personal history of other diseases of the digestive system: Secondary | ICD-10-CM

## 2019-09-01 DIAGNOSIS — R109 Unspecified abdominal pain: Secondary | ICD-10-CM

## 2019-09-02 ENCOUNTER — Encounter: Payer: Self-pay | Admitting: Family Medicine

## 2019-09-02 LAB — FECAL LACTOFERRIN, QUANT
Fecal Lactoferrin: NEGATIVE
MICRO NUMBER:: 10566126
SPECIMEN QUALITY:: ADEQUATE

## 2019-09-03 LAB — GI PROFILE, STOOL, PCR

## 2019-09-03 LAB — CLOSTRIDIUM DIFFICILE EIA: C difficile Toxins A+B, EIA: NEGATIVE

## 2019-09-04 ENCOUNTER — Telehealth: Payer: Self-pay

## 2019-09-04 NOTE — Telephone Encounter (Signed)
Please review the labs in Epic from her recent visit. She is inquiring about them. She is using her My Chart patient portal for contact with the office.

## 2019-09-08 ENCOUNTER — Other Ambulatory Visit: Payer: Self-pay

## 2019-09-08 ENCOUNTER — Ambulatory Visit (INDEPENDENT_AMBULATORY_CARE_PROVIDER_SITE_OTHER): Payer: 59 | Admitting: Nurse Practitioner

## 2019-09-08 ENCOUNTER — Encounter: Payer: Self-pay | Admitting: Nurse Practitioner

## 2019-09-08 VITALS — BP 118/80 | HR 78 | Temp 97.6°F | Resp 18 | Wt 253.6 lb

## 2019-09-08 DIAGNOSIS — N644 Mastodynia: Secondary | ICD-10-CM

## 2019-09-08 NOTE — Progress Notes (Signed)
Established Patient Office Visit  Subjective:  Patient ID: Patricia [name], female    DOB: 02/15/1966  Age: 54 y.o. MRN: [id number]  CC: right and left breast pain for 3 weeks  HPI Patricia [name] is a 54 year old female presenting to clinic with sxs of breast pain. 3 weeks ago she noticed pain in her right breast. The pain has continued without increase or decrease but now she is also having sxs in her left breast. She denied recent increase in caffeine, usual moderate or large consumption of  Caffeine, she denied known injury, heavy lifting, or pulling. She has been unable to palpate any nodule or mass. No drainage from nipples or inversion. No rash or bruising/skin discoloration.   Past Medical History:  Diagnosis Date   Arthritis    "spine-DDD-intermittent sciatic pain"   Chronic cervicitis    resolved   Diabetes mellitus without complication (HCC)    diet controlled   GERD (gastroesophageal reflux disease)    Heart murmur    "benign"-routinely sees yearly - Dr. Deretha Emory, cardiology   History of kidney stones    multiple kidney stones-passed.   Hypertension    IBS (irritable bowel syndrome)    controlled with diet   Menorrhagia    resolved s/p Hysterectomy   Obesity    Sleep apnea    cpap used nightly   Vitamin D deficiency     Past Surgical History:  Procedure Laterality Date   BREAST LUMPECTOMY Left    benign hamartoma   CESAREAN SECTION     x1   COLONOSCOPY  10/19/2005   ENDOMETRIAL ABLATION     ESOPHAGOGASTRODUODENOSCOPY  12/21/2008   HYSTEROSCOPY WITH D & C     resection of endometrial polyp   LAPAROSCOPIC GASTRIC SLEEVE RESECTION N/A 03/12/2016   Procedure: LAPAROSCOPIC GASTRIC SLEEVE RESECTION WITH UPPER ENDO;  Surgeon: Arta Bruce Kinsinger, MD;  Location: [facility];  Service: General;  Laterality: N/A;   TUBAL LIGATION     Uterine Polyps Removed     VAGINAL HYSTERECTOMY     fibroids    Family History  Problem Relation Age of  Onset   Hypertension Father    Hypertension Mother    Diabetes Mother    Coronary artery disease Mother    Sudden death Mother    Cancer Brother        carcinoid tumer of the appendix   Colon cancer Neg Hx    Esophageal cancer Neg Hx    Stomach cancer Neg Hx    Rectal cancer Neg Hx     Social History   Socioeconomic History   Marital status: Married    Spouse name: Derrick   Number of children: 2   Years of education: 16   Highest education level: Not on file  Occupational History   Occupation: QUALITY ANALYST    Employer: [employer]  Tobacco Use   Smoking status: Never Smoker   Smokeless tobacco: Never Used  Scientific laboratory technician Use: Never used  Substance and Sexual Activity   Alcohol use: Yes    Alcohol/week: 0.0 standard drinks    Comment: socially   Drug use: No   Sexual activity: Yes    Birth control/protection: Surgical  Other Topics Concern   Not on file  Social History Narrative   Married, college education, 2 children    Teacher, early years/pre for [employer]    patient is right handed and consumes green tea daily  Never smoker no tobacco, no drug use, only rare to occasional alcohol   Social Determinants of Health   Financial Resource Strain:    Difficulty of Paying Living Expenses:   Food Insecurity:    Worried About Charity fundraiser in the Last Year:    Arboriculturist in the Last Year:   Transportation Needs:    Film/video editor (Medical):    Lack of Transportation (Non-Medical):   Physical Activity:    Days of Exercise per Week:    Minutes of Exercise per Session:   Stress:    Feeling of Stress :   Social Connections:    Frequency of Communication with Friends and Family:    Frequency of Social Gatherings with Friends and Family:    Attends Religious Services:    Active Member of Clubs or Organizations:    Attends Music therapist:    Marital Status:   Intimate Partner  Violence:    Fear of Current or Ex-Partner:    Emotionally Abused:    Physically Abused:    Sexually Abused:     Outpatient Medications Prior to Visit  Medication Sig Dispense Refill   diclofenac sodium (VOLTAREN) 1 % GEL Apply 2 g topically 4 (four) times daily. 100 g 1   fluticasone (FLONASE) 50 MCG/ACT nasal spray Place 2 sprays into both nostrils daily. 16 g 6   Hyoscyamine Sulfate 0.375 MG TBCR Take 1 tablet (0.375 mg total) by mouth 2 (two) times daily. 60 tablet 6   KLOR-CON M20 20 MEQ tablet TAKE 1 TABLET BY MOUTH EVERY DAY 30 tablet 8   losartan-hydrochlorothiazide (HYZAAR) 100-12.5 MG tablet TAKE 1 TABLET BY MOUTH EVERY DAY 30 tablet 11   Multiple Vitamin (MULTIVITAMIN) capsule Take 1 capsule by mouth daily.     omeprazole (PRILOSEC) 40 MG capsule TAKE 1 CAPSULE BY MOUTH EVERY DAY 30 capsule 11   Probiotic Product (PROBIOTIC DAILY PO) Take 1 tablet by mouth daily.     Facility-Administered Medications Prior to Visit  Medication Dose Route Frequency Provider Last Rate Last Admin   0.9 %  sodium chloride infusion  500 mL Intravenous Once Gatha Mayer, MD        No Known Allergies  ROS Review of Systems  All other systems reviewed and are negative.     Objective:    Physical Exam Vitals and nursing note reviewed.  Constitutional:      Appearance: Normal appearance.  HENT:     Head: Normocephalic.     Right Ear: External ear normal.     Left Ear: External ear normal.  Eyes:     Extraocular Movements: Extraocular movements intact.     Conjunctiva/sclera: Conjunctivae normal.     Pupils: Pupils are equal, round, and reactive to light.  Cardiovascular:     Rate and Rhythm: Normal rate.  Pulmonary:     Effort: Pulmonary effort is normal.  Musculoskeletal:        General: No swelling. Normal range of motion.     Right lower leg: No edema.     Left lower leg: No edema.  Skin:    General: Skin is warm and dry.  Neurological:     General: No focal  deficit present.     Mental Status: She is alert and oriented to person, place, and time.  Psychiatric:        Mood and Affect: Mood normal.        Thought Content: Thought  content normal.        Judgment: Judgment normal.     BP 118/80 (BP Location: Left Arm, Patient Position: Sitting, Cuff Size: Large)    Pulse 78    Temp 97.6 F (36.4 C) (Temporal)    Resp 18    Wt 253 lb 9.6 oz (115 kg)    SpO2 96%    BMI 47.14 kg/m  Wt Readings from Last 3 Encounters:  09/08/19 253 lb 9.6 oz (115 kg)  08/31/19 255 lb 6 oz (115.8 kg)  07/20/19 258 lb (117 kg)     Health Maintenance Due  Topic Date Due   Hepatitis C Screening  Never done    There are no preventive care reminders to display for this patient.  Lab Results  Component Value Date   TSH 1.570 11/11/2017   Lab Results  Component Value Date   WBC 5.2 08/31/2019   HGB 13.2 08/31/2019   HCT 38.9 08/31/2019   MCV 87.5 08/31/2019   PLT 247.0 08/31/2019   Lab Results  Component Value Date   NA 141 06/30/2019   K 4.0 06/30/2019   CO2 29 06/30/2019   GLUCOSE 89 06/30/2019   BUN 16 06/30/2019   CREATININE 0.64 06/30/2019   BILITOT 0.5 06/30/2019   ALKPHOS 96 11/11/2017   AST 16 06/30/2019   ALT 13 06/30/2019   PROT 6.6 06/30/2019   ALBUMIN 4.1 11/11/2017   CALCIUM 9.8 06/30/2019   ANIONGAP 8 03/13/2016   GFR 107.26 06/10/2012   Lab Results  Component Value Date   CHOL 218 (H) 06/30/2019   Lab Results  Component Value Date   HDL 67 06/30/2019   Lab Results  Component Value Date   LDLCALC 127 (H) 06/30/2019   Lab Results  Component Value Date   TRIG 128 06/30/2019   Lab Results  Component Value Date   CHOLHDL 3.3 06/30/2019   Lab Results  Component Value Date   HGBA1C 5.7 (H) 06/30/2019      Assessment & Plan:   Problem List Items Addressed This Visit    None    Visit Diagnoses    Breast pain in female    -  Primary   Relevant Orders   MM Digital Diagnostic Bilat    Call to make apt for  diagnostic bilateral mammogram today.  May treat discomfort with ice, wearing a supportive bra, and medication such as tylenol / ibuprofen. Avoid caffeine.   No orders of the defined types were placed in this encounter.   Follow-up: Return if symptoms worsen or fail to improve.    Annie Main, FNP

## 2019-09-10 ENCOUNTER — Other Ambulatory Visit: Payer: Self-pay

## 2019-09-10 MED ORDER — RIFAXIMIN 550 MG PO TABS: 550.0000 mg | ORAL_TABLET | Freq: Three times a day (TID) | ORAL | 0 refills | Status: AC

## 2019-09-11 ENCOUNTER — Encounter: Payer: Self-pay | Admitting: Nurse Practitioner

## 2019-09-21 ENCOUNTER — Telehealth: Payer: Self-pay

## 2019-09-21 NOTE — Telephone Encounter (Signed)
Prior Auth for Xifaxan 550 mg APPROVED through 09-30-19  [employer].  Patient id: [id number]. Reference #[id number].  Phone: [phone]. Approval sent to be scanned to chart.

## 2019-10-09 ENCOUNTER — Encounter (HOSPITAL_COMMUNITY): Payer: Self-pay

## 2019-12-17 ENCOUNTER — Other Ambulatory Visit: Payer: 59

## 2019-12-24 ENCOUNTER — Ambulatory Visit (INDEPENDENT_AMBULATORY_CARE_PROVIDER_SITE_OTHER): Payer: 59 | Admitting: *Deleted

## 2019-12-24 ENCOUNTER — Other Ambulatory Visit: Payer: Self-pay

## 2019-12-24 ENCOUNTER — Other Ambulatory Visit: Payer: 59

## 2019-12-24 DIAGNOSIS — Z23 Encounter for immunization: Secondary | ICD-10-CM | POA: Diagnosis not present

## 2019-12-24 MED ORDER — FLUTICASONE PROPIONATE 50 MCG/ACT NA SUSP: 2.0000 | Freq: Every day | NASAL | 6 refills | Status: AC

## 2019-12-28 ENCOUNTER — Ambulatory Visit (INDEPENDENT_AMBULATORY_CARE_PROVIDER_SITE_OTHER): Payer: 59 | Admitting: Family Medicine

## 2019-12-28 ENCOUNTER — Encounter: Payer: Self-pay | Admitting: Family Medicine

## 2019-12-28 ENCOUNTER — Other Ambulatory Visit: Payer: Self-pay

## 2019-12-28 VITALS — BP 119/78 | HR 60 | Ht 61.0 in | Wt 252.0 lb

## 2019-12-28 DIAGNOSIS — G4733 Obstructive sleep apnea (adult) (pediatric): Secondary | ICD-10-CM | POA: Diagnosis not present

## 2019-12-28 DIAGNOSIS — Z9989 Dependence on other enabling machines and devices: Secondary | ICD-10-CM

## 2019-12-28 NOTE — Progress Notes (Addendum)
PATIENT: BETHANNIE IGLEHART DOB: [birth year]  REASON FOR VISIT: follow up HISTORY FROM: patient  Chief Complaint  Patient presents with  . Follow-up    rm 16  . Sleep Apnea    Pt said she she has sciatic nerve problem.  Pt still had problems staying sleep all night.     HISTORY OF PRESENT ILLNESS: Today 12/28/19 TAIESHA BOVARD is a 54 y.o. female here today for follow up for OSA on CPAP. She is doing well. She is using CPAP nightly. She denies concerns with machine or supplies. She is working with PCP for concerns of sciatica. She does note waking around 3am at times. She feels that her mind will start racing.  She has tried melatonin in the past that was helpful.   Compliance report dated 11/24/2019 through 12/23/2019 reveals that she used CPAP 30 of the past 30 days for compliance of 100%. She is CPAP greater than 4 hours all 30 days. Average usage was 7 hours and 13 minutes. Residual AHI was 0.7 on 8 cm of water and an EPR of 3. There was no significant leak noted.  HISTORY: (copied from my note on 12/25/2018)  ATTIE NAWABI is a 54 y.o. female here today for follow up of OSA on CPAP. She is doing very well with her new CPAP machine.  Compliance report dated 11/24/2018 through 12/23/2018 reveals that she is using CPAP 27 out of the last 30 days for compliance of 90%.  27 days she used CPAP greater than 4 hours for compliance 90%.  Average usage was 7 hours and 15 minutes.  AHI was 0.6 on 8 cm of water and an EPR of 3.  There was no significant leak noted.  She does report going out of town for a week and that contributes to days lost.  HISTORY: (copied from Dr Guadelupe Sabin note on 09/15/2018)  Ms. Solorzano is a very pleasant 54 year old right-handed woman with an underlying medical history of irritable bowel syndrome, morbid obesity, hypertension, reflux disease as well as kidney stones, who presents for follow-up consultation of her obstructive sleep apnea, well-established on treatment with  CPAP. She is unaccompanied today. I last saw her on 08/15/2017, at which time she was stable on treatment.   Today, 09/15/2018: I reviewed her CPAP compliance data from 08/16/2018 through 09/14/2018, which is a total of 30 days, during which time she used her machine every night with percent used days greater than 4 hours at 93%, indicating excellent compliance with an average usage of 6 hours and 28 minutes, residual AHI at goal at 1.2/h, leak acceptable with a 95th percentile at 7.9 L/min on a pressure of 8 cm with EPR of 3. She reports that her supplies are expensive. She buys most of them online. She should be eligible for new machine and I explained to her and we should try to get this through her DME company, [employer]. She would be willing to give it a try.  She reports doing well. Her weight has been stable. She does exercise on a regular basis. She has had some flareup of her right-sided sciatica.  The patient's allergies, current medications, family history, past medical history, past social history, past surgical history and problem list were reviewed and updated as appropriate.   REVIEW OF SYSTEMS: Out of a complete 14 system review of symptoms, the patient complains only of the following symptoms, back pain, insomnia, and all other reviewed systems are negative.  ESS: 7  FSS:26   ALLERGIES: No Known Allergies  HOME MEDICATIONS: Outpatient Medications Prior to Visit  Medication Sig Dispense Refill  . diclofenac sodium (VOLTAREN) 1 % GEL Apply 2 g topically 4 (four) times daily. 100 g 1  . fluticasone (FLONASE) 50 MCG/ACT nasal spray Place 2 sprays into both nostrils daily. 16 g 6  . Hyoscyamine Sulfate 0.375 MG TBCR Take 1 tablet (0.375 mg total) by mouth 2 (two) times daily. 60 tablet 6  . KLOR-CON M20 20 MEQ tablet TAKE 1 TABLET BY MOUTH EVERY DAY 30 tablet 8  . losartan-hydrochlorothiazide (HYZAAR) 100-12.5 MG tablet TAKE 1 TABLET BY MOUTH EVERY DAY 30 tablet 11  .  Multiple Vitamin (MULTIVITAMIN) capsule Take 1 capsule by mouth daily.    Marland Kitchen omeprazole (PRILOSEC) 40 MG capsule TAKE 1 CAPSULE BY MOUTH EVERY DAY 30 capsule 11  . Probiotic Product (PROBIOTIC DAILY PO) Take 1 tablet by mouth daily.     Facility-Administered Medications Prior to Visit  Medication Dose Route Frequency Provider Last Rate Last Admin  . 0.9 %  sodium chloride infusion  500 mL Intravenous Once Gatha Mayer, MD        PAST MEDICAL HISTORY: Past Medical History:  Diagnosis Date  . Arthritis    "spine-DDD-intermittent sciatic pain"  . Chronic cervicitis    resolved  . Diabetes mellitus without complication (HCC)    diet controlled  . GERD (gastroesophageal reflux disease)   . Heart murmur    "benign"-routinely sees yearly - Dr. Deretha Emory, cardiology  . History of kidney stones    multiple kidney stones-passed.  . Hypertension   . IBS (irritable bowel syndrome)    controlled with diet  . Menorrhagia    resolved s/p Hysterectomy  . Obesity   . Sleep apnea    cpap used nightly  . Vitamin D deficiency     PAST SURGICAL HISTORY: Past Surgical History:  Procedure Laterality Date  . BREAST LUMPECTOMY Left    benign hamartoma  . CESAREAN SECTION     x1  . COLONOSCOPY  10/19/2005  . ENDOMETRIAL ABLATION    . ESOPHAGOGASTRODUODENOSCOPY  12/21/2008  . HYSTEROSCOPY WITH D & C     resection of endometrial polyp  . LAPAROSCOPIC GASTRIC SLEEVE RESECTION N/A 03/12/2016   Procedure: LAPAROSCOPIC GASTRIC SLEEVE RESECTION WITH UPPER ENDO;  Surgeon: Arta Bruce Kinsinger, MD;  Location: [facility];  Service: General;  Laterality: N/A;  . TUBAL LIGATION    . Uterine Polyps Removed    . VAGINAL HYSTERECTOMY     fibroids    FAMILY HISTORY: Family History  Problem Relation Age of Onset  . Hypertension Father   . Hypertension Mother   . Diabetes Mother   . Coronary artery disease Mother   . Sudden death Mother   . Cancer Brother        carcinoid tumer of the appendix    . Colon cancer Neg Hx   . Esophageal cancer Neg Hx   . Stomach cancer Neg Hx   . Rectal cancer Neg Hx     SOCIAL HISTORY: Social History   Socioeconomic History  . Marital status: Married    Spouse name: Montine Circle  . Number of children: 2  . Years of education: 57  . Highest education level: Not on file  Occupational History  . Occupation: QUALITY ANALYST    Employer: [employer]  Tobacco Use  . Smoking status: Never Smoker  . Smokeless tobacco: Never Used  Vaping Use  .  Vaping Use: Never used  Substance and Sexual Activity  . Alcohol use: Yes    Alcohol/week: 0.0 standard drinks    Comment: socially  . Drug use: No  . Sexual activity: Yes    Birth control/protection: Surgical  Other Topics Concern  . Not on file  Social History Narrative   Married, college education, 2 children    Teacher, early years/pre for [employer]    patient is right handed and consumes green tea daily   Never smoker no tobacco, no drug use, only rare to occasional alcohol   Social Determinants of Health   Financial Resource Strain:   . Difficulty of Paying Living Expenses: Not on file  Food Insecurity:   . Worried About Charity fundraiser in the Last Year: Not on file  . Ran Out of Food in the Last Year: Not on file  Transportation Needs:   . Lack of Transportation (Medical): Not on file  . Lack of Transportation (Non-Medical): Not on file  Physical Activity:   . Days of Exercise per Week: Not on file  . Minutes of Exercise per Session: Not on file  Stress:   . Feeling of Stress : Not on file  Social Connections:   . Frequency of Communication with Friends and Family: Not on file  . Frequency of Social Gatherings with Friends and Family: Not on file  . Attends Religious Services: Not on file  . Active Member of Clubs or Organizations: Not on file  . Attends Archivist Meetings: Not on file  . Marital Status: Not on file  Intimate Partner Violence:   . Fear of Current  or Ex-Partner: Not on file  . Emotionally Abused: Not on file  . Physically Abused: Not on file  . Sexually Abused: Not on file      PHYSICAL EXAM  Vitals:   12/28/19 0824  BP: 119/78  Pulse: 60  Weight: 252 lb (114.3 kg)  Height: 5\' 1"  (1.549 m)   Body mass index is 47.61 kg/m.  Generalized: Well developed, in no acute distress  Cardiology: normal rate and rhythm, no murmur noted Respiratory: clear to auscultation bilaterally  Neurological examination  Mentation: Alert oriented to time, place, history taking. Follows all commands speech and language fluent Cranial nerve II-XII: Pupils were equal round reactive to light. Extraocular movements were full, visual field were full  Motor: The motor testing reveals 5 over 5 strength of all 4 extremities. Good symmetric motor tone is noted throughout.  Gait and station: Gait is normal.   DIAGNOSTIC DATA (LABS, IMAGING, TESTING) - I reviewed patient records, labs, notes, testing and imaging myself where available.  No flowsheet data found.   Lab Results  Component Value Date   WBC 5.2 08/31/2019   HGB 13.2 08/31/2019   HCT 38.9 08/31/2019   MCV 87.5 08/31/2019   PLT 247.0 08/31/2019      Component Value Date/Time   NA 141 06/30/2019 1122   NA 138 11/11/2017 1137   K 4.0 06/30/2019 1122   CL 104 06/30/2019 1122   CO2 29 06/30/2019 1122   GLUCOSE 89 06/30/2019 1122   BUN 16 06/30/2019 1122   BUN 11 11/11/2017 1137   CREATININE 0.64 06/30/2019 1122   CALCIUM 9.8 06/30/2019 1122   PROT 6.6 06/30/2019 1122   PROT 6.6 11/11/2017 1137   ALBUMIN 4.1 11/11/2017 1137   AST 16 06/30/2019 1122   ALT 13 06/30/2019 1122   ALKPHOS 96 11/11/2017 1137  BILITOT 0.5 06/30/2019 1122   BILITOT 0.3 11/11/2017 1137   GFRNONAA 103 06/30/2019 1122   GFRAA 119 06/30/2019 1122   Lab Results  Component Value Date   CHOL 218 (H) 06/30/2019   HDL 67 06/30/2019   LDLCALC 127 (H) 06/30/2019   TRIG 128 06/30/2019   CHOLHDL 3.3  06/30/2019   Lab Results  Component Value Date   HGBA1C 5.7 (H) 06/30/2019   Lab Results  Component Value Date   VITAMINB12 >2000 (H) 11/11/2017   Lab Results  Component Value Date   TSH 1.570 11/11/2017       ASSESSMENT AND PLAN 54 y.o. year old female  has a past medical history of Arthritis, Chronic cervicitis, Diabetes mellitus without complication ([place]), GERD (gastroesophageal reflux disease), Heart murmur, History of kidney stones, Hypertension, IBS (irritable bowel syndrome), Menorrhagia, Obesity, Sleep apnea, and Vitamin D deficiency. here with     ICD-10-CM   1. OSA on CPAP  G47.33    Z99.89     Mckinze is doing very well on CPAP. Compliance report reveals excellent compliance. She was encouraged to continue using CPAP nightly and for greater than 4 hours each night. She will continue working closely with primary care for concerns of sciatica. I have suggested she consider physical therapy if needed. We have also discussed role of melatonin and insomnia. This may be helpful and have recommended a 5 mg tablet about an hour before bedtime. Sleep hygiene reviewed. Healthy lifestyle habits encouraged. She will follow-up with me in 1 year, sooner if needed. She verbalizes understanding and agreement with this plan.   No orders of the defined types were placed in this encounter.    No orders of the defined types were placed in this encounter.     I spent 15 minutes with the patient. 50% of this time was spent counseling and educating patient on plan of care and medications.    Debbora Presto, FNP-C 12/28/2019, 8:31 AM [facility] [address], [place], [place] [address] [phone]  I reviewed the above note and documentation by the Nurse Practitioner and agree with the history, exam, assessment and plan as outlined above. I was available for consultation. Star Age, MD, PhD [facility])

## 2019-12-28 NOTE — Progress Notes (Signed)
Order for cpap supplies sent to [facility] via community msg. Confirmation received that the order transmitted was successful.

## 2019-12-28 NOTE — Patient Instructions (Signed)
Please continue using your CPAP regularly. While your insurance requires that you use CPAP at least 4 hours each night on 70% of the nights, I recommend, that you not skip any nights and use it throughout the night if you can. Getting used to CPAP and staying with the treatment long term does take time and patience and discipline. Untreated obstructive sleep apnea when it is moderate to severe can have an adverse impact on cardiovascular health and raise her risk for heart disease, arrhythmias, hypertension, congestive heart failure, stroke and diabetes. Untreated obstructive sleep apnea causes sleep disruption, nonrestorative sleep, and sleep deprivation. This can have an impact on your day to day functioning and cause daytime sleepiness and impairment of cognitive function, memory loss, mood disturbance, and problems focussing. Using CPAP regularly can improve these symptoms.   Follow up in 1 year   Sleep Apnea Sleep apnea affects breathing during sleep. It causes breathing to stop for a short time or to become shallow. It can also increase the risk of:  Heart attack.  Stroke.  Being very overweight (obese).  Diabetes.  Heart failure.  Irregular heartbeat. The goal of treatment is to help you breathe normally again. What are the causes? There are three kinds of sleep apnea:  Obstructive sleep apnea. This is caused by a blocked or collapsed airway.  Central sleep apnea. This happens when the brain does not send the right signals to the muscles that control breathing.  Mixed sleep apnea. This is a combination of obstructive and central sleep apnea. The most common cause of this condition is a collapsed or blocked airway. This can happen if:  Your throat muscles are too relaxed.  Your tongue and tonsils are too large.  You are overweight.  Your airway is too small. What increases the risk?  Being overweight.  Smoking.  Having a small airway.  Being older.  Being  female.  Drinking alcohol.  Taking medicines to calm yourself (sedatives or tranquilizers).  Having family members with the condition. What are the signs or symptoms?  Trouble staying asleep.  Being sleepy or tired during the day.  Getting angry a lot.  Loud snoring.  Headaches in the morning.  Not being able to focus your mind (concentrate).  Forgetting things.  Less interest in sex.  Mood swings.  Personality changes.  Feelings of sadness (depression).  Waking up a lot during the night to pee (urinate).  Dry mouth.  Sore throat. How is this diagnosed?  Your medical history.  A physical exam.  A test that is done when you are sleeping (sleep study). The test is most often done in a sleep lab but may also be done at home. How is this treated?   Sleeping on your side.  Using a medicine to get rid of mucus in your nose (decongestant).  Avoiding the use of alcohol, medicines to help you relax, or certain pain medicines (narcotics).  Losing weight, if needed.  Changing your diet.  Not smoking.  Using a machine to open your airway while you sleep, such as: ? An oral appliance. This is a mouthpiece that shifts your lower jaw forward. ? A CPAP device. This device blows air through a mask when you breathe out (exhale). ? An EPAP device. This has valves that you put in each nostril. ? A BPAP device. This device blows air through a mask when you breathe in (inhale) and breathe out.  Having surgery if other treatments do not work. It is   important to get treatment for sleep apnea. Without treatment, it can lead to:  High blood pressure.  Coronary artery disease.  In men, not being able to have an erection (impotence).  Reduced thinking ability. Follow these instructions at home: Lifestyle  Make changes that your doctor recommends.  Eat a healthy diet.  Lose weight if needed.  Avoid alcohol, medicines to help you relax, and some pain  medicines.  Do not use any products that contain nicotine or tobacco, such as cigarettes, e-cigarettes, and chewing tobacco. If you need help quitting, ask your doctor. General instructions  Take over-the-counter and prescription medicines only as told by your doctor.  If you were given a machine to use while you sleep, use it only as told by your doctor.  If you are having surgery, make sure to tell your doctor you have sleep apnea. You may need to bring your device with you.  Keep all follow-up visits as told by your doctor. This is important. Contact a doctor if:  The machine that you were given to use during sleep bothers you or does not seem to be working.  You do not get better.  You get worse. Get help right away if:  Your chest hurts.  You have trouble breathing in enough air.  You have an uncomfortable feeling in your back, arms, or stomach.  You have trouble talking.  One side of your body feels weak.  A part of your face is hanging down. These symptoms may be an emergency. Do not wait to see if the symptoms will go away. Get medical help right away. Call your local emergency services (911 in the [place] Do not drive yourself to the hospital. Summary  This condition affects breathing during sleep.  The most common cause is a collapsed or blocked airway.  The goal of treatment is to help you breathe normally while you sleep. This information is not intended to replace advice given to you by your health care provider. Make sure you discuss any questions you have with your health care provider. Document Revised: 12/27/2017 Document Reviewed: 11/05/2017 Elsevier Patient Education  2020 [employer]  

## 2020-01-05 ENCOUNTER — Ambulatory Visit: Payer: 59 | Admitting: Family Medicine

## 2020-01-05 ENCOUNTER — Other Ambulatory Visit: Payer: Self-pay

## 2020-01-05 VITALS — BP 120/78 | HR 78 | Temp 98.4°F | Ht 61.0 in | Wt 236.0 lb

## 2020-01-05 DIAGNOSIS — R700 Elevated erythrocyte sedimentation rate: Secondary | ICD-10-CM

## 2020-01-05 DIAGNOSIS — I10 Essential (primary) hypertension: Secondary | ICD-10-CM

## 2020-01-05 DIAGNOSIS — Z1159 Encounter for screening for other viral diseases: Secondary | ICD-10-CM | POA: Diagnosis not present

## 2020-01-05 NOTE — Progress Notes (Signed)
Subjective:    Patient ID: Patricia [name], female    DOB: [birth year], 54 y.o.   MRN: [id number]  HPI Patient is a very pleasant 54 year old African-American female who is here today for a checkup.  She has a history of hypertension and prediabetes.  She is due for fasting lab work.  Recently she saw her gastroenterologist due to abdominal pain and some diarrhea.  She was diagnosed with an exacerbation of her IBS however they did check a CRP which was normal at 3.7 but her sedimentation rate was elevated at 62.  They recommended that she follow-up with [place] to have this rechecked.  Today she denies any symptoms or abnormalities other than some pain in her right knee and also occasional sciatica Past Medical History:  Diagnosis Date  . Arthritis    "spine-DDD-intermittent sciatic pain"  . Chronic cervicitis    resolved  . Diabetes mellitus without complication (HCC)    diet controlled  . GERD (gastroesophageal reflux disease)   . Heart murmur    "benign"-routinely sees yearly - Dr. Deretha Emory, cardiology  . History of kidney stones    multiple kidney stones-passed.  . Hypertension   . IBS (irritable bowel syndrome)    controlled with diet  . Menorrhagia    resolved s/p Hysterectomy  . Obesity   . Sleep apnea    cpap used nightly  . Vitamin D deficiency    Past Surgical History:  Procedure Laterality Date  . BREAST LUMPECTOMY Left    benign hamartoma  . CESAREAN SECTION     x1  . COLONOSCOPY  10/19/2005  . ENDOMETRIAL ABLATION    . ESOPHAGOGASTRODUODENOSCOPY  12/21/2008  . HYSTEROSCOPY WITH D & C     resection of endometrial polyp  . LAPAROSCOPIC GASTRIC SLEEVE RESECTION N/A 03/12/2016   Procedure: LAPAROSCOPIC GASTRIC SLEEVE RESECTION WITH UPPER ENDO;  Surgeon: Arta Bruce Kinsinger, MD;  Location: [facility];  Service: General;  Laterality: N/A;  . TUBAL LIGATION    . Uterine Polyps Removed    . VAGINAL HYSTERECTOMY     fibroids   Current Outpatient Medications on File  Prior to Visit  Medication Sig Dispense Refill  . diclofenac sodium (VOLTAREN) 1 % GEL Apply 2 g topically 4 (four) times daily. 100 g 1  . fluticasone (FLONASE) 50 MCG/ACT nasal spray Place 2 sprays into both nostrils daily. 16 g 6  . Hyoscyamine Sulfate 0.375 MG TBCR Take 1 tablet (0.375 mg total) by mouth 2 (two) times daily. 60 tablet 6  . KLOR-CON M20 20 MEQ tablet TAKE 1 TABLET BY MOUTH EVERY DAY 30 tablet 8  . losartan-hydrochlorothiazide (HYZAAR) 100-12.5 MG tablet TAKE 1 TABLET BY MOUTH EVERY DAY 30 tablet 11  . Multiple Vitamin (MULTIVITAMIN) capsule Take 1 capsule by mouth daily.    Marland Kitchen omeprazole (PRILOSEC) 40 MG capsule TAKE 1 CAPSULE BY MOUTH EVERY DAY 30 capsule 11  . Probiotic Product (PROBIOTIC DAILY PO) Take 1 tablet by mouth daily.     Current Facility-Administered Medications on File Prior to Visit  Medication Dose Route Frequency Provider Last Rate Last Admin  . 0.9 %  sodium chloride infusion  500 mL Intravenous Once Gatha Mayer, MD       No Known Allergies Social History   Socioeconomic History  . Marital status: Married    Spouse name: Montine Circle  . Number of children: 2  . Years of education: 98  . Highest education level: Not on file  Occupational History  .  Occupation: QUALITY ANALYST    Employer: [employer]  Tobacco Use  . Smoking status: Never Smoker  . Smokeless tobacco: Never Used  Vaping Use  . Vaping Use: Never used  Substance and Sexual Activity  . Alcohol use: Yes    Alcohol/week: 0.0 standard drinks    Comment: socially  . Drug use: No  . Sexual activity: Yes    Birth control/protection: Surgical  Other Topics Concern  . Not on file  Social History Narrative   Married, college education, 2 children    Teacher, early years/pre for [employer]    patient is right handed and consumes green tea daily   Never smoker no tobacco, no drug use, only rare to occasional alcohol   Social Determinants of Health   Financial Resource Strain:   .  Difficulty of Paying Living Expenses: Not on file  Food Insecurity:   . Worried About Charity fundraiser in the Last Year: Not on file  . Ran Out of Food in the Last Year: Not on file  Transportation Needs:   . Lack of Transportation (Medical): Not on file  . Lack of Transportation (Non-Medical): Not on file  Physical Activity:   . Days of Exercise per Week: Not on file  . Minutes of Exercise per Session: Not on file  Stress:   . Feeling of Stress : Not on file  Social Connections:   . Frequency of Communication with Friends and Family: Not on file  . Frequency of Social Gatherings with Friends and Family: Not on file  . Attends Religious Services: Not on file  . Active Member of Clubs or Organizations: Not on file  . Attends Archivist Meetings: Not on file  . Marital Status: Not on file  Intimate Partner Violence:   . Fear of Current or Ex-Partner: Not on file  . Emotionally Abused: Not on file  . Physically Abused: Not on file  . Sexually Abused: Not on file   Family History  Problem Relation Age of Onset  . Hypertension Father   . Hypertension Mother   . Diabetes Mother   . Coronary artery disease Mother   . Sudden death Mother   . Cancer Brother        carcinoid tumer of the appendix  . Colon cancer Neg Hx   . Esophageal cancer Neg Hx   . Stomach cancer Neg Hx   . Rectal cancer Neg Hx       Review of Systems  All other systems reviewed and are negative.      Objective:   Physical Exam Vitals reviewed.  Constitutional:      General: She is not in acute distress.    Appearance: She is well-developed. She is not diaphoretic.  HENT:     Head: Normocephalic and atraumatic.     Right Ear: External ear normal.     Left Ear: External ear normal.     Nose: Nose normal.     Mouth/Throat:     Pharynx: No oropharyngeal exudate.  Eyes:     General: No scleral icterus.       Right eye: No discharge.        Left eye: No discharge.      Conjunctiva/sclera: Conjunctivae normal.     Pupils: Pupils are equal, round, and reactive to light.  Neck:     Thyroid: No thyromegaly.     Vascular: No JVD.     Trachea: No tracheal deviation.  Cardiovascular:  Rate and Rhythm: Normal rate and regular rhythm.     Heart sounds: Normal heart sounds. No murmur heard.  No friction rub. No gallop.   Pulmonary:     Effort: Pulmonary effort is normal. No respiratory distress.     Breath sounds: Normal breath sounds. No stridor. No wheezing or rales.  Chest:     Chest wall: No tenderness.  Abdominal:     General: Bowel sounds are normal. There is no distension.     Palpations: Abdomen is soft. There is no mass.     Tenderness: There is no abdominal tenderness. There is no guarding or rebound.     Hernia: No hernia is present.  Musculoskeletal:     Cervical back: Normal range of motion and neck supple.  Lymphadenopathy:     Cervical: No cervical adenopathy.  Skin:    General: Skin is warm.     Coloration: Skin is not pale.     Findings: No erythema or rash.  Neurological:     Mental Status: She is alert and oriented to person, place, and time.     Cranial Nerves: No cranial nerve deficit.     Sensory: No sensory deficit.     Motor: No abnormal muscle tone.     Coordination: Coordination normal.     Deep Tendon Reflexes: Reflexes normal.  Psychiatric:        Behavior: Behavior normal.        Thought Content: Thought content normal.           Assessment & Plan:  Benign essential HTN - Plan: CBC with Differential/Platelet, COMPLETE METABOLIC PANEL WITH GFR, Lipid panel  Elevated sed rate - Plan: Sedimentation rate, C-reactive protein  Encounter for hepatitis C screening test for low risk patient - Plan: Hepatitis C Antibody  Blood pressure today is acceptable.  I will check a CBC, CMP, and a fasting lipid panel.  Patient is due for hepatitis C screening and I will complete that as well.  I will repeat her sedimentation  rate and C-reactive protein.  If her sedimentation rate is elevated, consider a work-up for underlying autoimmune disease of malignancy.  Hopefully this was a temporary elevation due to inflammation possibly from a stomach virus or may be even due to arthritis.  We will see if it is consistently elevated before pursuing it further

## 2020-01-06 LAB — COMPLETE METABOLIC PANEL WITH GFR
AG Ratio: 1.5 (calc) (ref 1.0–2.5)
ALT: 14 U/L (ref 6–29)
AST: 15 U/L (ref 10–35)
Albumin: 4 g/dL (ref 3.6–5.1)
Alkaline phosphatase (APISO): 78 U/L (ref 37–153)
BUN: 16 mg/dL (ref 7–25)
CO2: 25 mmol/L (ref 20–32)
Calcium: 9.6 mg/dL (ref 8.6–10.4)
Chloride: 105 mmol/L (ref 98–110)
Creat: 0.63 mg/dL (ref 0.50–1.05)
GFR, Est African American: 119 mL/min/{1.73_m2} (ref >=60)
GFR, Est Non African American: 102 mL/min/{1.73_m2} (ref >=60)
Globulin: 2.6 g/dL (calc) (ref 1.9–3.7)
Glucose, Bld: 92 mg/dL (ref 65–99)
Potassium: 4.3 mmol/L (ref 3.5–5.3)
Sodium: 142 mmol/L (ref 135–146)
Total Bilirubin: 0.4 mg/dL (ref 0.2–1.2)
Total Protein: 6.6 g/dL (ref 6.1–8.1)

## 2020-01-06 LAB — LIPID PANEL
Cholesterol: 221 mg/dL — ABNORMAL HIGH (ref <200)
HDL: 69 mg/dL (ref >=50)
LDL Cholesterol (Calc): 127 mg/dL (calc) — ABNORMAL HIGH
Non-HDL Cholesterol (Calc): 152 mg/dL (calc) — ABNORMAL HIGH (ref <130)
Total CHOL/HDL Ratio: 3.2 (calc) (ref <5.0)
Triglycerides: 136 mg/dL (ref <150)

## 2020-01-06 LAB — CBC WITH DIFFERENTIAL/PLATELET
Absolute Monocytes: 340 cells/uL (ref 200–950)
Basophils Absolute: 29 cells/uL (ref 0–200)
Basophils Relative: 0.7 %
Eosinophils Absolute: 109 cells/uL (ref 15–500)
Eosinophils Relative: 2.6 %
HCT: 41.4 % (ref 35.0–45.0)
Hemoglobin: 13.7 g/dL (ref 11.7–15.5)
Lymphs Abs: 1567 cells/uL (ref 850–3900)
MCH: 29.1 pg (ref 27.0–33.0)
MCHC: 33.1 g/dL (ref 32.0–36.0)
MCV: 88.1 fL (ref 80.0–100.0)
MPV: 10.3 fL (ref 7.5–12.5)
Monocytes Relative: 8.1 %
Neutro Abs: 2155 cells/uL (ref 1500–7800)
Neutrophils Relative %: 51.3 %
Platelets: 245 10*3/uL (ref 140–400)
RBC: 4.7 10*6/uL (ref 3.80–5.10)
RDW: 12.8 % (ref 11.0–15.0)
Total Lymphocyte: 37.3 %
WBC: 4.2 10*3/uL (ref 3.8–10.8)

## 2020-01-06 LAB — HEPATITIS C ANTIBODY
Hepatitis C Ab: NONREACTIVE
SIGNAL TO CUT-OFF: 0.01 (ref <1.00)

## 2020-01-06 LAB — SEDIMENTATION RATE: Sed Rate: 38 mm/h — ABNORMAL HIGH (ref 0–30)

## 2020-01-06 LAB — C-REACTIVE PROTEIN: CRP: 33.6 mg/L — ABNORMAL HIGH (ref <8.0)

## 2020-01-11 ENCOUNTER — Encounter: Payer: Self-pay | Admitting: Family Medicine

## 2020-01-18 ENCOUNTER — Other Ambulatory Visit: Payer: Self-pay

## 2020-01-18 ENCOUNTER — Other Ambulatory Visit: Payer: 59

## 2020-01-18 ENCOUNTER — Other Ambulatory Visit: Payer: Self-pay | Admitting: Family Medicine

## 2020-01-18 DIAGNOSIS — M791 Myalgia, unspecified site: Secondary | ICD-10-CM

## 2020-01-18 DIAGNOSIS — R109 Unspecified abdominal pain: Secondary | ICD-10-CM

## 2020-01-18 DIAGNOSIS — G8929 Other chronic pain: Secondary | ICD-10-CM

## 2020-01-20 ENCOUNTER — Other Ambulatory Visit: Payer: 59

## 2020-01-20 ENCOUNTER — Other Ambulatory Visit: Payer: Self-pay

## 2020-01-20 DIAGNOSIS — R700 Elevated erythrocyte sedimentation rate: Secondary | ICD-10-CM

## 2020-01-20 NOTE — Progress Notes (Signed)
Cardiology Office Note    Date:  01/26/2020   ID:  Patricia [name], DOB Jun 19, 1965, MRN [id number]  PCP:  Susy Frizzle, MD  Cardiologist: No primary care provider on file. EPS: None  No chief complaint on file.   History of Present Illness:  Patricia [name] is a 54 y.o. female with history of palpitations no arrhythmias on monitor in 2015, history of dyspnea with normal echo in 2017, symptoms improved with weight loss and bariatric surgery, obesity, OSA on CPAP.  Patient last had a visit with Dr. Harl Bowie 10/09/2018 at which time she was doing well.  Patient comes in for f/u. 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 Having a squeezing in her chest occurs while working, in bed. Lasts 45 sec and goes away. Walks 1 1/2 mile 4 days/week. Has chronic dyspnea on exertion that has improved over time but no chest tightness with it.  Uncle had AAA surgery. She had a CT 07/15/19 no aneurysm. Has gained 25 lbs with the pandemic. Mother died MI 80 GM died MI 47, nonsmoker. Has elevated Sed rate and CRP that PCP thinks is from IBS so having colonoscopy.   Past Medical History:  Diagnosis Date  . Arthritis    "spine-DDD-intermittent sciatic pain"  . Chronic cervicitis    resolved  . Diabetes mellitus without complication (HCC)    diet controlled  . GERD (gastroesophageal reflux disease)   . Heart murmur    "benign"-routinely sees yearly - Dr. Deretha Emory, cardiology  . History of kidney stones    multiple kidney stones-passed.  . Hypertension   . IBS (irritable bowel syndrome)    controlled with diet  . Menorrhagia    resolved s/p Hysterectomy  . Obesity   . Sleep apnea    cpap used nightly  . Vitamin D deficiency     Past Surgical History:  Procedure Laterality Date  . BREAST LUMPECTOMY Left    benign hamartoma  . CESAREAN SECTION     x1  . COLONOSCOPY  10/19/2005  . ENDOMETRIAL ABLATION    . ESOPHAGOGASTRODUODENOSCOPY  12/21/2008  . HYSTEROSCOPY WITH  D & C     resection of endometrial polyp  . LAPAROSCOPIC GASTRIC SLEEVE RESECTION N/A 03/12/2016   Procedure: LAPAROSCOPIC GASTRIC SLEEVE RESECTION WITH UPPER ENDO;  Surgeon: Arta Bruce Kinsinger, MD;  Location: [facility];  Service: General;  Laterality: N/A;  . TUBAL LIGATION    . Uterine Polyps Removed    . VAGINAL HYSTERECTOMY     fibroids    Current Medications: Current Meds  Medication Sig  . diclofenac sodium (VOLTAREN) 1 % GEL Apply 2 g topically 4 (four) times daily.  Marland Kitchen doxycycline (VIBRA-TABS) 100 MG tablet Take 100 mg by mouth 2 (two) times daily.  . fluticasone (FLONASE) 50 MCG/ACT nasal spray Place 2 sprays into both nostrils daily.  Marland Kitchen Hyoscyamine Sulfate 0.375 MG TBCR Take 1 tablet (0.375 mg total) by mouth 2 (two) times daily.  Marland Kitchen KLOR-CON M20 20 MEQ tablet TAKE 1 TABLET BY MOUTH EVERY DAY  . losartan-hydrochlorothiazide (HYZAAR) 100-12.5 MG tablet TAKE 1 TABLET BY MOUTH EVERY DAY  . Multiple Vitamin (MULTIVITAMIN) capsule Take 1 capsule by mouth daily.  Marland Kitchen omeprazole (PRILOSEC) 40 MG capsule TAKE 1 CAPSULE BY MOUTH EVERY DAY  . Probiotic Product (PROBIOTIC DAILY PO) Take 1 tablet by mouth daily.   Current Facility-Administered Medications for the 01/26/20 encounter (Office Visit) with Imogene Burn, PA-C  Medication  .  0.9 %  sodium chloride infusion     Allergies:   Patient has no known allergies.   Social History   Socioeconomic History  . Marital status: Married    Spouse name: Montine Circle  . Number of children: 2  . Years of education: 32  . Highest education level: Not on file  Occupational History  . Occupation: QUALITY ANALYST    Employer: [employer]  Tobacco Use  . Smoking status: Never Smoker  . Smokeless tobacco: Never Used  Vaping Use  . Vaping Use: Never used  Substance and Sexual Activity  . Alcohol use: Yes    Alcohol/week: 0.0 standard drinks    Comment: socially  . Drug use: No  . Sexual activity: Yes    Birth control/protection:  Surgical  Other Topics Concern  . Not on file  Social History Narrative   Married, college education, 2 children    Teacher, early years/pre for [employer]    patient is right handed and consumes green tea daily   Never smoker no tobacco, no drug use, only rare to occasional alcohol   Social Determinants of Health   Financial Resource Strain:   . Difficulty of Paying Living Expenses: Not on file  Food Insecurity:   . Worried About Charity fundraiser in the Last Year: Not on file  . Ran Out of Food in the Last Year: Not on file  Transportation Needs:   . Lack of Transportation (Medical): Not on file  . Lack of Transportation (Non-Medical): Not on file  Physical Activity:   . Days of Exercise per Week: Not on file  . Minutes of Exercise per Session: Not on file  Stress:   . Feeling of Stress : Not on file  Social Connections:   . Frequency of Communication with Friends and Family: Not on file  . Frequency of Social Gatherings with Friends and Family: Not on file  . Attends Religious Services: Not on file  . Active Member of Clubs or Organizations: Not on file  . Attends Archivist Meetings: Not on file  . Marital Status: Not on file     Family History:  The patient's   family history includes Cancer in her brother; Coronary artery disease in her mother; Diabetes in her mother; Hypertension in her father and mother; Sudden death in her mother.   ROS:   Please see the history of present illness.    ROS All other systems reviewed and are negative.   PHYSICAL EXAM:   VS:  BP 124/80   Pulse 66   Ht 5' 1.5" (1.562 m)   Wt 255 lb (115.7 kg)   SpO2 98%   BMI 47.40 kg/m   Physical Exam  GEN: Obese, in no acute distress  Neck: no JVD, carotid bruits, or masses Cardiac:RRR; 1/6 systolic murmur at the left sternal border Respiratory:  clear to auscultation bilaterally, normal work of breathing GI: soft, nontender, nondistended, + BS Ext: without cyanosis, clubbing, or  edema, Good distal pulses bilaterally Neuro:  Alert and Oriented x 3 Psych: euthymic mood, full affect  Wt Readings from Last 3 Encounters:  01/26/20 255 lb (115.7 kg)  01/05/20 236 lb (107 kg)  12/28/19 252 lb (114.3 kg)      Studies/Labs Reviewed:   EKG:  EKG is  ordered today.  The ekg ordered today demonstrates normal sinus rhythm at 74 bpm  Recent Labs: 01/05/2020: ALT 14; BUN 16; Creat 0.63; Hemoglobin 13.7; Platelets 245; Potassium 4.3;  Sodium 142   Lipid Panel    Component Value Date/Time   CHOL 221 (H) 01/05/2020 0834   CHOL 204 (H) 11/11/2017 1137   TRIG 136 01/05/2020 0834   HDL 69 01/05/2020 0834   HDL 74 11/11/2017 1137   CHOLHDL 3.2 01/05/2020 0834   VLDL 16 12/22/2013 1142   LDLCALC 127 (H) 01/05/2020 0834    Additional studies/ records that were reviewed today include:  03/2013 Event Monitor 7 Days No symptoms, normal sinus rhythm   09/2015 echo Study Conclusions   - Left ventricle: The cavity size was normal. Wall thickness was   normal. Systolic function was normal. The estimated ejection   fraction was in the range of 60% to 65%. Wall motion was normal;   there were no regional wall motion abnormalities. Left   ventricular diastolic function parameters were normal for the   patient&'s age. - Right atrium: Central venous pressure (est): 3 mm Hg. - Atrial septum: No defect or patent foramen ovale was identified. - Tricuspid valve: There was mild regurgitation. - Pulmonary arteries: PA peak pressure: 32 mm Hg (S). - Pericardium, extracardiac: There was no pericardial effusion.   Impressions:   - Normal LV wall thickness with LVEF 60-65%. Normal diastolic   function. Mild tricuspid regurgitation with PASP 32 mmHg.         ASSESSMENT:    1. Other chest pain   2. Palpitations   3. SOB (shortness of breath)   4. Severe obesity (BMI >= 40) (HCC)   5. Obstructive sleep apnea (adult) (pediatric)   6. Family history of early CAD   24.  Hyperlipidemia, unspecified hyperlipidemia type      PLAN:  In order of problems listed above:  Chest pain described as squeezing when laying in bed or at work lasting 45 seconds and eases spontaneously.  Under a lot of stress and thinks it may be related.  Walks 1-1/2 miles daily without symptoms.  Mother and grandmother both died of an MI in their 88s.  Multiple CV risk factors.  Will order coronary CTA  Palpitations no arrhythmias on previous monitor.  No recent palpitations  Dyspnea on exertion echo 2017 normal LVEF and normal diastolic function, breathing improved with weight loss after prior bariatric surgery.  Continues to have some dyspnea on exertion going up hills but improves with regular exercise.  Severe obesity has gained some weight back with pandemic.  She is trying to exercise and get the weight off again.  OSA on CPAP followed by neurology  History of early CAD her mother died of an MI at 57 grandmother died of an MI at 68 neither of them were smokers.  Hyperlipidemia LDL 127 total cholesterol 221.  With family history of chest pain will begin low-dose Lipitor 10 mg once daily.  Follow-up lipid panel and LFTs in 3 months.  Medication Adjustments/Labs and Tests Ordered: Current medicines are reviewed at length with the patient today.  Concerns regarding medicines are outlined above.  Medication changes, Labs and Tests ordered today are listed in the Patient Instructions below. Patient Instructions  Medication Instructions:  Your physician has recommended you make the following change in your medication:   START: Atorvastatin 10mg  daily  *If you need a refill on your cardiac medications before your next appointment, please call your pharmacy*   Lab Work: FLP, LFT in 3 months - BMET prior to CT Scan  If you have labs (blood work) drawn today and your tests are completely normal,  you will receive your results only by: Marland Kitchen MyChart Message (if you have MyChart) OR . A  paper copy in the mail If you have any lab test that is abnormal or we need to change your treatment, we will call you to review the results.   Testing/Procedures: Your cardiac CT will be scheduled at one of the below locations:   [facility] [address] [place], [place] [address] [phone]  [facility] [address] [place], [place] [address] [phone]  If scheduled at [facility], please arrive at the [facility] main entrance of [facility] 30 minutes prior to test start time. Proceed to the [facility] Radiology Department (first floor) to check-in and test prep.  If scheduled at [facility], please arrive 15 mins early for check-in and test prep.  Please follow these instructions carefully (unless otherwise directed):  Hold all erectile dysfunction medications at least 3 days (72 hrs) prior to test.  On the Night Before the Test: . Be sure to Drink plenty of water. . Do not consume any caffeinated/decaffeinated beverages or chocolate 12 hours prior to your test. . Do not take any antihistamines 12 hours prior to your test.  On the Day of the Test: . Drink plenty of water. Do not drink any water within one hour of the test. . Do not eat any food 4 hours prior to the test. . You may take your regular medications prior to the test.  . Take metoprolol (Lopressor) two hours prior to test. . HOLD Furosemide/Hydrochlorothiazide morning of the test. . FEMALES- please wear underwire-free bra if available                After the Test: . Drink plenty of water. . After receiving IV contrast, you may experience a mild flushed feeling. This is normal. . On occasion, you may experience a mild rash up to 24 hours after the test. This is not dangerous. If this occurs, you can take Benadryl 25 mg and increase your fluid intake. . If you experience trouble breathing, this  can be serious. If it is severe call 911 IMMEDIATELY. If it is mild, please call our office. . If you take any of these medications: Glipizide/Metformin, Avandament, Glucavance, please do not take 48 hours after completing test unless otherwise instructed.   Once we have confirmed authorization from your insurance company, we will call you to set up a date and time for your test. Based on how quickly your insurance processes prior authorizations requests, please allow up to 4 weeks to be contacted for scheduling your Cardiac CT appointment. Be advised that routine Cardiac CT appointments could be scheduled as many as 8 weeks after your provider has ordered it.  For non-scheduling related questions, please contact the cardiac imaging nurse navigator should you have any questions/concerns: Marchia Bond, Cardiac Imaging Nurse Navigator Burley Saver, Interim Cardiac Imaging Nurse [place] [facility] Direct Office Dial: [phone]   For scheduling needs, including cancellations and rescheduling, please call Vivien Rota at [phone], option 3.      Follow-Up: At [facility], you and your health needs are our priority.  As part of our continuing mission to provide you with exceptional heart care, we have created designated Provider Care Teams.  These Care Teams include your primary Cardiologist (physician) and Advanced Practice Providers (APPs -  Physician Assistants and Nurse Practitioners) who all work together  to provide you with the care you need, when you need it.  We recommend signing up for the patient portal called "MyChart".  Sign up information is provided on this After Visit Summary.  MyChart is used to connect with patients for Virtual Visits (Telemedicine).  Patients are able to view lab/test results, encounter notes, upcoming appointments, etc.  Non-urgent messages can be sent to your provider as well.   To learn more about what you can do with MyChart, go to  [email or web address].    Your next appointment:   After CT Scan  The format for your next appointment:   In Person  Provider:   Ermalinda Barrios, PA-C       Signed, Ermalinda Barrios, PA-C  01/26/2020 9:35 AM    [facility] [place], [place], [place]  [address] Phone: [phone]; Fax: [phone]

## 2020-01-21 ENCOUNTER — Encounter: Payer: Self-pay | Admitting: Family Medicine

## 2020-01-21 LAB — EXTRA LAV TOP TUBE

## 2020-01-21 LAB — FECAL GLOBIN BY IMMUNOCHEMISTRY
FECAL GLOBIN RESULT:: NOT DETECTED
MICRO NUMBER:: 11125797
SPECIMEN QUALITY:: ADEQUATE

## 2020-01-21 LAB — RHEUMATOID FACTOR: Rheumatoid fact SerPl-aCnc: <14 IU/mL (ref <14)

## 2020-01-21 LAB — ANA: Anti Nuclear Antibody (ANA): NEGATIVE

## 2020-01-21 LAB — CK: Total CK: 114 U/L (ref 29–143)

## 2020-01-25 ENCOUNTER — Other Ambulatory Visit: Payer: Self-pay

## 2020-01-25 DIAGNOSIS — G8929 Other chronic pain: Secondary | ICD-10-CM

## 2020-01-26 ENCOUNTER — Ambulatory Visit (INDEPENDENT_AMBULATORY_CARE_PROVIDER_SITE_OTHER): Payer: 59 | Admitting: Physician Assistant

## 2020-01-26 ENCOUNTER — Other Ambulatory Visit: Payer: Self-pay

## 2020-01-26 ENCOUNTER — Encounter: Payer: Self-pay | Admitting: Physician Assistant

## 2020-01-26 VITALS — BP 124/80 | HR 66 | Ht 61.5 in | Wt 255.0 lb

## 2020-01-26 DIAGNOSIS — G4733 Obstructive sleep apnea (adult) (pediatric): Secondary | ICD-10-CM

## 2020-01-26 DIAGNOSIS — R002 Palpitations: Secondary | ICD-10-CM | POA: Diagnosis not present

## 2020-01-26 DIAGNOSIS — R0789 Other chest pain: Secondary | ICD-10-CM

## 2020-01-26 DIAGNOSIS — E785 Hyperlipidemia, unspecified: Secondary | ICD-10-CM

## 2020-01-26 DIAGNOSIS — Z8249 Family history of ischemic heart disease and other diseases of the circulatory system: Secondary | ICD-10-CM

## 2020-01-26 DIAGNOSIS — R0602 Shortness of breath: Secondary | ICD-10-CM

## 2020-01-26 MED ORDER — ATORVASTATIN CALCIUM 10 MG PO TABS: 10.0000 mg | ORAL_TABLET | Freq: Every day | ORAL | 3 refills | Status: DC

## 2020-01-26 MED ORDER — METOPROLOL TARTRATE 100 MG PO TABS: ORAL_TABLET | ORAL | 0 refills | Status: DC

## 2020-01-26 NOTE — Patient Instructions (Addendum)
Medication Instructions:  Your physician has recommended you make the following change in your medication:   START: Atorvastatin 10mg  daily  *If you need a refill on your cardiac medications before your next appointment, please call your pharmacy*   Lab Work: FLP, LFT in 3 months - BMET prior to CT Scan  If you have labs (blood work) drawn today and your tests are completely normal, you will receive your results only by: Marland Kitchen MyChart Message (if you have MyChart) OR . A paper copy in the mail If you have any lab test that is abnormal or we need to change your treatment, we will call you to review the results.   Testing/Procedures: Your cardiac CT will be scheduled at one of the below locations:   [facility] [address] [place], [place] [address] [phone]  [place] [address] [place], [place] [address] [phone]  If scheduled at [facility], please arrive at the [facility] main entrance of [facility] 30 minutes prior to test start time. Proceed to the [facility] Radiology Department (first floor) to check-in and test prep.  If scheduled at [facility], please arrive 15 mins early for check-in and test prep.  Please follow these instructions carefully (unless otherwise directed):  Hold all erectile dysfunction medications at least 3 days (72 hrs) prior to test.  On the Night Before the Test: . Be sure to Drink plenty of water. . Do not consume any caffeinated/decaffeinated beverages or chocolate 12 hours prior to your test. . Do not take any antihistamines 12 hours prior to your test.  On the Day of the Test: . Drink plenty of water. Do not drink any water within one hour of the test. . Do not eat any food 4 hours prior to the test. . You may take your regular medications prior to the test.  . Take metoprolol (Lopressor) two hours prior to  test. . HOLD Furosemide/Hydrochlorothiazide morning of the test. . FEMALES- please wear underwire-free bra if available                After the Test: . Drink plenty of water. . After receiving IV contrast, you may experience a mild flushed feeling. This is normal. . On occasion, you may experience a mild rash up to 24 hours after the test. This is not dangerous. If this occurs, you can take Benadryl 25 mg and increase your fluid intake. . If you experience trouble breathing, this can be serious. If it is severe call 911 IMMEDIATELY. If it is mild, please call our office. . If you take any of these medications: Glipizide/Metformin, Avandament, Glucavance, please do not take 48 hours after completing test unless otherwise instructed.   Once we have confirmed authorization from your insurance company, we will call you to set up a date and time for your test. Based on how quickly your insurance processes prior authorizations requests, please allow up to 4 weeks to be contacted for scheduling your Cardiac CT appointment. Be advised that routine Cardiac CT appointments could be scheduled as many as 8 weeks after your provider has ordered it.  For non-scheduling related questions, please contact the cardiac imaging nurse navigator should you have any questions/concerns: Marchia Bond, Cardiac Imaging Nurse Navigator Burley Saver, Interim Cardiac Imaging Nurse [place] [facility] Direct Office Dial: [phone]   For scheduling needs, including cancellations and rescheduling, please call  Toni at [phone], option 3.      Follow-Up: At [facility], you and your health needs are our priority.  As part of our continuing mission to provide you with exceptional heart care, we have created designated Provider Care Teams.  These Care Teams include your primary Cardiologist (physician) and Advanced Practice Providers (APPs -  Physician Assistants and Nurse Practitioners)  who all work together to provide you with the care you need, when you need it.  We recommend signing up for the patient portal called "MyChart".  Sign up information is provided on this After Visit Summary.  MyChart is used to connect with patients for Virtual Visits (Telemedicine).  Patients are able to view lab/test results, encounter notes, upcoming appointments, etc.  Non-urgent messages can be sent to your provider as well.   To learn more about what you can do with MyChart, go to [email or web address].    Your next appointment:   After CT Scan  The format for your next appointment:   In Person  Provider:   Ermalinda Barrios, PA-C

## 2020-02-01 ENCOUNTER — Ambulatory Visit (INDEPENDENT_AMBULATORY_CARE_PROVIDER_SITE_OTHER): Payer: 59 | Admitting: Orthopaedic Surgery

## 2020-02-01 ENCOUNTER — Ambulatory Visit: Payer: Self-pay

## 2020-02-01 ENCOUNTER — Encounter: Payer: Self-pay | Admitting: Orthopaedic Surgery

## 2020-02-01 DIAGNOSIS — G8929 Other chronic pain: Secondary | ICD-10-CM | POA: Diagnosis not present

## 2020-02-01 DIAGNOSIS — M25561 Pain in right knee: Secondary | ICD-10-CM

## 2020-02-01 MED ORDER — LIDOCAINE HCL 1 % IJ SOLN
3.0000 mL | INTRAMUSCULAR | Status: AC | PRN
  Administered 2020-02-01: 3 mL

## 2020-02-01 MED ORDER — METHYLPREDNISOLONE ACETATE 40 MG/ML IJ SUSP
40.0000 mg | INTRAMUSCULAR | Status: AC | PRN
  Administered 2020-02-01: 40 mg via INTRA_ARTICULAR

## 2020-02-01 NOTE — Progress Notes (Signed)
Office Visit Note   Patient: Patricia [name]           Date of Birth: 26-Oct-1965           MRN: [id number] Visit Date: 02/01/2020              Requested by: Susy Frizzle, MD [address] [place],  [place] [address] PCP: Susy Frizzle, MD   Assessment & Plan: Visit Diagnoses:  1. Chronic pain of right knee     Plan: Given her obesity I have recommended weight loss because this can help take the pressure off her knee the most.  Also talked her about trying a steroid injection since she has not had this before and she agreed to this.  She did tolerate a steroid injection the right knee well.  We will have her work on weight loss and questioning the exercises and see her back in about 3 weeks to see if this is helped.  Follow-Up Instructions: Return in about 3 weeks (around 02/22/2020).   Orders:  Orders Placed This Encounter  Procedures  . Large Joint Inj  . XR Knee 1-2 Views Right   No orders of the defined types were placed in this encounter.     Procedures: Large Joint Inj: R knee on 02/01/2020 4:37 PM Indications: diagnostic evaluation and pain Details: 22 G 1.5 in needle, superolateral approach  Arthrogram: No  Medications: 3 mL lidocaine 1 %; 40 mg methylPREDNISolone acetate 40 MG/ML Outcome: tolerated well, no immediate complications Procedure, treatment alternatives, risks and benefits explained, specific risks discussed. Consent was given by the patient. Immediately prior to procedure a time out was called to verify the correct patient, procedure, equipment, support staff and site/side marked as required. Patient was prepped and draped in the usual sterile fashion.       Clinical Data: No additional findings.   Subjective: Chief Complaint  Patient presents with  . Right Knee - Pain  The patient is someone I am seeing for the first time.  She is sent here for evaluation treatment of right knee pain.  This is been hurting for the last 3 years  with no known injury.  She has never had surgery on that knee.  She does use ibuprofen and Voltaren gel for her pain.  She works on her own exercising in terms of trying to strengthen the quads and decrease her weight.  Her BMI is 47.  She has never had any type of injection for that knee either.  She reports mainly pain but no locking catching.  HPI  Review of Systems There is currently no listed headache, chest pain, shortness of breath, fever, chills, nausea, vomiting  Objective: Vital Signs: There were no vitals taken for this visit.  Physical Exam She is alert and oriented x3 and in no acute distress Ortho Exam Examination of her right knee shows some slight valgus malalignment otherwise a normal exam other than global tenderness and pain.  There is no effusion and no warmth to the knee. Specialty Comments:  No specialty comments available.  Imaging: XR Knee 1-2 Views Right  Result Date: 02/01/2020 2 views of the right knee showed no acute findings.  The joint space is still maintained but there is slight valgus malalignment.    PMFS History: Patient Active Problem List   Diagnosis Date Noted  . Obesity 03/12/2016  . Palpitations 07/07/2013  . Acute sinusitis 04/21/2013  . Obstructive sleep apnea (adult) (pediatric)  08/25/2012  . Hypersomnia with sleep apnea, unspecified 08/25/2012  . Bilateral lower extremity edema 11/27/2011  . Obesity, Class III, BMI 40-49.9 (morbid obesity) ([place]) 10/26/2011  . Essential hypertension 11/28/2008  . IRRITABLE BOWEL SYNDROME - diarrhea predominant 11/10/2008   Past Medical History:  Diagnosis Date  . Arthritis    "spine-DDD-intermittent sciatic pain"  . Chronic cervicitis    resolved  . Diabetes mellitus without complication (HCC)    diet controlled  . GERD (gastroesophageal reflux disease)   . Heart murmur    "benign"-routinely sees yearly - Dr. Deretha Emory, cardiology  . History of kidney stones    multiple kidney  stones-passed.  . Hypertension   . IBS (irritable bowel syndrome)    controlled with diet  . Menorrhagia    resolved s/p Hysterectomy  . Obesity   . Sleep apnea    cpap used nightly  . Vitamin D deficiency     Family History  Problem Relation Age of Onset  . Hypertension Father   . Hypertension Mother   . Diabetes Mother   . Coronary artery disease Mother   . Sudden death Mother   . Cancer Brother        carcinoid tumer of the appendix  . Colon cancer Neg Hx   . Esophageal cancer Neg Hx   . Stomach cancer Neg Hx   . Rectal cancer Neg Hx     Past Surgical History:  Procedure Laterality Date  . BREAST LUMPECTOMY Left    benign hamartoma  . CESAREAN SECTION     x1  . COLONOSCOPY  10/19/2005  . ENDOMETRIAL ABLATION    . ESOPHAGOGASTRODUODENOSCOPY  12/21/2008  . HYSTEROSCOPY WITH D & C     resection of endometrial polyp  . LAPAROSCOPIC GASTRIC SLEEVE RESECTION N/A 03/12/2016   Procedure: LAPAROSCOPIC GASTRIC SLEEVE RESECTION WITH UPPER ENDO;  Surgeon: Arta Bruce Kinsinger, MD;  Location: [facility];  Service: General;  Laterality: N/A;  . TUBAL LIGATION    . Uterine Polyps Removed    . VAGINAL HYSTERECTOMY     fibroids   Social History   Occupational History  . Occupation: QUALITY ANALYST    Employer: [employer]  Tobacco Use  . Smoking status: Never Smoker  . Smokeless tobacco: Never Used  Vaping Use  . Vaping Use: Never used  Substance and Sexual Activity  . Alcohol use: Yes    Alcohol/week: 0.0 standard drinks    Comment: socially  . Drug use: No  . Sexual activity: Yes    Birth control/protection: Surgical

## 2020-02-05 NOTE — Addendum Note (Signed)
Addended by: Barbarann Ehlers A on: 02/05/2020 10:32 AM   Modules accepted: Orders

## 2020-02-15 ENCOUNTER — Other Ambulatory Visit: Payer: 59

## 2020-02-24 ENCOUNTER — Ambulatory Visit: Payer: 59 | Admitting: Internal Medicine

## 2020-02-24 ENCOUNTER — Encounter: Payer: Self-pay | Admitting: Internal Medicine

## 2020-02-24 VITALS — BP 112/68 | HR 64 | Ht 61.0 in | Wt 253.0 lb

## 2020-02-24 DIAGNOSIS — K529 Noninfective gastroenteritis and colitis, unspecified: Secondary | ICD-10-CM

## 2020-02-24 DIAGNOSIS — R7989 Other specified abnormal findings of blood chemistry: Secondary | ICD-10-CM | POA: Diagnosis not present

## 2020-02-24 DIAGNOSIS — R1031 Right lower quadrant pain: Secondary | ICD-10-CM | POA: Diagnosis not present

## 2020-02-24 DIAGNOSIS — R1032 Left lower quadrant pain: Secondary | ICD-10-CM

## 2020-02-24 DIAGNOSIS — G8929 Other chronic pain: Secondary | ICD-10-CM

## 2020-02-24 MED ORDER — PEG-KCL-NACL-NASULF-NA ASC-C 100 G PO SOLR: 1.0000 | ORAL | 0 refills | Status: DC

## 2020-02-24 NOTE — Progress Notes (Signed)
Patricia [name] 54 y.o. 11-30-1965 [id number]  Assessment & Plan:   Encounter Diagnoses  Name Primary?  . Chronic diarrhea Yes  . Abdominal pain, chronic, bilateral lower quadrant   . Abnormal C-reactive protein   . Obesity, Class III, BMI 40-49.9 (morbid obesity) ([place])     Orders Placed This Encounter  Procedures  . Ambulatory referral to Gastroenterology  Evaluate chronic diarrhea and abnormal inflammatory markers with colonoscopy.  Stool studies prior did not show this i.e. negative fecal leukocytes but she has persistent issues and I agree with Dr. Dennard Schaumann that a colonoscopy is appropriate.  The risks and benefits as well as alternatives of endoscopic procedure(s) have been discussed and reviewed. All questions answered. The patient agrees to proceed.   Meds ordered this encounter  Medications  . peg 3350 powder (MOVIPREP) 100 g SOLR    Sig: Take 1 kit (200 g total) by mouth as directed.    Dispense:  1 kit    Refill:  0    Diet modification to an anti-inflammatory diet, trying to lose weight through low-carb perhaps intermittent fasting I think would be desirable.  I have asked her to go to the diet https://moore.biz/ website and to look at the information they have about failure to lose weight after bariatric surgery procedures and methods to try to overcome that.  We will discuss more later.   I appreciate the opportunity to care for this patient. CC: Susy Frizzle, MD  Subjective:   Chief Complaint: Diarrhea with abnormal sed rate and C-reactive protein  HPI The patient is a 54 year old African-American woman with obesity despite gastric sleeve surgery within the last few years, chronic abdominal pain and a current diagnosis of IBS who presents because of persistent diarrhea more so than constipation and elevated C-reactive protein and sed rate.  She had seen Amy Esterwood earlier this year with some diarrhea problems and had a work-up that included C-reactive protein  and a sed rate sed rate was up Dr. Dennard Schaumann rechecked C-reactive protein and sed rate with autoimmune labs (ANA, rheumatoid factor and total CK were negative).  The C-reactive protein came back at 33 with normal less than 8 and and sed rate was 38 which was 61 in June when she had seen [place].  C-reactive protein was 3.7 then.  She had a negative GI stool profile PCR test and a negative stool C. difficile toxin and lactoferrin.  Dr. Dennard Schaumann did and I FOBT which was also negative I think twice.  She has not seen bleeding.  She continues with a lot of abdominal cramping and diarrhea.  Wonders if she has some sort of inflammatory condition.  Regarding diet she says she has 2 eggs plus or minus yogurt in the morning a [place] sandwich at lunch and other [employer] she rarely eats out she reports.  Despite this she did not lose weight after her sleeve surgery.  She does not drink sodas.  She does put some "flavor packets" and water that probably have artificial sweeteners I am not sure. No Known Allergies Current Meds  Medication Sig  . atorvastatin (LIPITOR) 10 MG tablet Take 1 tablet (10 mg total) by mouth daily.  . diclofenac sodium (VOLTAREN) 1 % GEL Apply 2 g topically 4 (four) times daily.  . fluticasone (FLONASE) 50 MCG/ACT nasal spray Place 2 sprays into both nostrils daily.  Marland Kitchen Hyoscyamine Sulfate 0.375 MG TBCR Take 1 tablet (0.375 mg total) by mouth 2 (two) times daily.  Marland Kitchen  KLOR-CON M20 20 MEQ tablet TAKE 1 TABLET BY MOUTH EVERY DAY  . losartan-hydrochlorothiazide (HYZAAR) 100-12.5 MG tablet TAKE 1 TABLET BY MOUTH EVERY DAY  . metoprolol tartrate (LOPRESSOR) 100 MG tablet Take one tablet 2 hours prior to CT Scan  . Multiple Vitamin (MULTIVITAMIN) capsule Take 1 capsule by mouth daily.  Marland Kitchen omeprazole (PRILOSEC) 40 MG capsule TAKE 1 CAPSULE BY MOUTH EVERY DAY  . Probiotic Product (PROBIOTIC DAILY PO) Take 1 tablet by mouth daily.   Past Medical History:  Diagnosis Date  . Arthritis     "spine-DDD-intermittent sciatic pain"  . Chronic cervicitis    resolved  . Diabetes mellitus without complication (HCC)    diet controlled  . GERD (gastroesophageal reflux disease)   . Heart murmur    "benign"-routinely sees yearly - Dr. Deretha Emory, cardiology  . History of kidney stones    multiple kidney stones-passed.  . Hypertension   . IBS (irritable bowel syndrome)    controlled with diet  . Menorrhagia    resolved s/p Hysterectomy  . Obesity   . Sleep apnea    cpap used nightly  . Vitamin D deficiency    Past Surgical History:  Procedure Laterality Date  . BREAST LUMPECTOMY Left    benign hamartoma  . CESAREAN SECTION     x1  . COLONOSCOPY  10/19/2005  . ENDOMETRIAL ABLATION    . ESOPHAGOGASTRODUODENOSCOPY  12/21/2008  . HYSTEROSCOPY WITH D & C     resection of endometrial polyp  . LAPAROSCOPIC GASTRIC SLEEVE RESECTION N/A 03/12/2016   Procedure: LAPAROSCOPIC GASTRIC SLEEVE RESECTION WITH UPPER ENDO;  Surgeon: Arta Bruce Kinsinger, MD;  Location: [facility];  Service: General;  Laterality: N/A;  . TUBAL LIGATION    . Uterine Polyps Removed    . VAGINAL HYSTERECTOMY     fibroids   Social History   Social History Narrative   Married, college education, 2 children    Teacher, early years/pre for [employer]    patient is right handed and consumes green tea daily   Never smoker no tobacco, no drug use, only rare to occasional alcohol   family history includes Cancer in her brother; Coronary artery disease in her mother; Diabetes in her mother; Hypertension in her father and mother; Sudden death in her mother.   Review of Systems As per HPI  Objective:   Physical Exam BP 112/68   Pulse 64   Ht $R'5\' 1"'sW$  (1.549 m)   Wt 253 lb (114.8 kg)   BMI 47.80 kg/m  Obese nontender abdomen

## 2020-02-24 NOTE — Patient Instructions (Addendum)
You have been scheduled for a colonoscopy. Please follow written instructions given to you at your visit today.  Please pick up your prep supplies at the pharmacy within the next 1-3 days. If you use inhalers (even only as needed), please bring them with you on the day of your procedure.  Due to recent changes in healthcare laws, you may see the results of your imaging and laboratory studies on MyChart before your provider has had a chance to review them.  We understand that in some cases there may be results that are confusing or concerning to you. Not all laboratory results come back in the same time frame and the provider may be waiting for multiple results in order to interpret others.  Please give [place] 48 hours in order for your provider to thoroughly review all the results before contacting the office for clarification of your results.   Please google [email or web address] and check out the bariatric  surgery section. Some of the information is for free.  I appreciate the opportunity to care for you. Silvano Rusk, MD, [facility]

## 2020-02-25 ENCOUNTER — Encounter: Payer: Self-pay | Admitting: Orthopaedic Surgery

## 2020-02-25 ENCOUNTER — Ambulatory Visit: Payer: 59 | Admitting: Orthopaedic Surgery

## 2020-02-25 ENCOUNTER — Other Ambulatory Visit: Payer: Self-pay

## 2020-02-25 DIAGNOSIS — G8929 Other chronic pain: Secondary | ICD-10-CM | POA: Diagnosis not present

## 2020-02-25 DIAGNOSIS — M25561 Pain in right knee: Secondary | ICD-10-CM

## 2020-02-25 NOTE — Progress Notes (Signed)
The patient comes in today for continued follow-up as a relates to right knee pain.  She is someone who is very pleasant individual who has only had knee pain over the last several weeks to months.  I did place a steroid injection in her right knee and this did help some but she has a lot of lateral pain in that knee.  She is someone with a BMI over 40.  On examination both knees hyperextend.  Both knees have valgus malalignment.  The right knee which is a painful knee has significant lateral joint line tenderness and there is a positive McMurray sign to the lateral compartment.  Her x-rays do not show severe deformities of her right knee.  The joint space seems to be still well-maintained.  At this point given the failed conservative treatment including activity modification, weight loss, quad strengthening exercises and a steroid injection, a MRI is warranted of the right knee to rule out a lateral meniscal tear and to assess the cartilage in general.  I have still encouraged quad strengthening exercises and weight loss.  We will see her back after the MRI.  All questions and concerns were answered and addressed.

## 2020-02-26 ENCOUNTER — Telehealth (HOSPITAL_COMMUNITY): Payer: Self-pay | Admitting: Emergency Medicine

## 2020-02-26 NOTE — Telephone Encounter (Signed)
Reaching out to patient to offer assistance regarding upcoming cardiac imaging study; pt verbalizes understanding of appt date/time, parking situation and where to check in, pre-test NPO status and medications ordered, and verified current allergies; name and call back number provided for further questions should they arise Marchia Bond RN Navigator Cardiac Imaging [place] [facility] [phone] office [phone] cell  Pt to take 100mg  metoprolol 2 hr prior to scan

## 2020-03-01 ENCOUNTER — Ambulatory Visit (HOSPITAL_COMMUNITY)
Admission: RE | Admit: 2020-03-01 | Discharge: 2020-03-01 | Disposition: A | Discharge status: Home / Self Care | Payer: 59 | Source: Ambulatory Visit | Attending: Physician Assistant | Admitting: Physician Assistant

## 2020-03-01 ENCOUNTER — Other Ambulatory Visit: Payer: Self-pay

## 2020-03-01 DIAGNOSIS — R0789 Other chest pain: Secondary | ICD-10-CM

## 2020-03-01 LAB — POCT I-STAT CREATININE: Creatinine, Ser: 0.7 mg/dL (ref 0.44–1.00)

## 2020-03-01 MED ORDER — IOHEXOL 350 MG/ML SOLN
80.0000 mL | Freq: Once | INTRAVENOUS | Status: AC | PRN
  Administered 2020-03-01: 80 mL via INTRAVENOUS

## 2020-03-01 MED ORDER — NITROGLYCERIN 0.4 MG SL SUBL
SUBLINGUAL_TABLET | SUBLINGUAL | Status: AC
  Filled 2020-03-01: qty 2

## 2020-03-01 MED ORDER — NITROGLYCERIN 0.4 MG SL SUBL
0.8000 mg | SUBLINGUAL_TABLET | Freq: Once | SUBLINGUAL | Status: AC
  Administered 2020-03-01: 0.8 mg via SUBLINGUAL

## 2020-03-01 NOTE — Progress Notes (Unsigned)
Identify CADFEM Informed Consent   Subject Name: Patricia [name]  Subject met inclusion and exclusion criteria.  The informed consent form, study requirements and expectations were reviewed with the subject and questions and concerns were addressed prior to the signing of the consent form.  The subject verbalized understanding of the trial requirements.  The subject agreed to participate in the CADFEM trial and signed the informed consent at 1600 on 03/01/20.  The informed consent was obtained prior to performance of any protocol-specific procedures for the subject.  A copy of the signed informed consent was given to the subject and a copy was placed in the subject's medical record.   Baylen Dea

## 2020-03-15 ENCOUNTER — Other Ambulatory Visit: Payer: Self-pay | Admitting: Family Medicine

## 2020-03-21 ENCOUNTER — Encounter: Payer: Self-pay | Admitting: Internal Medicine

## 2020-03-21 ENCOUNTER — Ambulatory Visit (AMBULATORY_SURGERY_CENTER): Payer: 59 | Admitting: Internal Medicine

## 2020-03-21 ENCOUNTER — Other Ambulatory Visit: Payer: Self-pay

## 2020-03-21 VITALS — BP 132/72 | HR 54 | Temp 97.5°F | Resp 17 | Ht 61.0 in | Wt 253.0 lb

## 2020-03-21 DIAGNOSIS — K529 Noninfective gastroenteritis and colitis, unspecified: Secondary | ICD-10-CM

## 2020-03-21 DIAGNOSIS — Z0389 Encounter for observation for other suspected diseases and conditions ruled out: Secondary | ICD-10-CM

## 2020-03-21 DIAGNOSIS — K5730 Diverticulosis of large intestine without perforation or abscess without bleeding: Secondary | ICD-10-CM

## 2020-03-21 MED ORDER — SODIUM CHLORIDE 0.9 % IV SOLN: 500.0000 mL | Freq: Once | INTRAVENOUS | Status: DC

## 2020-03-21 NOTE — Op Note (Signed)
[place] Patient Name: Patricia [name] Procedure Date: 03/21/2020 4:00 PM MRN: [id number] Endoscopist: Gatha Mayer , MD Age: 54 Referring MD:  Date of Birth: 07-Oct-1965 Gender: Female Account #: [id number] Procedure:                Colonoscopy Indications:              Chronic diarrhea, Clinically significant diarrhea                            of unexplained origin elevated CRP and ESR,                            negatife lactoferrin Medicines:                Propofol per Anesthesia, Monitored Anesthesia Care Procedure:                Pre-Anesthesia Assessment:                           - Prior to the procedure, a History and Physical                            was performed, and patient medications and                            allergies were reviewed. The patient's tolerance of                            previous anesthesia was also reviewed. The risks                            and benefits of the procedure and the sedation                            options and risks were discussed with the patient.                            All questions were answered, and informed consent                            was obtained. Prior Anticoagulants: The patient has                            taken no previous anticoagulant or antiplatelet                            agents. ASA Grade Assessment: III - A patient with                            severe systemic disease. After reviewing the risks                            and benefits, the patient was deemed in  satisfactory condition to undergo the procedure.                           After obtaining informed consent, the colonoscope                            was passed under direct vision. Throughout the                            procedure, the patient's blood pressure, pulse, and                            oxygen saturations were monitored continuously. The                            Olympus CF-HQ190  [id number]) Colonoscope was                            introduced through the anus and advanced to the the                            terminal ileum, with identification of the                            appendiceal orifice and IC valve. The colonoscopy                            was performed without difficulty. The patient                            tolerated the procedure well. The quality of the                            bowel preparation was excellent. The terminal                            ileum, ileocecal valve, appendiceal orifice, and                            rectum were photographed. The bowel preparation                            used was MoviPrep via split dose instruction. Scope In: 4:13:16 PM Scope Out: 4:25:11 PM Scope Withdrawal Time: 0 hours 9 minutes 8 seconds  Total Procedure Duration: 0 hours 11 minutes 55 seconds  Findings:                 The perianal and digital rectal examinations were                            normal.                           The terminal ileum contained a few petechiae.  Biopsies were taken with a cold forceps for                            histology. Verification of patient identification                            for the specimen was done. Estimated blood loss was                            minimal.                           Multiple diverticula were found in the sigmoid                            colon, descending colon and ascending colon.                           The exam was otherwise without abnormality on                            direct and retroflexion views.                           Biopsies for histology were taken with a cold                            forceps from the ascending colon, transverse colon,                            descending colon, sigmoid colon and rectum for                            evaluation of microscopic colitis. Estimated blood                            loss was  minimal. Complications:            No immediate complications. Estimated Blood Loss:     Estimated blood loss was minimal. Impression:               - Petechia(e) in the terminal ileum. Biopsied.                           - Diverticulosis in the sigmoid colon, in the                            descending colon and in the ascending colon.                           - The examination was otherwise normal on direct                            and retroflexion views.                           -  Biopsies were taken with a cold forceps from the                            ascending colon, transverse colon, descending                            colon, sigmoid colon and rectum for evaluation of                            microscopic colitis. Recommendation:           - Patient has a contact number available for                            emergencies. The signs and symptoms of potential                            delayed complications were discussed with the                            patient. Return to normal activities tomorrow.                            Written discharge instructions were provided to the                            patient.                           - Resume previous diet.                           - Continue present medications.                           - Await pathology results.                           - Repeat colonoscopy is recommended. The                            colonoscopy date will be determined after pathology                            results from today's exam become available for                            review. Gatha Mayer, MD 03/21/2020 4:47:09 PM This report has been signed electronically.

## 2020-03-21 NOTE — Progress Notes (Signed)
Pt's states no medical or surgical changes since previsit or office visit. 

## 2020-03-21 NOTE — Progress Notes (Signed)
Called to room to assist during endoscopic procedure.  Patient ID and intended procedure confirmed with present staff. Received instructions for my participation in the procedure from the performing physician.  

## 2020-03-21 NOTE — Progress Notes (Signed)
Report to PACU, RN, vss, BBS= Clear.  

## 2020-03-21 NOTE — Patient Instructions (Signed)
Handout given for diverticulosis.  Await biopsy results.  YOU HAD AN ENDOSCOPIC PROCEDURE TODAY AT THE [facility]:   Refer to the procedure report that was given to you for any specific questions about what was found during the examination.  If the procedure report does not answer your questions, please call your gastroenterologist to clarify.  If you requested that your care partner not be given the details of your procedure findings, then the procedure report has been included in a sealed envelope for you to review at your convenience later.  YOU SHOULD EXPECT: Some feelings of bloating in the abdomen. Passage of more gas than usual.  Walking can help get rid of the air that was put into your GI tract during the procedure and reduce the bloating. If you had a lower endoscopy (such as a colonoscopy or flexible sigmoidoscopy) you may notice spotting of blood in your stool or on the toilet paper. If you underwent a bowel prep for your procedure, you may not have a normal bowel movement for a few days.  Please Note:  You might notice some irritation and congestion in your nose or some drainage.  This is from the oxygen used during your procedure.  There is no need for concern and it should clear up in a day or so.  SYMPTOMS TO REPORT IMMEDIATELY:   Following lower endoscopy (colonoscopy or flexible sigmoidoscopy):  Excessive amounts of blood in the stool  Significant tenderness or worsening of abdominal pains  Swelling of the abdomen that is new, acute  Fever of 100F or higher  For urgent or emergent issues, a gastroenterologist can be reached at any hour by calling [phone]. Do not use MyChart messaging for urgent concerns.    DIET:  We do recommend a small meal at first, but then you may proceed to your regular diet.  Drink plenty of fluids but you should avoid alcoholic beverages for 24 hours.  ACTIVITY:  You should plan to take it easy for the rest of today and you  should NOT DRIVE or use heavy machinery until tomorrow (because of the sedation medicines used during the test).    FOLLOW UP: Our staff will call the number listed on your records 48-72 hours following your procedure to check on you and address any questions or concerns that you may have regarding the information given to you following your procedure. If we do not reach you, we will leave a message.  We will attempt to reach you two times.  During this call, we will ask if you have developed any symptoms of COVID 19. If you develop any symptoms (ie: fever, flu-like symptoms, shortness of breath, cough etc.) before then, please call [phone].  If you test positive for Covid 19 in the 2 weeks post procedure, please call and report this information to us.    If any biopsies were taken you will be contacted by phone or by letter within the next 1-3 weeks.  Please call us at [phone] if you have not heard about the biopsies in 3 weeks.    SIGNATURES/CONFIDENTIALITY: You and/or your care partner have signed paperwork which will be entered into your electronic medical record.  These signatures attest to the fact that that the information above on your After Visit Summary has been reviewed and is understood.  Full responsibility of the confidentiality of this discharge information lies with you and/or your care-partner. 

## 2020-03-22 ENCOUNTER — Other Ambulatory Visit: Payer: 59

## 2020-03-22 ENCOUNTER — Ambulatory Visit
Admission: RE | Admit: 2020-03-22 | Discharge: 2020-03-22 | Disposition: A | Discharge status: Home / Self Care | Payer: 59 | Source: Ambulatory Visit | Attending: Orthopaedic Surgery | Admitting: Orthopaedic Surgery

## 2020-03-22 ENCOUNTER — Telehealth: Payer: Self-pay | Admitting: *Deleted

## 2020-03-22 DIAGNOSIS — G8929 Other chronic pain: Secondary | ICD-10-CM

## 2020-03-22 NOTE — Telephone Encounter (Signed)
  Follow up Call-  Call back number 03/21/2020  Post procedure Call Back phone  # [phone]  Permission to leave phone message Yes  Some recent data might be hidden     Patient questions:  Do you have a fever, pain , or abdominal swelling? No. Pain Score  0 *  Have you tolerated food without any problems? Yes.    Have you been able to return to your normal activities? Yes.    Do you have any questions about your discharge instructions: Diet   No. Medications  No. Follow up visit  No.  Do you have questions or concerns about your Care? No.  Actions: * If pain score is 4 or above: 1. No action needed, pain <4.Have you developed a fever since your procedure? no  2.   Have you had an respiratory symptoms (SOB or cough) since your procedure? no  3.   Have you tested positive for COVID 19 since your procedure no  4.   Have you had any family members/close contacts diagnosed with the COVID 19 since your procedure?  no   If yes to any of these questions please route to Laverna Peace, RN and Karlton Lemon, RN

## 2020-03-31 ENCOUNTER — Encounter: Payer: Self-pay | Admitting: Internal Medicine

## 2020-04-06 ENCOUNTER — Encounter: Payer: Self-pay | Admitting: Orthopaedic Surgery

## 2020-04-06 ENCOUNTER — Other Ambulatory Visit: Payer: Self-pay

## 2020-04-06 ENCOUNTER — Ambulatory Visit (INDEPENDENT_AMBULATORY_CARE_PROVIDER_SITE_OTHER): Payer: 59 | Admitting: Orthopaedic Surgery

## 2020-04-06 DIAGNOSIS — M25561 Pain in right knee: Secondary | ICD-10-CM

## 2020-04-06 DIAGNOSIS — G8929 Other chronic pain: Secondary | ICD-10-CM

## 2020-04-06 NOTE — Progress Notes (Signed)
The patient comes in today to go over MRI of her right knee.  She is a very pleasant 55 year old female who has been having chronic right knee pain.  She had failed conservative treatment for that knee so an MRI was warranted at this point to assess for an internal derangement.  Examination right knee shows mainly lateral tenderness.  The patella does track slightly laterally with some patellofemoral grinding.  The MRI was reviewed with her.  The meniscus are intact.  There is some mild thinning of the articular cartilage in the weightbearing surface of the medial compartment of the knee but no full-thickness cartilage defects.  There is edema in Hoffa's fat pad and evidence that the patella does track laterally.  She is someone that would benefit from outpatient physical therapy for VMO strengthening of the right knee and any modalities that can help the patella tracked better.  Certainly weight loss will help quite a bit.  Her BMI is over 40.  I would like to see her back in 2 months to see how course of physical therapy does for her knee.  All questions and concerns were answered and addressed.

## 2020-04-19 ENCOUNTER — Encounter: Payer: Self-pay | Admitting: Physical Therapy

## 2020-04-19 ENCOUNTER — Other Ambulatory Visit: Payer: Self-pay

## 2020-04-19 ENCOUNTER — Ambulatory Visit: Payer: 59 | Admitting: Physical Therapy

## 2020-04-19 DIAGNOSIS — M25561 Pain in right knee: Secondary | ICD-10-CM | POA: Diagnosis not present

## 2020-04-19 DIAGNOSIS — R262 Difficulty in walking, not elsewhere classified: Secondary | ICD-10-CM

## 2020-04-19 DIAGNOSIS — R29810 Facial weakness: Secondary | ICD-10-CM | POA: Diagnosis not present

## 2020-04-19 DIAGNOSIS — G8929 Other chronic pain: Secondary | ICD-10-CM | POA: Diagnosis not present

## 2020-04-19 NOTE — Patient Instructions (Signed)
Access Code: [email or web address] URL: [email or web address] Date: 04/19/2020 Prepared by: Jennifer Martin  Exercises Supine Bridge - 2 x daily - 7 x weekly - 2 sets - 10 reps - 5 seconds hold Supine Hip Adduction Isometric with Ball - 2 x daily - 7 x weekly - 2 sets - 10 reps - 5 seconds hold Supine Active Straight Leg Raise - 2 x daily - 7 x weekly - 2 sets - 10 reps Supine Lower Trunk Rotation - 2 x daily - 7 x weekly - 5 reps - 20 seconds hold Supine Piriformis Stretch with Foot on Ground - 2 x daily - 7 x weekly - 5 reps - 20 seconds hold                 

## 2020-04-19 NOTE — Therapy (Signed)
[facility] [address] [place], [place], [address] Phone: [phone]   Fax:  [phone]  Physical Therapy Evaluation  Patient Details  Name: Patricia [name] MRN: [id number] Date of Birth: 08-Aug-1965 Referring Provider (PT): Jean Rosenthal MD   Encounter Date: 04/19/2020   PT End of Session - 04/19/20 1707    Visit Number 1    Number of Visits 13    Date for PT Re-Evaluation 06/03/20    PT Start Time 0845    PT Stop Time 0930    PT Time Calculation (min) 45 min    Activity Tolerance Patient tolerated treatment well    Behavior During Therapy [facility] for tasks assessed/performed           Past Medical History:  Diagnosis Date  . Arthritis    "spine-DDD-intermittent sciatic pain"  . Chronic cervicitis    resolved  . Diabetes mellitus without complication (HCC)    diet controlled  . GERD (gastroesophageal reflux disease)   . Heart murmur    "benign"-routinely sees yearly - Dr. Deretha Emory, cardiology  . History of kidney stones    multiple kidney stones-passed.  . Hypertension   . IBS (irritable bowel syndrome)    controlled with diet  . Menorrhagia    resolved s/p Hysterectomy  . Obesity   . Sleep apnea    cpap used nightly  . Vitamin D deficiency     Past Surgical History:  Procedure Laterality Date  . BREAST LUMPECTOMY Left    benign hamartoma  . CESAREAN SECTION     x1  . COLONOSCOPY  10/19/2005  . ENDOMETRIAL ABLATION    . ESOPHAGOGASTRODUODENOSCOPY  12/21/2008  . HYSTEROSCOPY WITH D & C     resection of endometrial polyp  . LAPAROSCOPIC GASTRIC SLEEVE RESECTION N/A 03/12/2016   Procedure: LAPAROSCOPIC GASTRIC SLEEVE RESECTION WITH UPPER ENDO;  Surgeon: Arta Bruce Kinsinger, MD;  Location: [facility];  Service: General;  Laterality: N/A;  . TUBAL LIGATION    . Uterine Polyps Removed    . VAGINAL HYSTERECTOMY     fibroids    There were no vitals filed for this visit.    Subjective Assessment - 04/19/20  0906    Subjective Pt arriving to therapy reporting chronic knee pain and low back pain. Pt has been using a gel that helps some but doesn't the pain.    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 Yes    Pain Score 7     Pain Location Knee    Pain Orientation Right    Pain Descriptors / Indicators Sharp;Aching;Tightness    Pain Type Chronic pain    Pain Onset More than a month ago    Pain Frequency Intermittent    Aggravating Factors  walking    Pain Relieving Factors sitting    Effect of Pain on Daily Activities difficulty with ALD's and community mobility              [facility] PT Assessment - 04/19/20 0001      Assessment   Medical Diagnosis M25.56    Referring Provider (PT) Jean Rosenthal MD    Hand Dominance Right    Next MD Visit follow up after therapy      Precautions   Precautions None  Restrictions   Weight Bearing Restrictions No      Balance Screen   Has the patient fallen in the past 6 months No    Is the patient reluctant to leave their home because of a fear of falling?  No      Home Environment   Living Environment Private residence    Type of [place] Access Level entry    Home Layout Two level    Alternate Level Stairs-Number of Steps 15    Alternate Level Stairs-Rails Left      Prior Function   Level of Independence Independent      Cognition   Overall Cognitive Status Within Functional Limits for tasks assessed      Observation/Other Assessments   Focus on Therapeutic Outcomes (FOTO)  62% (predicted 68%)      ROM / Strength   AROM / PROM / Strength AROM;Strength      AROM   AROM Assessment Site Knee    Right/Left Knee Right;Left    Right Knee Extension 0    Right Knee Flexion 120    Left Knee Extension 2   2 degrees  hyperextension   Left Knee Flexion 115      Strength   Overall Strength Deficits    Overall Strength Comments R hip abd/add: 4/5,    Strength Assessment Site Knee    Right/Left Knee Right;Left    Right Knee Flexion 4-/5    Right Knee Extension 4-/5    Left Knee Flexion 5/5    Left Knee Extension 5/5      Palpation   Palpation comment TTP, R knee anterior joint line      Transfers   Five time sit to stand comments  15.84 seconds using UE support      Ambulation/Gait   Assistive device None    Gait Pattern Step-through pattern;Decreased step length - right;Decreased stance time - right;Decreased hip/knee flexion - right;Antalgic    Ambulation Surface Level;Indoor                      Objective measurements completed on examination: See above findings.               PT Education - 04/19/20 1706    Education Details PT POC, HEP    Person(s) Educated Patient    Methods Explanation;Handout;Demonstration;Verbal cues    Comprehension Verbalized understanding;Returned demonstration;Need further instruction            PT Short Term Goals - 04/19/20 1708      PT SHORT TERM GOAL #1   Title Pt will be independent with her HEP.    Time 3    Period Weeks    Status New    Target Date 05/13/20      PT SHORT TERM GOAL #2   Title Pt will improve her sit to stand to </= 14 seconds with no UE support.    Time 3    Period Weeks    Status New             PT Long Term Goals - 04/19/20 1709      PT LONG TERM GOAL #1   Title Pt will improve her FOTO score from % function to >/= % function.    Time 6    Period Weeks    Status New    Target Date 06/03/20      PT LONG TERM GOAL #2  Title Pt will be able to improve her R LE strength to >/= 4+/5 to improve functional mobility.    Time 6    Period Weeks    Status New    Target Date 06/03/20      PT LONG TERM GOAL #3   Title Pt will be able to amb 1000 feet with normalized gait pattern with pain </=  2/10.    Time 6    Period Weeks    Status New    Target Date 06/03/20      PT LONG TERM GOAL #4   Title Pt will be able to climb a flight of stairs with pain </= 2/10 with single hand rail.    Time 6    Period Weeks    Status New    Target Date 06/03/20                  Plan - 04/19/20 1714    Clinical Impression Statement Pt arriving to therapy reporting chronic R knee pain. MRI revealed pattellafemoral edema and medial patella femoral compartment degenerative chondrosis. Pt stating sitting is better and walking is 7/10. Pt presenting with weakness in R LE compared to left. AROM is WFL with pain noted on the R knee flexion. Skilled PT needed to address pt's impairments with the below interventions.    Examination-Activity Limitations Stand;Other;Transfers;Lift;Squat;Stairs    Examination-Participation Restrictions Other;Community Activity    Clinical Decision Making Low    Rehab Potential Good    PT Frequency 2x / week    PT Duration 6 weeks    PT Treatment/Interventions Cryotherapy;Electrical Stimulation;Iontophoresis 4mg /ml Dexamethasone;Moist Heat;Ultrasound;Therapeutic activities;Functional mobility training;Stair training;Gait training;Therapeutic exercise;Balance training;Neuromuscular re-education;Patient/family education;Manual techniques;Passive range of motion;Dry needling;Taping    PT Next Visit Plan Further access Lumbar pain, R knee mobs, LE strengthening    PT Home Exercise Plan Access Code: [email or web address]    Consulted and Agree with Plan of Care Patient           Patient will benefit from skilled therapeutic intervention in order to improve the following deficits and impairments:  Obesity,Pain,Decreased mobility,Decreased strength,Decreased range of motion,Difficulty walking,Decreased activity tolerance,Impaired flexibility  Visit Diagnosis: Chronic pain of right knee  Difficulty in walking, not elsewhere classified  Facial weakness     Problem  List Patient Active Problem List   Diagnosis Date Noted  . Obesity 03/12/2016  . Palpitations 07/07/2013  . Acute sinusitis 04/21/2013  . Obstructive sleep apnea (adult) (pediatric) 08/25/2012  . Hypersomnia with sleep apnea, unspecified 08/25/2012  . Bilateral lower extremity edema 11/27/2011  . Obesity, Class III, BMI 40-49.9 (morbid obesity) ([place]) 10/26/2011  . Essential hypertension 11/28/2008  . IRRITABLE BOWEL SYNDROME - diarrhea predominant 11/10/2008    Oretha Caprice, PT, MPT 04/19/2020, 5:35 PM  [facility] [address] [place], [place], [address] Phone: [phone]   Fax:  [phone]  Name: Patricia [name] MRN: [id number] Date of Birth: 05-18-1965

## 2020-04-26 ENCOUNTER — Encounter: Payer: Self-pay | Admitting: Physical Therapy

## 2020-04-26 ENCOUNTER — Other Ambulatory Visit: Payer: Self-pay

## 2020-04-26 ENCOUNTER — Ambulatory Visit: Payer: 59 | Admitting: Physical Therapy

## 2020-04-26 DIAGNOSIS — M25561 Pain in right knee: Secondary | ICD-10-CM | POA: Diagnosis not present

## 2020-04-26 DIAGNOSIS — R262 Difficulty in walking, not elsewhere classified: Secondary | ICD-10-CM | POA: Diagnosis not present

## 2020-04-26 DIAGNOSIS — R29810 Facial weakness: Secondary | ICD-10-CM

## 2020-04-26 DIAGNOSIS — G8929 Other chronic pain: Secondary | ICD-10-CM

## 2020-04-26 NOTE — Therapy (Signed)
[facility] Physical Therapy [address] [place], [place], [address] Phone: [phone]   Fax:  [phone]  Physical Therapy Treatment  Patient Details  Name: Patricia [name] MRN: [id number] Date of Birth: 25-Jul-1965 Referring Provider (PT): Jean Rosenthal MD   Encounter Date: 04/26/2020   PT End of Session - 04/26/20 0858    Visit Number 2    Number of Visits 13    Date for PT Re-Evaluation 06/03/20    PT Start Time 0845    PT Stop Time 0925    PT Time Calculation (min) 40 min    Activity Tolerance Patient tolerated treatment well    Behavior During Therapy [facility] for tasks assessed/performed           Past Medical History:  Diagnosis Date  . Arthritis    "spine-DDD-intermittent sciatic pain"  . Chronic cervicitis    resolved  . Diabetes mellitus without complication (HCC)    diet controlled  . GERD (gastroesophageal reflux disease)   . Heart murmur    "benign"-routinely sees yearly - Dr. Deretha Emory, cardiology  . History of kidney stones    multiple kidney stones-passed.  . Hypertension   . IBS (irritable bowel syndrome)    controlled with diet  . Menorrhagia    resolved s/p Hysterectomy  . Obesity   . Sleep apnea    cpap used nightly  . Vitamin D deficiency     Past Surgical History:  Procedure Laterality Date  . BREAST LUMPECTOMY Left    benign hamartoma  . CESAREAN SECTION     x1  . COLONOSCOPY  10/19/2005  . ENDOMETRIAL ABLATION    . ESOPHAGOGASTRODUODENOSCOPY  12/21/2008  . HYSTEROSCOPY WITH D & C     resection of endometrial polyp  . LAPAROSCOPIC GASTRIC SLEEVE RESECTION N/A 03/12/2016   Procedure: LAPAROSCOPIC GASTRIC SLEEVE RESECTION WITH UPPER ENDO;  Surgeon: Arta Bruce Kinsinger, MD;  Location: [facility];  Service: General;  Laterality: N/A;  . TUBAL LIGATION    . Uterine Polyps Removed    . VAGINAL HYSTERECTOMY     fibroids    There were no vitals filed for this visit.   Subjective Assessment - 04/26/20  0844    Subjective Pt arriving to therapy reporting no pain. Pt reporting she really likes using the [employer] app on her phone with her HEP.    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 No/denies    Pain Location Knee    Pain Orientation Right    Pain Type Chronic pain    Pain Onset More than a month ago                             [facility] Adult PT Treatment/Exercise - 04/26/20 0001      Exercises   Exercises Knee/Hip      Knee/Hip Exercises: Stretches   Active Hamstring Stretch Both;2 reps;30 seconds      Knee/Hip Exercises: Aerobic   Recumbent Bike L2 x 8 minutes      Knee/Hip Exercises: Standing   Heel Raises 20 reps    Heel Raises Limitations UE support    Forward Step Up Right;10 reps    Rocker Board 2 minutes    Rocker Board Limitations UE  support as needed    Other Standing Knee Exercises Airex: 1/2 tandum stance x 1 minutes each LE in front      Knee/Hip Exercises: Seated   Long Arc Quad AROM;Both;2 sets;10 reps    Sit to [employer] 10 reps;2 sets      Knee/Hip Exercises: Supine   Bridges Strengthening;Both;2 sets;10 reps    Straight Leg Raises Right;Strengthening;2 sets;10 reps    Other Supine Knee/Hip Exercises clams with green theraband 2x10      Knee/Hip Exercises: Sidelying   Hip ABduction Right;2 sets;10 reps    Clams 2x10      Knee/Hip Exercises: Prone   Hamstring Curl 2 sets;10 reps                    PT Short Term Goals - 04/26/20 0901      PT SHORT TERM GOAL #1   Title Pt will be independent with her HEP.    Status On-going    Target Date 05/13/20      PT SHORT TERM GOAL #2   Title Pt will improve her sit to stand to </= 14 seconds with no UE support.    Status On-going    Target Date 05/13/20             PT Long Term  Goals - 04/19/20 1709      PT LONG TERM GOAL #1   Title Pt will improve her FOTO score from 62% function to >/= 68% function.    Time 6    Period Weeks    Status New    Target Date 06/03/20      PT LONG TERM GOAL #2   Title Pt will be able to improve her R LE strength to >/= 4+/5 to improve functional mobility.    Time 6    Period Weeks    Status New    Target Date 06/03/20      PT LONG TERM GOAL #3   Title Pt will be able to amb 1000 feet with normalized gait pattern with pain </= 2/10.    Time 6    Period Weeks    Status New    Target Date 06/03/20      PT LONG TERM GOAL #4   Title Pt will be able to climb a flight of stairs with pain </= 2/10 with single hand rail.    Time 6    Period Weeks    Status New    Target Date 06/03/20                 Plan - 04/26/20 0859    Clinical Impression Statement Pt arriving to therapy reporting chronic  Rknee pain. Pt arriving today reporting no pain and compliance with her HEP which has seemed to help. Pt tolerating all exercises well. Continue skilled PT.    Examination-Activity Limitations Stand;Other;Transfers;Lift;Squat;Stairs    Examination-Participation Restrictions Other;Community Activity    Rehab Potential Good    PT Frequency 2x / week    PT Duration 6 weeks    PT Treatment/Interventions Cryotherapy;Electrical Stimulation;Iontophoresis 4mg /ml Dexamethasone;Moist Heat;Ultrasound;Therapeutic activities;Functional mobility training;Stair training;Gait training;Therapeutic exercise;Balance training;Neuromuscular re-education;Patient/family education;Manual techniques;Passive range of motion;Dry needling;Taping    PT Next Visit Plan Further access Lumbar pain, R knee mobs, LE strengthening    PT Home Exercise Plan Access Code: [email or web address]    Consulted and Agree with Plan of Care Patient           Patient will benefit from  skilled therapeutic intervention in order to improve the following deficits and impairments:   Obesity,Pain,Decreased mobility,Decreased strength,Decreased range of motion,Difficulty walking,Decreased activity tolerance,Impaired flexibility  Visit Diagnosis: Chronic pain of right knee  Difficulty in walking, not elsewhere classified  Facial weakness     Problem List Patient Active Problem List   Diagnosis Date Noted  . Obesity 03/12/2016  . Palpitations 07/07/2013  . Acute sinusitis 04/21/2013  . Obstructive sleep apnea (adult) (pediatric) 08/25/2012  . Hypersomnia with sleep apnea, unspecified 08/25/2012  . Bilateral lower extremity edema 11/27/2011  . Obesity, Class III, BMI 40-49.9 (morbid obesity) ([place]) 10/26/2011  . Essential hypertension 11/28/2008  . IRRITABLE BOWEL SYNDROME - diarrhea predominant 11/10/2008    Oretha Caprice, PT, MPT 04/26/2020, 9:20 AM  [facility] Physical Therapy [address] [place], [place], [address] Phone: [phone]   Fax:  [phone]  Name: Patricia [name] MRN: [id number] Date of Birth: June 15, 1965

## 2020-04-29 ENCOUNTER — Other Ambulatory Visit: Payer: Self-pay

## 2020-04-29 ENCOUNTER — Ambulatory Visit: Payer: 59 | Admitting: Rehabilitative and Restorative Service Providers"

## 2020-04-29 ENCOUNTER — Encounter: Payer: Self-pay | Admitting: Rehabilitative and Restorative Service Providers"

## 2020-04-29 DIAGNOSIS — G8929 Other chronic pain: Secondary | ICD-10-CM | POA: Diagnosis not present

## 2020-04-29 DIAGNOSIS — R262 Difficulty in walking, not elsewhere classified: Secondary | ICD-10-CM | POA: Diagnosis not present

## 2020-04-29 DIAGNOSIS — M25561 Pain in right knee: Secondary | ICD-10-CM | POA: Diagnosis not present

## 2020-04-29 NOTE — Therapy (Signed)
[facility] Physical Therapy [address] [place], [place], [address] Phone: [phone]   Fax:  [phone]  Physical Therapy Treatment  Patient Details  Name: Patricia [name] MRN: [id number] Date of Birth: 01/31/1966 Referring Provider (PT): Jean Rosenthal MD   Encounter Date: 04/29/2020   PT End of Session - 04/29/20 0810    Visit Number 3    Number of Visits 13    Date for PT Re-Evaluation 06/03/20    Progress Note Due on Visit 10    PT Start Time 0758    PT Stop Time 0838    PT Time Calculation (min) 40 min    Activity Tolerance Patient tolerated treatment well    Behavior During Therapy [facility] for tasks assessed/performed           Past Medical History:  Diagnosis Date  . Arthritis    "spine-DDD-intermittent sciatic pain"  . Chronic cervicitis    resolved  . Diabetes mellitus without complication (HCC)    diet controlled  . GERD (gastroesophageal reflux disease)   . Heart murmur    "benign"-routinely sees yearly - Dr. Deretha Emory, cardiology  . History of kidney stones    multiple kidney stones-passed.  . Hypertension   . IBS (irritable bowel syndrome)    controlled with diet  . Menorrhagia    resolved s/p Hysterectomy  . Obesity   . Sleep apnea    cpap used nightly  . Vitamin D deficiency     Past Surgical History:  Procedure Laterality Date  . BREAST LUMPECTOMY Left    benign hamartoma  . CESAREAN SECTION     x1  . COLONOSCOPY  10/19/2005  . ENDOMETRIAL ABLATION    . ESOPHAGOGASTRODUODENOSCOPY  12/21/2008  . HYSTEROSCOPY WITH D & C     resection of endometrial polyp  . LAPAROSCOPIC GASTRIC SLEEVE RESECTION N/A 03/12/2016   Procedure: LAPAROSCOPIC GASTRIC SLEEVE RESECTION WITH UPPER ENDO;  Surgeon: Arta Bruce Kinsinger, MD;  Location: [facility];  Service: General;  Laterality: N/A;  . TUBAL LIGATION    . Uterine Polyps Removed    . VAGINAL HYSTERECTOMY     fibroids    There were no vitals filed for this visit.    Subjective Assessment - 04/29/20 0806    Subjective Pt. indicated no pain to report since last visit in knee.  Pt. stated some after visit leg soreness but short term. Pt. indicated not walking yet due to weather.    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 No/denies    Pain Onset More than a month ago                             [facility] Adult PT Treatment/Exercise - 04/29/20 0001      Neuro Re-ed    Neuro Re-ed Details  heel/toe raises alt 2 x 10, lateral stepping 3 cones x 10 bilateral, tandem ambulation fwd on foam 8 ft x 8 in // bars, tandem stance on foam 1 min x 2 bilateral      Knee/Hip Exercises: Aerobic   Recumbent Bike Lvl 2 10 mins      Knee/Hip Exercises: Seated   Sit to Sand 2 sets;10 reps;without UE support   18 inch chair  PT Short Term Goals - 04/26/20 0901      PT SHORT TERM GOAL #1   Title Pt will be independent with her HEP.    Status On-going    Target Date 05/13/20      PT SHORT TERM GOAL #2   Title Pt will improve her sit to stand to </= 14 seconds with no UE support.    Status On-going    Target Date 05/13/20             PT Long Term Goals - 04/19/20 1709      PT LONG TERM GOAL #1   Title Pt will improve her FOTO score from 62% function to >/= 68% function.    Time 6    Period Weeks    Status New    Target Date 06/03/20      PT LONG TERM GOAL #2   Title Pt will be able to improve her R LE strength to >/= 4+/5 to improve functional mobility.    Time 6    Period Weeks    Status New    Target Date 06/03/20      PT LONG TERM GOAL #3   Title Pt will be able to amb 1000 feet with normalized gait pattern with pain </= 2/10.    Time 6    Period Weeks    Status New    Target Date 06/03/20      PT LONG TERM  GOAL #4   Title Pt will be able to climb a flight of stairs with pain </= 2/10 with single hand rail.    Time 6    Period Weeks    Status New    Target Date 06/03/20                 Plan - 04/29/20 0827    Clinical Impression Statement Symptom presentation good at this time and will reassess after ability to perform some walking when weather improves.  Static and dynamic balance performed fair today and will benefit from continued improvement.    Examination-Activity Limitations Stand;Other;Transfers;Lift;Squat;Stairs    Examination-Participation Restrictions Other;Community Activity    Rehab Potential Good    PT Frequency 2x / week    PT Duration 6 weeks    PT Treatment/Interventions Cryotherapy;Electrical Stimulation;Iontophoresis 4mg /ml Dexamethasone;Moist Heat;Ultrasound;Therapeutic activities;Functional mobility training;Stair training;Gait training;Therapeutic exercise;Balance training;Neuromuscular re-education;Patient/family education;Manual techniques;Passive range of motion;Dry needling;Taping    PT Next Visit Plan Further access Lumbar pain, R knee mobs, LE strengthening, balance improvements    PT Home Exercise Plan Access Code: [email or web address]    Consulted and Agree with Plan of Care Patient           Patient will benefit from skilled therapeutic intervention in order to improve the following deficits and impairments:  Obesity,Pain,Decreased mobility,Decreased strength,Decreased range of motion,Difficulty walking,Decreased activity tolerance,Impaired flexibility  Visit Diagnosis: Chronic pain of right knee  Difficulty in walking, not elsewhere classified     Problem List Patient Active Problem List   Diagnosis Date Noted  . Obesity 03/12/2016  . Palpitations 07/07/2013  . Acute sinusitis 04/21/2013  . Obstructive sleep apnea (adult) (pediatric) 08/25/2012  . Hypersomnia with sleep apnea, unspecified 08/25/2012  . Bilateral lower extremity edema 11/27/2011  .  Obesity, Class III, BMI 40-49.9 (morbid obesity) ([place]) 10/26/2011  . Essential hypertension 11/28/2008  . IRRITABLE BOWEL SYNDROME - diarrhea predominant 11/10/2008    Scot Jun, PT, DPT, OCS, ATC 04/29/20  8:33 AM    [facility]  Physical Therapy [address] [place], [place], [address] Phone: [phone]   Fax:  [phone]  Name: Patricia [name] MRN: [id number] Date of Birth: [birth year]

## 2020-05-02 ENCOUNTER — Encounter: Payer: 59 | Admitting: Physical Therapy

## 2020-05-04 ENCOUNTER — Other Ambulatory Visit: Payer: Self-pay

## 2020-05-04 ENCOUNTER — Ambulatory Visit (INDEPENDENT_AMBULATORY_CARE_PROVIDER_SITE_OTHER): Payer: 59 | Admitting: Physical Therapy

## 2020-05-04 DIAGNOSIS — M25561 Pain in right knee: Secondary | ICD-10-CM | POA: Diagnosis not present

## 2020-05-04 DIAGNOSIS — G8929 Other chronic pain: Secondary | ICD-10-CM | POA: Diagnosis not present

## 2020-05-04 DIAGNOSIS — R262 Difficulty in walking, not elsewhere classified: Secondary | ICD-10-CM | POA: Diagnosis not present

## 2020-05-04 NOTE — Therapy (Signed)
[facility] Physical Therapy [address] [place], [place], [address] Phone: [phone]   Fax:  [phone]  Physical Therapy Treatment  Patient Details  Name: Patricia [name] MRN: [id number] Date of Birth: [birth year] Referring Provider (PT): Jean Rosenthal MD   Encounter Date: 05/04/2020   PT End of Session - 05/04/20 0847    Visit Number 4    Number of Visits 13    Date for PT Re-Evaluation 06/03/20    Progress Note Due on Visit 10    PT Start Time 0802    PT Stop Time 0842    PT Time Calculation (min) 40 min    Activity Tolerance Patient tolerated treatment well    Behavior During Therapy [facility] for tasks assessed/performed           Past Medical History:  Diagnosis Date  . Arthritis    "spine-DDD-intermittent sciatic pain"  . Chronic cervicitis    resolved  . Diabetes mellitus without complication (HCC)    diet controlled  . GERD (gastroesophageal reflux disease)   . Heart murmur    "benign"-routinely sees yearly - Dr. Deretha Emory, cardiology  . History of kidney stones    multiple kidney stones-passed.  . Hypertension   . IBS (irritable bowel syndrome)    controlled with diet  . Menorrhagia    resolved s/p Hysterectomy  . Obesity   . Sleep apnea    cpap used nightly  . Vitamin D deficiency     Past Surgical History:  Procedure Laterality Date  . BREAST LUMPECTOMY Left    benign hamartoma  . CESAREAN SECTION     x1  . COLONOSCOPY  10/19/2005  . ENDOMETRIAL ABLATION    . ESOPHAGOGASTRODUODENOSCOPY  12/21/2008  . HYSTEROSCOPY WITH D & C     resection of endometrial polyp  . LAPAROSCOPIC GASTRIC SLEEVE RESECTION N/A 03/12/2016   Procedure: LAPAROSCOPIC GASTRIC SLEEVE RESECTION WITH UPPER ENDO;  Surgeon: Arta Bruce Kinsinger, MD;  Location: [facility];  Service: General;  Laterality: N/A;  . TUBAL LIGATION    . Uterine Polyps Removed    . VAGINAL HYSTERECTOMY     fibroids    There were no vitals filed for this visit.    Subjective Assessment - 05/04/20 0842    Subjective Pt arriving reporting mild pain today upon arrival, but 10/10 pain on Saturday after standing for 20-30 minutes at a funeral.    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 Yes    Pain Score 2     Pain Location Knee    Pain Orientation Right    Pain Descriptors / Indicators Aching;Sore    Pain Type Chronic pain    Pain Onset More than a month ago                             [facility] Adult PT Treatment/Exercise - 05/04/20 0001      Knee/Hip Exercises: Aerobic   Recumbent Bike Lvl 2 8 mins      Knee/Hip Exercises: Machines for Strengthening   Cybex Leg Press 75# bilateral LE's, 2x 10 , R LE only: 50# 2x 10   working on eccentric control     Knee/Hip Exercises: Standing   Lateral Step Up 10 reps;Both    Forward Step Up 10  reps    Forward Step Up Limitations each LE leading    SLS R LE: x 3 holding 20 seconds using intermittent UE support    Other Standing Knee Exercises Airex beam x 4 laps,    Other Standing Knee Exercises tandum stance: x 4 holding 20 seconds each with intermittent UE support.      Knee/Hip Exercises: Seated   Sit to Sand 2 sets;10 reps;without UE support   18 inch chair     Knee/Hip Exercises: Supine   Bridges Strengthening;10 reps                    PT Short Term Goals - 05/04/20 0848      PT SHORT TERM GOAL #1   Title Pt will be independent with her HEP.    Period Weeks    Status On-going      PT SHORT TERM GOAL #2   Title Pt will improve her sit to stand to </= 14 seconds with no UE support.    Status On-going             PT Long Term Goals - 05/04/20 0848      PT LONG TERM GOAL #1   Title Pt will improve her FOTO score from 62% function to >/= 68% function.    Status On-going      PT LONG TERM GOAL  #2   Title Pt will be able to improve her R LE strength to >/= 4+/5 to improve functional mobility.    Status On-going      PT LONG TERM GOAL #3   Title Pt will be able to amb 1000 feet with normalized gait pattern with pain </= 2/10.    Status On-going      PT LONG TERM GOAL #4   Title Pt will be able to climb a flight of stairs with pain </= 2/10 with single hand rail.    Status On-going                 Plan - 05/04/20 0851    Clinical Impression Statement Taesha arriving to therapy reporting increased pain over the weekend after standing prolonged. Pt reporting pain of 10/10 in her R knee. Pt arriving today with 2-3/10 pain. Pt tolerating all exercises well progressing with LE strengthening and balance. Continue skilled PT.    Examination-Activity Limitations Stand;Other;Transfers;Lift;Squat;Stairs    Examination-Participation Restrictions Other;Community Activity    Rehab Potential Good    PT Frequency 2x / week    PT Duration 6 weeks    PT Treatment/Interventions Cryotherapy;Electrical Stimulation;Iontophoresis 4mg /ml Dexamethasone;Moist Heat;Ultrasound;Therapeutic activities;Functional mobility training;Stair training;Gait training;Therapeutic exercise;Balance training;Neuromuscular re-education;Patient/family education;Manual techniques;Passive range of motion;Dry needling;Taping    PT Next Visit Plan Further access Lumbar pain, R knee mobs, LE strengthening, balance improvements    PT Home Exercise Plan Access Code: [email or web address]    Consulted and Agree with Plan of Care Patient           Patient will benefit from skilled therapeutic intervention in order to improve the following deficits and impairments:  Obesity,Pain,Decreased mobility,Decreased strength,Decreased range of motion,Difficulty walking,Decreased activity tolerance,Impaired flexibility  Visit Diagnosis: Chronic pain of right knee  Difficulty in walking, not elsewhere classified     Problem List Patient  Active Problem List   Diagnosis Date Noted  . Obesity 03/12/2016  . Palpitations 07/07/2013  . Acute sinusitis 04/21/2013  . Obstructive sleep apnea (adult) (pediatric) 08/25/2012  . Hypersomnia with sleep apnea, unspecified 08/25/2012  .  Bilateral lower extremity edema 11/27/2011  . Obesity, Class III, BMI 40-49.9 (morbid obesity) ([place]) 10/26/2011  . Essential hypertension 11/28/2008  . IRRITABLE BOWEL SYNDROME - diarrhea predominant 11/10/2008    Patricia [name], PT, MPT 05/04/2020, 9:03 AM  [facility] Physical Therapy [address] [place], [place], [address] Phone: [phone]   Fax:  [phone]  Name: Patricia [name] MRN: [id number] Date of Birth: [birth year]

## 2020-05-09 ENCOUNTER — Telehealth: Payer: Self-pay | Admitting: Physical Therapy

## 2020-05-09 ENCOUNTER — Encounter: Payer: 59 | Admitting: Physical Therapy

## 2020-05-09 NOTE — Telephone Encounter (Signed)
I spoke to pt about her missed Physical Therapy appointment this morning at 8:00 am. Pt reported she had a family emergency and just forgot to call. Pt was reminded of her appointment on 05/11/2020 at 8:00 am.   Kearney Hard, PT, MPT 05/09/20 8:31 AM

## 2020-05-11 ENCOUNTER — Other Ambulatory Visit: Payer: Self-pay

## 2020-05-11 ENCOUNTER — Ambulatory Visit: Payer: 59 | Admitting: Physical Therapy

## 2020-05-11 ENCOUNTER — Encounter: Payer: Self-pay | Admitting: Physical Therapy

## 2020-05-11 DIAGNOSIS — M25561 Pain in right knee: Secondary | ICD-10-CM | POA: Diagnosis not present

## 2020-05-11 DIAGNOSIS — G8929 Other chronic pain: Secondary | ICD-10-CM

## 2020-05-11 DIAGNOSIS — R29810 Facial weakness: Secondary | ICD-10-CM

## 2020-05-11 DIAGNOSIS — R262 Difficulty in walking, not elsewhere classified: Secondary | ICD-10-CM

## 2020-05-11 NOTE — Therapy (Signed)
[facility] Physical Therapy [address] [place], [place], [address] Phone: [phone]   Fax:  [phone]  Physical Therapy Treatment  Patient Details  Name: Patricia [name] MRN: [id number] Date of Birth: Sep 22, 1965 Referring Provider (PT): Jean Rosenthal MD   Encounter Date: 05/11/2020   PT End of Session - 05/11/20 0807    Visit Number 5    Number of Visits 13    Date for PT Re-Evaluation 06/03/20    Progress Note Due on Visit 10    PT Start Time 0800    PT Stop Time 0845    PT Time Calculation (min) 45 min    Activity Tolerance Patient tolerated treatment well    Behavior During Therapy [facility] for tasks assessed/performed           Past Medical History:  Diagnosis Date  . Arthritis    "spine-DDD-intermittent sciatic pain"  . Chronic cervicitis    resolved  . Diabetes mellitus without complication (HCC)    diet controlled  . GERD (gastroesophageal reflux disease)   . Heart murmur    "benign"-routinely sees yearly - Dr. Deretha Emory, cardiology  . History of kidney stones    multiple kidney stones-passed.  . Hypertension   . IBS (irritable bowel syndrome)    controlled with diet  . Menorrhagia    resolved s/p Hysterectomy  . Obesity   . Sleep apnea    cpap used nightly  . Vitamin D deficiency     Past Surgical History:  Procedure Laterality Date  . BREAST LUMPECTOMY Left    benign hamartoma  . CESAREAN SECTION     x1  . COLONOSCOPY  10/19/2005  . ENDOMETRIAL ABLATION    . ESOPHAGOGASTRODUODENOSCOPY  12/21/2008  . HYSTEROSCOPY WITH D & C     resection of endometrial polyp  . LAPAROSCOPIC GASTRIC SLEEVE RESECTION N/A 03/12/2016   Procedure: LAPAROSCOPIC GASTRIC SLEEVE RESECTION WITH UPPER ENDO;  Surgeon: Arta Bruce Kinsinger, MD;  Location: [facility];  Service: General;  Laterality: N/A;  . TUBAL LIGATION    . Uterine Polyps Removed    . VAGINAL HYSTERECTOMY     fibroids    There were no vitals filed for this visit.    Subjective Assessment - 05/11/20 0804    Subjective Pt reporting no pain upon arrival. Pt did say that Monday night pt's knee was hurting aorund 4/10.    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 No/denies              [facility] PT Assessment - 05/11/20 0001      Assessment   Medical Diagnosis M25.56 R knee pain    Referring Provider (PT) Jean Rosenthal MD    Hand Dominance Right      AROM   Right Knee Extension 0    Right Knee Flexion 120    Left Knee Extension 0    Left Knee Flexion 115      Strength   Right Knee Flexion 4/5    Right Knee Extension 4/5    Left Knee Flexion 5/5    Left Knee Extension 5/5                         [facility] Adult PT Treatment/Exercise - 05/11/20 0001  Knee/Hip Exercises: Aerobic   Recumbent Bike L3 x 8 minutes      Knee/Hip Exercises: Machines for Strengthening   Cybex Leg Press 81# bilateral LE's, 50# R LE only each x 20 reps      Knee/Hip Exercises: Standing   Heel Raises 20 reps    Heel Raises Limitations with toe lifts no UE support    Lateral Step Up Both;15 reps    Forward Step Up Both;20 reps;Hand Hold: 0    Forward Step Up Limitations alternating    Rocker Board 2 minutes    Rocker Board Limitations round board 1 minute circles each direction    SLS R LE: reaching toward 3 cones with L LE.    Other Standing Knee Exercises tapping 8 inch step with heels x 1 i minute alternating.      Knee/Hip Exercises: Seated   Sit to Sand 2 sets;10 reps;without UE support   18 inch chair                 PT Education - 05/11/20 0807    Education Details exercise techniques    Person(s) Educated Patient    Methods Explanation;Demonstration    Comprehension Verbalized understanding;Returned demonstration            PT  Short Term Goals - 05/11/20 0810      PT SHORT TERM GOAL #1   Title Pt will be independent with her HEP.    Status Achieved      PT SHORT TERM GOAL #2   Title Pt will improve her sit to stand to </= 14 seconds with no UE support.    Baseline 12 seconds no UE support    Period Weeks    Status Achieved             PT Long Term Goals - 05/11/20 0816      PT LONG TERM GOAL #1   Title Pt will improve her FOTO score from 62% function to >/= 68% function.    Status On-going      PT LONG TERM GOAL #2   Title Pt will be able to improve her R LE strength to >/= 4+/5 to improve functional mobility.    Status On-going      PT LONG TERM GOAL #3   Title Pt will be able to amb 1000 feet with normalized gait pattern with pain </= 2/10.    Status Achieved      PT LONG TERM GOAL #4   Title Pt will be able to climb a flight of stairs with pain </= 2/10 with single hand rail.    Status On-going                 Plan - 05/11/20 0737    Clinical Impression Statement Pt arriving today reporting no pain but still reporting intermittent pain 4/10 some days. Pt progressing with quad strengthening and more dynamic movmenet patterns. Continue skillled PT.    Examination-Activity Limitations Stand;Other;Transfers;Lift;Squat;Stairs    Examination-Participation Restrictions Other;Community Activity    Rehab Potential Good    PT Frequency 2x / week    PT Duration 6 weeks    PT Treatment/Interventions Cryotherapy;Electrical Stimulation;Iontophoresis 4mg /ml Dexamethasone;Moist Heat;Ultrasound;Therapeutic activities;Functional mobility training;Stair training;Gait training;Therapeutic exercise;Balance training;Neuromuscular re-education;Patient/family education;Manual techniques;Passive range of motion;Dry needling;Taping    PT Next Visit Plan Further access Lumbar pain, R knee mobs, LE strengthening, balance improvements    PT Home Exercise Plan Access Code: [email or web address]    Consulted and Agree  with  Plan of Care Patient           Patient will benefit from skilled therapeutic intervention in order to improve the following deficits and impairments:  Obesity,Pain,Decreased mobility,Decreased strength,Decreased range of motion,Difficulty walking,Decreased activity tolerance,Impaired flexibility  Visit Diagnosis: Chronic pain of right knee  Difficulty in walking, not elsewhere classified  Facial weakness     Problem List Patient Active Problem List   Diagnosis Date Noted  . Obesity 03/12/2016  . Palpitations 07/07/2013  . Acute sinusitis 04/21/2013  . Obstructive sleep apnea (adult) (pediatric) 08/25/2012  . Hypersomnia with sleep apnea, unspecified 08/25/2012  . Bilateral lower extremity edema 11/27/2011  . Obesity, Class III, BMI 40-49.9 (morbid obesity) ([place]) 10/26/2011  . Essential hypertension 11/28/2008  . IRRITABLE BOWEL SYNDROME - diarrhea predominant 11/10/2008    Oretha Caprice, PT, MPT 05/11/2020, 8:44 AM  [facility] Physical Therapy [address] [place], [place], [address] Phone: [phone]   Fax:  [phone]  Name: Patricia [name] MRN: [id number] Date of Birth: November 30, 1965

## 2020-05-16 ENCOUNTER — Encounter: Payer: 59 | Admitting: Physical Therapy

## 2020-05-16 ENCOUNTER — Telehealth: Payer: Self-pay | Admitting: Physical Therapy

## 2020-05-16 NOTE — Telephone Encounter (Signed)
Pt did not show for her 8:00 am physical therapy appointment today. I called to follow up with pt. I left a message with a reminder of her upcoming appointment on Wednesday 05/18/2020 at 8:45am.   Kearney Hard, PT 05/16/20 8:32 AM

## 2020-05-18 ENCOUNTER — Encounter: Payer: 59 | Admitting: Physical Therapy

## 2020-05-20 ENCOUNTER — Other Ambulatory Visit: Payer: Self-pay | Admitting: Family Medicine

## 2020-05-23 ENCOUNTER — Encounter: Payer: 59 | Admitting: Physical Therapy

## 2020-05-23 ENCOUNTER — Encounter: Payer: Self-pay | Admitting: Family Medicine

## 2020-05-23 ENCOUNTER — Telehealth: Payer: Self-pay | Admitting: Family Medicine

## 2020-05-23 ENCOUNTER — Telehealth: Payer: Self-pay

## 2020-05-23 ENCOUNTER — Telehealth: Payer: Self-pay | Admitting: Physical Therapy

## 2020-05-23 NOTE — Telephone Encounter (Signed)
Spoke with Varney Biles regarding fmla forms she will need filled out for herself due to taking care of husband who is doing hospice care in their home

## 2020-05-23 NOTE — Telephone Encounter (Signed)
Pt brought in FMLA paperwork to filled out for spouse and herself. Placed paperwork in the nurse's folder. Please call when ready for pick up.  Cb#: [phone]

## 2020-05-23 NOTE — Telephone Encounter (Signed)
I spoke to Patricia [name] after she missed her 8am physical therapy appointment today to follow up on how things were going. Pt reported her husband was placed on hospice care and she needed to suspend her therapy visits for a while. Physical Therapy will be placed on hold until pt feels she is able to return.   Kearney Hard, PT, MPT 05/23/20 8:22 AM

## 2020-05-23 NOTE — Telephone Encounter (Signed)
Awaiting forms

## 2020-05-24 NOTE — Telephone Encounter (Signed)
Received FMLA forms.  Reason FMLA requested: care of spouse during terminal illness  Also received FMLA for spouse.   Forms routed to provider.

## 2020-05-25 ENCOUNTER — Encounter: Payer: 59 | Admitting: Physical Therapy

## 2020-06-01 NOTE — Telephone Encounter (Signed)
Received following message via MyChart on 05/24/2020: No need to complete the FMLA forms.  Derrick passed away this morning.  Please keep our family in your thoughts.

## 2020-06-02 ENCOUNTER — Encounter: Payer: Self-pay | Admitting: Family Medicine

## 2020-06-15 ENCOUNTER — Encounter: Payer: Self-pay | Admitting: Family Medicine

## 2020-07-06 ENCOUNTER — Ambulatory Visit (INDEPENDENT_AMBULATORY_CARE_PROVIDER_SITE_OTHER): Payer: 59 | Admitting: Orthopaedic Surgery

## 2020-07-06 ENCOUNTER — Encounter: Payer: Self-pay | Admitting: Orthopaedic Surgery

## 2020-07-06 DIAGNOSIS — G8929 Other chronic pain: Secondary | ICD-10-CM

## 2020-07-06 DIAGNOSIS — M25561 Pain in right knee: Secondary | ICD-10-CM

## 2020-07-06 NOTE — Progress Notes (Signed)
HPI: Patricia [name] returns today for just a referral back to physical therapy.  She states therapy was helping with her knee pain however her's husband became sick was diagnosed with pancreatic cancer in passed away in all of his [place] 1 month.  She wanted to get back to physical therapy but was told she needed to have an office visit for referral back to therapy. Get an MRI of her right knee showed some mild thinning of the articular cartilage weightbearing portion of the medial compartment but no full-thickness cartilage loss.  There was also evidence of the patella tracking laterally.    Physical exam: Right knee hyperextends tenderness along medial joint line.  No gross instability.   Plan: We will send her back to physical therapy to work on VMO strengthening and include modalities.  She is encouraged to get a home exercise program from therapy.  We will see her back in 3 months to see how she is doing.  No charge for today's office visit.

## 2020-07-07 ENCOUNTER — Other Ambulatory Visit: Payer: Self-pay

## 2020-07-07 ENCOUNTER — Encounter: Payer: Self-pay | Admitting: Family Medicine

## 2020-07-07 ENCOUNTER — Ambulatory Visit: Payer: 59 | Admitting: Family Medicine

## 2020-07-07 VITALS — BP 122/74 | HR 60 | Temp 98.2°F | Resp 14 | Ht 61.0 in | Wt 253.0 lb

## 2020-07-07 DIAGNOSIS — R7303 Prediabetes: Secondary | ICD-10-CM | POA: Diagnosis not present

## 2020-07-07 DIAGNOSIS — I10 Essential (primary) hypertension: Secondary | ICD-10-CM | POA: Diagnosis not present

## 2020-07-07 DIAGNOSIS — D3501 Benign neoplasm of right adrenal gland: Secondary | ICD-10-CM | POA: Diagnosis not present

## 2020-07-07 MED ORDER — TRAZODONE HCL 50 MG PO TABS: 50.0000 mg | ORAL_TABLET | Freq: Every evening | ORAL | 3 refills | Status: DC | PRN

## 2020-07-07 NOTE — Progress Notes (Signed)
Subjective:    Patient ID: Patricia [name], female    DOB: May 05, 1965, 55 y.o.   MRN: [id number]  HPI Patient is a very pleasant 55 year old African-American female who is here today for a checkup.  Since I last saw the patient, she lost her husband to an aggressive pancreatic cancer.  Since his passing, she has had trouble sleeping.  She states that she can go to sleep easily but she has a hard time staying asleep.  She will often wake up in 2 or 3 hours throughout the night.  She also has a history of an adrenal adenoma.  It was 2.2 cm in 2013.  It was 3.1 cm last year.  She is very concerned by this given the recent death of her husband which I can certainly understand.  In 2002 it was less than 10 Hounsfield units.  Therefore I believe it is a benign adenoma but she would like to repeat a CAT scan to monitor this particular given the fact that she still gets some mild burning right upper quadrant pain.  Otherwise she is doing well.  She denies any chest pain shortness of breath or dyspnea on exertion.  Blood pressure today is well controlled at 122/74 Past Medical History:  Diagnosis Date  . Arthritis    "spine-DDD-intermittent sciatic pain"  . Chronic cervicitis    resolved  . Diabetes mellitus without complication (HCC)    diet controlled  . GERD (gastroesophageal reflux disease)   . Heart murmur    "benign"-routinely sees yearly - Dr. Deretha Emory, cardiology  . History of kidney stones    multiple kidney stones-passed.  . Hypertension   . IBS (irritable bowel syndrome)    controlled with diet  . Menorrhagia    resolved s/p Hysterectomy  . Obesity   . Sleep apnea    cpap used nightly  . Vitamin D deficiency    Past Surgical History:  Procedure Laterality Date  . BREAST LUMPECTOMY Left    benign hamartoma  . CESAREAN SECTION     x1  . COLONOSCOPY  10/19/2005  . ENDOMETRIAL ABLATION    . ESOPHAGOGASTRODUODENOSCOPY  12/21/2008  . HYSTEROSCOPY WITH D & C     resection of  endometrial polyp  . LAPAROSCOPIC GASTRIC SLEEVE RESECTION N/A 03/12/2016   Procedure: LAPAROSCOPIC GASTRIC SLEEVE RESECTION WITH UPPER ENDO;  Surgeon: Arta Bruce Kinsinger, MD;  Location: [facility];  Service: General;  Laterality: N/A;  . TUBAL LIGATION    . Uterine Polyps Removed    . VAGINAL HYSTERECTOMY     fibroids   Current Outpatient Medications on File Prior to Visit  Medication Sig Dispense Refill  . atorvastatin (LIPITOR) 10 MG tablet Take 1 tablet (10 mg total) by mouth daily. 90 tablet 3  . fluticasone (FLONASE) 50 MCG/ACT nasal spray Place 2 sprays into both nostrils daily. 16 g 6  . Hyoscyamine Sulfate 0.375 MG TBCR Take 1 tablet (0.375 mg total) by mouth 2 (two) times daily. 60 tablet 6  . KLOR-CON M20 20 MEQ tablet TAKE 1 TABLET BY MOUTH EVERY DAY 30 tablet 8  . losartan-hydrochlorothiazide (HYZAAR) 100-12.5 MG tablet TAKE 1 TABLET BY MOUTH EVERY DAY 30 tablet 11  . Multiple Vitamin (MULTIVITAMIN) capsule Take 1 capsule by mouth daily.    Marland Kitchen omeprazole (PRILOSEC) 40 MG capsule TAKE 1 CAPSULE BY MOUTH EVERY DAY 30 capsule 11  . peg 3350 powder (MOVIPREP) 100 g SOLR Take 1 kit (200 g total) by mouth as  directed. 1 kit 0  . Probiotic Product (PROBIOTIC DAILY PO) Take 1 tablet by mouth daily.     No current facility-administered medications on file prior to visit.   No Known Allergies Social History   Socioeconomic History  . Marital status: Widowed    Spouse name: Montine Circle  . Number of children: 2  . Years of education: 82  . Highest education level: Not on file  Occupational History  . Occupation: QUALITY ANALYST    Employer: [employer]  Tobacco Use  . Smoking status: Never Smoker  . Smokeless tobacco: Never Used  Vaping Use  . Vaping Use: Never used  Substance and Sexual Activity  . Alcohol use: Yes    Alcohol/week: 0.0 standard drinks    Comment: socially  . Drug use: No  . Sexual activity: Yes    Birth control/protection: Surgical  Other Topics Concern   . Not on file  Social History Narrative   Married, college education, 2 children    Teacher, early years/pre for [employer]    patient is right handed and consumes green tea daily   Never smoker no tobacco, no drug use, only rare to occasional alcohol   Social Determinants of Radio broadcast assistant Strain: Not on file  Food Insecurity: Not on file  Transportation Needs: Not on file  Physical Activity: Not on file  Stress: Not on file  Social Connections: Not on file  Intimate Partner Violence: Not on file   Family History  Problem Relation Age of Onset  . Hypertension Father   . Hypertension Mother   . Diabetes Mother   . Coronary artery disease Mother   . Sudden death Mother   . Cancer Brother        carcinoid tumer of the appendix  . Colon cancer Neg Hx   . Esophageal cancer Neg Hx   . Stomach cancer Neg Hx   . Rectal cancer Neg Hx       Review of Systems  All other systems reviewed and are negative.      Objective:   Physical Exam Vitals reviewed.  Constitutional:      General: She is not in acute distress.    Appearance: She is well-developed. She is not diaphoretic.  HENT:     Head: Normocephalic and atraumatic.     Right Ear: External ear normal.     Left Ear: External ear normal.     Nose: Nose normal.     Mouth/Throat:     Pharynx: No oropharyngeal exudate.  Eyes:     General: No scleral icterus.       Right eye: No discharge.        Left eye: No discharge.     Conjunctiva/sclera: Conjunctivae normal.     Pupils: Pupils are equal, round, and reactive to light.  Neck:     Thyroid: No thyromegaly.     Vascular: No JVD.     Trachea: No tracheal deviation.  Cardiovascular:     Rate and Rhythm: Normal rate and regular rhythm.     Heart sounds: Normal heart sounds. No murmur heard. No friction rub. No gallop.   Pulmonary:     Effort: Pulmonary effort is normal. No respiratory distress.     Breath sounds: Normal breath sounds. No stridor. No  wheezing or rales.  Chest:     Chest wall: No tenderness.  Abdominal:     General: Bowel sounds are normal. There is no distension.  Palpations: Abdomen is soft. There is no mass.     Tenderness: There is no abdominal tenderness. There is no guarding or rebound.     Hernia: No hernia is present.  Musculoskeletal:     Cervical back: Normal range of motion and neck supple.  Lymphadenopathy:     Cervical: No cervical adenopathy.  Skin:    General: Skin is warm.     Coloration: Skin is not pale.     Findings: No erythema or rash.  Neurological:     Mental Status: She is alert and oriented to person, place, and time.     Cranial Nerves: No cranial nerve deficit.     Sensory: No sensory deficit.     Motor: No abnormal muscle tone.     Coordination: Coordination normal.     Deep Tendon Reflexes: Reflexes normal.  Psychiatric:        Behavior: Behavior normal.        Thought Content: Thought content normal.           Assessment & Plan:  Benign essential HTN - Plan: CBC with Differential/Platelet, COMPLETE METABOLIC PANEL WITH GFR, Lipid panel, Hemoglobin A1c  Obesity, Class III, BMI 40-49.9 (morbid obesity) ([place]) - Plan: CBC with Differential/Platelet, COMPLETE METABOLIC PANEL WITH GFR, Lipid panel, Hemoglobin A1c  Adrenal adenoma, right - Plan: CT Abdomen Pelvis W Contrast  Prediabetes - Plan: CBC with Differential/Platelet, COMPLETE METABOLIC PANEL WITH GFR, Lipid panel, Hemoglobin A1c  I will schedule the patient for a CT scan to monitor the adrenal adenoma.  Given her history of prediabetes I will check an A1c.  Her blood pressure today is excellent.  Check a CMP and a fasting lipid panel.  Goal LDL cholesterol is less than 130.  Ideally I like her A1c to be well below 6.  Try trazodone 50 mg p.o. nightly for insomnia.

## 2020-07-08 LAB — CBC WITH DIFFERENTIAL/PLATELET
Absolute Monocytes: 387 cells/uL (ref 200–950)
Basophils Absolute: 32 cells/uL (ref 0–200)
Basophils Relative: 0.7 %
Eosinophils Absolute: 117 cells/uL (ref 15–500)
Eosinophils Relative: 2.6 %
HCT: 41.2 % (ref 35.0–45.0)
Hemoglobin: 13.6 g/dL (ref 11.7–15.5)
Lymphs Abs: 1625 cells/uL (ref 850–3900)
MCH: 28.9 pg (ref 27.0–33.0)
MCHC: 33 g/dL (ref 32.0–36.0)
MCV: 87.5 fL (ref 80.0–100.0)
MPV: 10.4 fL (ref 7.5–12.5)
Monocytes Relative: 8.6 %
Neutro Abs: 2340 cells/uL (ref 1500–7800)
Neutrophils Relative %: 52 %
Platelets: 229 10*3/uL (ref 140–400)
RBC: 4.71 10*6/uL (ref 3.80–5.10)
RDW: 13.1 % (ref 11.0–15.0)
Total Lymphocyte: 36.1 %
WBC: 4.5 10*3/uL (ref 3.8–10.8)

## 2020-07-08 LAB — COMPLETE METABOLIC PANEL WITH GFR
AG Ratio: 1.7 (calc) (ref 1.0–2.5)
ALT: 12 U/L (ref 6–29)
AST: 16 U/L (ref 10–35)
Albumin: 4.2 g/dL (ref 3.6–5.1)
Alkaline phosphatase (APISO): 76 U/L (ref 37–153)
BUN: 15 mg/dL (ref 7–25)
CO2: 28 mmol/L (ref 20–32)
Calcium: 9.4 mg/dL (ref 8.6–10.4)
Chloride: 105 mmol/L (ref 98–110)
Creat: 0.72 mg/dL (ref 0.50–1.05)
GFR, Est African American: 111 mL/min/{1.73_m2} (ref >=60)
GFR, Est Non African American: 96 mL/min/{1.73_m2} (ref >=60)
Globulin: 2.5 g/dL (calc) (ref 1.9–3.7)
Glucose, Bld: 97 mg/dL (ref 65–99)
Potassium: 4.2 mmol/L (ref 3.5–5.3)
Sodium: 142 mmol/L (ref 135–146)
Total Bilirubin: 0.5 mg/dL (ref 0.2–1.2)
Total Protein: 6.7 g/dL (ref 6.1–8.1)

## 2020-07-08 LAB — LIPID PANEL
Cholesterol: 173 mg/dL (ref <200)
HDL: 72 mg/dL (ref >=50)
LDL Cholesterol (Calc): 81 mg/dL (calc)
Non-HDL Cholesterol (Calc): 101 mg/dL (calc) (ref <130)
Total CHOL/HDL Ratio: 2.4 (calc) (ref <5.0)
Triglycerides: 102 mg/dL (ref <150)

## 2020-07-08 LAB — HEMOGLOBIN A1C
Hgb A1c MFr Bld: 5.7 % of total Hgb — ABNORMAL HIGH (ref <5.7)
Mean Plasma Glucose: 117 mg/dL
eAG (mmol/L): 6.5 mmol/L

## 2020-07-14 ENCOUNTER — Telehealth: Payer: Self-pay | Admitting: Cardiology

## 2020-07-14 DIAGNOSIS — Z006 Encounter for examination for normal comparison and control in clinical research program: Secondary | ICD-10-CM

## 2020-07-14 NOTE — Telephone Encounter (Addendum)
Called patient for 90 day Identify phone call pt stated she is not having anymore cardiac symptoms but did follow up with PCP, I reminded the patient that we would be calling her back around December for a year follow up phone call.

## 2020-07-19 ENCOUNTER — Ambulatory Visit
Admission: RE | Admit: 2020-07-19 | Discharge: 2020-07-19 | Disposition: A | Discharge status: Home / Self Care | Payer: 59 | Source: Ambulatory Visit | Attending: Family Medicine | Admitting: Family Medicine

## 2020-07-19 ENCOUNTER — Other Ambulatory Visit: Payer: Self-pay

## 2020-07-19 DIAGNOSIS — D3501 Benign neoplasm of right adrenal gland: Secondary | ICD-10-CM

## 2020-07-19 MED ORDER — IOPAMIDOL (ISOVUE-300) INJECTION 61%
100.0000 mL | Freq: Once | INTRAVENOUS | Status: AC | PRN
  Administered 2020-07-19: 100 mL via INTRAVENOUS

## 2020-07-21 ENCOUNTER — Encounter: Payer: Self-pay | Admitting: Family Medicine

## 2020-07-27 ENCOUNTER — Ambulatory Visit (INDEPENDENT_AMBULATORY_CARE_PROVIDER_SITE_OTHER): Payer: 59 | Admitting: Physical Therapy

## 2020-07-27 ENCOUNTER — Other Ambulatory Visit: Payer: Self-pay

## 2020-07-27 ENCOUNTER — Encounter: Payer: Self-pay | Admitting: Physical Therapy

## 2020-07-27 DIAGNOSIS — M25561 Pain in right knee: Secondary | ICD-10-CM | POA: Diagnosis not present

## 2020-07-27 DIAGNOSIS — G8929 Other chronic pain: Secondary | ICD-10-CM | POA: Diagnosis not present

## 2020-07-27 DIAGNOSIS — M6281 Muscle weakness (generalized): Secondary | ICD-10-CM | POA: Diagnosis not present

## 2020-07-27 DIAGNOSIS — R262 Difficulty in walking, not elsewhere classified: Secondary | ICD-10-CM | POA: Diagnosis not present

## 2020-07-27 NOTE — Addendum Note (Signed)
Addended by: Kearney Hard R on: 07/27/2020 09:07 AM   Modules accepted: Orders

## 2020-07-27 NOTE — Therapy (Signed)
[facility] Physical Therapy [address] [place], [place], [address] Phone: [phone]   Fax:  [phone]  Physical Therapy Evaluation  Patient Details  Name: Patricia [name] MRN: [id number] Date of Birth: 11/11/1965 Referring Provider (PT): Jean Rosenthal MD   Encounter Date: 07/27/2020   PT End of Session - 07/27/20 0812    Visit Number 1    Number of Visits 1    Date for PT Re-Evaluation 07/28/20    Progress Note Due on Visit --    PT Start Time 0800    PT Stop Time 0840    PT Time Calculation (min) 40 min    Activity Tolerance Patient tolerated treatment well    Behavior During Therapy [facility] for tasks assessed/performed           Past Medical History:  Diagnosis Date  . Arthritis    "spine-DDD-intermittent sciatic pain"  . Chronic cervicitis    resolved  . Diabetes mellitus without complication (HCC)    diet controlled  . GERD (gastroesophageal reflux disease)   . Heart murmur    "benign"-routinely sees yearly - Dr. Deretha Emory, cardiology  . History of kidney stones    multiple kidney stones-passed.  . Hypertension   . IBS (irritable bowel syndrome)    controlled with diet  . Menorrhagia    resolved s/p Hysterectomy  . Obesity   . Sleep apnea    cpap used nightly  . Vitamin D deficiency     Past Surgical History:  Procedure Laterality Date  . BREAST LUMPECTOMY Left    benign hamartoma  . CESAREAN SECTION     x1  . COLONOSCOPY  10/19/2005  . ENDOMETRIAL ABLATION    . ESOPHAGOGASTRODUODENOSCOPY  12/21/2008  . HYSTEROSCOPY WITH D & C     resection of endometrial polyp  . LAPAROSCOPIC GASTRIC SLEEVE RESECTION N/A 03/12/2016   Procedure: LAPAROSCOPIC GASTRIC SLEEVE RESECTION WITH UPPER ENDO;  Surgeon: Arta Bruce Kinsinger, MD;  Location: [facility];  Service: General;  Laterality: N/A;  . TUBAL LIGATION    . Uterine Polyps Removed    . VAGINAL HYSTERECTOMY     fibroids    There were no vitals filed for this visit.     Subjective Assessment - 07/27/20 0809    Subjective Pt arriving for PT evaluation following almost 2 months off from therapy due to taking care of her husband who passed due to cancer. Pt reporting pain with certain movements and prolonged standing and walking.    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 pain varies with walking in the park a couple of miles    Diagnostic tests MRI    Patient Stated Goals Be able to walk and exercise without pain    Currently in Pain? No/denies              [facility] PT Assessment - 07/27/20 0001      Assessment   Medical Diagnosis M25.56 R knee pain    Referring Provider (PT) Jean Rosenthal MD    Hand Dominance Right    Prior Therapy yes earlier this year for right knee pain      Precautions   Precautions None      Restrictions   Weight Bearing Restrictions No      Balance Screen   Has the patient fallen in the past 6 months No    Is the patient  reluctant to leave their home because of a fear of falling?  No      Home Environment   Living Environment Private residence    Type of [place] Access Level entry    Home Layout Two level    Alternate Level Stairs-Number of Steps 15    Alternate Level Stairs-Rails Left      Prior Function   Level of Independence Independent    Vocation Full time employment    Vocation Requirements sitting    Leisure garden, travel, read      Cognition   Overall Cognitive Status Within Functional Limits for tasks assessed      Observation/Other Assessments   Focus on Therapeutic Outcomes (FOTO)  71 (predicted 70)      ROM / Strength   AROM / PROM / Strength PROM      AROM   Right Knee Extension 0    Right Knee Flexion 120    Left Knee Extension 0    Left Knee Flexion 118      PROM   PROM Assessment Site Knee    Right/Left Knee Right;Left    Right Knee Extension 0     Right Knee Flexion 122    Left Knee Extension 0    Left Knee Flexion 122      Strength   Strength Assessment Site Knee    Right/Left Knee Right;Left    Right Knee Flexion 5/5    Right Knee Extension 5/5    Left Knee Flexion 5/5    Left Knee Extension 5/5      Ambulation/Gait   Assistive device None    Gait Pattern Step-through pattern                      Objective measurements completed on examination: See above findings.       [facility] Adult PT Treatment/Exercise - 07/27/20 0001      Knee/Hip Exercises: Stretches   Active Hamstring Stretch Right;Left;2 reps;10 seconds      Knee/Hip Exercises: Standing   Other Standing Knee Exercises hip abduciton, hip extension with green theraband x 5 reps, mini squats/wall squats x 5 holding 3-5 seconds                  PT Education - 07/27/20 0811    Education Details PT POC, continue current HEP    Person(s) Educated Patient    Methods Explanation    Comprehension Verbalized understanding            PT Short Term Goals - 07/27/20 0813      PT SHORT TERM GOAL #1   Title Pt will be independent with her advanced HEP.    Baseline Able to demonstrate independence with upgraded program.    Time 3    Period Weeks    Status Achieved    Target Date 08/19/20      PT SHORT TERM GOAL #2   Title -    Baseline -             PT Long Term Goals - 07/27/20 0814      PT LONG TERM GOAL #1   Title --                  Plan - 07/27/20 4431    Clinical Impression Statement Pt arriving today after almost 2 month break in PT services due to personal reasons. Pt presenting today with grossly  5/5 strength in bilateral knees. Pt's AROM is 0-120 in her right knee and 0-118 in her left. Passive ROM is 0-122 in both. Pt reporting abiity to amb in a 5K last week with no pain. Pt did report occassional pain after walking in the park 2-3 miles. Pt was instructed in LE stretching prior to longer walks and her HEP was  updated. No skilled PT indicated at present time.    Examination-Activity Limitations Squat    Examination-Participation Restrictions Other;Community Activity    Stability/Clinical Decision Making Stable/Uncomplicated    Clinical Decision Making Low    Rehab Potential Excellent    PT Frequency One time visit    PT Treatment/Interventions Patient/family education;Therapeutic activities;Functional mobility training;Therapeutic exercise    PT Next Visit Plan Pt is discharged from PT with updated advanced HEP    PT Home Exercise Plan Access Code: [email or web address]    Consulted and Agree with Plan of Care Patient           Patient will benefit from skilled therapeutic intervention in order to improve the following deficits and impairments:  Difficulty walking  Visit Diagnosis: Chronic pain of right knee  Difficulty in walking, not elsewhere classified  Muscle weakness (generalized)     Problem List Patient Active Problem List   Diagnosis Date Noted  . Obesity 03/12/2016  . Palpitations 07/07/2013  . Acute sinusitis 04/21/2013  . Obstructive sleep apnea (adult) (pediatric) 08/25/2012  . Hypersomnia with sleep apnea, unspecified 08/25/2012  . Bilateral lower extremity edema 11/27/2011  . Obesity, Class III, BMI 40-49.9 (morbid obesity) ([place]) 10/26/2011  . Essential hypertension 11/28/2008  . IRRITABLE BOWEL SYNDROME - diarrhea predominant 11/10/2008    Oretha Caprice, PT, MPT 07/27/2020, 9:02 AM  [facility] Physical Therapy [address] [place], [place], [address] Phone: [phone]   Fax:  [phone]  Name: Patricia [name] MRN: [id number] Date of Birth: 1965-04-16

## 2020-08-19 ENCOUNTER — Other Ambulatory Visit: Payer: Self-pay | Admitting: *Deleted

## 2020-08-19 MED ORDER — LOSARTAN POTASSIUM-HCTZ 100-12.5 MG PO TABS
1.0000 | ORAL_TABLET | Freq: Every day | ORAL | 3 refills | Status: DC
Start: 2020-08-19 — End: 2021-08-23

## 2020-08-27 ENCOUNTER — Other Ambulatory Visit: Payer: Self-pay | Admitting: Family Medicine

## 2020-09-01 ENCOUNTER — Ambulatory Visit: Payer: 59 | Admitting: Nurse Practitioner

## 2020-09-22 ENCOUNTER — Ambulatory Visit: Payer: 59 | Admitting: Orthopaedic Surgery

## 2020-10-07 ENCOUNTER — Encounter (HOSPITAL_COMMUNITY): Payer: Self-pay | Admitting: *Deleted

## 2020-11-02 ENCOUNTER — Encounter: Payer: Self-pay | Admitting: Orthopaedic Surgery

## 2020-11-02 ENCOUNTER — Ambulatory Visit: Payer: 59 | Admitting: Orthopaedic Surgery

## 2020-11-02 DIAGNOSIS — S86012A Strain of left Achilles tendon, initial encounter: Secondary | ICD-10-CM | POA: Diagnosis not present

## 2020-11-02 NOTE — Progress Notes (Signed)
The patient is a long-term patient of ours.  She is only 55 years old.  We have been following her for knee issues.  Her husband passed away about 5 months ago.  She has been through physical therapy to strengthen her knee.  In July she went to stay with her son in [place] and was exercise walking when she stepped wrong on an uneven surface injuring her left ankle.  She went to urgent care center and they told her that there was no fracture they can see after seeing x-rays.  She said she still felt weakness and pain with swelling of the left ankle and came to [place] for further ration and treatment.  She has had no other acute change in medical status.  She is not a diabetic.  She does have a BMI of over 40.  On examination I had her lay in a prone position on the exam table.  Her right Achilles is easily visible in terms of the soft tissue and there is no swelling around the ankle.  The Wheeler test is negative on the left side.  Her right side does show swelling around the posterior aspect of her ankle and calf.  She is tender on the calf.  I feel a slight deficit in the Achilles tendon several centimeters proximal to the insertion.  She had only a slight hint of plantarflexion with her [place] Silos test on the right side.  I do feel that there is a significant tear of her Achilles tendon on the right side that is now subacute at about 6 weeks.  We will put her in a cam walking boot with a heel buildup today.  I would like to send her to Dr. Sharol Given for further ration treatment of this injury.  He may end up recommending conservative treatment with prolonged treatment with a heel buildup followed by eventual physical therapy versus a surgical intervention and she understands this as well.

## 2020-11-07 ENCOUNTER — Ambulatory Visit: Payer: Self-pay

## 2020-11-07 ENCOUNTER — Other Ambulatory Visit: Payer: Self-pay

## 2020-11-07 ENCOUNTER — Encounter: Payer: Self-pay | Admitting: Orthopedic Surgery

## 2020-11-07 ENCOUNTER — Ambulatory Visit: Payer: 59 | Admitting: Orthopedic Surgery

## 2020-11-07 DIAGNOSIS — S86012A Strain of left Achilles tendon, initial encounter: Secondary | ICD-10-CM | POA: Diagnosis not present

## 2020-11-07 NOTE — Progress Notes (Signed)
Office Visit Note   Patient: Patricia [name]           Date of Birth: [birth year]           MRN: [id number] Visit Date: 11/07/2020              Requested by: Susy Frizzle, MD [address] [place],   [id number] PCP: Susy Frizzle, MD  Chief Complaint  Patient presents with   Left Ankle - Pain      HPI: Patient is a 55 year old woman who was seen in referral from Dr. Ninfa Linden for left Achilles injury.  Patient states she was in [place] in July when she was walking stepped wrong and injured her Achilles.  She went to urgent care radiographs were obtained she is currently wearing a cam boot with 916 heel lift x2.  Patient states she is doing better with wearing the fracture boot.  Assessment & Plan: Visit Diagnoses:  1. Achilles tendon tear, left, initial encounter     Plan: We will have her continue the fracture boot and the heel lift reevaluate in 4 weeks.  I anticipate this should heal uneventfully without surgery.  Follow-Up Instructions: No follow-ups on file.   Ortho Exam  Patient is alert, oriented, no adenopathy, well-dressed, normal affect, normal respiratory effort. Examination patient has a good dorsalis pedis pulse there is no ecchymosis no skin breakdown.  Patient has a palpable partial tear of the Achilles radiographically there is shows an avulsion off the heel spur on the left.  With compression of the calf she does have plantarflexion of the foot consistent with an intact Achilles tendon.  By palpation approximately three quarters of her Achilles tendon appears intact.  Imaging: XR Ankle Complete Left  Result Date: 11/07/2020 2 view radiographs of the left ankle shows a congruent mortise with an avulsion bone chip along the Achilles.  XR Os Calcis Left  Result Date: 11/07/2020 2 view radiographs of the left calcaneus shows an avulsion bone chip proximal to the calcaneus.  No images are attached to the encounter.  Labs: Lab Results   Component Value Date   HGBA1C 5.7 (H) 07/07/2020   HGBA1C 5.7 (H) 06/30/2019   HGBA1C 5.8 (H) 11/11/2017   ESRSEDRATE 38 (H) 01/05/2020   ESRSEDRATE 61 Repeated and verified X2. (H) 08/31/2019   CRP 33.6 (H) 01/05/2020   CRP 3.7 08/31/2019   LABURIC 5.6 11/12/2011   LABORGA No Salmonella,Shigella,Campylobacter,Yersinia,or 06/10/2012   LABORGA No E.coli 0157:H7 isolated. 06/10/2012   LABORGA NO GROWTH 06/10/2012     Lab Results  Component Value Date   ALBUMIN 4.1 11/11/2017   ALBUMIN 4.0 03/13/2016   ALBUMIN 4.4 03/06/2016    No results found for: MG Lab Results  Component Value Date   VD25OH 40.0 11/11/2017    No results found for: PREALBUMIN CBC EXTENDED Latest Ref Rng & Units 07/07/2020 01/05/2020 08/31/2019  WBC 3.8 - 10.8 Thousand/uL 4.5 4.2 5.2  RBC 3.80 - 5.10 Million/uL 4.71 4.70 4.45  HGB 11.7 - 15.5 g/dL 13.6 13.7 13.2  HCT 35.0 - 45.0 % 41.2 41.4 38.9  PLT 140 - 400 Thousand/uL 229 245 247.0  NEUTROABS 1,500 - 7,800 cells/uL 2,340 2,155 2.9  LYMPHSABS 850 - 3,900 cells/uL 1,625 1,567 1.8     There is no height or weight on file to calculate BMI.  Orders:  Orders Placed This Encounter  Procedures   XR Os Calcis Left   XR Ankle Complete Left  No orders of the defined types were placed in this encounter.    Procedures: No procedures performed  Clinical Data: No additional findings.  ROS:  All other systems negative, except as noted in the HPI. Review of Systems  Objective: Vital Signs: There were no vitals taken for this visit.  Specialty Comments:  No specialty comments available.  PMFS History: Patient Active Problem List   Diagnosis Date Noted   Obesity 03/12/2016   Palpitations 07/07/2013   Acute sinusitis 04/21/2013   Obstructive sleep apnea (adult) (pediatric) 08/25/2012   Hypersomnia with sleep apnea, unspecified 08/25/2012   Bilateral lower extremity edema 11/27/2011   Obesity, Class III, BMI 40-49.9 (morbid obesity) ([place])  10/26/2011   Essential hypertension 11/28/2008   IRRITABLE BOWEL SYNDROME - diarrhea predominant 11/10/2008   Past Medical History:  Diagnosis Date   Arthritis    "spine-DDD-intermittent sciatic pain"   Chronic cervicitis    resolved   Diabetes mellitus without complication (HCC)    diet controlled   GERD (gastroesophageal reflux disease)    Heart murmur    "benign"-routinely sees yearly - Dr. Deretha Emory, cardiology   History of kidney stones    multiple kidney stones-passed.   Hypertension    IBS (irritable bowel syndrome)    controlled with diet   Menorrhagia    resolved s/p Hysterectomy   Obesity    Sleep apnea    cpap used nightly   Vitamin D deficiency     Family History  Problem Relation Age of Onset   Hypertension Father    Hypertension Mother    Diabetes Mother    Coronary artery disease Mother    Sudden death Mother    Cancer Brother        carcinoid tumer of the appendix   Colon cancer Neg Hx    Esophageal cancer Neg Hx    Stomach cancer Neg Hx    Rectal cancer Neg Hx     Past Surgical History:  Procedure Laterality Date   BREAST LUMPECTOMY Left    benign hamartoma   CESAREAN SECTION     x1   COLONOSCOPY  10/19/2005   ENDOMETRIAL ABLATION     ESOPHAGOGASTRODUODENOSCOPY  12/21/2008   HYSTEROSCOPY WITH D & C     resection of endometrial polyp   LAPAROSCOPIC GASTRIC SLEEVE RESECTION N/A 03/12/2016   Procedure: LAPAROSCOPIC GASTRIC SLEEVE RESECTION WITH UPPER ENDO;  Surgeon: Arta Bruce Kinsinger, MD;  Location: [facility];  Service: General;  Laterality: N/A;   TUBAL LIGATION     Uterine Polyps Removed     VAGINAL HYSTERECTOMY     fibroids   Social History   Occupational History   Occupation: QUALITY ANALYST    Employer: [employer]  Tobacco Use   Smoking status: Never   Smokeless tobacco: Never  Vaping Use   Vaping Use: Never used  Substance and Sexual Activity   Alcohol use: Yes    Alcohol/week: 0.0 standard drinks    Comment:  socially   Drug use: No   Sexual activity: Yes    Birth control/protection: Surgical

## 2020-12-06 ENCOUNTER — Ambulatory Visit: Payer: 59 | Admitting: Orthopedic Surgery

## 2020-12-06 ENCOUNTER — Encounter: Payer: Self-pay | Admitting: Orthopedic Surgery

## 2020-12-06 DIAGNOSIS — S86012S Strain of left Achilles tendon, sequela: Secondary | ICD-10-CM | POA: Diagnosis not present

## 2020-12-06 DIAGNOSIS — M25572 Pain in left ankle and joints of left foot: Secondary | ICD-10-CM | POA: Diagnosis not present

## 2020-12-06 NOTE — Progress Notes (Signed)
Office Visit Note   Patient: Patricia [name]           Date of Birth: 1965-03-28           MRN: [id number] Visit Date: 12/06/2020              Requested by: Susy Frizzle, MD [address] [place],   [address] PCP: Susy Frizzle, MD  Chief Complaint  Patient presents with   Left Achilles Tendon - Follow-up      HPI: Patient is a 55 year old woman who presents 2 months status post partial tear of her left Achilles tendon.  Patient is currently been ambulating in a fracture boot with 2 heel lifts.  She states she is doing well.  She states the ankle feels stiff.  She has been using an Ace wrap for compression.  Assessment & Plan: Visit Diagnoses:  1. Partial rupture of Achilles tendon, left, sequela     Plan: Patient will advance to regular sneakers she is given a 716 since heel lift we will set her up with physical therapy for strengthening of the Achilles.  She will advance her activities as tolerated.  Follow-Up Instructions: Return if symptoms worsen or fail to improve.   Ortho Exam  Patient is alert, oriented, no adenopathy, well-dressed, normal affect, normal respiratory effort. Examination patient has good range of motion the ankle there is no contracture.  There is no palpable defect of the Achilles compression of the calf reproduces plantarflexion of the foot.  We will have patient start with therapy with exercises discussed the importance of not pushing past any discomfort and advancing slowly with her strengthening.  Imaging: No results found. No images are attached to the encounter.  Labs: Lab Results  Component Value Date   HGBA1C 5.7 (H) 07/07/2020   HGBA1C 5.7 (H) 06/30/2019   HGBA1C 5.8 (H) 11/11/2017   ESRSEDRATE 38 (H) 01/05/2020   ESRSEDRATE 61 Repeated and verified X2. (H) 08/31/2019   CRP 33.6 (H) 01/05/2020   CRP 3.7 08/31/2019   LABURIC 5.6 11/12/2011   LABORGA No Salmonella,Shigella,Campylobacter,Yersinia,or 06/10/2012    LABORGA No E.coli 0157:H7 isolated. 06/10/2012   LABORGA NO GROWTH 06/10/2012     Lab Results  Component Value Date   ALBUMIN 4.1 11/11/2017   ALBUMIN 4.0 03/13/2016   ALBUMIN 4.4 03/06/2016    No results found for: MG Lab Results  Component Value Date   VD25OH 40.0 11/11/2017    No results found for: PREALBUMIN CBC EXTENDED Latest Ref Rng & Units 07/07/2020 01/05/2020 08/31/2019  WBC 3.8 - 10.8 Thousand/uL 4.5 4.2 5.2  RBC 3.80 - 5.10 Million/uL 4.71 4.70 4.45  HGB 11.7 - 15.5 g/dL 13.6 13.7 13.2  HCT 35.0 - 45.0 % 41.2 41.4 38.9  PLT 140 - 400 Thousand/uL 229 245 247.0  NEUTROABS 1,500 - 7,800 cells/uL 2,340 2,155 2.9  LYMPHSABS 850 - 3,900 cells/uL 1,625 1,567 1.8     There is no height or weight on file to calculate BMI.  Orders:  Orders Placed This Encounter  Procedures   Ambulatory referral to Physical Therapy   No orders of the defined types were placed in this encounter.    Procedures: No procedures performed  Clinical Data: No additional findings.  ROS:  All other systems negative, except as noted in the HPI. Review of Systems  Objective: Vital Signs: There were no vitals taken for this visit.  Specialty Comments:  No specialty comments available.  PMFS History:  Patient Active Problem List   Diagnosis Date Noted   Obesity 03/12/2016   Palpitations 07/07/2013   Acute sinusitis 04/21/2013   Obstructive sleep apnea (adult) (pediatric) 08/25/2012   Hypersomnia with sleep apnea, unspecified 08/25/2012   Bilateral lower extremity edema 11/27/2011   Obesity, Class III, BMI 40-49.9 (morbid obesity) ([place]) 10/26/2011   Essential hypertension 11/28/2008   IRRITABLE BOWEL SYNDROME - diarrhea predominant 11/10/2008   Past Medical History:  Diagnosis Date   Arthritis    "spine-DDD-intermittent sciatic pain"   Chronic cervicitis    resolved   Diabetes mellitus without complication (HCC)    diet controlled   GERD (gastroesophageal reflux disease)     Heart murmur    "benign"-routinely sees yearly - Dr. Deretha Emory, cardiology   History of kidney stones    multiple kidney stones-passed.   Hypertension    IBS (irritable bowel syndrome)    controlled with diet   Menorrhagia    resolved s/p Hysterectomy   Obesity    Sleep apnea    cpap used nightly   Vitamin D deficiency     Family History  Problem Relation Age of Onset   Hypertension Father    Hypertension Mother    Diabetes Mother    Coronary artery disease Mother    Sudden death Mother    Cancer Brother        carcinoid tumer of the appendix   Colon cancer Neg Hx    Esophageal cancer Neg Hx    Stomach cancer Neg Hx    Rectal cancer Neg Hx     Past Surgical History:  Procedure Laterality Date   BREAST LUMPECTOMY Left    benign hamartoma   CESAREAN SECTION     x1   COLONOSCOPY  10/19/2005   ENDOMETRIAL ABLATION     ESOPHAGOGASTRODUODENOSCOPY  12/21/2008   HYSTEROSCOPY WITH D & C     resection of endometrial polyp   LAPAROSCOPIC GASTRIC SLEEVE RESECTION N/A 03/12/2016   Procedure: LAPAROSCOPIC GASTRIC SLEEVE RESECTION WITH UPPER ENDO;  Surgeon: Arta Bruce Kinsinger, MD;  Location: [facility];  Service: General;  Laterality: N/A;   TUBAL LIGATION     Uterine Polyps Removed     VAGINAL HYSTERECTOMY     fibroids   Social History   Occupational History   Occupation: QUALITY ANALYST    Employer: [employer]  Tobacco Use   Smoking status: Never   Smokeless tobacco: Never  Vaping Use   Vaping Use: Never used  Substance and Sexual Activity   Alcohol use: Yes    Alcohol/week: 0.0 standard drinks    Comment: socially   Drug use: No   Sexual activity: Yes    Birth control/protection: Surgical

## 2020-12-26 NOTE — Patient Instructions (Signed)

## 2020-12-26 NOTE — Progress Notes (Signed)
PATIENT: Patricia [name] DOB: [birth year]  REASON FOR VISIT: follow up HISTORY FROM: patient  Chief Complaint  Patient presents with   Follow-up    RM 1, alone. Last seen 12/28/2019.       HISTORY OF PRESENT ILLNESS: 12/27/20 ALL: Patricia [name] returns for follow up for OSA on CPAP. She continues to do well with therapy. She is using CPAP most every night for at least 4 hours. She denies concerns with supplies or machine. She is sleeping fairly well. She lost her husband in 05/2020 to pancreatic cancer. He passed 1 month following diagnosis. She is seeing a Social worker and feels she is doing well. She has a great support system. She torn her ACL and recently released to return to activity. She is planning to start PT.     12/28/2019 ALL:  Patricia [name] is a 55 y.o. female here today for follow up for OSA on CPAP. She is doing well. She is using CPAP nightly. She denies concerns with machine or supplies. She is working with PCP for concerns of sciatica. She does note waking around 3am at times. She feels that her mind will start racing.  She has tried melatonin in the past that was helpful. Set up date 08/2018.   Compliance report dated 11/24/2019 through 12/23/2019 reveals that she used CPAP 30 of the past 30 days for compliance of 100%. She is CPAP greater than 4 hours all 30 days. Average usage was 7 hours and 13 minutes. Residual AHI was 0.7 on 8 cm of water and an EPR of 3. There was no significant leak noted.  HISTORY: (copied from my note on 12/25/2018)  Patricia [name] is a 55 y.o. female here today for follow up of OSA on CPAP. She is doing very well with her new CPAP machine.  Compliance report dated 11/24/2018 through 12/23/2018 reveals that she is using CPAP 27 out of the last 30 days for compliance of 90%.  27 days she used CPAP greater than 4 hours for compliance 90%.  Average usage was 7 hours and 15 minutes.  AHI was 0.6 on 8 cm of water and an EPR of 3.  There was no significant leak  noted.  She does report going out of town for a week and that contributes to days lost.   HISTORY: (copied from Dr Guadelupe Sabin note on 09/15/2018)   Patricia [name] is a very pleasant 55 year old right-handed woman with an underlying medical history of irritable bowel syndrome, morbid obesity, hypertension, reflux disease as well as kidney stones, who presents for follow-up consultation of her obstructive sleep apnea, well-established on treatment with CPAP. She is unaccompanied today. I last saw her on 08/15/2017, at which time she was stable on treatment.    Today, 09/15/2018: I reviewed her CPAP compliance data from 08/16/2018 through 09/14/2018, which is a total of 30 days, during which time she used her machine every night with percent used days greater than 4 hours at 93%, indicating excellent compliance with an average usage of 6 hours and 28 minutes, residual AHI at goal at 1.2/h, leak acceptable with a 95th percentile at 7.9 L/min on a pressure of 8 cm with EPR of 3.  She reports that her supplies are expensive.  She buys most of them online.  She should be eligible for new machine and I explained to her and we should try to get this through her DME company, [employer].  She would be willing to give it  a try.   She reports doing well. Her weight has been stable. She does exercise on a regular basis.  She has had some flareup of her right-sided sciatica.   The patient's allergies, current medications, family history, past medical history, past social history, past surgical history and problem list were reviewed and updated as appropriate.    REVIEW OF SYSTEMS: Out of a complete 14 system review of symptoms, the patient complains only of the following symptoms, fatigue, less motivation, left foot pain and all other reviewed systems are negative.  ESS: 11 FSS: 21   ALLERGIES: No Known Allergies  HOME MEDICATIONS: Outpatient Medications Prior to Visit  Medication Sig Dispense Refill   fluticasone  (FLONASE) 50 MCG/ACT nasal spray Place 2 sprays into both nostrils daily. 16 g 6   KLOR-CON M20 20 MEQ tablet TAKE 1 TABLET BY MOUTH EVERY DAY 30 tablet 8   losartan-hydrochlorothiazide (HYZAAR) 100-12.5 MG tablet Take 1 tablet by mouth daily. 90 tablet 3   Melatonin 10 MG TABS Take by mouth.     Multiple Vitamin (MULTIVITAMIN) capsule Take 1 capsule by mouth daily.     omeprazole (PRILOSEC) 40 MG capsule TAKE 1 CAPSULE BY MOUTH EVERY DAY 30 capsule 11   Probiotic Product (PROBIOTIC DAILY PO) Take 1 tablet by mouth daily.     traZODone (DESYREL) 50 MG tablet TAKE 1 TABLET BY MOUTH AT BEDTIME AS NEEDED FOR SLEEP. 90 tablet 2   atorvastatin (LIPITOR) 10 MG tablet Take 1 tablet (10 mg total) by mouth daily. 90 tablet 3   No facility-administered medications prior to visit.    PAST MEDICAL HISTORY: Past Medical History:  Diagnosis Date   Arthritis    "spine-DDD-intermittent sciatic pain"   Chronic cervicitis    resolved   Diabetes mellitus without complication (HCC)    diet controlled   GERD (gastroesophageal reflux disease)    Heart murmur    "benign"-routinely sees yearly - Dr. Deretha Emory, cardiology   History of kidney stones    multiple kidney stones-passed.   Hypertension    IBS (irritable bowel syndrome)    controlled with diet   Menorrhagia    resolved s/p Hysterectomy   Obesity    Sleep apnea    cpap used nightly   Vitamin D deficiency     PAST SURGICAL HISTORY: Past Surgical History:  Procedure Laterality Date   BREAST LUMPECTOMY Left    benign hamartoma   CESAREAN SECTION     x1   COLONOSCOPY  10/19/2005   ENDOMETRIAL ABLATION     ESOPHAGOGASTRODUODENOSCOPY  12/21/2008   HYSTEROSCOPY WITH D & C     resection of endometrial polyp   LAPAROSCOPIC GASTRIC SLEEVE RESECTION N/A 03/12/2016   Procedure: LAPAROSCOPIC GASTRIC SLEEVE RESECTION WITH UPPER ENDO;  Surgeon: Arta Bruce Kinsinger, MD;  Location: [facility];  Service: General;  Laterality: N/A;   TUBAL  LIGATION     Uterine Polyps Removed     VAGINAL HYSTERECTOMY     fibroids    FAMILY HISTORY: Family History  Problem Relation Age of Onset   Hypertension Father    Hypertension Mother    Diabetes Mother    Coronary artery disease Mother    Sudden death Mother    Cancer Brother        carcinoid tumer of the appendix   Colon cancer Neg Hx    Esophageal cancer Neg Hx    Stomach cancer Neg Hx    Rectal cancer Neg Hx  SOCIAL HISTORY: Social History   Socioeconomic History   Marital status: Widowed    Spouse name: Montine Circle   Number of children: 2   Years of education: 16   Highest education level: Not on file  Occupational History   Occupation: QUALITY ANALYST    Employer: [employer]  Tobacco Use   Smoking status: Never   Smokeless tobacco: Never  Vaping Use   Vaping Use: Never used  Substance and Sexual Activity   Alcohol use: Yes    Alcohol/week: 0.0 standard drinks    Comment: socially   Drug use: No   Sexual activity: Yes    Birth control/protection: Surgical  Other Topics Concern   Not on file  Social History Narrative   Married, college education, 2 children    Teacher, early years/pre for [employer]    patient is right handed and consumes green tea daily   Never smoker no tobacco, no drug use, only rare to occasional alcohol   Social Determinants of Radio broadcast assistant Strain: Not on file  Food Insecurity: Not on file  Transportation Needs: Not on file  Physical Activity: Not on file  Stress: Not on file  Social Connections: Not on file  Intimate Partner Violence: Not on file      PHYSICAL EXAM  Vitals:   12/27/20 0852  BP: (!) 142/82  Pulse: 70  SpO2: 98%  Weight: 261 lb 8 oz (118.6 kg)  Height: 5\' 1"  (1.549 m)    Body mass index is 49.41 kg/m.  Generalized: Well developed, in no acute distress  Cardiology: normal rate and rhythm, no murmur noted Respiratory: clear to auscultation bilaterally  Neurological examination   Mentation: Alert oriented to time, place, history taking. Follows all commands speech and language fluent Cranial nerve II-XII: Pupils were equal round reactive to light. Extraocular movements were full, visual field were full  Motor: The motor testing reveals 5 over 5 strength of all 4 extremities. Good symmetric motor tone is noted throughout.  Gait and station: Gait is normal.   DIAGNOSTIC DATA (LABS, IMAGING, TESTING) - I reviewed patient records, labs, notes, testing and imaging myself where available.  No flowsheet data found.   Lab Results  Component Value Date   WBC 4.5 07/07/2020   HGB 13.6 07/07/2020   HCT 41.2 07/07/2020   MCV 87.5 07/07/2020   PLT 229 07/07/2020      Component Value Date/Time   NA 142 07/07/2020 0831   NA 138 11/11/2017 1137   K 4.2 07/07/2020 0831   CL 105 07/07/2020 0831   CO2 28 07/07/2020 0831   GLUCOSE 97 07/07/2020 0831   BUN 15 07/07/2020 0831   BUN 11 11/11/2017 1137   CREATININE 0.72 07/07/2020 0831   CALCIUM 9.4 07/07/2020 0831   PROT 6.7 07/07/2020 0831   PROT 6.6 11/11/2017 1137   ALBUMIN 4.1 11/11/2017 1137   AST 16 07/07/2020 0831   ALT 12 07/07/2020 0831   ALKPHOS 96 11/11/2017 1137   BILITOT 0.5 07/07/2020 0831   BILITOT 0.3 11/11/2017 1137   GFRNONAA 96 07/07/2020 0831   GFRAA 111 07/07/2020 0831   Lab Results  Component Value Date   CHOL 173 07/07/2020   HDL 72 07/07/2020   LDLCALC 81 07/07/2020   TRIG 102 07/07/2020   CHOLHDL 2.4 07/07/2020   Lab Results  Component Value Date   HGBA1C 5.7 (H) 07/07/2020   Lab Results  Component Value Date   VITAMINB12 >2000 (H) 11/11/2017  Lab Results  Component Value Date   TSH 1.570 11/11/2017       ASSESSMENT AND PLAN 55 y.o. year old female  has a past medical history of Arthritis, Chronic cervicitis, Diabetes mellitus without complication ([place]), GERD (gastroesophageal reflux disease), Heart murmur, History of kidney stones, Hypertension, IBS (irritable bowel  syndrome), Menorrhagia, Obesity, Sleep apnea, and Vitamin D deficiency. here with     ICD-10-CM   1. OSA on CPAP  G47.33 For home use only DME continuous positive airway pressure (CPAP)   Z99.89        Patricia [name] is doing very well on CPAP. Compliance report reveals excellent compliance. She was encouraged to continue using CPAP nightly and for greater than 4 hours each night. We will update supply orders. She will continue close follow up with psychology and PCP. EDS score is elevated and most likely related to recent loss and injury. She will continue to work on increasing activity and monitor for any worsening symptoms. Healthy lifestyle habits encouraged. She will follow-up with me in 1 year, sooner if needed. She verbalizes understanding and agreement with this plan.   Orders Placed This Encounter  Procedures   For home use only DME continuous positive airway pressure (CPAP)    Supplies    Order Specific Question:   Length of Need    Answer:   Lifetime    Order Specific Question:   Patient has OSA or probable OSA    Answer:   Yes    Order Specific Question:   Is the patient currently using CPAP in the home    Answer:   Yes    Order Specific Question:   Settings    Answer:   Other see comments    Order Specific Question:   CPAP supplies needed    Answer:   Mask, headgear, cushions, filters, heated tubing and water chamber      No orders of the defined types were placed in this encounter.      Debbora Presto, FNP-C 12/27/2020, 9:30 AM [facility] [address] [place], [place] [address] [phone]

## 2020-12-27 ENCOUNTER — Ambulatory Visit: Payer: 59 | Admitting: Family Medicine

## 2020-12-27 ENCOUNTER — Other Ambulatory Visit: Payer: Self-pay

## 2020-12-27 ENCOUNTER — Encounter: Payer: Self-pay | Admitting: Family Medicine

## 2020-12-27 VITALS — BP 142/82 | HR 70 | Ht 61.0 in | Wt 261.5 lb

## 2020-12-27 DIAGNOSIS — G4733 Obstructive sleep apnea (adult) (pediatric): Secondary | ICD-10-CM

## 2020-12-27 DIAGNOSIS — Z9989 Dependence on other enabling machines and devices: Secondary | ICD-10-CM

## 2020-12-28 ENCOUNTER — Ambulatory Visit (INDEPENDENT_AMBULATORY_CARE_PROVIDER_SITE_OTHER): Payer: 59 | Admitting: *Deleted

## 2020-12-28 ENCOUNTER — Other Ambulatory Visit: Payer: Self-pay

## 2020-12-28 DIAGNOSIS — Z23 Encounter for immunization: Secondary | ICD-10-CM | POA: Diagnosis not present

## 2021-01-02 ENCOUNTER — Ambulatory Visit: Payer: 59 | Admitting: Rehabilitative and Restorative Service Providers"

## 2021-01-02 ENCOUNTER — Encounter: Payer: Self-pay | Admitting: Rehabilitative and Restorative Service Providers"

## 2021-01-02 ENCOUNTER — Other Ambulatory Visit: Payer: Self-pay

## 2021-01-02 DIAGNOSIS — R262 Difficulty in walking, not elsewhere classified: Secondary | ICD-10-CM | POA: Diagnosis not present

## 2021-01-02 DIAGNOSIS — R600 Localized edema: Secondary | ICD-10-CM

## 2021-01-02 DIAGNOSIS — M6281 Muscle weakness (generalized): Secondary | ICD-10-CM | POA: Diagnosis not present

## 2021-01-02 DIAGNOSIS — M79662 Pain in left lower leg: Secondary | ICD-10-CM | POA: Diagnosis not present

## 2021-01-02 NOTE — Patient Instructions (Signed)
Access Code: [email or web address] URL: [email or web address] Date: 01/02/2021 Prepared by: Scot Jun  Exercises Long Sitting Ankle Plantar Flexion with Resistance - 2 x daily - 7 x weekly - 3 sets - 10-15 reps Long Sitting Ankle Eversion with Resistance - 2 x daily - 7 x weekly - 3 sets - 10-15 reps Standing Dorsiflexion Self-Mobilization on Step - 2 x daily - 7 x weekly - 3 sets - 10 reps - 2-3 hold Seated Heel Raise - 2 x daily - 7 x weekly - 3 sets - 10 reps Seated Toe Raise - 2 x daily - 7 x weekly - 3 sets - 10 reps Gastroc Stretch on Wall - 2 x daily - 7 x weekly - 1 sets - 5 reps - 30 hold

## 2021-01-02 NOTE — Therapy (Signed)
[facility] [address] [place], [place], [address] Phone: [phone]   Fax:  [phone]  Physical Therapy Evaluation  Patient Details  Name: Patricia [name] MRN: [id number] Date of Birth: [birth year] Referring Provider (PT): Newt Minion, MD   Encounter Date: 01/02/2021   PT End of Session - 01/02/21 0806     Visit Number 54    Number of Visits 54    Date for PT Re-Evaluation 03/13/21    Authorization Type [employer] $10 copay    PT Start Time 0806    PT Stop Time 0834    PT Time Calculation (min) 28 min    Activity Tolerance Patient tolerated treatment well    Behavior During Therapy [facility] for tasks assessed/performed             Past Medical History:  Diagnosis Date   Arthritis    "spine-DDD-intermittent sciatic pain"   Chronic cervicitis    resolved   Diabetes mellitus without complication (HCC)    diet controlled   GERD (gastroesophageal reflux disease)    Heart murmur    "benign"-routinely sees yearly - Dr. Deretha Emory, cardiology   History of kidney stones    multiple kidney stones-passed.   Hypertension    IBS (irritable bowel syndrome)    controlled with diet   Menorrhagia    resolved s/p Hysterectomy   Obesity    Sleep apnea    cpap used nightly   Vitamin D deficiency     Past Surgical History:  Procedure Laterality Date   BREAST LUMPECTOMY Left    benign hamartoma   CESAREAN SECTION     x1   COLONOSCOPY  10/19/2005   ENDOMETRIAL ABLATION     ESOPHAGOGASTRODUODENOSCOPY  12/21/2008   HYSTEROSCOPY WITH D & C     resection of endometrial polyp   LAPAROSCOPIC GASTRIC SLEEVE RESECTION N/A 03/12/2016   Procedure: LAPAROSCOPIC GASTRIC SLEEVE RESECTION WITH UPPER ENDO;  Surgeon: Arta Bruce Kinsinger, MD;  Location: [facility];  Service: General;  Laterality: N/A;   TUBAL LIGATION     Uterine Polyps Removed     VAGINAL HYSTERECTOMY     fibroids    There were no vitals filed for this visit.    Subjective  Assessment - 01/02/21 0808     Subjective Pt. indicated around July 4th she went to [place] and she went walking every morning like she normally would and one day she caught foot on sidewalk that led to injury and visit to Urgent Care due to inability to walk, swelling.  Pt. stayed off it for several week and still had trouble.  Pt. came home after 1 month of trip and saw Dr. Sharol Given about foot.  Pt. indicated MRI was performed showing partial tear in Achilles. Wore boot for about a month to promote healing and now has progressed to walking in normal shoe with shoe lift and has weakness, limits in walking/standing.    Limitations Standing;Walking;House hold activities    How long can you stand comfortably? 10-15 mins    Diagnostic tests MRI    Patient Stated Goals Reduce pain and return to work.    Currently in Pain? Yes    Pain Score 5    pain at worst in last few weeks   Pain Location Ankle    Pain Orientation Left    Pain Descriptors / Indicators Throbbing    Pain Type Chronic pain    Pain Onset More than a month ago  Pain Frequency Intermittent    Aggravating Factors  standing, walking, housework activity, worse at end of day    Pain Relieving Factors elevation, ice, rest                [facility] PT Assessment - 01/02/21 0001       Assessment   Medical Diagnosis S86.012S (ICD-10-CM) - Partial rupture of Achilles tendon, left, sequela    Referring Provider (PT) Newt Minion, MD    Onset Date/Surgical Date 10/24/20    Hand Dominance Right      Precautions   Precautions None      Balance Screen   Has the patient fallen in the past 6 months No    Has the patient had a decrease in activity level because of a fear of falling?  No    Is the patient reluctant to leave their home because of a fear of falling?  No      Home Environment   Additional Comments Stairs at home, 2nd story bedroom      Prior Function   Leisure walking (2.5 miles in morning), household activity       Observation/Other Assessments   Observations Localized edema Lt ankle    Focus on Therapeutic Outcomes (FOTO)  intake 51%, predicted 66%      Functional Tests   Functional tests Sit to Stand;Single leg stance      Sit to Stand   Comments 18 inch chair; no complaints, on 1st try      ROM / Strength   AROM / PROM / Strength Strength;PROM;AROM      AROM   Overall AROM Comments PF, inversion, eversion WFL bilateral    AROM Assessment Site Ankle    Right/Left Ankle Left;Right    Right Ankle Dorsiflexion 5   in 90 deg knee flexion, -5 (in PF) c 0 deg knee extension   Left Ankle Dorsiflexion 0   in 90 deg knee flexion, -8 (in PF) c 0 deg knee extension     PROM   PROM Assessment Site Ankle    Right/Left Ankle Left;Right      Strength   Strength Assessment Site Knee;Ankle    Right/Left Knee Left;Right    Right Knee Flexion 5/5    Right Knee Extension 5/5    Left Knee Flexion 5/5    Left Knee Extension 5/5    Right/Left Ankle Left;Right    Right Ankle Dorsiflexion 5/5    Right Ankle Plantar Flexion 3+/5   5 reps in single leg   Right Ankle Inversion 5/5    Right Ankle Eversion 5/5    Left Ankle Dorsiflexion 5/5    Left Ankle Plantar Flexion 2+/5   unable to perform single leg calf raise in standing   Left Ankle Inversion 5/5    Left Ankle Eversion 4/5      Ambulation/Gait   Gait Comments Independent ambulation no devices                        Objective measurements completed on examination: See above findings.       [facility] Adult PT Treatment/Exercise - 01/02/21 0001       Exercises   Exercises Other Exercises;Ankle    Other Exercises  HEP instruction/performance c cues for techniques, handout provided.  Trial set performed of each for comprehension and symptom assessment.  HEP consisting of band PF and eversion, DF on step mobility, gastroc on wall stretch, seated  DF/PF                     PT Education - 01/02/21 0827     Education Details  HEP, POC    Person(s) Educated Patient    Methods Explanation;Demonstration;Verbal cues;Handout    Comprehension Verbalized understanding;Returned demonstration              PT Short Term Goals - 01/02/21 0804       PT SHORT TERM GOAL #1   Title Patient will demonstrate independent use of home exercise program to maintain progress from in clinic treatments.    Time 3    Period Weeks    Status New    Target Date 01/23/21               PT Long Term Goals - 01/02/21 0804       PT LONG TERM GOAL #1   Title Patient will demonstrate/report pain at worst less than or equal to 2/10 to facilitate minimal limitation in daily activity secondary to pain symptoms.    Time 10    Period Weeks    Status New    Target Date 03/13/21      PT LONG TERM GOAL #2   Title Patient will demonstrate independent use of home exercise program to facilitate ability to maintain/progress functional gains from skilled physical therapy services.    Time 10    Period Weeks    Status New    Target Date 03/13/21      PT LONG TERM GOAL #3   Title Pt. will demonstrate FOTO outcome > or = 66% to indicated reduced disability due to condition.    Time 10    Period Weeks    Status New    Target Date 03/13/21      PT LONG TERM GOAL #4   Title Patient will demonstrate Lt leg MMT PF 4/5, eversion 5/5 to facilitate ability to perform usual standing, walking, stairs at PLOF s limitation due to symptoms.    Time 10    Period Weeks    Status New    Target Date 03/13/21      PT LONG TERM GOAL #5   Title Pt. will demonstrate bilateral SLS > 15 seconds to facilitate stability in ambulation.    Time 10    Period Weeks    Status New    Target Date 03/13/21      Additional Long Term Goals   Additional Long Term Goals Yes      PT LONG TERM GOAL #6   Title Pt. will demonstrate Lt DF mobility equal to Rt to facilitate normal mobility in ambulation.    Time 10    Period Weeks    Status New    Target  Date 03/13/21                    Plan - 01/02/21 0827     Clinical Impression Statement Patient is a 55 y.o. who comes to clinic with complaints of Lt ankle/achilles pain with mobility, strength and movement coordination deficits that impair their ability to perform usual daily and recreational functional activities without increase difficulty/symptoms at this time.  Patient to benefit from skilled PT services to address impairments and limitations to improve to previous level of function without restriction secondary to condition.    Personal Factors and Comorbidities Comorbidity 2    Comorbidities DM, HTN    Stability/Clinical Decision Making Stable/Uncomplicated  Clinical Decision Making Low    Rehab Potential Good    PT Frequency Other (comment)   1-2x/week   PT Duration Other (comment)   10 weeks   PT Treatment/Interventions ADLs/Self Care Home Management;Cryotherapy;Electrical Stimulation;Iontophoresis 4mg /ml Dexamethasone;Moist Heat;Balance training;Therapeutic exercise;Therapeutic activities;Functional mobility training;Stair training;Gait training;DME Instruction;Ultrasound;Neuromuscular re-education;Patient/family education;Passive range of motion;Dry needling;Joint Manipulations;Taping;Vasopneumatic Device;Manual techniques    PT Next Visit Plan Progress strengthening, DF mobility    PT Home Exercise Plan [email or web address]    Consulted and Agree with Plan of Care Patient             Patient will benefit from skilled therapeutic intervention in order to improve the following deficits and impairments:  Decreased endurance, Hypomobility, Decreased activity tolerance, Decreased strength, Pain, Decreased balance, Decreased mobility, Difficulty walking, Impaired perceived functional ability, Improper body mechanics, Impaired flexibility, Decreased coordination, Decreased range of motion  Visit Diagnosis: Pain in left lower leg  Muscle weakness (generalized)  Difficulty in  walking, not elsewhere classified  Localized edema     Problem List Patient Active Problem List   Diagnosis Date Noted   Obesity 03/12/2016   Palpitations 07/07/2013   Acute sinusitis 04/21/2013   Obstructive sleep apnea (adult) (pediatric) 08/25/2012   Hypersomnia with sleep apnea, unspecified 08/25/2012   Bilateral lower extremity edema 11/27/2011   Obesity, Class III, BMI 40-49.9 (morbid obesity) ([place]) 10/26/2011   Essential hypertension 11/28/2008   IRRITABLE BOWEL SYNDROME - diarrhea predominant 11/10/2008   Scot Jun, PT, DPT, OCS, ATC 01/02/21  8:54 AM    [facility] [address] [place], [place], [address] Phone: [phone]   Fax:  [phone]  Name: MOLLEY HOUSER MRN: [id number] Date of Birth: 01-30-1966

## 2021-01-12 ENCOUNTER — Encounter: Payer: Self-pay | Admitting: Rehabilitative and Restorative Service Providers"

## 2021-01-12 ENCOUNTER — Other Ambulatory Visit: Payer: Self-pay

## 2021-01-12 ENCOUNTER — Ambulatory Visit (INDEPENDENT_AMBULATORY_CARE_PROVIDER_SITE_OTHER): Payer: 59 | Admitting: Rehabilitative and Restorative Service Providers"

## 2021-01-12 DIAGNOSIS — M6281 Muscle weakness (generalized): Secondary | ICD-10-CM | POA: Diagnosis not present

## 2021-01-12 DIAGNOSIS — M79662 Pain in left lower leg: Secondary | ICD-10-CM

## 2021-01-12 DIAGNOSIS — R600 Localized edema: Secondary | ICD-10-CM

## 2021-01-12 DIAGNOSIS — R262 Difficulty in walking, not elsewhere classified: Secondary | ICD-10-CM | POA: Diagnosis not present

## 2021-01-12 NOTE — Patient Instructions (Signed)
Access Code: [email or web address] URL: [email or web address] Date: 01/12/2021 Prepared by: Scot Jun  Exercises Long Sitting Ankle Plantar Flexion with Resistance - 2 x daily - 7 x weekly - 3 sets - 10-15 reps Long Sitting Ankle Eversion with Resistance - 2 x daily - 7 x weekly - 3 sets - 10-15 reps Standing Dorsiflexion Self-Mobilization on Step - 2 x daily - 7 x weekly - 3 sets - 10 reps - 2-3 hold Seated Heel Raise - 2 x daily - 7 x weekly - 3 sets - 10 reps Seated Toe Raise - 2 x daily - 7 x weekly - 3 sets - 10 reps Gastroc Stretch on Wall - 2 x daily - 7 x weekly - 1 sets - 5 reps - 30 hold Tandem Stance - 1 x daily - 7 x weekly - 1 sets - 5 reps - 30-60 seconds hold

## 2021-01-12 NOTE — Therapy (Signed)
[facility] [address] [place], [place], [address] Phone: [phone]   Fax:  [phone]  Physical Therapy Treatment  Patient Details  Name: Patricia [name] MRN: [id number] Date of Birth: [birth year] Referring Provider (PT): Newt Minion, MD   Encounter Date: 01/12/2021   PT End of Session - 01/12/21 0939     Visit Number 2    Number of Visits 20    Date for PT Re-Evaluation 03/13/21    Authorization Type [employer] $10 copay    PT Start Time 0932    PT Stop Time 1022    PT Time Calculation (min) 50 min    Activity Tolerance Patient tolerated treatment well    Behavior During Therapy WFL for tasks assessed/performed             Past Medical History:  Diagnosis Date   Arthritis    "spine-DDD-intermittent sciatic pain"   Chronic cervicitis    resolved   Diabetes mellitus without complication (HCC)    diet controlled   GERD (gastroesophageal reflux disease)    Heart murmur    "benign"-routinely sees yearly - Dr. Deretha Emory, cardiology   History of kidney stones    multiple kidney stones-passed.   Hypertension    IBS (irritable bowel syndrome)    controlled with diet   Menorrhagia    resolved s/p Hysterectomy   Obesity    Sleep apnea    cpap used nightly   Vitamin D deficiency     Past Surgical History:  Procedure Laterality Date   BREAST LUMPECTOMY Left    benign hamartoma   CESAREAN SECTION     x1   COLONOSCOPY  10/19/2005   ENDOMETRIAL ABLATION     ESOPHAGOGASTRODUODENOSCOPY  12/21/2008   HYSTEROSCOPY WITH D & C     resection of endometrial polyp   LAPAROSCOPIC GASTRIC SLEEVE RESECTION N/A 03/12/2016   Procedure: LAPAROSCOPIC GASTRIC SLEEVE RESECTION WITH UPPER ENDO;  Surgeon: Arta Bruce Kinsinger, MD;  Location: [facility];  Service: General;  Laterality: N/A;   TUBAL LIGATION     Uterine Polyps Removed     VAGINAL HYSTERECTOMY     fibroids    There were no vitals filed for this visit.   Subjective  Assessment - 01/12/21 0934     Subjective Pt. indicated sweeling noted c standing/walking.  Pt. stated no complaints of pain upon arrival specifically.    Limitations Standing;Walking;House hold activities    How long can you stand comfortably? --    Diagnostic tests MRI    Patient Stated Goals Reduce pain and return to work.    Currently in Pain? No/denies    Pain Score 0-No pain    Pain Location Ankle    Pain Orientation Left    Pain Descriptors / Indicators Throbbing;Tightness    Pain Type Chronic pain    Pain Onset More than a month ago    Pain Frequency Intermittent    Aggravating Factors  prolonged standing    Pain Relieving Factors rest                [facility] PT Assessment - 01/12/21 0001       Assessment   Medical Diagnosis S86.012S (ICD-10-CM) - Partial rupture of Achilles tendon, left, sequela    Referring Provider (PT) Newt Minion, MD    Onset Date/Surgical Date 10/24/20    Hand Dominance Right      AROM   Left Ankle Dorsiflexion 5   in sitting 90  deg knee flexion                          [facility] Adult PT Treatment/Exercise - 01/12/21 0001       Neuro Re-ed    Neuro Re-ed Details  tandem stance 1 min x 2 bilateral, fitter wobble board fwd/back each way      Modalities   Modalities Vasopneumatic      Vasopneumatic   Number Minutes Vasopneumatic  10 minutes    Vasopnuematic Location  Ankle   Lt   Vasopneumatic Pressure Medium    Vasopneumatic Temperature  34      Ankle Exercises: Standing   Other Standing Ankle Exercises 4 way green band 3x15 each    Other Standing Ankle Exercises heel/toe raise 20x bilateral, lateral step up 4 inch      Ankle Exercises: Stretches   Other Stretch DF on step 2 x 10 Lt                       PT Short Term Goals - 01/12/21 0940       PT SHORT TERM GOAL #1   Title Patient will demonstrate independent use of home exercise program to maintain progress from in clinic treatments.    Time  3    Period Weeks    Status On-going    Target Date 01/23/21               PT Long Term Goals - 01/02/21 0804       PT LONG TERM GOAL #1   Title Patient will demonstrate/report pain at worst less than or equal to 2/10 to facilitate minimal limitation in daily activity secondary to pain symptoms.    Time 10    Period Weeks    Status New    Target Date 03/13/21      PT LONG TERM GOAL #2   Title Patient will demonstrate independent use of home exercise program to facilitate ability to maintain/progress functional gains from skilled physical therapy services.    Time 10    Period Weeks    Status New    Target Date 03/13/21      PT LONG TERM GOAL #3   Title Pt. will demonstrate FOTO outcome > or = 66% to indicated reduced disability due to condition.    Time 10    Period Weeks    Status New    Target Date 03/13/21      PT LONG TERM GOAL #4   Title Patient will demonstrate Lt leg MMT PF 4/5, eversion 5/5 to facilitate ability to perform usual standing, walking, stairs at PLOF s limitation due to symptoms.    Time 10    Period Weeks    Status New    Target Date 03/13/21      PT LONG TERM GOAL #5   Title Pt. will demonstrate bilateral SLS > 15 seconds to facilitate stability in ambulation.    Time 10    Period Weeks    Status New    Target Date 03/13/21      Additional Long Term Goals   Additional Long Term Goals Yes      PT LONG TERM GOAL #6   Title Pt. will demonstrate Lt DF mobility equal to Rt to facilitate normal mobility in ambulation.    Time 10    Period Weeks    Status New    Target Date  03/13/21                   Plan - 01/12/21 0950     Clinical Impression Statement Pt. to benefit from continued strengthening and movement coordination intervention to improve stability and tolerance to standing activity during day.  Pt. did demonstrate gain in DF mobility as noted.    Personal Factors and Comorbidities Comorbidity 2    Comorbidities DM, HTN     Stability/Clinical Decision Making Stable/Uncomplicated    Rehab Potential Good    PT Frequency Other (comment)   1-2x/week   PT Duration Other (comment)   10 weeks   PT Treatment/Interventions ADLs/Self Care Home Management;Cryotherapy;Electrical Stimulation;Iontophoresis 4mg /ml Dexamethasone;Moist Heat;Balance training;Therapeutic exercise;Therapeutic activities;Functional mobility training;Stair training;Gait training;DME Instruction;Ultrasound;Neuromuscular re-education;Patient/family education;Passive range of motion;Dry needling;Joint Manipulations;Taping;Vasopneumatic Device;Manual techniques    PT Next Visit Plan Strengthening continued - in WB as tolerated.  Compliant surface balance intervention.    PT Home Exercise Plan [id number]    Consulted and Agree with Plan of Care Patient             Patient will benefit from skilled therapeutic intervention in order to improve the following deficits and impairments:  Decreased endurance, Hypomobility, Decreased activity tolerance, Decreased strength, Pain, Decreased balance, Decreased mobility, Difficulty walking, Impaired perceived functional ability, Improper body mechanics, Impaired flexibility, Decreased coordination, Decreased range of motion  Visit Diagnosis: Pain in left lower leg  Muscle weakness (generalized)  Difficulty in walking, not elsewhere classified  Localized edema     Problem List Patient Active Problem List   Diagnosis Date Noted   Obesity 03/12/2016   Palpitations 07/07/2013   Acute sinusitis 04/21/2013   Obstructive sleep apnea (adult) (pediatric) 08/25/2012   Hypersomnia with sleep apnea, unspecified 08/25/2012   Bilateral lower extremity edema 11/27/2011   Obesity, Class III, BMI 40-49.9 (morbid obesity) ([place]) 10/26/2011   Essential hypertension 11/28/2008   IRRITABLE BOWEL SYNDROME - diarrhea predominant 11/10/2008    Scot Jun, PT, DPT, OCS, ATC 01/12/21  10:13 AM    [facility] [address] [place], [place], [address] Phone: [phone]   Fax:  [phone]  Name: Patricia [name] MRN: [id number] Date of Birth: 1965/09/29

## 2021-01-13 ENCOUNTER — Ambulatory Visit: Payer: 59 | Admitting: Rehabilitative and Restorative Service Providers"

## 2021-01-13 DIAGNOSIS — R262 Difficulty in walking, not elsewhere classified: Secondary | ICD-10-CM | POA: Diagnosis not present

## 2021-01-13 DIAGNOSIS — R600 Localized edema: Secondary | ICD-10-CM | POA: Diagnosis not present

## 2021-01-13 DIAGNOSIS — M6281 Muscle weakness (generalized): Secondary | ICD-10-CM

## 2021-01-13 DIAGNOSIS — M79662 Pain in left lower leg: Secondary | ICD-10-CM

## 2021-01-13 NOTE — Therapy (Signed)
[facility] [address] [place], [place], [address] Phone: [phone]   Fax:  [phone]  Physical Therapy Treatment  Patient Details  Name: TANVI GATLING MRN: [id number] Date of Birth: [birth year] Referring Provider (PT): Newt Minion, MD   Encounter Date: 01/13/2021   PT End of Session - 01/13/21 1015     Visit Number 3    Number of Visits 20    Date for PT Re-Evaluation 03/13/21    Authorization Type [employer] $10 copay    PT Start Time 1011    PT Stop Time 1101    PT Time Calculation (min) 50 min    Activity Tolerance Patient tolerated treatment well    Behavior During Therapy WFL for tasks assessed/performed             Past Medical History:  Diagnosis Date   Arthritis    "spine-DDD-intermittent sciatic pain"   Chronic cervicitis    resolved   Diabetes mellitus without complication (HCC)    diet controlled   GERD (gastroesophageal reflux disease)    Heart murmur    "benign"-routinely sees yearly - Dr. Deretha Emory, cardiology   History of kidney stones    multiple kidney stones-passed.   Hypertension    IBS (irritable bowel syndrome)    controlled with diet   Menorrhagia    resolved s/p Hysterectomy   Obesity    Sleep apnea    cpap used nightly   Vitamin D deficiency     Past Surgical History:  Procedure Laterality Date   BREAST LUMPECTOMY Left    benign hamartoma   CESAREAN SECTION     x1   COLONOSCOPY  10/19/2005   ENDOMETRIAL ABLATION     ESOPHAGOGASTRODUODENOSCOPY  12/21/2008   HYSTEROSCOPY WITH D & C     resection of endometrial polyp   LAPAROSCOPIC GASTRIC SLEEVE RESECTION N/A 03/12/2016   Procedure: LAPAROSCOPIC GASTRIC SLEEVE RESECTION WITH UPPER ENDO;  Surgeon: Arta Bruce Kinsinger, MD;  Location: [facility];  Service: General;  Laterality: N/A;   TUBAL LIGATION     Uterine Polyps Removed     VAGINAL HYSTERECTOMY     fibroids    There were no vitals filed for this visit.   Subjective  Assessment - 01/13/21 1014     Subjective Pt. stated no pian upon arrival today.  Felt pulling at times in area.    Limitations Standing;Walking;House hold activities    Diagnostic tests MRI    Patient Stated Goals Reduce pain and return to work.    Currently in Pain? No/denies    Pain Score 0-No pain    Pain Onset More than a month ago                               [facility] Adult PT Treatment/Exercise - 01/13/21 0001       Neuro Re-ed    Neuro Re-ed Details  tandem stance on foam 1 min x 1 bilateral, tandem ambulation on foam fwd 8 ft x 12 each, fitter wobble board fwd/back 30 x each, SLS c cone touching (anterior, anterior/medial, anterior/lateral) x 10 each bilateral      Vasopneumatic   Number Minutes Vasopneumatic  10 minutes    Vasopnuematic Location  Ankle   Lt   Vasopneumatic Pressure Medium    Vasopneumatic Temperature  34      Ankle Exercises: Aerobic   Recumbent Bike Lvl 2 6 mins  Ankle Exercises: Standing   Heel Raises Both;20 reps   attempted to lowering eccentrically with more weight on Lt than Rt   Other Standing Ankle Exercises lateral step down control 4 inch 2 x 10 bilateral      Ankle Exercises: Stretches   Gastroc Stretch 30 seconds;3 reps   incline board (gentle)                      PT Short Term Goals - 01/12/21 0940       PT SHORT TERM GOAL #1   Title Patient will demonstrate independent use of home exercise program to maintain progress from in clinic treatments.    Time 3    Period Weeks    Status On-going    Target Date 01/23/21               PT Long Term Goals - 01/02/21 0804       PT LONG TERM GOAL #1   Title Patient will demonstrate/report pain at worst less than or equal to 2/10 to facilitate minimal limitation in daily activity secondary to pain symptoms.    Time 10    Period Weeks    Status New    Target Date 03/13/21      PT LONG TERM GOAL #2   Title Patient will demonstrate independent  use of home exercise program to facilitate ability to maintain/progress functional gains from skilled physical therapy services.    Time 10    Period Weeks    Status New    Target Date 03/13/21      PT LONG TERM GOAL #3   Title Pt. will demonstrate FOTO outcome > or = 66% to indicated reduced disability due to condition.    Time 10    Period Weeks    Status New    Target Date 03/13/21      PT LONG TERM GOAL #4   Title Patient will demonstrate Lt leg MMT PF 4/5, eversion 5/5 to facilitate ability to perform usual standing, walking, stairs at PLOF s limitation due to symptoms.    Time 10    Period Weeks    Status New    Target Date 03/13/21      PT LONG TERM GOAL #5   Title Pt. will demonstrate bilateral SLS > 15 seconds to facilitate stability in ambulation.    Time 10    Period Weeks    Status New    Target Date 03/13/21      Additional Long Term Goals   Additional Long Term Goals Yes      PT LONG TERM GOAL #6   Title Pt. will demonstrate Lt DF mobility equal to Rt to facilitate normal mobility in ambulation.    Time 10    Period Weeks    Status New    Target Date 03/13/21                   Plan - 01/13/21 1041     Clinical Impression Statement Fair performance on SLS and compliant surface activity today, showing necessity for continued skilled PT and HEP interventions.  HEP knowledge was good so withheld repeated some of those interventions today in clinic.    Personal Factors and Comorbidities Comorbidity 2    Comorbidities DM, HTN    Stability/Clinical Decision Making Stable/Uncomplicated    Rehab Potential Good    PT Frequency Other (comment)   1-2x/week   PT Duration Other (comment)  10 weeks   PT Treatment/Interventions ADLs/Self Care Home Management;Cryotherapy;Electrical Stimulation;Iontophoresis 4mg /ml Dexamethasone;Moist Heat;Balance training;Therapeutic exercise;Therapeutic activities;Functional mobility training;Stair training;Gait training;DME  Instruction;Ultrasound;Neuromuscular re-education;Patient/family education;Passive range of motion;Dry needling;Joint Manipulations;Taping;Vasopneumatic Device;Manual techniques    PT Next Visit Plan Continue compliant surface and dynamic balance, progressive strengthening. vaso as desired.    PT Home Exercise Plan [id number]    Consulted and Agree with Plan of Care Patient             Patient will benefit from skilled therapeutic intervention in order to improve the following deficits and impairments:  Decreased endurance, Hypomobility, Decreased activity tolerance, Decreased strength, Pain, Decreased balance, Decreased mobility, Difficulty walking, Impaired perceived functional ability, Improper body mechanics, Impaired flexibility, Decreased coordination, Decreased range of motion  Visit Diagnosis: Pain in left lower leg  Muscle weakness (generalized)  Difficulty in walking, not elsewhere classified  Localized edema     Problem List Patient Active Problem List   Diagnosis Date Noted   Obesity 03/12/2016   Palpitations 07/07/2013   Acute sinusitis 04/21/2013   Obstructive sleep apnea (adult) (pediatric) 08/25/2012   Hypersomnia with sleep apnea, unspecified 08/25/2012   Bilateral lower extremity edema 11/27/2011   Obesity, Class III, BMI 40-49.9 (morbid obesity) ([place]) 10/26/2011   Essential hypertension 11/28/2008   IRRITABLE BOWEL SYNDROME - diarrhea predominant 11/10/2008    Scot Jun, PT, DPT, OCS, ATC 01/13/21  10:47 AM    [facility] [address] [place], [place], [address] Phone: [phone]   Fax:  [phone]  Name: KERRYN TENNANT MRN: [id number] Date of Birth: [birth year]

## 2021-01-15 ENCOUNTER — Other Ambulatory Visit: Payer: Self-pay | Admitting: Family Medicine

## 2021-01-16 ENCOUNTER — Other Ambulatory Visit: Payer: Self-pay

## 2021-01-16 ENCOUNTER — Ambulatory Visit: Payer: 59 | Admitting: Rehabilitative and Restorative Service Providers"

## 2021-01-16 ENCOUNTER — Encounter: Payer: Self-pay | Admitting: Rehabilitative and Restorative Service Providers"

## 2021-01-16 DIAGNOSIS — M6281 Muscle weakness (generalized): Secondary | ICD-10-CM

## 2021-01-16 DIAGNOSIS — M79662 Pain in left lower leg: Secondary | ICD-10-CM | POA: Diagnosis not present

## 2021-01-16 DIAGNOSIS — R262 Difficulty in walking, not elsewhere classified: Secondary | ICD-10-CM | POA: Diagnosis not present

## 2021-01-16 DIAGNOSIS — R600 Localized edema: Secondary | ICD-10-CM

## 2021-01-16 NOTE — Therapy (Signed)
[facility] [address] [place], [place], [address] Phone: [phone]   Fax:  [phone]  Physical Therapy Treatment  Patient Details  Name: Patricia [name] MRN: [id number] Date of Birth: 1965/05/13 Referring Provider (PT): Newt Minion, MD   Encounter Date: 01/16/2021   PT End of Session - 01/16/21 0843     Visit Number 4    Number of Visits 20    Date for PT Re-Evaluation 03/13/21    Authorization Type [employer] $10 copay    PT Start Time 0842    PT Stop Time 0934    PT Time Calculation (min) 52 min    Activity Tolerance Patient tolerated treatment well    Behavior During Therapy [facility] for tasks assessed/performed             Past Medical History:  Diagnosis Date   Arthritis    "spine-DDD-intermittent sciatic pain"   Chronic cervicitis    resolved   Diabetes mellitus without complication (HCC)    diet controlled   GERD (gastroesophageal reflux disease)    Heart murmur    "benign"-routinely sees yearly - Dr. Deretha Emory, cardiology   History of kidney stones    multiple kidney stones-passed.   Hypertension    IBS (irritable bowel syndrome)    controlled with diet   Menorrhagia    resolved s/p Hysterectomy   Obesity    Sleep apnea    cpap used nightly   Vitamin D deficiency     Past Surgical History:  Procedure Laterality Date   BREAST LUMPECTOMY Left    benign hamartoma   CESAREAN SECTION     x1   COLONOSCOPY  10/19/2005   ENDOMETRIAL ABLATION     ESOPHAGOGASTRODUODENOSCOPY  12/21/2008   HYSTEROSCOPY WITH D & C     resection of endometrial polyp   LAPAROSCOPIC GASTRIC SLEEVE RESECTION N/A 03/12/2016   Procedure: LAPAROSCOPIC GASTRIC SLEEVE RESECTION WITH UPPER ENDO;  Surgeon: Arta Bruce Kinsinger, MD;  Location: [facility];  Service: General;  Laterality: N/A;   TUBAL LIGATION     Uterine Polyps Removed     VAGINAL HYSTERECTOMY     fibroids    There were no vitals filed for this visit.   Subjective  Assessment - 01/16/21 0849     Subjective Pt. reported no pain today.  Swelling noted from weekend, noticed after standing prolonged.    Limitations Standing;Walking;House hold activities    Diagnostic tests MRI    Patient Stated Goals Reduce pain and return to work.    Currently in Pain? No/denies    Pain Score 0-No pain    Pain Location Ankle    Pain Orientation Left    Pain Type Chronic pain    Pain Onset More than a month ago    Pain Frequency Occasional    Aggravating Factors  swelling after prolonged standing/walking                               [facility] Adult PT Treatment/Exercise - 01/16/21 0001       Neuro Re-ed    Neuro Re-ed Details  SLS on foam 30 sec x 4 bilateral, tandem stance on foam 1 min x 2 bilateral      Vasopneumatic   Number Minutes Vasopneumatic  10 minutes    Vasopnuematic Location  Ankle    Vasopneumatic Pressure Medium    Vasopneumatic Temperature  34  Ankle Exercises: Aerobic   Recumbent Bike Lvl 3 seat 5 10 mins      Ankle Exercises: Stretches   Gastroc Stretch 60 seconds;3 reps   incline board     Ankle Exercises: Machines for Strengthening   Cybex Leg Press Double leg 100 lbs 2 x 10      Ankle Exercises: Standing   Other Standing Ankle Exercises heel/toe raises 20x                       PT Short Term Goals - 01/12/21 0940       PT SHORT TERM GOAL #1   Title Patient will demonstrate independent use of home exercise program to maintain progress from in clinic treatments.    Time 3    Period Weeks    Status On-going    Target Date 01/23/21               PT Long Term Goals - 01/02/21 0804       PT LONG TERM GOAL #1   Title Patient will demonstrate/report pain at worst less than or equal to 2/10 to facilitate minimal limitation in daily activity secondary to pain symptoms.    Time 10    Period Weeks    Status New    Target Date 03/13/21      PT LONG TERM GOAL #2   Title Patient will  demonstrate independent use of home exercise program to facilitate ability to maintain/progress functional gains from skilled physical therapy services.    Time 10    Period Weeks    Status New    Target Date 03/13/21      PT LONG TERM GOAL #3   Title Pt. will demonstrate FOTO outcome > or = 66% to indicated reduced disability due to condition.    Time 10    Period Weeks    Status New    Target Date 03/13/21      PT LONG TERM GOAL #4   Title Patient will demonstrate Lt leg MMT PF 4/5, eversion 5/5 to facilitate ability to perform usual standing, walking, stairs at PLOF s limitation due to symptoms.    Time 10    Period Weeks    Status New    Target Date 03/13/21      PT LONG TERM GOAL #5   Title Pt. will demonstrate bilateral SLS > 15 seconds to facilitate stability in ambulation.    Time 10    Period Weeks    Status New    Target Date 03/13/21      Additional Long Term Goals   Additional Long Term Goals Yes      PT LONG TERM GOAL #6   Title Pt. will demonstrate Lt DF mobility equal to Rt to facilitate normal mobility in ambulation.    Time 10    Period Weeks    Status New    Target Date 03/13/21                   Plan - 01/16/21 0919     Clinical Impression Statement Edema still noted as complaint and evident as restriction in Lt ankle mobility but making progress in movement coordination to this point. Continued skilled PT services indicated at this time.    Personal Factors and Comorbidities Comorbidity 2    Comorbidities DM, HTN    Stability/Clinical Decision Making Stable/Uncomplicated    Rehab Potential Good    PT Frequency Other (  comment)   1-2x/week   PT Duration Other (comment)   10 weeks   PT Treatment/Interventions ADLs/Self Care Home Management;Cryotherapy;Electrical Stimulation;Iontophoresis 4mg /ml Dexamethasone;Moist Heat;Balance training;Therapeutic exercise;Therapeutic activities;Functional mobility training;Stair training;Gait training;DME  Instruction;Ultrasound;Neuromuscular re-education;Patient/family education;Passive range of motion;Dry needling;Joint Manipulations;Taping;Vasopneumatic Device;Manual techniques    PT Next Visit Plan compliant surface and dynamic balance, progressive strengthening. vaso as desired.    PT Home Exercise Plan [id number]    Consulted and Agree with Plan of Care Patient             Patient will benefit from skilled therapeutic intervention in order to improve the following deficits and impairments:  Decreased endurance, Hypomobility, Decreased activity tolerance, Decreased strength, Pain, Decreased balance, Decreased mobility, Difficulty walking, Impaired perceived functional ability, Improper body mechanics, Impaired flexibility, Decreased coordination, Decreased range of motion  Visit Diagnosis: Pain in left lower leg  Muscle weakness (generalized)  Difficulty in walking, not elsewhere classified  Localized edema     Problem List Patient Active Problem List   Diagnosis Date Noted   Obesity 03/12/2016   Palpitations 07/07/2013   Acute sinusitis 04/21/2013   Obstructive sleep apnea (adult) (pediatric) 08/25/2012   Hypersomnia with sleep apnea, unspecified 08/25/2012   Bilateral lower extremity edema 11/27/2011   Obesity, Class III, BMI 40-49.9 (morbid obesity) ([place]) 10/26/2011   Essential hypertension 11/28/2008   IRRITABLE BOWEL SYNDROME - diarrhea predominant 11/10/2008    Scot Jun, PT, DPT, OCS, ATC 01/16/21  9:28 AM    [facility] [address] [place], [place], [address] Phone: [phone]   Fax:  [phone]  Name: Patricia [name] MRN: [id number] Date of Birth: [birth year]

## 2021-01-20 ENCOUNTER — Telehealth: Payer: Self-pay | Admitting: *Deleted

## 2021-01-20 ENCOUNTER — Ambulatory Visit: Payer: 59 | Admitting: Family Medicine

## 2021-01-20 ENCOUNTER — Other Ambulatory Visit: Payer: Self-pay | Admitting: Family Medicine

## 2021-01-20 ENCOUNTER — Ambulatory Visit: Payer: 59 | Admitting: Rehabilitative and Restorative Service Providers"

## 2021-01-20 ENCOUNTER — Other Ambulatory Visit: Payer: Self-pay

## 2021-01-20 ENCOUNTER — Encounter: Payer: Self-pay | Admitting: Rehabilitative and Restorative Service Providers"

## 2021-01-20 VITALS — BP 134/80 | HR 88 | Temp 97.7°F | Wt 266.0 lb

## 2021-01-20 DIAGNOSIS — R600 Localized edema: Secondary | ICD-10-CM

## 2021-01-20 DIAGNOSIS — M6281 Muscle weakness (generalized): Secondary | ICD-10-CM

## 2021-01-20 DIAGNOSIS — M79662 Pain in left lower leg: Secondary | ICD-10-CM

## 2021-01-20 DIAGNOSIS — R262 Difficulty in walking, not elsewhere classified: Secondary | ICD-10-CM

## 2021-01-20 DIAGNOSIS — I10 Essential (primary) hypertension: Secondary | ICD-10-CM | POA: Diagnosis not present

## 2021-01-20 MED ORDER — ALPRAZOLAM 0.5 MG PO TABS: 0.5000 mg | ORAL_TABLET | Freq: Three times a day (TID) | ORAL | 0 refills | Status: DC | PRN

## 2021-01-20 MED ORDER — SAXENDA 18 MG/3ML ~~LOC~~ SOPN: 0.6000 mg | PEN_INJECTOR | Freq: Every day | SUBCUTANEOUS | 3 refills | Status: DC

## 2021-01-20 NOTE — Therapy (Signed)
[facility] Physical Therapy [address] [place], [place], [address] Phone: [phone]   Fax:  [phone]  Physical Therapy Treatment  Patient Details  Name: Patricia [name] MRN: [id number] Date of Birth: [birth year] Referring Provider (PT): Newt Minion, MD   Encounter Date: 01/20/2021   PT End of Session - 01/20/21 0845     Visit Number 5    Number of Visits 20    Date for PT Re-Evaluation 03/13/21    Authorization Type U[employer] $10 copay    PT Start Time 0845    PT Stop Time 0943    PT Time Calculation (min) 58 min    Activity Tolerance Patient tolerated treatment well    Behavior During Therapy [facility] for tasks assessed/performed             Past Medical History:  Diagnosis Date   Arthritis    "spine-DDD-intermittent sciatic pain"   Chronic cervicitis    resolved   Diabetes mellitus without complication (HCC)    diet controlled   GERD (gastroesophageal reflux disease)    Heart murmur    "benign"-routinely sees yearly - Dr. Deretha Emory, cardiology   History of kidney stones    multiple kidney stones-passed.   Hypertension    IBS (irritable bowel syndrome)    controlled with diet   Menorrhagia    resolved s/p Hysterectomy   Obesity    Sleep apnea    cpap used nightly   Vitamin D deficiency     Past Surgical History:  Procedure Laterality Date   BREAST LUMPECTOMY Left    benign hamartoma   CESAREAN SECTION     x1   COLONOSCOPY  10/19/2005   ENDOMETRIAL ABLATION     ESOPHAGOGASTRODUODENOSCOPY  12/21/2008   HYSTEROSCOPY WITH D & C     resection of endometrial polyp   LAPAROSCOPIC GASTRIC SLEEVE RESECTION N/A 03/12/2016   Procedure: LAPAROSCOPIC GASTRIC SLEEVE RESECTION WITH UPPER ENDO;  Surgeon: Arta Bruce Kinsinger, MD;  Location: [facility];  Service: General;  Laterality: N/A;   TUBAL LIGATION     Uterine Polyps Removed     VAGINAL HYSTERECTOMY     fibroids    There were no vitals filed for this visit.   Subjective  Assessment - 01/20/21 0853     Subjective Pt. indicated feeling well and no pain today.    Limitations Standing;Walking;House hold activities    Diagnostic tests MRI    Patient Stated Goals Reduce pain and return to work.    Currently in Pain? No/denies    Pain Score 0-No pain    Pain Onset More than a month ago                [facility] PT Assessment - 01/20/21 0001       Assessment   Medical Diagnosis S86.012S (ICD-10-CM) - Partial rupture of Achilles tendon, left, sequela    Referring Provider (PT) Newt Minion, MD    Onset Date/Surgical Date 10/24/20    Hand Dominance Right      Strength   Left Ankle Plantar Flexion 3/5                           [facility] Adult PT Treatment/Exercise - 01/20/21 0001       Neuro Re-ed    Neuro Re-ed Details  wobble board fwd/back each way 30 x, side to side 30 x each way, tandem ambulation on foam fwd/back 8 ft x  6 each way      Vasopneumatic   Number Minutes Vasopneumatic  10 minutes    Vasopnuematic Location  Ankle    Vasopneumatic Pressure Medium    Vasopneumatic Temperature  34      Ankle Exercises: Aerobic   Recumbent Bike Lvl 3 seat 5 10 mins      Ankle Exercises: Standing   Other Standing Ankle Exercises step up 6 inch c high knee (SLS control) 2 x 10 bilateral    Other Standing Ankle Exercises eccentric loading PF 3 x 10 (2 up, 2 down)                       PT Short Term Goals - 01/20/21 0947       PT SHORT TERM GOAL #1   Title Patient will demonstrate independent use of home exercise program to maintain progress from in clinic treatments.    Time 3    Period Weeks    Status Achieved    Target Date 01/23/21               PT Long Term Goals - 01/02/21 0804       PT LONG TERM GOAL #1   Title Patient will demonstrate/report pain at worst less than or equal to 2/10 to facilitate minimal limitation in daily activity secondary to pain symptoms.    Time 10    Period Weeks    Status  New    Target Date 03/13/21      PT LONG TERM GOAL #2   Title Patient will demonstrate independent use of home exercise program to facilitate ability to maintain/progress functional gains from skilled physical therapy services.    Time 10    Period Weeks    Status New    Target Date 03/13/21      PT LONG TERM GOAL #3   Title Pt. will demonstrate FOTO outcome > or = 66% to indicated reduced disability due to condition.    Time 10    Period Weeks    Status New    Target Date 03/13/21      PT LONG TERM GOAL #4   Title Patient will demonstrate Lt leg MMT PF 4/5, eversion 5/5 to facilitate ability to perform usual standing, walking, stairs at PLOF s limitation due to symptoms.    Time 10    Period Weeks    Status New    Target Date 03/13/21      PT LONG TERM GOAL #5   Title Pt. will demonstrate bilateral SLS > 15 seconds to facilitate stability in ambulation.    Time 10    Period Weeks    Status New    Target Date 03/13/21      Additional Long Term Goals   Additional Long Term Goals Yes      PT LONG TERM GOAL #6   Title Pt. will demonstrate Lt DF mobility equal to Rt to facilitate normal mobility in ambulation.    Time 10    Period Weeks    Status New    Target Date 03/13/21                   Plan - 01/20/21 0944     Clinical Impression Statement Positive symptom reduction noted to this point.  Continued strength deficits in PF noted on involved side.  Making gains in stability control overall.  Continued skilled PT indicated at this time.  Personal Factors and Comorbidities Comorbidity 2    Comorbidities DM, HTN    Stability/Clinical Decision Making Stable/Uncomplicated    Rehab Potential Good    PT Frequency Other (comment)   1-2x/week   PT Duration Other (comment)   10 weeks   PT Treatment/Interventions ADLs/Self Care Home Management;Cryotherapy;Electrical Stimulation;Iontophoresis 4mg /ml Dexamethasone;Moist Heat;Balance training;Therapeutic  exercise;Therapeutic activities;Functional mobility training;Stair training;Gait training;DME Instruction;Ultrasound;Neuromuscular re-education;Patient/family education;Passive range of motion;Dry needling;Joint Manipulations;Taping;Vasopneumatic Device;Manual techniques    PT Next Visit Plan compliant surface and dynamic balance, progressive strengthening in PF. vaso as desired.    PT Home Exercise Plan [id number]    Consulted and Agree with Plan of Care Patient             Patient will benefit from skilled therapeutic intervention in order to improve the following deficits and impairments:  Decreased endurance, Hypomobility, Decreased activity tolerance, Decreased strength, Pain, Decreased balance, Decreased mobility, Difficulty walking, Impaired perceived functional ability, Improper body mechanics, Impaired flexibility, Decreased coordination, Decreased range of motion  Visit Diagnosis: Pain in left lower leg  Muscle weakness (generalized)  Difficulty in walking, not elsewhere classified  Localized edema     Problem List Patient Active Problem List   Diagnosis Date Noted   Obesity 03/12/2016   Palpitations 07/07/2013   Acute sinusitis 04/21/2013   Obstructive sleep apnea (adult) (pediatric) 08/25/2012   Hypersomnia with sleep apnea, unspecified 08/25/2012   Bilateral lower extremity edema 11/27/2011   Obesity, Class III, BMI 40-49.9 (morbid obesity) ([place]) 10/26/2011   Essential hypertension 11/28/2008   IRRITABLE BOWEL SYNDROME - diarrhea predominant 11/10/2008    Scot Jun, PT, DPT, OCS, ATC 01/20/21  9:49 AM    [facility] Physical Therapy [address] [place], [place], [address] Phone: [phone]   Fax:  [phone]  Name: Patricia [name] MRN: [id number] Date of Birth: [birth year]

## 2021-01-20 NOTE — Progress Notes (Signed)
Subjective:    Patient ID: Patricia [name], female    DOB: 24-Feb-1966, 55 y.o.   MRN: [id number]  HPI Patient is a very sweet 55 year old African-American female who presents today for a checkup.  She is still dealing with the loss of her husband.  Recently her daughter got married and she admits that she was putting her self into the wedding to keep busy.  However its now at downtime.  She is still battling through the grieving process and as result she feels like that is hindered her ability to lose weight.  She is interested in medicines that can help with weight loss.  Her BMI is greater than 40 and she has hypertension and hyperlipidemia so she has medical comorbidities associated with obesity.  Today we discussed [place] and Wegovy.  I recommended Saxenda because of the backorder on Wegovy.  She is interested in trying that.  She is not working at the present time.  She is not volunteering.  I recommended that she find anything to do on a daily basis [employer] or a part-time job just to keep busy per [employer] through the holidays.  Their anniversary is also in November.  She is sleeping pretty good and she does not feel that she is depressed but she would like to have Xanax to use sparingly just in case. Past Medical History:  Diagnosis Date   Arthritis    "spine-DDD-intermittent sciatic pain"   Chronic cervicitis    resolved   Diabetes mellitus without complication (HCC)    diet controlled   GERD (gastroesophageal reflux disease)    Heart murmur    "benign"-routinely sees yearly - Dr. Deretha Emory, cardiology   History of kidney stones    multiple kidney stones-passed.   Hypertension    IBS (irritable bowel syndrome)    controlled with diet   Menorrhagia    resolved s/p Hysterectomy   Obesity    Sleep apnea    cpap used nightly   Vitamin D deficiency    Past Surgical History:  Procedure Laterality Date   BREAST LUMPECTOMY Left    benign hamartoma   CESAREAN  SECTION     x1   COLONOSCOPY  10/19/2005   ENDOMETRIAL ABLATION     ESOPHAGOGASTRODUODENOSCOPY  12/21/2008   HYSTEROSCOPY WITH D & C     resection of endometrial polyp   LAPAROSCOPIC GASTRIC SLEEVE RESECTION N/A 03/12/2016   Procedure: LAPAROSCOPIC GASTRIC SLEEVE RESECTION WITH UPPER ENDO;  Surgeon: Arta Bruce Kinsinger, MD;  Location: [facility];  Service: General;  Laterality: N/A;   TUBAL LIGATION     Uterine Polyps Removed     VAGINAL HYSTERECTOMY     fibroids   Current Outpatient Medications on File Prior to Visit  Medication Sig Dispense Refill   atorvastatin (LIPITOR) 10 MG tablet Take 1 tablet (10 mg total) by mouth daily. 90 tablet 3   fluticasone (FLONASE) 50 MCG/ACT nasal spray Place 2 sprays into both nostrils daily. 16 g 6   ibuprofen (ADVIL) 600 MG tablet Take 600 mg by mouth 3 (three) times daily.     KLOR-CON M20 20 MEQ tablet TAKE 1 TABLET BY MOUTH EVERY DAY 30 tablet 8   losartan-hydrochlorothiazide (HYZAAR) 100-12.5 MG tablet Take 1 tablet by mouth daily. 90 tablet 3   Melatonin 10 MG TABS Take by mouth.     Multiple Vitamin (MULTIVITAMIN) capsule Take 1 capsule by mouth daily.     omeprazole (PRILOSEC) 40 MG capsule TAKE 1  CAPSULE BY MOUTH EVERY DAY 30 capsule 11   Probiotic Product (PROBIOTIC DAILY PO) Take 1 tablet by mouth daily.     traZODone (DESYREL) 50 MG tablet TAKE 1 TABLET BY MOUTH AT BEDTIME AS NEEDED FOR SLEEP. 90 tablet 2   No current facility-administered medications on file prior to visit.   No Known Allergies Social History   Socioeconomic History   Marital status: Widowed    Spouse name: Montine Circle   Number of children: 2   Years of education: 16   Highest education level: Not on file  Occupational History   Occupation: QUALITY ANALYST    Employer: [employer]  Tobacco Use   Smoking status: Never   Smokeless tobacco: Never  Vaping Use   Vaping Use: Never used  Substance and Sexual Activity   Alcohol use: Yes    Alcohol/week: 0.0  standard drinks    Comment: socially   Drug use: No   Sexual activity: Yes    Birth control/protection: Surgical  Other Topics Concern   Not on file  Social History Narrative   Married, college education, 2 children    Teacher, early years/pre for [employer]    patient is right handed and consumes green tea daily   Never smoker no tobacco, no drug use, only rare to occasional alcohol   Social Determinants of Radio broadcast assistant Strain: Not on file  Food Insecurity: Not on file  Transportation Needs: Not on file  Physical Activity: Not on file  Stress: Not on file  Social Connections: Not on file  Intimate Partner Violence: Not on file   Family History  Problem Relation Age of Onset   Hypertension Father    Hypertension Mother    Diabetes Mother    Coronary artery disease Mother    Sudden death Mother    Cancer Brother        carcinoid tumer of the appendix   Colon cancer Neg Hx    Esophageal cancer Neg Hx    Stomach cancer Neg Hx    Rectal cancer Neg Hx       Review of Systems  All other systems reviewed and are negative.     Objective:   Physical Exam Vitals reviewed.  Constitutional:      General: She is not in acute distress.    Appearance: She is well-developed. She is not diaphoretic.  HENT:     Head: Normocephalic and atraumatic.     Right Ear: External ear normal.     Left Ear: External ear normal.     Nose: Nose normal.     Mouth/Throat:     Pharynx: No oropharyngeal exudate.  Eyes:     General: No scleral icterus.       Right eye: No discharge.        Left eye: No discharge.     Conjunctiva/sclera: Conjunctivae normal.     Pupils: Pupils are equal, round, and reactive to light.  Neck:     Thyroid: No thyromegaly.     Vascular: No JVD.     Trachea: No tracheal deviation.  Cardiovascular:     Rate and Rhythm: Normal rate and regular rhythm.     Heart sounds: Normal heart sounds. No murmur heard.   No friction rub. No gallop.   Pulmonary:     Effort: Pulmonary effort is normal. No respiratory distress.     Breath sounds: Normal breath sounds. No stridor. No wheezing or rales.  Chest:  Chest wall: No tenderness.  Abdominal:     General: Bowel sounds are normal. There is no distension.     Palpations: Abdomen is soft. There is no mass.     Tenderness: There is no abdominal tenderness. There is no guarding or rebound.     Hernia: No hernia is present.  Musculoskeletal:     Cervical back: Normal range of motion and neck supple.  Lymphadenopathy:     Cervical: No cervical adenopathy.  Skin:    General: Skin is warm.     Coloration: Skin is not pale.     Findings: No erythema or rash.  Neurological:     Mental Status: She is alert and oriented to person, place, and time.     Cranial Nerves: No cranial nerve deficit.     Sensory: No sensory deficit.     Motor: No abnormal muscle tone.     Coordination: Coordination normal.     Deep Tendon Reflexes: Reflexes normal.  Psychiatric:        Behavior: Behavior normal.        Thought Content: Thought content normal.          Assessment & Plan:  Benign essential HTN - Plan: CBC with Differential/Platelet, COMPLETE METABOLIC PANEL WITH GFR, Lipid panel  Obesity, Class III, BMI 40-49.9 (morbid obesity) ([place]) Immunizations are up-to-date.  Cancer screening is up-to-date.  Her mammogram was performed through her gynecologist.  I will check a CBC CMP lipid panel today.  Goal LDL cholesterol is less than 100.  I recommended trying Saxenda 0.6 mg subcu daily and increasing on a weekly basis by an increment of 0.6 mg as tolerated up to a maximum of 3 mg a day.  I will give the patient Xanax to use sparingly for breakthrough anxiety or insomnia.  Also encouraged volunteering or work as a means to keep busy during the holiday season.

## 2021-01-20 NOTE — Telephone Encounter (Signed)
Received request from pharmacy for [place] on Saxenda.  PA submitted.   Dx: E66.09- obesity  01/20/2021 Weight 266lbs Height 61" BMI: 50.26  [facility] is reviewing your PA request. Typically an electronic response will be received within 24-72 hours.

## 2021-01-23 ENCOUNTER — Other Ambulatory Visit: Payer: Self-pay

## 2021-01-23 ENCOUNTER — Ambulatory Visit: Payer: 59 | Admitting: Rehabilitative and Restorative Service Providers"

## 2021-01-23 ENCOUNTER — Encounter: Payer: Self-pay | Admitting: Rehabilitative and Restorative Service Providers"

## 2021-01-23 DIAGNOSIS — M79662 Pain in left lower leg: Secondary | ICD-10-CM

## 2021-01-23 DIAGNOSIS — R600 Localized edema: Secondary | ICD-10-CM | POA: Diagnosis not present

## 2021-01-23 DIAGNOSIS — M6281 Muscle weakness (generalized): Secondary | ICD-10-CM | POA: Diagnosis not present

## 2021-01-23 DIAGNOSIS — R262 Difficulty in walking, not elsewhere classified: Secondary | ICD-10-CM

## 2021-01-23 NOTE — Telephone Encounter (Signed)
Received PA determination.   PA denied.   The requested medication and/or diagnosis are not a covered benefit and excluded from coverage in accordance with the terms and conditions of your plan benefit. Therefore, the request has been administratively denied.  Weight loss medications are not covered by insurance.   Please advise.

## 2021-01-23 NOTE — Telephone Encounter (Signed)
Call placed to patient. LMTRC.  

## 2021-01-23 NOTE — Therapy (Signed)
[facility] Physical Therapy [address] [place], [place], [address] Phone: [phone]   Fax:  [phone]  Physical Therapy Treatment  Patient Details  Name: Patricia [name] MRN: [id number] Date of Birth: [birth year] Referring Provider (PT): Newt Minion, MD   Encounter Date: 01/23/2021   PT End of Session - 01/23/21 0802     Visit Number 6    Number of Visits 20    Date for PT Re-Evaluation 03/13/21    Authorization Type UHC $10 copay    PT Start Time 0759    PT Stop Time 0840    PT Time Calculation (min) 41 min    Activity Tolerance Patient tolerated treatment well    Behavior During Therapy WFL for tasks assessed/performed             Past Medical History:  Diagnosis Date   Arthritis    "spine-DDD-intermittent sciatic pain"   Chronic cervicitis    resolved   Diabetes mellitus without complication (HCC)    diet controlled   GERD (gastroesophageal reflux disease)    Heart murmur    "benign"-routinely sees yearly - Dr. Deretha Emory, cardiology   History of kidney stones    multiple kidney stones-passed.   Hypertension    IBS (irritable bowel syndrome)    controlled with diet   Menorrhagia    resolved s/p Hysterectomy   Obesity    Sleep apnea    cpap used nightly   Vitamin D deficiency     Past Surgical History:  Procedure Laterality Date   BREAST LUMPECTOMY Left    benign hamartoma   CESAREAN SECTION     x1   COLONOSCOPY  10/19/2005   ENDOMETRIAL ABLATION     ESOPHAGOGASTRODUODENOSCOPY  12/21/2008   HYSTEROSCOPY WITH D & C     resection of endometrial polyp   LAPAROSCOPIC GASTRIC SLEEVE RESECTION N/A 03/12/2016   Procedure: LAPAROSCOPIC GASTRIC SLEEVE RESECTION WITH UPPER ENDO;  Surgeon: Arta Bruce Kinsinger, MD;  Location: [facility];  Service: General;  Laterality: N/A;   TUBAL LIGATION     Uterine Polyps Removed     VAGINAL HYSTERECTOMY     fibroids    There were no vitals filed for this visit.   Subjective  Assessment - 01/23/21 0803     Subjective Pt. stated feeling tightness after standing a lot but not hurting.    Limitations Standing;Walking;House hold activities    Diagnostic tests MRI    Patient Stated Goals Reduce pain and return to work.    Currently in Pain? No/denies    Pain Score 0-No pain    Pain Onset More than a month ago                               [facility] Adult PT Treatment/Exercise - 01/23/21 0001       Neuro Re-ed    Neuro Re-ed Details  SLS c golf putting x 5 on Lt leg, wobble board fwd/back control 30x, lateral stepping 3 cones x 5 each bilateral      Ankle Exercises: Aerobic   Recumbent Bike Lvl 3 seat 5 10 mins      Ankle Exercises: Machines for Strengthening   Cybex Leg Press Double leg 125 lbs 2 x 10, single leg 62 lbs 2 x 10 bilateral      Ankle Exercises: Stretches   Gastroc Stretch 60 seconds;2 reps   incline board  Ankle Exercises: Standing   Heel Walk (Round Trip) 10 ft x 5    Toe Walk (Round Trip) 10 ft x 5    Other Standing Ankle Exercises eccentric loading PF 3 x 10 (2 up, weight shift to Lt)                       PT Short Term Goals - 01/20/21 0947       PT SHORT TERM GOAL #1   Title Patient will demonstrate independent use of home exercise program to maintain progress from in clinic treatments.    Time 3    Period Weeks    Status Achieved    Target Date 01/23/21               PT Long Term Goals - 01/02/21 0804       PT LONG TERM GOAL #1   Title Patient will demonstrate/report pain at worst less than or equal to 2/10 to facilitate minimal limitation in daily activity secondary to pain symptoms.    Time 10    Period Weeks    Status New    Target Date 03/13/21      PT LONG TERM GOAL #2   Title Patient will demonstrate independent use of home exercise program to facilitate ability to maintain/progress functional gains from skilled physical therapy services.    Time 10    Period Weeks     Status New    Target Date 03/13/21      PT LONG TERM GOAL #3   Title Pt. will demonstrate FOTO outcome > or = 66% to indicated reduced disability due to condition.    Time 10    Period Weeks    Status New    Target Date 03/13/21      PT LONG TERM GOAL #4   Title Patient will demonstrate Lt leg MMT PF 4/5, eversion 5/5 to facilitate ability to perform usual standing, walking, stairs at PLOF s limitation due to symptoms.    Time 10    Period Weeks    Status New    Target Date 03/13/21      PT LONG TERM GOAL #5   Title Pt. will demonstrate bilateral SLS > 15 seconds to facilitate stability in ambulation.    Time 10    Period Weeks    Status New    Target Date 03/13/21      Additional Long Term Goals   Additional Long Term Goals Yes      PT LONG TERM GOAL #6   Title Pt. will demonstrate Lt DF mobility equal to Rt to facilitate normal mobility in ambulation.    Time 10    Period Weeks    Status New    Target Date 03/13/21                   Plan - 01/23/21 0828     Clinical Impression Statement Lt PF strength needs contiued improvement to gain towards PLOF.  Continued skilled PT services indicated to help continue progression.    Personal Factors and Comorbidities Comorbidity 2    Comorbidities DM, HTN    Stability/Clinical Decision Making Stable/Uncomplicated    Rehab Potential Good    PT Frequency Other (comment)   1-2x/week   PT Duration Other (comment)   10 weeks   PT Treatment/Interventions ADLs/Self Care Home Management;Cryotherapy;Electrical Stimulation;Iontophoresis 4mg /ml Dexamethasone;Moist Heat;Balance training;Therapeutic exercise;Therapeutic activities;Functional mobility training;Stair training;Gait training;DME Instruction;Ultrasound;Neuromuscular re-education;Patient/family education;Passive range  of motion;Dry needling;Joint Manipulations;Taping;Vasopneumatic Device;Manual techniques    PT Next Visit Plan PF strengthening, dynamic stability  intervention. vaso as desired.    PT Home Exercise Plan [id number]    Consulted and Agree with Plan of Care Patient             Patient will benefit from skilled therapeutic intervention in order to improve the following deficits and impairments:  Decreased endurance, Hypomobility, Decreased activity tolerance, Decreased strength, Pain, Decreased balance, Decreased mobility, Difficulty walking, Impaired perceived functional ability, Improper body mechanics, Impaired flexibility, Decreased coordination, Decreased range of motion  Visit Diagnosis: Pain in left lower leg  Muscle weakness (generalized)  Difficulty in walking, not elsewhere classified  Localized edema     Problem List Patient Active Problem List   Diagnosis Date Noted   Obesity 03/12/2016   Palpitations 07/07/2013   Acute sinusitis 04/21/2013   Obstructive sleep apnea (adult) (pediatric) 08/25/2012   Hypersomnia with sleep apnea, unspecified 08/25/2012   Bilateral lower extremity edema 11/27/2011   Obesity, Class III, BMI 40-49.9 (morbid obesity) ([place]) 10/26/2011   Essential hypertension 11/28/2008   IRRITABLE BOWEL SYNDROME - diarrhea predominant 11/10/2008    Scot Jun, PT, DPT, OCS, ATC 01/23/21  8:36 AM    [facility] Physical Therapy [address] [place], [place], [address] Phone: [phone]   Fax:  [phone]  Name: Patricia [name] MRN: [id number] Date of Birth: 1965/06/19

## 2021-01-25 ENCOUNTER — Encounter: Payer: 59 | Admitting: Physical Therapy

## 2021-01-25 NOTE — Telephone Encounter (Signed)
Call placed to patient and patient made aware.  

## 2021-01-27 ENCOUNTER — Other Ambulatory Visit: Payer: Self-pay

## 2021-01-27 ENCOUNTER — Other Ambulatory Visit: Payer: 59

## 2021-01-27 LAB — CBC WITH DIFFERENTIAL/PLATELET
Absolute Monocytes: 563 cells/uL (ref 200–950)
Basophils Absolute: 29 cells/uL (ref 0–200)
Basophils Relative: 0.5 %
Eosinophils Absolute: 41 cells/uL (ref 15–500)
Eosinophils Relative: 0.7 %
HCT: 41.4 % (ref 35.0–45.0)
Hemoglobin: 13.9 g/dL (ref 11.7–15.5)
Lymphs Abs: 2500 cells/uL (ref 850–3900)
MCH: 29.2 pg (ref 27.0–33.0)
MCHC: 33.6 g/dL (ref 32.0–36.0)
MCV: 87 fL (ref 80.0–100.0)
MPV: 10.1 fL (ref 7.5–12.5)
Monocytes Relative: 9.7 %
Neutro Abs: 2668 cells/uL (ref 1500–7800)
Neutrophils Relative %: 46 %
Platelets: 263 10*3/uL (ref 140–400)
RBC: 4.76 10*6/uL (ref 3.80–5.10)
RDW: 13 % (ref 11.0–15.0)
Total Lymphocyte: 43.1 %
WBC: 5.8 10*3/uL (ref 3.8–10.8)

## 2021-01-27 LAB — LIPID PANEL
Cholesterol: 180 mg/dL (ref <200)
HDL: 70 mg/dL (ref >=50)
LDL Cholesterol (Calc): 90 mg/dL (calc)
Non-HDL Cholesterol (Calc): 110 mg/dL (calc) (ref <130)
Total CHOL/HDL Ratio: 2.6 (calc) (ref <5.0)
Triglycerides: 104 mg/dL (ref <150)

## 2021-01-27 LAB — COMPLETE METABOLIC PANEL WITH GFR
AG Ratio: 1.4 (calc) (ref 1.0–2.5)
ALT: 14 U/L (ref 6–29)
AST: 17 U/L (ref 10–35)
Albumin: 4 g/dL (ref 3.6–5.1)
Alkaline phosphatase (APISO): 78 U/L (ref 37–153)
BUN: 17 mg/dL (ref 7–25)
CO2: 26 mmol/L (ref 20–32)
Calcium: 9.7 mg/dL (ref 8.6–10.4)
Chloride: 103 mmol/L (ref 98–110)
Creat: 0.87 mg/dL (ref 0.50–1.03)
Globulin: 2.8 g/dL (calc) (ref 1.9–3.7)
Glucose, Bld: 80 mg/dL (ref 65–99)
Potassium: 3.7 mmol/L (ref 3.5–5.3)
Sodium: 139 mmol/L (ref 135–146)
Total Bilirubin: 0.4 mg/dL (ref 0.2–1.2)
Total Protein: 6.8 g/dL (ref 6.1–8.1)
eGFR: 79 mL/min/{1.73_m2} (ref >=60)

## 2021-01-30 ENCOUNTER — Ambulatory Visit: Payer: 59 | Admitting: Rehabilitative and Restorative Service Providers"

## 2021-01-30 ENCOUNTER — Encounter: Payer: Self-pay | Admitting: Rehabilitative and Restorative Service Providers"

## 2021-01-30 ENCOUNTER — Other Ambulatory Visit: Payer: Self-pay

## 2021-01-30 DIAGNOSIS — R262 Difficulty in walking, not elsewhere classified: Secondary | ICD-10-CM

## 2021-01-30 DIAGNOSIS — M6281 Muscle weakness (generalized): Secondary | ICD-10-CM | POA: Diagnosis not present

## 2021-01-30 DIAGNOSIS — M79662 Pain in left lower leg: Secondary | ICD-10-CM | POA: Diagnosis not present

## 2021-01-30 DIAGNOSIS — R600 Localized edema: Secondary | ICD-10-CM

## 2021-01-30 NOTE — Therapy (Addendum)
[facility] Physical Therapy [address] [place], [place], [address] Phone: [phone]   Fax:  [phone]  Physical Therapy Treatment /Discharge   Patient Details  Name: Patricia [name] MRN: [id number] Date of Birth: 28-Feb-1966 Referring Provider (PT): Newt Minion, MD   Encounter Date: 01/30/2021   PT End of Session - 01/30/21 0757     Visit Number 7    Number of Visits 20    Date for PT Re-Evaluation 03/13/21    Authorization Type [employer] $10 copay    PT Start Time 0758    PT Stop Time 0838    PT Time Calculation (min) 40 min    Activity Tolerance Patient tolerated treatment well    Behavior During Therapy WFL for tasks assessed/performed             Past Medical History:  Diagnosis Date   Arthritis    "spine-DDD-intermittent sciatic pain"   Chronic cervicitis    resolved   Diabetes mellitus without complication (HCC)    diet controlled   GERD (gastroesophageal reflux disease)    Heart murmur    "benign"-routinely sees yearly - Dr. Deretha Emory, cardiology   History of kidney stones    multiple kidney stones-passed.   Hypertension    IBS (irritable bowel syndrome)    controlled with diet   Menorrhagia    resolved s/p Hysterectomy   Obesity    Sleep apnea    cpap used nightly   Vitamin D deficiency     Past Surgical History:  Procedure Laterality Date   BREAST LUMPECTOMY Left    benign hamartoma   CESAREAN SECTION     x1   COLONOSCOPY  10/19/2005   ENDOMETRIAL ABLATION     ESOPHAGOGASTRODUODENOSCOPY  12/21/2008   HYSTEROSCOPY WITH D & C     resection of endometrial polyp   LAPAROSCOPIC GASTRIC SLEEVE RESECTION N/A 03/12/2016   Procedure: LAPAROSCOPIC GASTRIC SLEEVE RESECTION WITH UPPER ENDO;  Surgeon: Arta Bruce Kinsinger, MD;  Location: [facility];  Service: General;  Laterality: N/A;   TUBAL LIGATION     Uterine Polyps Removed     VAGINAL HYSTERECTOMY     fibroids    There were no vitals filed for this visit.    Subjective Assessment - 01/30/21 0802     Subjective Pt. stated no pain today upon arrival.  Wore a little heel to church.  Pt. indicated overall improvement about 75-80% range at this time.    Limitations Standing;Walking;House hold activities    Diagnostic tests MRI    Patient Stated Goals Reduce pain and return to work.    Currently in Pain? No/denies    Pain Score 0-No pain    Pain Onset More than a month ago                [facility] PT Assessment - 01/30/21 0001       Observation/Other Assessments   Focus on Therapeutic Outcomes (FOTO)  updated 72%                           [facility] Adult PT Treatment/Exercise - 01/30/21 0001       Neuro Re-ed    Neuro Re-ed Details  SLS on foam 30 sec x 4 bilateral occasional hand assist, slider fwd/side/back x 10 bilateral      Ankle Exercises: Aerobic   Recumbent Bike Lvl 3 seat 5 10 mins      Ankle Exercises: Machines  for Strengthening   Cybex Leg Press Double leg 125 2 x 15, single leg 62 lbs 2 x 10 bilateral      Ankle Exercises: Standing   Other Standing Ankle Exercises lateral step down 6 inch 2 x 15 bilateral    Other Standing Ankle Exercises eccentric loading PF 3 x 10 (2 up, weight shift to Lt)                       PT Short Term Goals - 01/20/21 0947       PT SHORT TERM GOAL #1   Title Patient will demonstrate independent use of home exercise program to maintain progress from in clinic treatments.    Time 3    Period Weeks    Status Achieved    Target Date 01/23/21               PT Long Term Goals - 01/30/21 0826       PT LONG TERM GOAL #1   Title Patient will demonstrate/report pain at worst less than or equal to 2/10 to facilitate minimal limitation in daily activity secondary to pain symptoms.    Time 10    Period Weeks    Status On-going    Target Date 03/13/21      PT LONG TERM GOAL #2   Title Patient will demonstrate independent use of home exercise program to  facilitate ability to maintain/progress functional gains from skilled physical therapy services.    Time 10    Period Weeks    Status On-going    Target Date 03/13/21      PT LONG TERM GOAL #3   Title Pt. will demonstrate FOTO outcome > or = 66% to indicated reduced disability due to condition.    Time 10    Period Weeks    Status Achieved      PT LONG TERM GOAL #4   Title Patient will demonstrate Lt leg MMT PF 4/5, eversion 5/5 to facilitate ability to perform usual standing, walking, stairs at PLOF s limitation due to symptoms.    Time 10    Period Weeks    Status On-going    Target Date 03/13/21      PT LONG TERM GOAL #5   Title Pt. will demonstrate bilateral SLS > 15 seconds to facilitate stability in ambulation.    Time 10    Period Weeks    Status On-going    Target Date 03/13/21      PT LONG TERM GOAL #6   Title Pt. will demonstrate Lt DF mobility equal to Rt to facilitate normal mobility in ambulation.    Time 10    Period Weeks    Status On-going    Target Date 03/13/21                01/30/21 0836  Plan  Clinical Impression Statement Continued improvement noted as measured in FOTO reassessment and Pt. report.  Pt. appropriate for reduced frequency of visits to encourage HEP use.  Personal Factors and Comorbidities Comorbidity 2  Comorbidities DM, HTN  Pt will benefit from skilled therapeutic intervention in order to improve on the following deficits Decreased endurance;Hypomobility;Decreased activity tolerance;Decreased strength;Pain;Decreased balance;Decreased mobility;Difficulty walking;Impaired perceived functional ability;Improper body mechanics;Impaired flexibility;Decreased coordination;Decreased range of motion  Stability/Clinical Decision Making Stable/Uncomplicated  Rehab Potential Good  PT Frequency Other (comment) (1-2x/week)  PT Duration Other (comment) (10 weeks)  PT Treatment/Interventions ADLs/Self Care Home  Management;Cryotherapy;Electrical Stimulation;Iontophoresis  38m/ml Dexamethasone;Moist Heat;Balance training;Therapeutic exercise;Therapeutic activities;Functional mobility training;Stair training;Gait training;DME Instruction;Ultrasound;Neuromuscular re-education;Patient/family education;Passive range of motion;Dry needling;Joint Manipulations;Taping;Vasopneumatic Device;Manual techniques  PT Next Visit Plan Possible d/c to HEP in next visit or two  PT [place] Agree with Plan of Care Patient        Patient will benefit from skilled therapeutic intervention in order to improve the following deficits and impairments:     Visit Diagnosis: Pain in left lower leg  Muscle weakness (generalized)  Difficulty in walking, not elsewhere classified  Localized edema     Problem List Patient Active Problem List   Diagnosis Date Noted   Obesity 03/12/2016   Palpitations 07/07/2013   Acute sinusitis 04/21/2013   Obstructive sleep apnea (adult) (pediatric) 08/25/2012   Hypersomnia with sleep apnea, unspecified 08/25/2012   Bilateral lower extremity edema 11/27/2011   Obesity, Class III, BMI 40-49.9 (morbid obesity) ([place] 10/26/2011   Essential hypertension 11/28/2008   IRRITABLE BOWEL SYNDROME - diarrhea predominant 11/10/2008   MScot Jun PT, DPT, OCS, ATC 01/30/21  8:36 AM  Updated to include plan documentation - MScot Jun PT, DPT, OCS, ATC 02/08/21  8:19 AM   PHYSICAL THERAPY DISCHARGE SUMMARY  Visits from Start of Care: 7  Current functional level related to goals / functional outcomes: See note   Remaining deficits: See note   Education / Equipment: HEP    Patient agrees to discharge. Patient goals were partially met. Patient is being discharged due to being pleased with the current functional level.  MScot Jun PT, DPT, OCS, ATC 03/06/21  10:27 AM     [facility] [address] [address]Phone: [phone]  Fax:  [phone] Name: KBRENT NOTOMRN: [id number] of Birth: 1August 10, 1967

## 2021-02-01 ENCOUNTER — Encounter: Payer: 59 | Admitting: Rehabilitative and Restorative Service Providers"

## 2021-02-06 ENCOUNTER — Encounter: Payer: 59 | Admitting: Physical Therapy

## 2021-02-08 ENCOUNTER — Encounter: Payer: 59 | Admitting: Rehabilitative and Restorative Service Providers"

## 2021-02-08 ENCOUNTER — Telehealth: Payer: Self-pay | Admitting: Rehabilitative and Restorative Service Providers"

## 2021-02-08 NOTE — Telephone Encounter (Signed)
Pt. Indicated car trouble when called after 20 mins no show for appointment.  Indicated she will call to reschedule.   Scot Jun, PT, DPT, OCS, ATC 02/08/21  8:21 AM

## 2021-02-13 ENCOUNTER — Encounter: Payer: 59 | Admitting: Rehabilitative and Restorative Service Providers"

## 2021-02-13 ENCOUNTER — Other Ambulatory Visit: Payer: Self-pay | Admitting: Physician Assistant

## 2021-02-15 ENCOUNTER — Encounter: Payer: 59 | Admitting: Physical Therapy

## 2021-02-19 ENCOUNTER — Encounter: Payer: Self-pay | Admitting: Family Medicine

## 2021-02-20 NOTE — Telephone Encounter (Signed)
Please advise, thanks.

## 2021-03-17 ENCOUNTER — Other Ambulatory Visit: Payer: Self-pay | Admitting: Physician Assistant

## 2021-04-06 ENCOUNTER — Other Ambulatory Visit: Payer: Self-pay | Admitting: Physician Assistant

## 2021-05-18 ENCOUNTER — Telehealth: Payer: Self-pay | Admitting: Physician Assistant

## 2021-05-18 NOTE — Telephone Encounter (Signed)
° ° °*  STAT* If patient is at the pharmacy, call can be transferred to refill team.   1. Which medications need to be refilled? (please list name of each medication and dose if known)  atorvastatin (LIPITOR) 10 MG tablet  2. Which pharmacy/location (including street and city if local pharmacy) is medication to be sent to? [id number] [place], [place] - [address]  3. Do they need a 30 day or 90 day supply? 90 days

## 2021-05-19 MED ORDER — ATORVASTATIN CALCIUM 10 MG PO TABS: ORAL_TABLET | ORAL | 1 refills | Status: DC

## 2021-05-19 NOTE — Telephone Encounter (Signed)
Pt has appt with Gerrianne Scale, PA-C on 5/9. Pt to keep appt for future refills.

## 2021-05-22 ENCOUNTER — Ambulatory Visit: Payer: 59 | Admitting: Orthopedic Surgery

## 2021-05-22 ENCOUNTER — Encounter: Payer: Self-pay | Admitting: Orthopedic Surgery

## 2021-05-22 DIAGNOSIS — S86012S Strain of left Achilles tendon, sequela: Secondary | ICD-10-CM

## 2021-05-22 DIAGNOSIS — M25572 Pain in left ankle and joints of left foot: Secondary | ICD-10-CM | POA: Diagnosis not present

## 2021-05-27 ENCOUNTER — Other Ambulatory Visit: Payer: Self-pay | Admitting: Family Medicine

## 2021-06-02 ENCOUNTER — Encounter: Payer: Self-pay | Admitting: Orthopedic Surgery

## 2021-06-02 NOTE — Progress Notes (Signed)
Office Visit Note   Patient: Patricia [name]           Date of Birth: [birth year]           MRN: [id number] Visit Date: 05/22/2021              Requested by: Susy Frizzle, MD [address] [place],  [place] [address] PCP: Susy Frizzle, MD  Chief Complaint  Patient presents with   Right Ankle - Follow-up      HPI: Patient is a 56 year old woman who presents in follow-up for partial tear of the left Achilles tendon.  She is currently in regular shoewear she has been doing physical therapy for strengthening.  Assessment & Plan: Visit Diagnoses:  1. Partial rupture of Achilles tendon, left, sequela     Plan: Recommended continuing with stretching and strengthening.  Continue to increase her activities as tolerated.  Follow-Up Instructions: Return if symptoms worsen or fail to improve.   Ortho Exam  Patient is alert, oriented, no adenopathy, well-dressed, normal affect, normal respiratory effort. Examination patient has good pulses she has pain at the insertion of the Achilles and the origin of the plantar fascia.  She has dorsiflexion 10 degrees short of neutral with some Achilles tightness.  Palpation over the partial tear is nontender to palpation compression of the calf reproduces plantarflexion of the foot.  There is no palpable defect.  Imaging: No results found. No images are attached to the encounter.  Labs: Lab Results  Component Value Date   HGBA1C 5.7 (H) 07/07/2020   HGBA1C 5.7 (H) 06/30/2019   HGBA1C 5.8 (H) 11/11/2017   ESRSEDRATE 38 (H) 01/05/2020   ESRSEDRATE 61 Repeated and verified X2. (H) 08/31/2019   CRP 33.6 (H) 01/05/2020   CRP 3.7 08/31/2019   LABURIC 5.6 11/12/2011   LABORGA No Salmonella,Shigella,Campylobacter,Yersinia,or 06/10/2012   LABORGA No E.coli 0157:H7 isolated. 06/10/2012   LABORGA NO GROWTH 06/10/2012     Lab Results  Component Value Date   ALBUMIN 4.1 11/11/2017   ALBUMIN 4.0 03/13/2016   ALBUMIN 4.4 03/06/2016     No results found for: MG Lab Results  Component Value Date   VD25OH 40.0 11/11/2017    No results found for: PREALBUMIN CBC EXTENDED Latest Ref Rng & Units 01/20/2021 07/07/2020 01/05/2020  WBC 3.8 - 10.8 Thousand/uL 5.8 4.5 4.2  RBC 3.80 - 5.10 Million/uL 4.76 4.71 4.70  HGB 11.7 - 15.5 g/dL 13.9 13.6 13.7  HCT 35.0 - 45.0 % 41.4 41.2 41.4  PLT 140 - 400 Thousand/uL 263 229 245  NEUTROABS 1,500 - 7,800 cells/uL 2,668 2,340 2,155  LYMPHSABS 850 - 3,900 cells/uL 2,500 1,625 1,567     There is no height or weight on file to calculate BMI.  Orders:  No orders of the defined types were placed in this encounter.  No orders of the defined types were placed in this encounter.    Procedures: No procedures performed  Clinical Data: No additional findings.  ROS:  All other systems negative, except as noted in the HPI. Review of Systems  Objective: Vital Signs: There were no vitals taken for this visit.  Specialty Comments:  No specialty comments available.  PMFS History: Patient Active Problem List   Diagnosis Date Noted   Obesity 03/12/2016   Palpitations 07/07/2013   Acute sinusitis 04/21/2013   Obstructive sleep apnea (adult) (pediatric) 08/25/2012   Hypersomnia with sleep apnea, unspecified 08/25/2012   Bilateral lower extremity edema 11/27/2011  Obesity, Class III, BMI 40-49.9 (morbid obesity) ([place]) 10/26/2011   Essential hypertension 11/28/2008   IRRITABLE BOWEL SYNDROME - diarrhea predominant 11/10/2008   Past Medical History:  Diagnosis Date   Arthritis    "spine-DDD-intermittent sciatic pain"   Chronic cervicitis    resolved   Diabetes mellitus without complication (HCC)    diet controlled   GERD (gastroesophageal reflux disease)    Heart murmur    "benign"-routinely sees yearly - Dr. Deretha Emory, cardiology   History of kidney stones    multiple kidney stones-passed.   Hypertension    IBS (irritable bowel syndrome)    controlled with  diet   Menorrhagia    resolved s/p Hysterectomy   Obesity    Sleep apnea    cpap used nightly   Vitamin D deficiency     Family History  Problem Relation Age of Onset   Hypertension Father    Hypertension Mother    Diabetes Mother    Coronary artery disease Mother    Sudden death Mother    Cancer Brother        carcinoid tumer of the appendix   Colon cancer Neg Hx    Esophageal cancer Neg Hx    Stomach cancer Neg Hx    Rectal cancer Neg Hx     Past Surgical History:  Procedure Laterality Date   BREAST LUMPECTOMY Left    benign hamartoma   CESAREAN SECTION     x1   COLONOSCOPY  10/19/2005   ENDOMETRIAL ABLATION     ESOPHAGOGASTRODUODENOSCOPY  12/21/2008   HYSTEROSCOPY WITH D & C     resection of endometrial polyp   LAPAROSCOPIC GASTRIC SLEEVE RESECTION N/A 03/12/2016   Procedure: LAPAROSCOPIC GASTRIC SLEEVE RESECTION WITH UPPER ENDO;  Surgeon: Arta Bruce Kinsinger, MD;  Location: [facility];  Service: General;  Laterality: N/A;   TUBAL LIGATION     Uterine Polyps Removed     VAGINAL HYSTERECTOMY     fibroids   Social History   Occupational History   Occupation: QUALITY ANALYST    Employer: [employer]  Tobacco Use   Smoking status: Never   Smokeless tobacco: Never  Vaping Use   Vaping Use: Never used  Substance and Sexual Activity   Alcohol use: Yes    Alcohol/week: 0.0 standard drinks    Comment: socially   Drug use: No   Sexual activity: Yes    Birth control/protection: Surgical

## 2021-06-10 ENCOUNTER — Other Ambulatory Visit: Payer: Self-pay | Admitting: Cardiology

## 2021-07-09 ENCOUNTER — Encounter: Payer: Self-pay | Admitting: Family Medicine

## 2021-07-09 ENCOUNTER — Other Ambulatory Visit: Payer: Self-pay | Admitting: Cardiology

## 2021-07-10 ENCOUNTER — Other Ambulatory Visit: Payer: Self-pay | Admitting: Family Medicine

## 2021-07-10 MED ORDER — FLUCONAZOLE 150 MG PO TABS: 150.0000 mg | ORAL_TABLET | Freq: Once | ORAL | 0 refills | Status: AC

## 2021-07-24 NOTE — Progress Notes (Signed)
? ?Cardiology Office Note   ? ?Date:  08/01/2021  ? ?ID:  Patricia [name], DOB 1965-09-23, MRN [id number] ? ? ?PCP:  Susy Frizzle, MD ?  ?[place]  ?Cardiologist:  None   ?Advanced Practice Provider:  No care team member to display ?Electrophysiologist:  None  ? ?[id number]}  ? ?No chief complaint on file. ? ? ?History of Present Illness:  ?Patricia [name] is a 56 y.o. female with history of palpitations no arrhythmias on monitor in 2015, history of dyspnea with normal echo in 2017, symptoms improved with weight loss and bariatric surgery, obesity, OSA on CPAP.  ? ?I saw the patient 01/26/2020 at which time she was under a lot of stress and having some chest pain.  She has a strong family history with the mother who died at 77 of an MI grandmother died at 25 of an MI.  I ordered coronary CTA and calcium score was 0 she had mild nonobstructive CAD with 25 to 49%.  Risk factor modification recommended. ? ?Patient comes in for f/u. Denies chest pain, shortness, swelling, dizziness. Walking 2 1/2 miles 3 times/week. Using CPAP. Lost 60 lbs with bariatric surgery but gained 40 lbs in the past 18 months after her husband died suddenly of pancreatic cancer.  ? ? ? ?Past Medical History:  ?Diagnosis Date  ? Arthritis   ? "spine-DDD-intermittent sciatic pain"  ? Chronic cervicitis   ? resolved  ? Diabetes mellitus without complication ([place])   ? diet controlled  ? GERD (gastroesophageal reflux disease)   ? Heart murmur   ? "benign"-routinely sees yearly - Dr. Deretha Emory, cardiology  ? History of kidney stones   ? multiple kidney stones-passed.  ? Hypertension   ? IBS (irritable bowel syndrome)   ? controlled with diet  ? Menorrhagia   ? resolved s/p Hysterectomy  ? Obesity   ? Sleep apnea   ? cpap used nightly  ? Vitamin D deficiency   ? ? ?Past Surgical History:  ?Procedure Laterality Date  ? BREAST LUMPECTOMY Left   ? benign hamartoma  ? CESAREAN SECTION    ? x1  ? COLONOSCOPY  10/19/2005  ?  ENDOMETRIAL ABLATION    ? ESOPHAGOGASTRODUODENOSCOPY  12/21/2008  ? HYSTEROSCOPY WITH D & C    ? resection of endometrial polyp  ? LAPAROSCOPIC GASTRIC SLEEVE RESECTION N/A 03/12/2016  ? Procedure: LAPAROSCOPIC GASTRIC SLEEVE RESECTION WITH UPPER ENDO;  Surgeon: Arta Bruce Kinsinger, MD;  Location: [facility];  Service: General;  Laterality: N/A;  ? TUBAL LIGATION    ? Uterine Polyps Removed    ? VAGINAL HYSTERECTOMY    ? fibroids  ? ? ?Current Medications: ?Current Meds  ?Medication Sig  ? ALPRAZolam (XANAX) 0.5 MG tablet Take 1 tablet (0.5 mg total) by mouth 3 (three) times daily as needed.  ? atorvastatin (LIPITOR) 10 MG tablet TAKE 1 TABLET BY MOUTH DAILY. PLEASE SCHEDULE APPOINTMENT FOR FUTURE REFILLS. 2ND ATTEMPT  ? fluticasone (FLONASE) 50 MCG/ACT nasal spray Place 2 sprays into both nostrils daily.  ? KLOR-CON M20 20 MEQ tablet TAKE 1 TABLET BY MOUTH EVERY DAY  ? losartan-hydrochlorothiazide (HYZAAR) 100-12.5 MG tablet Take 1 tablet by mouth daily.  ? Melatonin 10 MG TABS Take by mouth.  ? Multiple Vitamin (MULTIVITAMIN) capsule Take 1 capsule by mouth daily.  ? omeprazole (PRILOSEC) 40 MG capsule TAKE 1 CAPSULE BY MOUTH EVERY DAY  ? Probiotic Product (PROBIOTIC DAILY PO) Take 1 tablet by mouth daily.  ?  traZODone (DESYREL) 50 MG tablet TAKE 1 TABLET BY MOUTH AT BEDTIME AS NEEDED FOR SLEEP.  ?  ? ?Allergies:   Patient has no known allergies.  ? ?Social History  ? ?Socioeconomic History  ? Marital status: Widowed  ?  Spouse name: Montine Circle  ? Number of children: 2  ? Years of education: 52  ? Highest education level: Not on file  ?Occupational History  ? Occupation: QUALITY ANALYST  ?  Employer: [employer]  ?Tobacco Use  ? Smoking status: Never  ? Smokeless tobacco: Never  ?Vaping Use  ? Vaping Use: Never used  ?Substance and Sexual Activity  ? Alcohol use: Yes  ?  Alcohol/week: 0.0 standard drinks  ?  Comment: socially  ? Drug use: No  ? Sexual activity: Yes  ?  Birth control/protection: Surgical  ?Other  Topics Concern  ? Not on file  ?Social History Narrative  ? Married, college education, 2 children   ? Quality analyst for [employer]   ? patient is right handed and consumes green tea daily  ? Never smoker no tobacco, no drug use, only rare to occasional alcohol  ? ?Social Determinants of Health  ? ?Financial Resource Strain: Not on file  ?Food Insecurity: Not on file  ?Transportation Needs: Not on file  ?Physical Activity: Not on file  ?Stress: Not on file  ?Social Connections: Not on file  ?  ? ?Family History:  The patient's  family history includes Cancer in her brother; Coronary artery disease in her mother; Diabetes in her mother; Hypertension in her father and mother; Sudden death in her mother.  ? ?ROS:   ?Please see the history of present illness.    ?ROS All other systems reviewed and are negative. ? ? ?PHYSICAL EXAM:   ?VS:  BP 124/82   Pulse 75   Ht 5' 1.5" (1.562 m)   Wt 265 lb (120.2 kg)   SpO2 98%   BMI 49.26 kg/m?   ?Physical Exam  ?GEN: Obese, in no acute distress  ?Neck: no JVD, carotid bruits, or masses ?Cardiac:RRR; no murmurs, rubs, or gallops  ?Respiratory:  clear to auscultation bilaterally, normal work of breathing ?GI: soft, nontender, nondistended, + BS ?Ext: without cyanosis, clubbing, or edema, Good distal pulses bilaterally ?Neuro:  Alert and Oriented x 3,  ?Psych: euthymic mood, full affect ? ?Wt Readings from Last 3 Encounters:  ?08/01/21 265 lb (120.2 kg)  ?01/20/21 266 lb (120.7 kg)  ?12/27/20 261 lb 8 oz (118.6 kg)  ?  ? ? ?Studies/Labs Reviewed:  ? ?EKG:  EKG is  ordered today.  The ekg ordered today demonstrates NSR, normal EKG ? ?Recent Labs: ?01/20/2021: ALT 14; BUN 17; Creat 0.87; Hemoglobin 13.9; Platelets 263; Potassium 3.7; Sodium 139  ? ?Lipid Panel ?   ?Component Value Date/Time  ? CHOL 180 01/20/2021 1148  ? CHOL 204 (H) 11/11/2017 1137  ? TRIG 104 01/20/2021 1148  ? HDL 70 01/20/2021 1148  ? HDL 74 11/11/2017 1137  ? CHOLHDL 2.6 01/20/2021 1148  ? VLDL 16  12/22/2013 1142  ? [place] 90 01/20/2021 1148  ? ? ?Additional studies/ records that were reviewed today include:  ?Coronary CTA 03/01/2020 ?Aorta: Normal size. Ascending aortic atherosclerosis noted. No ?dissection. ?  ?Aortic Valve:  Tri-leaflet.  No calcifications. ?  ?Coronary Arteries:  Normal coronary origin.  Right dominance. ?  ?Coronary calcium score of 0. This was 1st percentile for age, sex, ?and race matched control. ?  ?RCA is a large  dominant artery that gives rise to PDA and PLA. There ?is no plaque. ?  ?Left main is a large artery that gives rise to LAD and LCX arteries. ?  ?LAD is a large vessel that gives rise to one small D1 branch. There ?are two minimally non-obstructive (1-24%) soft plaques in the mid ?vessel. ?  ?LCX is a non-dominant artery that gives rise to one moderate OM1 ?branch. There is a mildly non-obstructive (25-49%) soft plaque in ?the mid OM1 vessel and in the mid LCX. ?  ?Other findings: ?  ?Normal pulmonary vein drainage into the left atrium. ?  ?Normal left atrial appendage without a thrombus. ?  ?Normal size of the pulmonary artery. ?  ?Extra-cardiac findings: See attached radiology report for ?non-cardiac structures. ?  ?IMPRESSION: ?1. Coronary calcium score of 0. This was 1st percentile for age, ?sex, and race matched control. ?  ?2. Normal coronary origin with right dominance. ?  ?3. Ascending aortic atherosclerosis noted. ?  ?4. CAD-RADS 2. Mild non-obstructive CAD (25-49%). Consider ?non-atherosclerotic causes of chest pain. Consider preventive ?therapy and risk factor modification.03/2013 Event Monitor 7 Days ?No symptoms, normal sinus rhythm ?  ?09/2015 echo ?Study Conclusions ?  ?- Left ventricle: The cavity size was normal. Wall thickness was ?  normal. Systolic function was normal. The estimated ejection ?  fraction was in the range of 60% to 65%. Wall motion was normal; ?  there were no regional wall motion abnormalities. Left ?  ventricular diastolic function  parameters were normal for the ?  patient&'s age. ?- Right atrium: Central venous pressure (est): 3 mm Hg. ?- Atrial septum: No defect or patent foramen ovale was identified. ?- Tricuspid valve: There was mild regurgitat

## 2021-08-01 ENCOUNTER — Encounter: Payer: Self-pay | Admitting: Physician Assistant

## 2021-08-01 ENCOUNTER — Ambulatory Visit: Payer: 59 | Admitting: Physician Assistant

## 2021-08-01 VITALS — BP 124/82 | HR 75 | Ht 61.5 in | Wt 265.0 lb

## 2021-08-01 DIAGNOSIS — Z8249 Family history of ischemic heart disease and other diseases of the circulatory system: Secondary | ICD-10-CM

## 2021-08-01 DIAGNOSIS — R079 Chest pain, unspecified: Secondary | ICD-10-CM | POA: Diagnosis not present

## 2021-08-01 DIAGNOSIS — R0609 Other forms of dyspnea: Secondary | ICD-10-CM

## 2021-08-01 DIAGNOSIS — E785 Hyperlipidemia, unspecified: Secondary | ICD-10-CM

## 2021-08-01 NOTE — Patient Instructions (Signed)
Medication Instructions:  ?Your physician recommends that you continue on your current medications as directed. Please refer to the Current Medication list given to you today. ? ?*If you need a refill on your cardiac medications before your next appointment, please call your pharmacy* ? ? ?Lab Work: ?Your physician recommends that you return for lab work fasting.  ? ?If you have labs (blood work) drawn today and your tests are completely normal, you will receive your results only by: ?MyChart Message (if you have MyChart) OR ?A paper copy in the mail ?If you have any lab test that is abnormal or we need to change your treatment, we will call you to review the results. ? ? ?Testing/Procedures: ?NONE  ? ? ?Follow-Up: ?At [facility], you and your health needs are our priority.  As part of our continuing mission to provide you with exceptional heart care, we have created designated Provider Care Teams.  These Care Teams include your primary Cardiologist (physician) and Advanced Practice Providers (APPs -  Physician Assistants and Nurse Practitioners) who all work together to provide you with the care you need, when you need it. ? ?We recommend signing up for the patient portal called "MyChart".  Sign up information is provided on this After Visit Summary.  MyChart is used to connect with patients for Virtual Visits (Telemedicine).  Patients are able to view lab/test results, encounter notes, upcoming appointments, etc.  Non-urgent messages can be sent to your provider as well.   ?To learn more about what you can do with MyChart, go to [email or web address].   ? ?Your next appointment:   ?1 year(s) ? ?The format for your next appointment:   ?In Person ? ?Provider:   ?Carlyle Dolly, MD  ? ? ?Other Instructions ?Thank you for choosing [facility]! ? ? ? ?Important Information About Sugar ? ? ? ? ? ? ?

## 2021-08-18 ENCOUNTER — Other Ambulatory Visit (HOSPITAL_COMMUNITY)
Admission: RE | Admit: 2021-08-18 | Discharge: 2021-08-18 | Disposition: A | Discharge status: Home / Self Care | Payer: 59 | Source: Ambulatory Visit | Attending: Physician Assistant | Admitting: Physician Assistant

## 2021-08-18 DIAGNOSIS — E785 Hyperlipidemia, unspecified: Secondary | ICD-10-CM | POA: Diagnosis present

## 2021-08-18 DIAGNOSIS — R0609 Other forms of dyspnea: Secondary | ICD-10-CM | POA: Insufficient documentation

## 2021-08-18 LAB — TSH: TSH: 2.05 u[IU]/mL (ref 0.350–4.500)

## 2021-08-18 LAB — LIPID PANEL
Cholesterol: 169 mg/dL (ref 0–200)
HDL: 69 mg/dL (ref >40)
LDL Cholesterol: 83 mg/dL (ref 0–99)
Total CHOL/HDL Ratio: 2.4 RATIO
Triglycerides: 87 mg/dL (ref <150)
VLDL: 17 mg/dL (ref 0–40)

## 2021-08-18 LAB — COMPREHENSIVE METABOLIC PANEL
ALT: 18 U/L (ref 0–44)
AST: 18 U/L (ref 15–41)
Albumin: 3.8 g/dL (ref 3.5–5.0)
Alkaline Phosphatase: 86 U/L (ref 38–126)
Anion gap: 7 (ref 5–15)
BUN: 11 mg/dL (ref 6–20)
CO2: 29 mmol/L (ref 22–32)
Calcium: 9.2 mg/dL (ref 8.9–10.3)
Chloride: 105 mmol/L (ref 98–111)
Creatinine, Ser: 0.66 mg/dL (ref 0.44–1.00)
GFR, Estimated: >60 mL/min (ref >60)
Glucose, Bld: 98 mg/dL (ref 70–99)
Potassium: 3.8 mmol/L (ref 3.5–5.1)
Sodium: 141 mmol/L (ref 135–145)
Total Bilirubin: 0.3 mg/dL (ref 0.3–1.2)
Total Protein: 7.1 g/dL (ref 6.5–8.1)

## 2021-08-18 LAB — CBC
HCT: 41.5 % (ref 36.0–46.0)
Hemoglobin: 13.3 g/dL (ref 12.0–15.0)
MCH: 29.2 pg (ref 26.0–34.0)
MCHC: 32 g/dL (ref 30.0–36.0)
MCV: 91 fL (ref 80.0–100.0)
Platelets: 228 10*3/uL (ref 150–400)
RBC: 4.56 MIL/uL (ref 3.87–5.11)
RDW: 13.7 % (ref 11.5–15.5)
WBC: 3.9 10*3/uL — ABNORMAL LOW (ref 4.0–10.5)
nRBC: 0 % (ref 0.0–0.2)

## 2021-08-23 ENCOUNTER — Other Ambulatory Visit: Payer: Self-pay | Admitting: Family Medicine

## 2021-08-23 NOTE — Telephone Encounter (Signed)
Requested medication (s) are due for refill today: Yes  Requested medication (s) are on the active medication list: Yes  Last refill:  08/19/20  Future visit scheduled: No. Left message to call and make appointment.  Notes to clinic:  Prescription has expired.    Requested Prescriptions  Pending Prescriptions Disp Refills   losartan-hydrochlorothiazide (HYZAAR) 100-12.5 MG tablet [Pharmacy Med Name: LOSARTAN-HCTZ 100-12.5 MG TAB] 30 tablet 11    Sig: TAKE 1 TABLET BY MOUTH EVERY DAY     Cardiovascular: ARB + Diuretic Combos Failed - 08/23/2021  1:47 AM      Failed - Valid encounter within last 6 months    Recent Outpatient Visits           7 months ago Benign essential HTN   [place] Susy Frizzle, MD   1 year ago Benign essential HTN   [place] Susy Frizzle, MD   1 year ago Benign essential HTN   [place] Susy Frizzle, MD   1 year ago Breast pain in female   [place], Matthias Hughs, FNP   2 years ago Adrenal adenoma, right   [place], Cammie Mcgee, MD               Passed - K in normal range and within 180 days    Potassium  Date Value Ref Range Status  08/18/2021 3.8 3.5 - 5.1 mmol/L Final         Passed - Na in normal range and within 180 days    Sodium  Date Value Ref Range Status  08/18/2021 141 135 - 145 mmol/L Final  11/11/2017 138 134 - 144 mmol/L Final         Passed - Cr in normal range and within 180 days    Creat  Date Value Ref Range Status  01/20/2021 0.87 0.50 - 1.03 mg/dL Final   Creatinine, Ser  Date Value Ref Range Status  08/18/2021 0.66 0.44 - 1.00 mg/dL Final         Passed - eGFR is 10 or above and within 180 days    GFR, Est African American  Date Value Ref Range Status  07/07/2020 111 > OR = 60 mL/min/1.38m2 Final   GFR, Est Non African American  Date Value Ref Range Status  07/07/2020 96 > OR = 60  mL/min/1.4m2 Final   GFR, Estimated  Date Value Ref Range Status  08/18/2021 >60 >60 mL/min Final    Comment:    (NOTE) Calculated using the CKD-EPI Creatinine Equation (2021)    GFR  Date Value Ref Range Status  06/10/2012 107.26 >60.00 mL/min Final   eGFR  Date Value Ref Range Status  01/20/2021 79 > OR = 60 mL/min/1.59m2 Final    Comment:    The eGFR is based on the CKD-EPI 2021 equation. To calculate  the new eGFR from a previous Creatinine or Cystatin C result, go to https://www.kidney.org/professionals/ kdoqi/gfr%5Fcalculator          Passed - Patient is not pregnant      Passed - Last BP in normal range    BP Readings from Last 1 Encounters:  08/01/21 124/82

## 2021-09-07 ENCOUNTER — Encounter: Payer: Self-pay | Admitting: Family Medicine

## 2021-09-08 ENCOUNTER — Other Ambulatory Visit: Payer: Self-pay | Admitting: Family Medicine

## 2021-09-08 MED ORDER — FLUCONAZOLE 150 MG PO TABS: 150.0000 mg | ORAL_TABLET | Freq: Once | ORAL | 0 refills | Status: AC

## 2021-09-25 ENCOUNTER — Ambulatory Visit: Payer: 59 | Admitting: Family Medicine

## 2021-10-02 ENCOUNTER — Ambulatory Visit: Payer: 59 | Admitting: Family Medicine

## 2021-10-02 VITALS — BP 130/80 | HR 76 | Temp 98.3°F | Ht 61.0 in | Wt 265.0 lb

## 2021-10-02 DIAGNOSIS — I10 Essential (primary) hypertension: Secondary | ICD-10-CM

## 2021-10-02 DIAGNOSIS — R7303 Prediabetes: Secondary | ICD-10-CM | POA: Diagnosis not present

## 2021-10-02 NOTE — Progress Notes (Signed)
Subjective:    Patient ID: Patricia [name], female    DOB: 12-May-1965, 56 y.o.   MRN: [id number]  HPI Patient is a very sweet 56 year old African-American female who presents today for a checkup.  Her insurance would not cover Saxenda. Wt Readings from Last 3 Encounters:  10/02/21 265 lb (120.2 kg)  08/01/21 265 lb (120.2 kg)  01/20/21 266 lb (120.7 kg)   Unfortunately she has not been able to successfully lose weight.  She denies any chest pain shortness of breath or dyspnea on exertion.  She does have prediabetes.  She denies any polyuria, polydipsia, or blurry vision.  Blood pressure today is well controlled at 130/80. Past Medical History:  Diagnosis Date   Arthritis    "spine-DDD-intermittent sciatic pain"   Chronic cervicitis    resolved   Diabetes mellitus without complication (HCC)    diet controlled   GERD (gastroesophageal reflux disease)    Heart murmur    "benign"-routinely sees yearly - Dr. Deretha Emory, cardiology   History of kidney stones    multiple kidney stones-passed.   Hypertension    IBS (irritable bowel syndrome)    controlled with diet   Menorrhagia    resolved s/p Hysterectomy   Obesity    Sleep apnea    cpap used nightly   Vitamin D deficiency    Past Surgical History:  Procedure Laterality Date   BREAST LUMPECTOMY Left    benign hamartoma   CESAREAN SECTION     x1   COLONOSCOPY  10/19/2005   ENDOMETRIAL ABLATION     ESOPHAGOGASTRODUODENOSCOPY  12/21/2008   HYSTEROSCOPY WITH D & C     resection of endometrial polyp   LAPAROSCOPIC GASTRIC SLEEVE RESECTION N/A 03/12/2016   Procedure: LAPAROSCOPIC GASTRIC SLEEVE RESECTION WITH UPPER ENDO;  Surgeon: Arta Bruce Kinsinger, MD;  Location: [facility];  Service: General;  Laterality: N/A;   TUBAL LIGATION     Uterine Polyps Removed     VAGINAL HYSTERECTOMY     fibroids   Current Outpatient Medications on File Prior to Visit  Medication Sig Dispense Refill   ALPRAZolam (XANAX) 0.5 MG tablet Take  1 tablet (0.5 mg total) by mouth 3 (three) times daily as needed. 30 tablet 0   atorvastatin (LIPITOR) 10 MG tablet TAKE 1 TABLET BY MOUTH DAILY. PLEASE SCHEDULE APPOINTMENT FOR FUTURE REFILLS. 2ND ATTEMPT 30 tablet 2   fluticasone (FLONASE) 50 MCG/ACT nasal spray Place 2 sprays into both nostrils daily. 16 g 6   ibuprofen (ADVIL) 600 MG tablet Take 600 mg by mouth 3 (three) times daily.     KLOR-CON M20 20 MEQ tablet TAKE 1 TABLET BY MOUTH EVERY DAY 30 tablet 8   losartan-hydrochlorothiazide (HYZAAR) 100-12.5 MG tablet TAKE 1 TABLET BY MOUTH EVERY DAY 30 tablet 11   Melatonin 10 MG TABS Take by mouth.     Multiple Vitamin (MULTIVITAMIN) capsule Take 1 capsule by mouth daily.     omeprazole (PRILOSEC) 40 MG capsule TAKE 1 CAPSULE BY MOUTH EVERY DAY 30 capsule 11   Probiotic Product (PROBIOTIC DAILY PO) Take 1 tablet by mouth daily.     traZODone (DESYREL) 50 MG tablet TAKE 1 TABLET BY MOUTH AT BEDTIME AS NEEDED FOR SLEEP. 90 tablet 2   SAXENDA 18 MG/3ML SOPN INJECT 0.6 MG UNDER THE SKIN ONCE DAILY (Patient not taking: Reported on 10/02/2021) 15 mL 3   No current facility-administered medications on file prior to visit.   No Known Allergies Social History  Socioeconomic History   Marital status: Widowed    Spouse name: Montine Circle   Number of children: 2   Years of education: 16   Highest education level: Not on file  Occupational History   Occupation: QUALITY ANALYST    Employer: [employer]  Tobacco Use   Smoking status: Never   Smokeless tobacco: Never  Vaping Use   Vaping Use: Never used  Substance and Sexual Activity   Alcohol use: Yes    Alcohol/week: 0.0 standard drinks of alcohol    Comment: socially   Drug use: No   Sexual activity: Yes    Birth control/protection: Surgical  Other Topics Concern   Not on file  Social History Narrative   Married, college education, 2 children    Teacher, early years/pre for [employer]    patient is right handed and consumes green tea  daily   Never smoker no tobacco, no drug use, only rare to occasional alcohol   Social Determinants of Radio broadcast assistant Strain: Not on file  Food Insecurity: Not on file  Transportation Needs: Not on file  Physical Activity: Not on file  Stress: Not on file  Social Connections: Not on file  Intimate Partner Violence: Not on file   Family History  Problem Relation Age of Onset   Hypertension Father    Hypertension Mother    Diabetes Mother    Coronary artery disease Mother    Sudden death Mother    Cancer Brother        carcinoid tumer of the appendix   Colon cancer Neg Hx    Esophageal cancer Neg Hx    Stomach cancer Neg Hx    Rectal cancer Neg Hx       Review of Systems  All other systems reviewed and are negative.      Objective:   Physical Exam Vitals reviewed.  Constitutional:      General: She is not in acute distress.    Appearance: She is well-developed. She is not diaphoretic.  HENT:     Head: Normocephalic and atraumatic.     Right Ear: External ear normal.     Left Ear: External ear normal.     Nose: Nose normal.     Mouth/Throat:     Pharynx: No oropharyngeal exudate.  Eyes:     General: No scleral icterus.       Right eye: No discharge.        Left eye: No discharge.     Conjunctiva/sclera: Conjunctivae normal.     Pupils: Pupils are equal, round, and reactive to light.  Neck:     Thyroid: No thyromegaly.     Vascular: No JVD.     Trachea: No tracheal deviation.  Cardiovascular:     Rate and Rhythm: Normal rate and regular rhythm.     Heart sounds: Normal heart sounds. No murmur heard.    No friction rub. No gallop.  Pulmonary:     Effort: Pulmonary effort is normal. No respiratory distress.     Breath sounds: Normal breath sounds. No stridor. No wheezing or rales.  Chest:     Chest wall: No tenderness.  Abdominal:     General: Bowel sounds are normal. There is no distension.     Palpations: Abdomen is soft. There is no mass.      Tenderness: There is no abdominal tenderness. There is no guarding or rebound.     Hernia: No hernia is present.  Musculoskeletal:  Cervical back: Normal range of motion and neck supple.  Lymphadenopathy:     Cervical: No cervical adenopathy.  Skin:    General: Skin is warm.     Coloration: Skin is not pale.     Findings: No erythema or rash.  Neurological:     Mental Status: She is alert and oriented to person, place, and time.     Cranial Nerves: No cranial nerve deficit.     Sensory: No sensory deficit.     Motor: No abnormal muscle tone.     Coordination: Coordination normal.     Deep Tendon Reflexes: Reflexes normal.  Psychiatric:        Behavior: Behavior normal.        Thought Content: Thought content normal.           Assessment & Plan:  Benign essential HTN - Plan: Hemoglobin A1c, CBC with Differential/Platelet, Lipid panel, COMPLETE METABOLIC PANEL WITH GFR  Prediabetes - Plan: Hemoglobin A1c, CBC with Differential/Platelet, Lipid panel, COMPLETE METABOLIC PANEL WITH GFR We discussed other weight loss medications however given her age and her history of hypertension I will recommend against phentermine.  We also discussed Topamax but the patient is not interested in Topamax at this time.  We discussed Mancel Parsons however if her insurance will not cover weight loss medication I believe that this would be cost prohibitive.  I will monitor her prediabetes with a hemoglobin A1c and check a CBC CMP and a lipid panel today

## 2021-10-03 LAB — LIPID PANEL
Cholesterol: 164 mg/dL (ref <200)
HDL: 68 mg/dL (ref >=50)
LDL Cholesterol (Calc): 75 mg/dL (calc)
Non-HDL Cholesterol (Calc): 96 mg/dL (calc) (ref <130)
Total CHOL/HDL Ratio: 2.4 (calc) (ref <5.0)
Triglycerides: 130 mg/dL (ref <150)

## 2021-10-03 LAB — CBC WITH DIFFERENTIAL/PLATELET
Absolute Monocytes: 406 cells/uL (ref 200–950)
Basophils Absolute: 29 cells/uL (ref 0–200)
Basophils Relative: 0.7 %
Eosinophils Absolute: 78 cells/uL (ref 15–500)
Eosinophils Relative: 1.9 %
HCT: 41.6 % (ref 35.0–45.0)
Hemoglobin: 13.6 g/dL (ref 11.7–15.5)
Lymphs Abs: 1550 cells/uL (ref 850–3900)
MCH: 29 pg (ref 27.0–33.0)
MCHC: 32.7 g/dL (ref 32.0–36.0)
MCV: 88.7 fL (ref 80.0–100.0)
MPV: 10 fL (ref 7.5–12.5)
Monocytes Relative: 9.9 %
Neutro Abs: 2038 cells/uL (ref 1500–7800)
Neutrophils Relative %: 49.7 %
Platelets: 246 10*3/uL (ref 140–400)
RBC: 4.69 10*6/uL (ref 3.80–5.10)
RDW: 13.1 % (ref 11.0–15.0)
Total Lymphocyte: 37.8 %
WBC: 4.1 10*3/uL (ref 3.8–10.8)

## 2021-10-03 LAB — COMPLETE METABOLIC PANEL WITH GFR
AG Ratio: 1.5 (calc) (ref 1.0–2.5)
ALT: 13 U/L (ref 6–29)
AST: 14 U/L (ref 10–35)
Albumin: 3.9 g/dL (ref 3.6–5.1)
Alkaline phosphatase (APISO): 91 U/L (ref 37–153)
BUN: 15 mg/dL (ref 7–25)
CO2: 28 mmol/L (ref 20–32)
Calcium: 9.2 mg/dL (ref 8.6–10.4)
Chloride: 104 mmol/L (ref 98–110)
Creat: 0.77 mg/dL (ref 0.50–1.03)
Globulin: 2.6 g/dL (calc) (ref 1.9–3.7)
Glucose, Bld: 93 mg/dL (ref 65–99)
Potassium: 4.3 mmol/L (ref 3.5–5.3)
Sodium: 143 mmol/L (ref 135–146)
Total Bilirubin: 0.4 mg/dL (ref 0.2–1.2)
Total Protein: 6.5 g/dL (ref 6.1–8.1)
eGFR: 92 mL/min/{1.73_m2} (ref >=60)

## 2021-10-03 LAB — HEMOGLOBIN A1C
Hgb A1c MFr Bld: 5.8 % of total Hgb — ABNORMAL HIGH (ref <5.7)
Mean Plasma Glucose: 120 mg/dL
eAG (mmol/L): 6.6 mmol/L

## 2021-10-05 ENCOUNTER — Other Ambulatory Visit: Payer: Self-pay | Admitting: Cardiology

## 2021-10-12 ENCOUNTER — Encounter (HOSPITAL_COMMUNITY): Payer: Self-pay | Admitting: *Deleted

## 2021-10-24 ENCOUNTER — Other Ambulatory Visit: Payer: Self-pay | Admitting: Family Medicine

## 2021-12-25 NOTE — Patient Instructions (Addendum)
Please continue using your CPAP regularly. While your insurance requires that you use CPAP at least 4 hours each night on 70% of the nights, I recommend, that you not skip any nights and use it throughout the night if you can. Getting used to CPAP and staying with the treatment long term does take time and patience and discipline. Untreated obstructive sleep apnea when it is moderate to severe can have an adverse impact on cardiovascular health and raise her risk for heart disease, arrhythmias, hypertension, congestive heart failure, stroke and diabetes. Untreated obstructive sleep apnea causes sleep disruption, nonrestorative sleep, and sleep deprivation. This can have an impact on your day to day functioning and cause daytime sleepiness and impairment of cognitive function, memory loss, mood disturbance, and problems focussing. Using CPAP regularly can improve these symptoms.  You may consider melatonin ER over the counter Consider resuming trazodone as directed by PCP if needed.   Follow up in 1 year

## 2021-12-25 NOTE — Progress Notes (Signed)
PATIENT: Patricia [name] DOB: 12/19/1965  REASON FOR VISIT: follow up HISTORY FROM: patient  Chief Complaint  Patient presents with   Follow-up    Pt in room #2 and alone. Pt here today for CPAP.    HISTORY OF PRESENT ILLNESS:  12/26/21 ALL: [name] returns for annual follow up for OSA on CPAP. She continues to do well on therapy. She is using CPAP nightly for about 6.5h. She denies concerns with machine or supplies. She does wake after about 3-4 hours of sleep. She is usually able to return to sleep. She feels that her mind starts working and makes it difficult to return to sleep. PCP started her on trazodone but she is not taking consistently.     12/27/2020 ALL: Patricia [name] returns for follow up for OSA on CPAP. She continues to do well with therapy. She is using CPAP most every night for at least 4 hours. She denies concerns with supplies or machine. She is sleeping fairly well. She lost her husband in 05/2020 to pancreatic cancer. He passed 1 month following diagnosis. She is seeing a Social worker and feels she is doing well. She has a great support system. She torn her ACL and recently released to return to activity. She is planning to start PT.     12/28/2019 ALL:  Patricia [name] is a 56 y.o. female here today for follow up for OSA on CPAP. She is doing well. She is using CPAP nightly. She denies concerns with machine or supplies. She is working with PCP for concerns of sciatica. She does note waking around 3am at times. She feels that her mind will start racing.  She has tried melatonin in the past that was helpful. Set up date 08/2018.   Compliance report dated 11/24/2019 through 12/23/2019 reveals that she used CPAP 30 of the past 30 days for compliance of 100%. She is CPAP greater than 4 hours all 30 days. Average usage was 7 hours and 13 minutes. Residual AHI was 0.7 on 8 cm of water and an EPR of 3. There was no significant leak noted.  HISTORY: (copied from my note on  12/25/2018)  Patricia [name] is a 56 y.o. female here today for follow up of OSA on CPAP. She is doing very well with her new CPAP machine.  Compliance report dated 11/24/2018 through 12/23/2018 reveals that she is using CPAP 27 out of the last 30 days for compliance of 90%.  27 days she used CPAP greater than 4 hours for compliance 90%.  Average usage was 7 hours and 15 minutes.  AHI was 0.6 on 8 cm of water and an EPR of 3.  There was no significant leak noted.  She does report going out of town for a week and that contributes to days lost.   HISTORY: (copied from Dr Guadelupe Sabin note on 09/15/2018)   Patricia [name] is a very pleasant 56 year old right-handed woman with an underlying medical history of irritable bowel syndrome, morbid obesity, hypertension, reflux disease as well as kidney stones, who presents for follow-up consultation of her obstructive sleep apnea, well-established on treatment with CPAP. She is unaccompanied today. I last saw her on 08/15/2017, at which time she was stable on treatment.    Today, 09/15/2018: I reviewed her CPAP compliance data from 08/16/2018 through 09/14/2018, which is a total of 30 days, during which time she used her machine every night with percent used days greater than 4 hours at 93%, indicating excellent compliance  with an average usage of 6 hours and 28 minutes, residual AHI at goal at 1.2/h, leak acceptable with a 95th percentile at 7.9 L/min on a pressure of 8 cm with EPR of 3.  She reports that her supplies are expensive.  She buys most of them online.  She should be eligible for new machine and I explained to her and we should try to get this through her DME company, [employer].  She would be willing to give it a try.   She reports doing well. Her weight has been stable. She does exercise on a regular basis.  She has had some flareup of her right-sided sciatica.   The patient's allergies, current medications, family history, past medical history, past social history,  past surgical history and problem list were reviewed and updated as appropriate.    REVIEW OF SYSTEMS: Out of a complete 14 system review of symptoms, the patient complains only of the following symptoms, fatigue, less motivation, left foot pain and all other reviewed systems are negative.      12/26/2021    7:58 AM  Results of the Epworth flowsheet  Sitting and reading 3  Watching TV 1  Sitting, inactive in a public place (e.g. a theatre or a meeting) 1  As a passenger in a car for an hour without a break 0  Lying down to rest in the afternoon when circumstances permit 3  Sitting and talking to someone 0  Sitting quietly after a lunch without alcohol 0  In a car, while stopped for a few minutes in traffic 0  Total score 8      ALLERGIES: No Known Allergies  HOME MEDICATIONS: Outpatient Medications Prior to Visit  Medication Sig Dispense Refill   ALPRAZolam (XANAX) 0.5 MG tablet Take 1 tablet (0.5 mg total) by mouth 3 (three) times daily as needed. 30 tablet 0   atorvastatin (LIPITOR) 10 MG tablet TAKE 1 TABLET BY MOUTH DAILY. PLEASE SCHEDULE APPOINTMENT FOR FUTURE REFILLS. 2ND ATTEMPT 30 tablet 2   fluticasone (FLONASE) 50 MCG/ACT nasal spray Place 2 sprays into both nostrils daily. 16 g 6   ibuprofen (ADVIL) 600 MG tablet Take 600 mg by mouth 3 (three) times daily.     KLOR-CON M20 20 MEQ tablet TAKE 1 TABLET BY MOUTH EVERY DAY 30 tablet 8   losartan-hydrochlorothiazide (HYZAAR) 100-12.5 MG tablet TAKE 1 TABLET BY MOUTH EVERY DAY 30 tablet 11   Melatonin 10 MG TABS Take by mouth.     Multiple Vitamin (MULTIVITAMIN) capsule Take 1 capsule by mouth daily.     omeprazole (PRILOSEC) 40 MG capsule TAKE 1 CAPSULE BY MOUTH EVERY DAY 30 capsule 11   Probiotic Product (PROBIOTIC DAILY PO) Take 1 tablet by mouth daily.     SAXENDA 18 MG/3ML SOPN INJECT 0.6 MG UNDER THE SKIN ONCE DAILY 15 mL 3   traZODone (DESYREL) 50 MG tablet TAKE 1 TABLET BY MOUTH AT BEDTIME AS NEEDED FOR SLEEP. 90  tablet 2   No facility-administered medications prior to visit.    PAST MEDICAL HISTORY: Past Medical History:  Diagnosis Date   Arthritis    "spine-DDD-intermittent sciatic pain"   Chronic cervicitis    resolved   Diabetes mellitus without complication (HCC)    diet controlled   GERD (gastroesophageal reflux disease)    Heart murmur    "benign"-routinely sees yearly - Dr. Deretha Emory, cardiology   History of kidney stones    multiple kidney stones-passed.   Hypertension  IBS (irritable bowel syndrome)    controlled with diet   Menorrhagia    resolved s/p Hysterectomy   Obesity    Sleep apnea    cpap used nightly   Vitamin D deficiency     PAST SURGICAL HISTORY: Past Surgical History:  Procedure Laterality Date   BREAST LUMPECTOMY Left    benign hamartoma   CESAREAN SECTION     x1   COLONOSCOPY  10/19/2005   ENDOMETRIAL ABLATION     ESOPHAGOGASTRODUODENOSCOPY  12/21/2008   HYSTEROSCOPY WITH D & C     resection of endometrial polyp   LAPAROSCOPIC GASTRIC SLEEVE RESECTION N/A 03/12/2016   Procedure: LAPAROSCOPIC GASTRIC SLEEVE RESECTION WITH UPPER ENDO;  Surgeon: Arta Bruce Kinsinger, MD;  Location: [facility];  Service: General;  Laterality: N/A;   TUBAL LIGATION     Uterine Polyps Removed     VAGINAL HYSTERECTOMY     fibroids    FAMILY HISTORY: Family History  Problem Relation Age of Onset   Hypertension Father    Hypertension Mother    Diabetes Mother    Coronary artery disease Mother    Sudden death Mother    Cancer Brother        carcinoid tumer of the appendix   Colon cancer Neg Hx    Esophageal cancer Neg Hx    Stomach cancer Neg Hx    Rectal cancer Neg Hx     SOCIAL HISTORY: Social History   Socioeconomic History   Marital status: Widowed    Spouse name: Montine Circle   Number of children: 2   Years of education: 16   Highest education level: Not on file  Occupational History   Occupation: QUALITY ANALYST    Employer: [employer]   Tobacco Use   Smoking status: Never   Smokeless tobacco: Never  Vaping Use   Vaping Use: Never used  Substance and Sexual Activity   Alcohol use: Yes    Alcohol/week: 0.0 standard drinks of alcohol    Comment: socially   Drug use: No   Sexual activity: Yes    Birth control/protection: Surgical  Other Topics Concern   Not on file  Social History Narrative   Married, college education, 2 children    Teacher, early years/pre for [employer]    patient is right handed and consumes green tea daily   Never smoker no tobacco, no drug use, only rare to occasional alcohol   Social Determinants of Radio broadcast assistant Strain: Not on file  Food Insecurity: Not on file  Transportation Needs: Not on file  Physical Activity: Not on file  Stress: Not on file  Social Connections: Not on file  Intimate Partner Violence: Not on file      PHYSICAL EXAM  Vitals:   12/26/21 0748  BP: (!) 144/88  Pulse: 73  Weight: 263 lb 8 oz (119.5 kg)  Height: '5\' 1"'$  (1.549 m)     Body mass index is 49.79 kg/m.  Generalized: Well developed, in no acute distress  Cardiology: normal rate and rhythm, no murmur noted Respiratory: clear to auscultation bilaterally  Neurological examination  Mentation: Alert oriented to time, place, history taking. Follows all commands speech and language fluent Cranial nerve II-XII: Pupils were equal round reactive to light. Extraocular movements were full, visual field were full  Motor: The motor testing reveals 5 over 5 strength of all 4 extremities. Good symmetric motor tone is noted throughout.  Gait and station: Gait is normal.  DIAGNOSTIC DATA (LABS, IMAGING, TESTING) - I reviewed patient records, labs, notes, testing and imaging myself where available.      No data to display           Lab Results  Component Value Date   WBC 4.1 10/02/2021   HGB 13.6 10/02/2021   HCT 41.6 10/02/2021   MCV 88.7 10/02/2021   PLT 246 10/02/2021       Component Value Date/Time   NA 143 10/02/2021 0846   NA 138 11/11/2017 1137   K 4.3 10/02/2021 0846   CL 104 10/02/2021 0846   CO2 28 10/02/2021 0846   GLUCOSE 93 10/02/2021 0846   BUN 15 10/02/2021 0846   BUN 11 11/11/2017 1137   CREATININE 0.77 10/02/2021 0846   CALCIUM 9.2 10/02/2021 0846   PROT 6.5 10/02/2021 0846   PROT 6.6 11/11/2017 1137   ALBUMIN 3.8 08/18/2021 0927   ALBUMIN 4.1 11/11/2017 1137   AST 14 10/02/2021 0846   ALT 13 10/02/2021 0846   ALKPHOS 86 08/18/2021 0927   BILITOT 0.4 10/02/2021 0846   BILITOT 0.3 11/11/2017 1137   GFRNONAA >60 08/18/2021 0927   GFRNONAA 96 07/07/2020 0831   GFRAA 111 07/07/2020 0831   Lab Results  Component Value Date   CHOL 164 10/02/2021   HDL 68 10/02/2021   LDLCALC 75 10/02/2021   TRIG 130 10/02/2021   CHOLHDL 2.4 10/02/2021   Lab Results  Component Value Date   HGBA1C 5.8 (H) 10/02/2021   Lab Results  Component Value Date   VITAMINB12 >2000 (H) 11/11/2017   Lab Results  Component Value Date   TSH 2.050 08/18/2021       ASSESSMENT AND PLAN 56 y.o. year old female  has a past medical history of Arthritis, Chronic cervicitis, Diabetes mellitus without complication ([place]), GERD (gastroesophageal reflux disease), Heart murmur, History of kidney stones, Hypertension, IBS (irritable bowel syndrome), Menorrhagia, Obesity, Sleep apnea, and Vitamin D deficiency. here with     ICD-10-CM   1. OSA on CPAP  G47.33 For home use only DME continuous positive airway pressure (CPAP)        [place] is doing very well on CPAP. Compliance report reveals excellent compliance. She was encouraged to continue using CPAP nightly and for greater than 4 hours each night. We will update supply orders. She may consider melatonin ER OTC or resuming trazodone per PCP direction. Healthy lifestyle habits encouraged. She will follow-up with me in 1 year, sooner if needed. She verbalizes understanding and agreement with this plan.   Orders  Placed This Encounter  Procedures   For home use only DME continuous positive airway pressure (CPAP)    Supplies    Order Specific Question:   Length of Need    Answer:   Lifetime    Order Specific Question:   Patient has OSA or probable OSA    Answer:   Yes    Order Specific Question:   Is the patient currently using CPAP in the home    Answer:   Yes    Order Specific Question:   Settings    Answer:   Other see comments    Order Specific Question:   CPAP supplies needed    Answer:   Mask, headgear, cushions, filters, heated tubing and water chamber      No orders of the defined types were placed in this encounter.       Debbora Presto, FNP-C 12/26/2021, 8:29 AM [facility] [id number]  [address] [place], [place] [address] [phone]

## 2021-12-26 ENCOUNTER — Ambulatory Visit: Payer: 59 | Admitting: Family Medicine

## 2021-12-26 ENCOUNTER — Encounter: Payer: Self-pay | Admitting: Family Medicine

## 2021-12-26 VITALS — BP 144/88 | HR 73 | Ht 61.0 in | Wt 263.5 lb

## 2021-12-26 DIAGNOSIS — G4733 Obstructive sleep apnea (adult) (pediatric): Secondary | ICD-10-CM | POA: Diagnosis not present

## 2021-12-27 ENCOUNTER — Telehealth: Payer: Self-pay

## 2021-12-28 ENCOUNTER — Ambulatory Visit: Payer: 59 | Admitting: Family Medicine

## 2022-01-05 ENCOUNTER — Other Ambulatory Visit: Payer: Self-pay | Admitting: Cardiology

## 2022-01-17 ENCOUNTER — Other Ambulatory Visit: Payer: Self-pay | Admitting: Cardiology

## 2022-01-23 ENCOUNTER — Encounter: Payer: Self-pay | Admitting: Internal Medicine

## 2022-01-23 ENCOUNTER — Ambulatory Visit: Payer: 59 | Admitting: Internal Medicine

## 2022-01-23 VITALS — BP 114/70 | HR 75 | Ht 61.5 in | Wt 265.0 lb

## 2022-01-23 DIAGNOSIS — K580 Irritable bowel syndrome with diarrhea: Secondary | ICD-10-CM

## 2022-01-23 DIAGNOSIS — K219 Gastro-esophageal reflux disease without esophagitis: Secondary | ICD-10-CM | POA: Diagnosis not present

## 2022-01-23 MED ORDER — HYOSCYAMINE SULFATE ER 0.375 MG PO TB12: 0.3750 mg | ORAL_TABLET | Freq: Two times a day (BID) | ORAL | 11 refills | Status: DC

## 2022-01-23 NOTE — Progress Notes (Unsigned)
Patricia [name] 56 y.o. 1965-10-07 [id number]  Assessment & Plan:   Encounter Diagnoses  Name Primary?   Irritable bowel syndrome with diarrhea Yes   Gastroesophageal reflux disease, unspecified whether esophagitis present     Overall stable with a couple of episodes per year.  Helped significantly with hyoscyamine 0.375 mg taken daily.  We will continue this and return in 2 years sooner if needed.   I explained how conversion of gastric sleeve to gastric bypass-Roux-en-Y is typically done for refractory reflux with a sleeve procedure.  She does not think her symptoms are that bad and does not want to pursue that route.  CC: Susy Frizzle, MD   Subjective:   Chief Complaint: Follow-up of IBS  HPI 56 year old African-American woman with obesity, status post gastric sleeve surgery, chronic abdominal pain and IBS here for follow-up.  She will reports she has done well overall hyoscyamine 0.375 mg is taken daily with benefit.  About twice a year she will have episodes of cramps and diarrhea that are self-limited.  She was worked up after she had elevated CRP and sed rate in the settings but no signs of inflammatory bowel disease were found as below.  She also has some GERD symptoms intermittently but in general this is controlled by PPI.  She is status post sleeve gastrectomy.  No dysphagia.  Colonoscopy 03/21/2020 - Petechia(e) in the terminal ileum. Biopsied.-Normal - Diverticulosis in the sigmoid colon, in the descending colon and in the ascending colon. - The examination was otherwise normal on direct and retroflexion views. - Biopsies were taken with a cold forceps from the ascending colon, transverse colon, descending colon, sigmoid colon and rectum for evaluation of microscopic colitis.-Normal  CT abdomen and pelvis with contrast 07/20/2020 IMPRESSION: Stable small benign right adrenal adenoma.   Colonic diverticulosis. No radiographic evidence of diverticulitis.    Stable small hiatal hernia.  Previous sleeve gastrectomy.   Stable small epigastric ventral hernia containing only fat.   Colonoscopy February 2019  Diverticulosis only  EGD with normal duodenal biopsies September 2010 Colonoscopy July 2007   normal terminal ileum negative random biopsies   Wt Readings from Last 3 Encounters:  01/23/22 265 lb (120.2 kg)  12/26/21 263 lb 8 oz (119.5 kg)  10/02/21 265 lb (120.2 kg)   Labs 08/18/2021-normal TSH CBC with white count 3.9 otherwise normal, and normal CMET. 10/02/2021-A1c 5.8, normal CBC normal CMET      No Known Allergies Current Meds  Medication Sig   ALPRAZolam (XANAX) 0.5 MG tablet Take 1 tablet (0.5 mg total) by mouth 3 (three) times daily as needed.   atorvastatin (LIPITOR) 10 MG tablet TAKE 1 TABLET BY MOUTH DAILY. PLEASE SCHEDULE APPOINTMENT FOR FUTURE REFILLS. 2ND ATTEMPT   fluticasone (FLONASE) 50 MCG/ACT nasal spray Place 2 sprays into both nostrils daily.   KLOR-CON M20 20 MEQ tablet TAKE 1 TABLET BY MOUTH EVERY DAY   losartan-hydrochlorothiazide (HYZAAR) 100-12.5 MG tablet TAKE 1 TABLET BY MOUTH EVERY DAY   Melatonin 10 MG TABS Take by mouth.   Multiple Vitamin (MULTIVITAMIN) capsule Take 1 capsule by mouth daily.   omeprazole (PRILOSEC) 40 MG capsule TAKE 1 CAPSULE BY MOUTH EVERY DAY   Probiotic Product (PROBIOTIC DAILY PO) Take 1 tablet by mouth daily.   traZODone (DESYREL) 50 MG tablet TAKE 1 TABLET BY MOUTH AT BEDTIME AS NEEDED FOR SLEEP.   Past Medical History:  Diagnosis Date   Arthritis    "spine-DDD-intermittent sciatic pain"   Chronic cervicitis  resolved   Diabetes mellitus without complication (HCC)    diet controlled   GERD (gastroesophageal reflux disease)    Heart murmur    "benign"-routinely sees yearly - Dr. Deretha Emory, cardiology   History of kidney stones    multiple kidney stones-passed.   Hypertension    IBS (irritable bowel syndrome)    controlled with diet   Menorrhagia     resolved s/p Hysterectomy   Obesity    Sleep apnea    cpap used nightly   Vitamin D deficiency    Past Surgical History:  Procedure Laterality Date   BREAST LUMPECTOMY Left    benign hamartoma   CESAREAN SECTION     x1   COLONOSCOPY  10/19/2005   ENDOMETRIAL ABLATION     ESOPHAGOGASTRODUODENOSCOPY  12/21/2008   HYSTEROSCOPY WITH D & C     resection of endometrial polyp   LAPAROSCOPIC GASTRIC SLEEVE RESECTION N/A 03/12/2016   Procedure: LAPAROSCOPIC GASTRIC SLEEVE RESECTION WITH UPPER ENDO;  Surgeon: Arta Bruce Kinsinger, MD;  Location: [facility];  Service: General;  Laterality: N/A;   TUBAL LIGATION     Uterine Polyps Removed     VAGINAL HYSTERECTOMY     fibroids   Social History   Social History Narrative   Married, college education, 2 children    Teacher, early years/pre for [employer]    patient is right handed and consumes green tea daily   Never smoker no tobacco, no drug use, only rare to occasional alcohol   family history includes Cancer in her brother; Coronary artery disease in her mother; Diabetes in her mother; Hypertension in her father and mother; Sudden death in her mother.   Review of Systems As above Objective:   Physical Exam BP 114/70   Pulse 75   Ht 5' 1.5" (1.562 m)   Wt 265 lb (120.2 kg)   BMI 49.26 kg/m  Obese bw NAD Abd soft NT

## 2022-01-23 NOTE — Patient Instructions (Addendum)
Due to recent changes in healthcare laws, you may see the results of your imaging and laboratory studies on MyChart before your provider has had a chance to review them.  We understand that in some cases there may be results that are confusing or concerning to you. Not all laboratory results come back in the same time frame and the provider may be waiting for multiple results in order to interpret others.  Please give [place] 48 hours in order for your provider to thoroughly review all the results before contacting the office for clarification of your results.    We have sent the following medications to your pharmacy for you to pick up at your convenience: Hyoscyamine, we are mailing you [employer] RX cards to try  I appreciate the opportunity to care for you. Silvano Rusk, MD, [facility]

## 2022-03-05 ENCOUNTER — Ambulatory Visit: Payer: 59 | Admitting: Family Medicine

## 2022-03-05 ENCOUNTER — Encounter: Payer: Self-pay | Admitting: Family Medicine

## 2022-03-05 VITALS — BP 128/72 | HR 91 | Ht 61.5 in | Wt 265.0 lb

## 2022-03-05 DIAGNOSIS — R1011 Right upper quadrant pain: Secondary | ICD-10-CM

## 2022-03-05 DIAGNOSIS — R300 Dysuria: Secondary | ICD-10-CM | POA: Diagnosis not present

## 2022-03-05 DIAGNOSIS — N390 Urinary tract infection, site not specified: Secondary | ICD-10-CM | POA: Diagnosis not present

## 2022-03-05 LAB — URINALYSIS, ROUTINE W REFLEX MICROSCOPIC
Bilirubin Urine: NEGATIVE
Glucose, UA: NEGATIVE
Hgb urine dipstick: NEGATIVE
Hyaline Cast: NONE SEEN /LPF
Ketones, ur: NEGATIVE
Leukocytes,Ua: NEGATIVE
Nitrite: POSITIVE — AB
Protein, ur: NEGATIVE
RBC / HPF: NONE SEEN /HPF (ref 0–2)
Specific Gravity, Urine: 1.025 (ref 1.001–1.035)
pH: 5.5 (ref 5.0–8.0)

## 2022-03-05 LAB — MICROSCOPIC MESSAGE

## 2022-03-05 MED ORDER — FLUCONAZOLE 150 MG PO TABS: 150.0000 mg | ORAL_TABLET | ORAL | 0 refills | Status: DC | PRN

## 2022-03-05 NOTE — Progress Notes (Signed)
Subjective:    Patient ID: Patricia [name], female    DOB: 09/01/1965, 56 y.o.   MRN: [id number]  Abdominal Pain  Urinary Tract Infection    06/30/19 Patient is a very pleasant 56 year old African-American female who presents today for complete physical exam.  However she has 2 major concerns.  First she reports a 4-week history of left lower quadrant abdominal pain.  It aches and throbs in her left lower quadrant on a daily basis.  There are no exacerbating or alleviating factors.  She denies any melena or hematochezia although her stool has been darker.  She denies any nausea or vomiting.  She denies any vaginal bleeding or dysuria or hematuria.  She saw her gynecologist who performed a pelvic ultrasound that was "normal".  She also contacted her gastroenterologist who suspects this may be due to low back pain.  The patient states that she is only having "pebbles" whenever she has a bowel movement.  She also reports increasing gas and bloating.  At that time, my plan was: Patient did poorly on her hearing screen bilaterally.  I will consult audiology as the patient is considering possibly using hearing aid.  Her abdominal pain is uncertain.  I believe this is most likely due to IBS or constipation.  I have recommended an abdominal x-ray.  If x-ray confirms constipation, I will treat the patient with Linzess to see if symptoms improve.  If x-ray is clear worsen if symptoms do not improve, I would proceed with a CT scan abdomen pelvis given severity of the pain.  Blood pressure today is well controlled.  Given her history of prediabetes, I will check a CBC, CMP, fasting lipid panel, urine microalbumin, hemoglobin A1c.  Her immunizations are up-to-date.  Colonoscopy was performed 2 years ago was normal and therefore is up-to-date.  Mammogram is up-to-date.  Pap smear is performed by gynecology.  07/20/19   IMPRESSION: 1. No CT evidence for acute intra-abdominal or pelvic abnormality 2. Descending and  sigmoid colon diverticular disease without convincing evidence for acute inflammatory process 3. 3.1 cm right adrenal adenoma 4. Fat containing ventral hernia   Patient is here today to discuss.  She does have a ventral abdominal scar midline due to her hysterectomy.  There may be a slight palpable defect just superior to the umbilicus however she has no tenderness to palpation in that area.  Her pain is usually in the left lower quadrant.  Today her abdomen is soft nondistended with normal bowel sounds.  There is no guarding or rebound.  She has had the right adrenal adenoma at least on CT scan back to 2013.  At that time it was 2.2 cm.  Today it is 3.1.  However she has no symptoms of pheochromocytoma.  She demonstrates no signs or symptoms of Cushing syndrome or hyperaldosteronism.  Her sodium and potassium levels have been normal and her blood pressure is well controlled.  Therefore I do not feel that we need to go through an exhaustive endocrinology work-up.  I believe this is a benign adenoma that is relatively stable.  At that time, my plan was:  Adenoma has grown 1 cm over the last 8 years.  There are no signs or symptoms of hormone excess.  Therefore I feel no further work-up is necessary other than clinical monitoring.  She does have a ventral hernia but it is small and asymptomatic.  I believe her lower abdominal pain is either due to referred pain from her back,  irritable bowel syndrome, or possible abdominal adhesions.  At the present time the patient feels reassured that there are no life-threatening causes of her abdominal pain seen on the work-up thus far and is comfortable just living with the pain for the time being.  Spent 25 minutes today with the patient reviewing her CT scan and answering her questions.  03/05/22 Patient is a very pleasant 56 year old African-American female who is a widow.  She is no longer sexually active.  She has a history of a hysterectomy.  She denies any  vaginal bleeding.  She has been treated for 4 separate urinary tract infections over the last 2 months.  This has been done at an urgent care as well as the oncologist.  She states that in every case they performed a urine culture which confirmed the presence of E. coli.  On 1 occasion they had to change her antibiotics due to sensitivity.  She has been treated with 10 days of Cipro on 2 separate occasions.  Therefore, she has culture confirmed urinary tract infections with antibiotics based on sensitivities and has been treated for the appropriate duration however the urinary tract infection improves for short period of time and then comes back.  This prompted her to make today's appointment.  She also has developed right upper quadrant abdominal pain.  The pain is located right below her right breast.  The pain occurs randomly and is intense.  It radiates into her right shoulder blade.  She denies any association with food.  The pain seems to come and go and will.  However it is very intense when it happens.  Third she states that she is taking so many antibiotics she has developed a yeast infection. Past Medical History:  Diagnosis Date   Arthritis    "spine-DDD-intermittent sciatic pain"   Chronic cervicitis    resolved   Diabetes mellitus without complication (HCC)    diet controlled   GERD (gastroesophageal reflux disease)    Heart murmur    "benign"-routinely sees yearly - Dr. Deretha Emory, cardiology   History of kidney stones    multiple kidney stones-passed.   Hypertension    IBS (irritable bowel syndrome)    controlled with diet   Menorrhagia    resolved s/p Hysterectomy   Obesity    Sleep apnea    cpap used nightly   Vitamin D deficiency    Past Surgical History:  Procedure Laterality Date   BREAST LUMPECTOMY Left    benign hamartoma   CESAREAN SECTION     x1   COLONOSCOPY  10/19/2005   ENDOMETRIAL ABLATION     ESOPHAGOGASTRODUODENOSCOPY  12/21/2008   HYSTEROSCOPY WITH  D & C     resection of endometrial polyp   LAPAROSCOPIC GASTRIC SLEEVE RESECTION N/A 03/12/2016   Procedure: LAPAROSCOPIC GASTRIC SLEEVE RESECTION WITH UPPER ENDO;  Surgeon: Arta Bruce Kinsinger, MD;  Location: [facility];  Service: General;  Laterality: N/A;   TUBAL LIGATION     Uterine Polyps Removed     VAGINAL HYSTERECTOMY     fibroids   Current Outpatient Medications on File Prior to Visit  Medication Sig Dispense Refill   ALPRAZolam (XANAX) 0.5 MG tablet Take 1 tablet (0.5 mg total) by mouth 3 (three) times daily as needed. 30 tablet 0   atorvastatin (LIPITOR) 10 MG tablet TAKE 1 TABLET BY MOUTH DAILY. PLEASE SCHEDULE APPOINTMENT FOR FUTURE REFILLS. 2ND ATTEMPT 90 tablet 2   fluticasone (FLONASE) 50 MCG/ACT nasal spray Place 2 sprays  into both nostrils daily. 16 g 6   hyoscyamine (LEVBID) 0.375 MG 12 hr tablet Take 1 tablet (0.375 mg total) by mouth 2 (two) times daily. 60 tablet 11   KLOR-CON M20 20 MEQ tablet TAKE 1 TABLET BY MOUTH EVERY DAY 30 tablet 8   losartan-hydrochlorothiazide (HYZAAR) 100-12.5 MG tablet TAKE 1 TABLET BY MOUTH EVERY DAY 30 tablet 11   Melatonin 10 MG TABS Take by mouth.     Multiple Vitamin (MULTIVITAMIN) capsule Take 1 capsule by mouth daily.     omeprazole (PRILOSEC) 40 MG capsule TAKE 1 CAPSULE BY MOUTH EVERY DAY 30 capsule 11   Probiotic Product (PROBIOTIC DAILY PO) Take 1 tablet by mouth daily.     traZODone (DESYREL) 50 MG tablet TAKE 1 TABLET BY MOUTH AT BEDTIME AS NEEDED FOR SLEEP. 90 tablet 2   No current facility-administered medications on file prior to visit.   No Known Allergies Social History   Socioeconomic History   Marital status: Widowed    Spouse name: Montine Circle   Number of children: 2   Years of education: 16   Highest education level: Not on file  Occupational History   Occupation: QUALITY ANALYST    Employer: [employer]  Tobacco Use   Smoking status: Never   Smokeless tobacco: Never  Vaping Use   Vaping Use: Never used   Substance and Sexual Activity   Alcohol use: Yes    Alcohol/week: 0.0 standard drinks of alcohol    Comment: socially   Drug use: No   Sexual activity: Yes    Birth control/protection: Surgical  Other Topics Concern   Not on file  Social History Narrative   Married, college education, 2 children    Teacher, early years/pre for [employer]    patient is right handed and consumes green tea daily   Never smoker no tobacco, no drug use, only rare to occasional alcohol   Social Determinants of Radio broadcast assistant Strain: Not on file  Food Insecurity: Not on file  Transportation Needs: Not on file  Physical Activity: Not on file  Stress: Not on file  Social Connections: Not on file  Intimate Partner Violence: Not on file   Family History  Problem Relation Age of Onset   Hypertension Father    Hypertension Mother    Diabetes Mother    Coronary artery disease Mother    Sudden death Mother    Cancer Brother        carcinoid tumer of the appendix   Colon cancer Neg Hx    Esophageal cancer Neg Hx    Stomach cancer Neg Hx    Rectal cancer Neg Hx       Review of Systems  Gastrointestinal:  Positive for abdominal pain.  All other systems reviewed and are negative.      Objective:   Physical Exam Vitals reviewed.  Constitutional:      General: She is not in acute distress.    Appearance: She is well-developed. She is not diaphoretic.  HENT:     Head: Normocephalic and atraumatic.     Right Ear: External ear normal.     Left Ear: External ear normal.     Nose: Nose normal.     Mouth/Throat:     Pharynx: No oropharyngeal exudate.  Eyes:     General: No scleral icterus.       Right eye: No discharge.        Left eye: No discharge.  Conjunctiva/sclera: Conjunctivae normal.     Pupils: Pupils are equal, round, and reactive to light.  Neck:     Thyroid: No thyromegaly.     Vascular: No JVD.     Trachea: No tracheal deviation.  Cardiovascular:     Rate and  Rhythm: Normal rate and regular rhythm.     Heart sounds: Normal heart sounds. No murmur heard.    No friction rub. No gallop.  Pulmonary:     Effort: Pulmonary effort is normal. No respiratory distress.     Breath sounds: Normal breath sounds. No stridor. No wheezing or rales.  Chest:     Chest wall: No tenderness.  Abdominal:     General: Bowel sounds are normal. There is no distension.     Palpations: Abdomen is soft. There is no mass.     Tenderness: There is no abdominal tenderness. There is no guarding or rebound.     Hernia: No hernia is present.  Musculoskeletal:     Cervical back: Normal range of motion and neck supple.  Lymphadenopathy:     Cervical: No cervical adenopathy.  Skin:    General: Skin is warm.     Coloration: Skin is not pale.     Findings: No erythema or rash.  Neurological:     Mental Status: She is alert and oriented to person, place, and time.     Cranial Nerves: No cranial nerve deficit.     Sensory: No sensory deficit.     Motor: No abnormal muscle tone.     Coordination: Coordination normal.     Deep Tendon Reflexes: Reflexes normal.  Psychiatric:        Behavior: Behavior normal.        Thought Content: Thought content normal.           Assessment & Plan:  Dysuria - Plan: Urinalysis, Routine w reflex microscopic  RUQ pain - Plan: US Abdomen Complete  Recurrent UTI Patient has had 4 recurrent urinary tract infections in the last 2 months.  Today will be the fifth.  She reports dysuria and frequency and urgency however her urinalysis today shows positive nitrates but negative leukocyte esterase and negative blood negative glucose.  I will await for culture and sensitivity before determining antibiotics.  I do believe it is appropriate to consult urology given her frequency and symptoms.  I believe that she would have a cystoscopy to rule out any bladder diverticulum, bladder stones, or possible fistula that could be causing recurrent urinary  tract infections.  The other possibility would be atrophic vaginitis causing recurrent urinary tract infections but I feel that she requires a cystoscopy first.  Regarding the right upper quadrant pain, I would recommend a ultrasound to evaluate for gallstones.  I will treat her yeast infection with Diflucan

## 2022-03-05 NOTE — Addendum Note (Signed)
Addended by: Randal Buba K on: 03/05/2022 02:51 PM   Modules accepted: Orders

## 2022-03-07 LAB — URINE CULTURE
MICRO NUMBER:: 14297347
SPECIMEN QUALITY:: ADEQUATE

## 2022-03-08 ENCOUNTER — Other Ambulatory Visit: Payer: Self-pay | Admitting: Family Medicine

## 2022-03-08 MED ORDER — AMOXICILLIN-POT CLAVULANATE 875-125 MG PO TABS: 1.0000 | ORAL_TABLET | Freq: Two times a day (BID) | ORAL | 0 refills | Status: DC

## 2022-03-21 ENCOUNTER — Ambulatory Visit
Admission: RE | Admit: 2022-03-21 | Discharge: 2022-03-21 | Disposition: A | Discharge status: Home / Self Care | Payer: 59 | Source: Ambulatory Visit | Attending: Family Medicine | Admitting: Family Medicine

## 2022-03-21 DIAGNOSIS — R300 Dysuria: Secondary | ICD-10-CM

## 2022-03-21 DIAGNOSIS — R1011 Right upper quadrant pain: Secondary | ICD-10-CM

## 2022-03-29 ENCOUNTER — Ambulatory Visit: Payer: 59 | Admitting: Urology

## 2022-03-29 VITALS — BP 123/77 | HR 88

## 2022-03-29 DIAGNOSIS — N390 Urinary tract infection, site not specified: Secondary | ICD-10-CM | POA: Diagnosis not present

## 2022-03-29 LAB — BLADDER SCAN AMB NON-IMAGING: Scan Result: 0

## 2022-03-29 MED ORDER — NITROFURANTOIN MACROCRYSTAL 50 MG PO CAPS: 50.0000 mg | ORAL_CAPSULE | Freq: Every day | ORAL | 0 refills | Status: DC

## 2022-03-29 NOTE — Progress Notes (Signed)
Pt here today for bladder scan. Bladder was scanned and 0 was visualized.    Performed by Neel Buffone, CMA  

## 2022-03-29 NOTE — Progress Notes (Signed)
Subjective: 1. Urinary tract infection without hematuria, site unspecified      Consult requested by Dr. Jenna Luo.  Patricia [name] is a 57 yo female who is sent for recurrent UTI's with 3-4 episodes in the last 3 months.  She went to urgent care initially.  Her last anitbiotic was Augmentin which she finished 2 days ago.  She had been given 5 antibiotics altogether.  She had cipro in late September with sensitive on culture.  Her UA is clear today.   She had a renal US on 12/27 and it showed a small simple cyst and a right adrenal adenoma which has been present and stable.  She has no symptoms currently.  Her symptoms include frequency and dysuria.  Her culture on 12/11 grew e. Coli that was resistant to ampicillin, cipro, levaquin and sulfa but sensitive to augmentin.   She was treated for enterococcus in June.  She had normal renal function on her last CMP in 5/26.   ROS:  ROS  No Known Allergies  Past Medical History:  Diagnosis Date   Arthritis    "spine-DDD-intermittent sciatic pain"   Chronic cervicitis    resolved   Diabetes mellitus without complication (HCC)    diet controlled   GERD (gastroesophageal reflux disease)    Heart murmur    "benign"-routinely sees yearly - Dr. Deretha Emory, cardiology   History of kidney stones    multiple kidney stones-passed.   Hypertension    IBS (irritable bowel syndrome)    controlled with diet   Menorrhagia    resolved s/p Hysterectomy   Obesity    Sleep apnea    cpap used nightly   Vitamin D deficiency     Past Surgical History:  Procedure Laterality Date   BREAST LUMPECTOMY Left    benign hamartoma   CESAREAN SECTION     x1   COLONOSCOPY  10/19/2005   ENDOMETRIAL ABLATION     ESOPHAGOGASTRODUODENOSCOPY  12/21/2008   HYSTEROSCOPY WITH D & C     resection of endometrial polyp   LAPAROSCOPIC GASTRIC SLEEVE RESECTION N/A 03/12/2016   Procedure: LAPAROSCOPIC GASTRIC SLEEVE RESECTION WITH UPPER ENDO;  Surgeon: Arta Bruce  Kinsinger, MD;  Location: [facility];  Service: General;  Laterality: N/A;   TUBAL LIGATION     Uterine Polyps Removed     VAGINAL HYSTERECTOMY     fibroids    Social History   Socioeconomic History   Marital status: Widowed    Spouse name: Montine Circle   Number of children: 2   Years of education: 16   Highest education level: Not on file  Occupational History   Occupation: QUALITY ANALYST    Employer: [employer]  Tobacco Use   Smoking status: Never   Smokeless tobacco: Never  Vaping Use   Vaping Use: Never used  Substance and Sexual Activity   Alcohol use: Yes    Alcohol/week: 0.0 standard drinks of alcohol    Comment: socially   Drug use: No   Sexual activity: Yes    Birth control/protection: Surgical  Other Topics Concern   Not on file  Social History Narrative   Married, college education, 2 children    Teacher, early years/pre for [employer]    patient is right handed and consumes green tea daily   Never smoker no tobacco, no drug use, only rare to occasional alcohol   Social Determinants of Radio broadcast assistant Strain: Not on file  Food Insecurity: Not on file  Transportation  Needs: Not on file  Physical Activity: Not on file  Stress: Not on file  Social Connections: Not on file  Intimate Partner Violence: Not on file    Family History  Problem Relation Age of Onset   Hypertension Father    Hypertension Mother    Diabetes Mother    Coronary artery disease Mother    Sudden death Mother    Cancer Brother        carcinoid tumer of the appendix   Colon cancer Neg Hx    Esophageal cancer Neg Hx    Stomach cancer Neg Hx    Rectal cancer Neg Hx     Anti-infectives: Anti-infectives (From admission, onward)    Start     Dose/Rate Route Frequency Ordered Stop   03/29/22 0000  nitrofurantoin (MACRODANTIN) 50 MG capsule        50 mg Oral Daily at bedtime 03/29/22 1556         Current Outpatient Medications  Medication Sig Dispense Refill    atorvastatin (LIPITOR) 10 MG tablet TAKE 1 TABLET BY MOUTH DAILY. PLEASE SCHEDULE APPOINTMENT FOR FUTURE REFILLS. 2ND ATTEMPT 90 tablet 2   fluticasone (FLONASE) 50 MCG/ACT nasal spray Place 2 sprays into both nostrils daily. 16 g 6   hyoscyamine (LEVBID) 0.375 MG 12 hr tablet Take 1 tablet (0.375 mg total) by mouth 2 (two) times daily. 60 tablet 11   KLOR-CON M20 20 MEQ tablet TAKE 1 TABLET BY MOUTH EVERY DAY 30 tablet 8   losartan-hydrochlorothiazide (HYZAAR) 100-12.5 MG tablet TAKE 1 TABLET BY MOUTH EVERY DAY 30 tablet 11   Melatonin 10 MG TABS Take by mouth.     Multiple Vitamin (MULTIVITAMIN) capsule Take 1 capsule by mouth daily.     nitrofurantoin (MACRODANTIN) 50 MG capsule Take 1 capsule (50 mg total) by mouth at bedtime. 90 capsule 0   omeprazole (PRILOSEC) 40 MG capsule TAKE 1 CAPSULE BY MOUTH EVERY DAY 30 capsule 11   Probiotic Product (PROBIOTIC DAILY PO) Take 1 tablet by mouth daily.     No current facility-administered medications for this visit.     Objective: Vital signs in last 24 hours: BP 123/77   Pulse 88   Intake/Output from previous day: No intake/output data recorded. Intake/Output this shift: '@IOTHISSHIFT'$ @   Physical Exam Vitals reviewed.  Constitutional:      Appearance: Normal appearance. She is obese.  Cardiovascular:     Rate and Rhythm: Normal rate and regular rhythm.     Heart sounds: Normal heart sounds.  Pulmonary:     Effort: Pulmonary effort is normal. No respiratory distress.     Breath sounds: Normal breath sounds.  Abdominal:     Palpations: Abdomen is soft.     Tenderness: There is no abdominal tenderness.  Musculoskeletal:        General: Normal range of motion.  Skin:    General: Skin is warm and dry.  Neurological:     General: No focal deficit present.     Mental Status: She is alert and oriented to person, place, and time.  Psychiatric:        Mood and Affect: Mood normal.        Behavior: Behavior normal.     Lab  Results:  Results for orders placed or performed in visit on 03/29/22 (from the past 24 hour(s))  Urinalysis, Routine w reflex microscopic     Status: Abnormal   Collection Time: 03/29/22  3:00 PM  Result Value Ref Range  Specific Gravity, UA 1.030 1.005 - 1.030   pH, UA 5.0 5.0 - 7.5   Color, UA Yellow Yellow   Appearance Ur Cloudy (A) Clear   Leukocytes,UA Negative Negative   Protein,UA Trace Negative/Trace   Glucose, UA Negative Negative   Ketones, UA Negative Negative   RBC, UA Negative Negative   Bilirubin, UA Negative Negative   Urobilinogen, Ur 0.2 0.2 - 1.0 mg/dL   Nitrite, UA Negative Negative   Microscopic Examination Comment    Narrative   Performed at:  [facility] [address] [place], [place]  [id number] Lab Director: [place], Phone:  [phone]    BMET No results for input(s): "NA", "K", "CL", "CO2", "GLUCOSE", "BUN", "CREATININE", "CALCIUM" in the last 72 hours. PT/INR No results for input(s): "LABPROT", "INR" in the last 72 hours. ABG No results for input(s): "PHART", "HCO3" in the last 72 hours.  Invalid input(s): "PCO2", "PO2"  Studies/Results: No results found. [place] report reviewed.  Outside records and cultures reviewed.  Assessment/Plan: Recurrent UTI's.   Her UA is clear today and a renal US showed no concerning findings.  I am going to put her on nitrofurantoin '50mg'$  night for 3 months to give her bladder a chance to heal.    Side effects reviewed.   Meds ordered this encounter  Medications   nitrofurantoin (MACRODANTIN) 50 MG capsule    Sig: Take 1 capsule (50 mg total) by mouth at bedtime.    Dispense:  90 capsule    Refill:  0     Orders Placed This Encounter  Procedures   Urinalysis, Routine w reflex microscopic   BLADDER SCAN AMB NON-IMAGING     Return in about 4 months (around 07/28/2022).    CC: Dr. Jenna Luo.      Irine Seal 03/30/2022

## 2022-03-30 ENCOUNTER — Encounter: Payer: Self-pay | Admitting: Urology

## 2022-03-30 LAB — URINALYSIS, ROUTINE W REFLEX MICROSCOPIC
Bilirubin, UA: NEGATIVE
Glucose, UA: NEGATIVE
Ketones, UA: NEGATIVE
Leukocytes,UA: NEGATIVE
Nitrite, UA: NEGATIVE
RBC, UA: NEGATIVE
Specific Gravity, UA: 1.03 (ref 1.005–1.030)
Urobilinogen, Ur: 0.2 mg/dL (ref 0.2–1.0)
pH, UA: 5 (ref 5.0–7.5)

## 2022-05-08 ENCOUNTER — Ambulatory Visit: Payer: 59 | Admitting: Family Medicine

## 2022-05-08 ENCOUNTER — Encounter: Payer: Self-pay | Admitting: Family Medicine

## 2022-05-08 VITALS — BP 122/78 | HR 77 | Temp 97.8°F | Ht 61.5 in | Wt 264.0 lb

## 2022-05-08 DIAGNOSIS — R300 Dysuria: Secondary | ICD-10-CM | POA: Diagnosis not present

## 2022-05-08 DIAGNOSIS — R7303 Prediabetes: Secondary | ICD-10-CM | POA: Diagnosis not present

## 2022-05-08 LAB — URINALYSIS, ROUTINE W REFLEX MICROSCOPIC
Bilirubin Urine: NEGATIVE
Glucose, UA: NEGATIVE
Hgb urine dipstick: NEGATIVE
Ketones, ur: NEGATIVE
Leukocytes,Ua: NEGATIVE
Nitrite: NEGATIVE
Protein, ur: NEGATIVE
Specific Gravity, Urine: 1.025 (ref 1.001–1.035)
pH: 6 (ref 5.0–8.0)

## 2022-05-08 MED ORDER — GABAPENTIN 100 MG PO CAPS: 100.0000 mg | ORAL_CAPSULE | Freq: Three times a day (TID) | ORAL | 3 refills | Status: DC | PRN

## 2022-05-08 MED ORDER — ZEPBOUND 2.5 MG/0.5ML ~~LOC~~ SOAJ: 2.5000 mg | SUBCUTANEOUS | 1 refills | Status: DC

## 2022-05-08 MED ORDER — ALPRAZOLAM 0.5 MG PO TABS
0.5000 mg | ORAL_TABLET | Freq: Every evening | ORAL | 0 refills | Status: AC | PRN
Start: 2022-05-08 — End: ?

## 2022-05-08 NOTE — Progress Notes (Signed)
Subjective:    Patient ID: Patricia [name], female    DOB: [birth year], 57 y.o.   MRN: [id number] Patient presents today complaining of itching in both of her breast.  She also reports shooting nervelike pain in both of her breast.  Itching and nervelike pain radiates in a bandlike fashion around her chest area towards her back.  There is no visible rash.  Physical exam was performed with a chaperone present.  There is no rash.  There are no papules.  There are no vesicles.  There is no erythema.  There is no evidence of Candida intertrigo underneath the breast.  There is no palpable mass.  There is no lymphadenopathy in the axilla.  Denies any recent shingles.  However the itching and burning and getting pain sounds pathic in nature.  This is diagrammed on physical exam.  The patient is interested in using zepbound for weight loss.  She has a history of prediabetes it is 264 pounds.  She is overdue for fasting lab work.  She is trying to exercise but has been unsuccessful in achieving weight loss simply with diet and exercise.    Past Medical History:  Diagnosis Date   Arthritis    "spine-DDD-intermittent sciatic pain"   Chronic cervicitis    resolved   Diabetes mellitus without complication (HCC)    diet controlled   GERD (gastroesophageal reflux disease)    Heart murmur    "benign"-routinely sees yearly - Dr. Deretha Emory, cardiology   History of kidney stones    multiple kidney stones-passed.   Hypertension    IBS (irritable bowel syndrome)    controlled with diet   Menorrhagia    resolved s/p Hysterectomy   Obesity    Sleep apnea    cpap used nightly   Vitamin D deficiency    Past Surgical History:  Procedure Laterality Date   BREAST LUMPECTOMY Left    benign hamartoma   CESAREAN SECTION     x1   COLONOSCOPY  10/19/2005   ENDOMETRIAL ABLATION     ESOPHAGOGASTRODUODENOSCOPY  12/21/2008   HYSTEROSCOPY WITH D & C     resection of endometrial polyp   LAPAROSCOPIC  GASTRIC SLEEVE RESECTION N/A 03/12/2016   Procedure: LAPAROSCOPIC GASTRIC SLEEVE RESECTION WITH UPPER ENDO;  Surgeon: Arta Bruce Kinsinger, MD;  Location: [facility];  Service: General;  Laterality: N/A;   TUBAL LIGATION     Uterine Polyps Removed     VAGINAL HYSTERECTOMY     fibroids   Current Outpatient Medications on File Prior to Visit  Medication Sig Dispense Refill   atorvastatin (LIPITOR) 10 MG tablet TAKE 1 TABLET BY MOUTH DAILY. PLEASE SCHEDULE APPOINTMENT FOR FUTURE REFILLS. 2ND ATTEMPT 90 tablet 2   fluticasone (FLONASE) 50 MCG/ACT nasal spray Place 2 sprays into both nostrils daily. 16 g 6   hyoscyamine (LEVBID) 0.375 MG 12 hr tablet Take 1 tablet (0.375 mg total) by mouth 2 (two) times daily. 60 tablet 11   KLOR-CON M20 20 MEQ tablet TAKE 1 TABLET BY MOUTH EVERY DAY 30 tablet 8   losartan-hydrochlorothiazide (HYZAAR) 100-12.5 MG tablet TAKE 1 TABLET BY MOUTH EVERY DAY 30 tablet 11   Melatonin 10 MG TABS Take by mouth.     Multiple Vitamin (MULTIVITAMIN) capsule Take 1 capsule by mouth daily.     nitrofurantoin (MACRODANTIN) 50 MG capsule Take 1 capsule (50 mg total) by mouth at bedtime. 90 capsule 0   omeprazole (PRILOSEC) 40 MG capsule TAKE 1 CAPSULE BY  MOUTH EVERY DAY 30 capsule 11   Probiotic Product (PROBIOTIC DAILY PO) Take 1 tablet by mouth daily.     No current facility-administered medications on file prior to visit.   No Known Allergies Social History   Socioeconomic History   Marital status: Widowed    Spouse name: Montine Circle   Number of children: 2   Years of education: 16   Highest education level: Not on file  Occupational History   Occupation: QUALITY ANALYST    Employer: [employer]  Tobacco Use   Smoking status: Never   Smokeless tobacco: Never  Vaping Use   Vaping Use: Never used  Substance and Sexual Activity   Alcohol use: Yes    Alcohol/week: 0.0 standard drinks of alcohol    Comment: socially   Drug use: No   Sexual activity: Yes    Birth  control/protection: Surgical  Other Topics Concern   Not on file  Social History Narrative   Married, college education, 2 children    Teacher, early years/pre for [employer]    patient is right handed and consumes green tea daily   Never smoker no tobacco, no drug use, only rare to occasional alcohol   Social Determinants of Radio broadcast assistant Strain: Not on file  Food Insecurity: Not on file  Transportation Needs: Not on file  Physical Activity: Not on file  Stress: Not on file  Social Connections: Not on file  Intimate Partner Violence: Not on file   Family History  Problem Relation Age of Onset   Hypertension Father    Hypertension Mother    Diabetes Mother    Coronary artery disease Mother    Sudden death Mother    Cancer Brother        carcinoid tumer of the appendix   Colon cancer Neg Hx    Esophageal cancer Neg Hx    Stomach cancer Neg Hx    Rectal cancer Neg Hx       Review of Systems  Gastrointestinal:  Positive for abdominal pain.  All other systems reviewed and are negative.      Objective:   Physical Exam Vitals reviewed.  Constitutional:      General: She is not in acute distress.    Appearance: She is well-developed. She is not diaphoretic.  HENT:     Head: Normocephalic and atraumatic.     Right Ear: External ear normal.     Left Ear: External ear normal.     Nose: Nose normal.     Mouth/Throat:     Pharynx: No oropharyngeal exudate.  Eyes:     General: No scleral icterus.       Right eye: No discharge.        Left eye: No discharge.     Conjunctiva/sclera: Conjunctivae normal.     Pupils: Pupils are equal, round, and reactive to light.  Neck:     Thyroid: No thyromegaly.     Vascular: No JVD.     Trachea: No tracheal deviation.  Cardiovascular:     Rate and Rhythm: Normal rate and regular rhythm.     Heart sounds: Normal heart sounds. No murmur heard.    No friction rub. No gallop.  Pulmonary:     Effort: Pulmonary effort is  normal. No respiratory distress.     Breath sounds: Normal breath sounds. No stridor. No wheezing or rales.  Chest:     Chest wall: No tenderness.    Abdominal:  General: Bowel sounds are normal. There is no distension.     Palpations: Abdomen is soft. There is no mass.     Tenderness: There is no abdominal tenderness. There is no guarding or rebound.     Hernia: No hernia is present.  Musculoskeletal:     Cervical back: Normal range of motion and neck supple.  Lymphadenopathy:     Cervical: No cervical adenopathy.  Skin:    General: Skin is warm.     Coloration: Skin is not pale.     Findings: No erythema or rash. Rash is not macular, papular, pustular, scaling, urticarial or vesicular.  Neurological:     Mental Status: She is alert and oriented to person, place, and time.     Cranial Nerves: No cranial nerve deficit.     Sensory: No sensory deficit.     Motor: No abnormal muscle tone.     Coordination: Coordination normal.     Deep Tendon Reflexes: Reflexes normal.  Psychiatric:        Behavior: Behavior normal.        Thought Content: Thought content normal.           Assessment & Plan:  Dysuria - Plan: Urinalysis, Routine w reflex microscopic  Prediabetes - Plan: ALPRAZolam (XANAX) 0.5 MG tablet, Hemoglobin A1c, CBC with Differential/Platelet, Lipid panel, COMPLETE METABOLIC PANEL WITH GFR  Morbid obesity ([place]) Patient is currently on nitrofurantoin for urinary tract infection prophylaxis.  She repeated a urinalysis today because she felt she had some dysuria.  There is no leukocyte esterase, there is no hematuria, there is no nitrates.  Therefore there is no evidence of urinary tract infection.  Patient's physical exam shows no indication for reason for the itching on her skin.  There is no rash.  She lives alone.  No one else is itching around her.  There is evidence of any shingles or scabies or eczema.  Therefore I believe this may be neuropathic from thoracic  degenerative disc disease.  Will try the patient on gabapentin 100 mg every 8 hours as needed for nervelike itching and see if her symptoms improve.  Check a CBC a CMP a lipid panel and an A1c pertaining to her prediabetes and we will try the patient on zepbound 2.5 mg subcu weekly and uptitrate as tolerated.  I refilled the Xanax that she uses sparingly for insomnia

## 2022-05-09 LAB — CBC WITH DIFFERENTIAL/PLATELET
Absolute Monocytes: 391 cells/uL (ref 200–950)
Basophils Absolute: 28 cells/uL (ref 0–200)
Basophils Relative: 0.5 %
Eosinophils Absolute: 39 cells/uL (ref 15–500)
Eosinophils Relative: 0.7 %
HCT: 39.4 % (ref 35.0–45.0)
Hemoglobin: 13.6 g/dL (ref 11.7–15.5)
Lymphs Abs: 1865 cells/uL (ref 850–3900)
MCH: 29.8 pg (ref 27.0–33.0)
MCHC: 34.5 g/dL (ref 32.0–36.0)
MCV: 86.4 fL (ref 80.0–100.0)
MPV: 10.3 fL (ref 7.5–12.5)
Monocytes Relative: 7.1 %
Neutro Abs: 3179 cells/uL (ref 1500–7800)
Neutrophils Relative %: 57.8 %
Platelets: 252 10*3/uL (ref 140–400)
RBC: 4.56 10*6/uL (ref 3.80–5.10)
RDW: 13.2 % (ref 11.0–15.0)
Total Lymphocyte: 33.9 %
WBC: 5.5 10*3/uL (ref 3.8–10.8)

## 2022-05-09 LAB — COMPLETE METABOLIC PANEL WITH GFR
AG Ratio: 1.4 (calc) (ref 1.0–2.5)
ALT: 17 U/L (ref 6–29)
AST: 16 U/L (ref 10–35)
Albumin: 4.1 g/dL (ref 3.6–5.1)
Alkaline phosphatase (APISO): 86 U/L (ref 37–153)
BUN: 13 mg/dL (ref 7–25)
CO2: 27 mmol/L (ref 20–32)
Calcium: 9.8 mg/dL (ref 8.6–10.4)
Chloride: 104 mmol/L (ref 98–110)
Creat: 0.61 mg/dL (ref 0.50–1.03)
Globulin: 3 g/dL (calc) (ref 1.9–3.7)
Glucose, Bld: 88 mg/dL (ref 65–99)
Potassium: 3.9 mmol/L (ref 3.5–5.3)
Sodium: 142 mmol/L (ref 135–146)
Total Bilirubin: 0.4 mg/dL (ref 0.2–1.2)
Total Protein: 7.1 g/dL (ref 6.1–8.1)
eGFR: 106 mL/min/{1.73_m2} (ref >=60)

## 2022-05-09 LAB — LIPID PANEL
Cholesterol: 181 mg/dL (ref <200)
HDL: 82 mg/dL (ref >=50)
LDL Cholesterol (Calc): 82 mg/dL (calc)
Non-HDL Cholesterol (Calc): 99 mg/dL (calc) (ref <130)
Total CHOL/HDL Ratio: 2.2 (calc) (ref <5.0)
Triglycerides: 86 mg/dL (ref <150)

## 2022-05-09 LAB — HEMOGLOBIN A1C
Hgb A1c MFr Bld: 6.2 % of total Hgb — ABNORMAL HIGH (ref <5.7)
Mean Plasma Glucose: 131 mg/dL
eAG (mmol/L): 7.3 mmol/L

## 2022-05-14 ENCOUNTER — Other Ambulatory Visit: Payer: Self-pay | Admitting: Family Medicine

## 2022-07-10 ENCOUNTER — Other Ambulatory Visit: Payer: Self-pay

## 2022-07-10 ENCOUNTER — Ambulatory Visit: Payer: 59 | Admitting: Internal Medicine

## 2022-07-10 ENCOUNTER — Encounter: Payer: Self-pay | Admitting: Internal Medicine

## 2022-07-10 ENCOUNTER — Encounter: Payer: Self-pay | Admitting: Family Medicine

## 2022-07-10 VITALS — BP 130/82 | HR 75 | Ht 61.5 in | Wt 271.0 lb

## 2022-07-10 DIAGNOSIS — R635 Abnormal weight gain: Secondary | ICD-10-CM

## 2022-07-10 DIAGNOSIS — R1013 Epigastric pain: Secondary | ICD-10-CM | POA: Diagnosis not present

## 2022-07-10 DIAGNOSIS — Z903 Acquired absence of stomach [part of]: Secondary | ICD-10-CM

## 2022-07-10 NOTE — Progress Notes (Unsigned)
Patricia [name] 56 y.o. Aug 19, 1965 [id number]  Assessment & Plan:   Encounter Diagnoses  Name Primary?   Epigastric pain Yes   Severe obesity (BMI >= 40)    Weight gain    History of sleeve gastrectomy    Epigastric pain of unclear etiology in a patient status post sleeve gastrectomy.  The clinical scenario does not suggest serious problem but I think it merits an EGD.  She understands and agrees.The risks and benefits as well as alternatives of endoscopic procedure(s) have been discussed and reviewed. All questions answered. The patient agrees to proceed.   Weight gain on top of existing obesity despite sleeve after death of husband 2 years ago, emotional component to eating is clearly an issue she understands and accepts that.  She is considering changing to gastric bypass.  Also wondering about GLP-1 medications.  They may not be available in the current insurance climate.  I told her they are not effective while on medication, for sure.  There are multitude of side effects more so in some people, and in my opinion we do not have good enough long-term data about these and people tend to gain weight back when stopping.  So I am not in favor though not opposed.  I am going to see if I can find a food addiction therapist in this area.  Will discuss more at EGD.  CC: Donita Brooks, MD   Subjective:   Chief Complaint: Epigastric pain  HPI 57 year old woman with IBS-D, also has severe obesity despite sleeve gastrectomy, who presents with complaints of epigastric pain for several months.  She has these sharp pains that are relatively fleeting.  Starts in the epigastrium radiates to the left upper quadrant and is not associated with eating necessarily.  Does not seem to disturb sleep.  Began late last year.  Her PCP ordered an ultrasound of the abdomen that was negative.  She saw Dr. Sheliah Hatch recently and he thought that it would be appropriate to have an EGD.  She has gained weight  on top of existing obesity, since her husband died 2 years ago.  He was diagnosed with pancreatic cancer and lives just a few weeks.  It was quite a shock and she is still grieving.  She is eating more related to that and except that she is an emotional eater.  She is not having heartburn or indigestion on omeprazole.  Wt Readings from Last 3 Encounters:  07/10/22 271 lb (122.9 kg)  05/08/22 264 lb (119.7 kg)  03/05/22 265 lb (120.2 kg)  October 2022 266 pounds June 2021 253 pounds February 2020-247 pounds February 2019 231 pounds No Known Allergies Current Meds  Medication Sig   ALPRAZolam (XANAX) 0.5 MG tablet Take 1 tablet (0.5 mg total) by mouth at bedtime as needed for anxiety or sleep.   atorvastatin (LIPITOR) 10 MG tablet TAKE 1 TABLET BY MOUTH DAILY. PLEASE SCHEDULE APPOINTMENT FOR FUTURE REFILLS. 2ND ATTEMPT   fluticasone (FLONASE) 50 MCG/ACT nasal spray Place 2 sprays into both nostrils daily.   gabapentin (NEURONTIN) 100 MG capsule Take 1 capsule (100 mg total) by mouth 3 (three) times daily as needed (nerve pain).   hyoscyamine (LEVBID) 0.375 MG 12 hr tablet Take 1 tablet (0.375 mg total) by mouth 2 (two) times daily.   KLOR-CON M20 20 MEQ tablet TAKE 1 TABLET BY MOUTH EVERY DAY   losartan-hydrochlorothiazide (HYZAAR) 100-12.5 MG tablet TAKE 1 TABLET BY MOUTH EVERY DAY   Melatonin 10 MG  TABS Take by mouth.   Multiple Vitamin (MULTIVITAMIN) capsule Take 1 capsule by mouth daily.   nitrofurantoin (MACRODANTIN) 50 MG capsule Take 1 capsule (50 mg total) by mouth at bedtime.   omeprazole (PRILOSEC) 40 MG capsule TAKE 1 CAPSULE BY MOUTH EVERY DAY   Probiotic Product (PROBIOTIC DAILY PO) Take 1 tablet by mouth daily.   Past Medical History:  Diagnosis Date   Arthritis    "spine-DDD-intermittent sciatic pain"   Chronic cervicitis    resolved   Diabetes mellitus without complication    diet controlled   GERD (gastroesophageal reflux disease)    Heart murmur     "benign"-routinely sees yearly - Dr. Wayne Sever, cardiology   History of kidney stones    multiple kidney stones-passed.   Hypertension    IBS (irritable bowel syndrome)    controlled with diet   Menorrhagia    resolved s/p Hysterectomy   Obesity    Sleep apnea    cpap used nightly   Vitamin D deficiency    Past Surgical History:  Procedure Laterality Date   BREAST LUMPECTOMY Left    benign hamartoma   CESAREAN SECTION     x1   COLONOSCOPY  10/19/2005   ENDOMETRIAL ABLATION     ESOPHAGOGASTRODUODENOSCOPY  12/21/2008   HYSTEROSCOPY WITH D & C     resection of endometrial polyp   LAPAROSCOPIC GASTRIC SLEEVE RESECTION N/A 03/12/2016   Procedure: LAPAROSCOPIC GASTRIC SLEEVE RESECTION WITH UPPER ENDO;  Surgeon: De Blanch Kinsinger, MD;  Location: [facility];  Service: General;  Laterality: N/A;   TUBAL LIGATION     Uterine Polyps Removed     VAGINAL HYSTERECTOMY     fibroids   Social History   Social History Narrative   Widowed-2022, college education, 2 children    Nurse, 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   Review of Systems See HPI  Objective:   Physical Exam  130/82   Pulse 75   Ht 5' 1.5" (1.562 m)   Wt 271 lb (122.9 kg)   BMI 50.38 kg/m @  General:  NAD, severe obese black woman Eyes:   anicteric Lungs:  clear Heart::  S1S2 no rubs, murmurs or gallops Abdomen:  soft and nontender, BS+ chest wall is nontender as well Ext:   no edema, cyanosis or clubbing    Data Reviewed:  Please see the HPI

## 2022-07-10 NOTE — Patient Instructions (Signed)
_______________________________________________________  If your blood pressure at your visit was 140/90 or greater, please contact your primary care physician to follow up on this.  _______________________________________________________  If you are age 56 or older, your body mass index should be between 23-30. Your Body mass index is 50.38 kg/m. If this is out of the aforementioned range listed, please consider follow up with your Primary Care Provider.  If you are age 48 or younger, your body mass index should be between 19-25. Your Body mass index is 50.38 kg/m. If this is out of the aformentioned range listed, please consider follow up with your Primary Care Provider.   ________________________________________________________  The [facility] providers would like to encourage you to use [facility] to communicate with providers for non-urgent requests or questions.  Due to long hold times on the telephone, sending your provider a message by [facility] may be a faster and more efficient way to get a response.  Please allow 48 business hours for a response.  Please remember that this is for non-urgent requests.  _______________________________________________________  Patricia [name] have been scheduled for an endoscopy. Please follow written instructions given to you at your visit today. If you use inhalers (even only as needed), please bring them with you on the day of your procedure.  Due to recent changes in healthcare laws, you may see the results of your imaging and laboratory studies on MyChart before your provider has had a chance to review them.  We understand that in some cases there may be results that are confusing or concerning to you. Not all laboratory results come back in the same time frame and the provider may be waiting for multiple results in order to interpret others.  Please give [place] 48 hours in order for your provider to thoroughly review all the results before contacting the office for  clarification of your results.   It was a pleasure to see you today!  Thank you for trusting me with your gastrointestinal care!

## 2022-07-11 ENCOUNTER — Encounter: Payer: Self-pay | Admitting: Internal Medicine

## 2022-07-12 ENCOUNTER — Other Ambulatory Visit: Payer: Self-pay | Admitting: Family Medicine

## 2022-07-12 MED ORDER — TIRZEPATIDE 2.5 MG/0.5ML ~~LOC~~ SOAJ: 2.5000 mg | SUBCUTANEOUS | 1 refills | Status: DC

## 2022-07-19 ENCOUNTER — Ambulatory Visit: Payer: 59 | Admitting: Orthopedic Surgery

## 2022-07-19 DIAGNOSIS — M25561 Pain in right knee: Secondary | ICD-10-CM | POA: Diagnosis not present

## 2022-07-19 DIAGNOSIS — G8929 Other chronic pain: Secondary | ICD-10-CM | POA: Diagnosis not present

## 2022-07-20 ENCOUNTER — Telehealth: Payer: Self-pay | Admitting: Internal Medicine

## 2022-07-20 NOTE — Telephone Encounter (Signed)
Patient called and stated that she needs to reschedule her procedure at [facility] on 05/21. Please advise, thank you.

## 2022-07-24 NOTE — Telephone Encounter (Signed)
OK 

## 2022-07-24 NOTE — Telephone Encounter (Signed)
Pt stated that she needed to reschedule her EGD that was scheduled in the Hospital with Dr. Leone Payor on 08/14/2022: Pt was rescheduled to 10/08/2022 at 7:30 AM with Dr. Leone Payor at [facility]. Pt made aware.  Pt scheduled for a previsit on 09/12/2022 8:00 PM. Pt made aware. Location provided. Pt verbalized understanding with all questions answered.

## 2022-07-24 NOTE — Telephone Encounter (Signed)
Please see note below and advise on open availability

## 2022-07-24 NOTE — Telephone Encounter (Signed)
Left message for pt to call back  °

## 2022-07-25 ENCOUNTER — Telehealth: Payer: Self-pay

## 2022-07-25 NOTE — Telephone Encounter (Signed)
PA for Mounjaro:  Thurmon Fair (Key: [id number])  Your information has been sent to [facility].

## 2022-07-26 NOTE — Telephone Encounter (Signed)
Received notice that this PA has been denied.

## 2022-07-29 ENCOUNTER — Encounter: Payer: Self-pay | Admitting: Orthopedic Surgery

## 2022-07-29 DIAGNOSIS — G8929 Other chronic pain: Secondary | ICD-10-CM

## 2022-07-29 DIAGNOSIS — M25561 Pain in right knee: Secondary | ICD-10-CM | POA: Diagnosis not present

## 2022-07-29 MED ORDER — METHYLPREDNISOLONE ACETATE 40 MG/ML IJ SUSP
40.0000 mg | INTRAMUSCULAR | Status: AC | PRN
Start: 2022-07-29 — End: 2022-07-29
  Administered 2022-07-29: 40 mg via INTRA_ARTICULAR

## 2022-07-29 MED ORDER — LIDOCAINE HCL (PF) 1 % IJ SOLN
5.0000 mL | INTRAMUSCULAR | Status: AC | PRN
Start: 2022-07-29 — End: 2022-07-29
  Administered 2022-07-29: 5 mL

## 2022-07-29 NOTE — Progress Notes (Signed)
Office Visit Note   Patient: Patricia [name]           Date of Birth: 1965/05/04           MRN: [id number] Visit Date: 07/19/2022              Requested by: Donita Brooks, MD [address] [place],  [place] [address] PCP: Donita Brooks, MD  Chief Complaint  Patient presents with   Right Knee - Pain      HPI: Patient is a 57 year old woman who presents with recurrent chronic right knee pain.  She has been seen by Dr. Magnus Ivan in the past.  She states she had a steroid injection over a year ago.  She has been to physical therapy.  Patient states she is trying to exercise more and lose weight.  Assessment & Plan: Visit Diagnoses:  1. Chronic pain of right knee     Plan: Right knee was injected she tolerated this well she will continue with her exercises and weight loss.  Follow-Up Instructions: Return if symptoms worsen or fail to improve.   Ortho Exam  Patient is alert, oriented, no adenopathy, well-dressed, normal affect, normal respiratory effort. Examination of the right knee there is no redness no cellulitis there is a minimal effusion collaterals and cruciates are stable she is tender to palpation over the medial joint line  Imaging: No results found. No images are attached to the encounter.  Labs: Lab Results  Component Value Date   HGBA1C 6.2 (H) 05/08/2022   HGBA1C 5.8 (H) 10/02/2021   HGBA1C 5.7 (H) 07/07/2020   ESRSEDRATE 38 (H) 01/05/2020   ESRSEDRATE 61 Repeated and verified X2. (H) 08/31/2019   CRP 33.6 (H) 01/05/2020   CRP 3.7 08/31/2019   LABURIC 5.6 11/12/2011   LABORGA No Salmonella,Shigella,Campylobacter,Yersinia,or 06/10/2012   LABORGA No E.coli 0157:H7 isolated. 06/10/2012   LABORGA NO GROWTH 06/10/2012     Lab Results  Component Value Date   ALBUMIN 3.8 08/18/2021   ALBUMIN 4.1 11/11/2017   ALBUMIN 4.0 03/13/2016    No results found for: "MG" Lab Results  Component Value Date   VD25OH 40.0 11/11/2017    No results  found for: "PREALBUMIN"    Latest Ref Rng & Units 05/08/2022   12:42 PM 10/02/2021    8:46 AM 08/18/2021    9:27 AM  CBC EXTENDED  WBC 3.8 - 10.8 Thousand/uL 5.5  4.1  3.9   RBC 3.80 - 5.10 Million/uL 4.56  4.69  4.56   Hemoglobin 11.7 - 15.5 g/dL 19.1  47.8  29.5   HCT 35.0 - 45.0 % 39.4  41.6  41.5   Platelets 140 - 400 Thousand/uL 252  246  228   NEUT# 1,500 - 7,800 cells/uL 3,179  2,038    Lymph# 850 - 3,900 cells/uL 1,865  1,550       There is no height or weight on file to calculate BMI.  Orders:  No orders of the defined types were placed in this encounter.  No orders of the defined types were placed in this encounter.    Procedures: Large Joint Inj: R knee on 07/29/2022 8:31 AM Indications: pain and diagnostic evaluation Details: 22 G 1.5 in needle, anteromedial approach  Arthrogram: No  Medications: 5 mL lidocaine (PF) 1 %; 40 mg methylPREDNISolone acetate 40 MG/ML Outcome: tolerated well, no immediate complications Procedure, treatment alternatives, risks and benefits explained, specific risks discussed. Consent was given by the patient.  Immediately prior to procedure a time out was called to verify the correct patient, procedure, equipment, support staff and site/side marked as required. Patient was prepped and draped in the usual sterile fashion.      Clinical Data: No additional findings.  ROS:  All other systems negative, except as noted in the HPI. Review of Systems  Objective: Vital Signs: There were no vitals taken for this visit.  Specialty Comments:  No specialty comments available.  PMFS History: Patient Active Problem List   Diagnosis Date Noted   Obesity 03/12/2016   Palpitations 07/07/2013   Acute sinusitis 04/21/2013   Obstructive sleep apnea (adult) (pediatric) 08/25/2012   Hypersomnia with sleep apnea, unspecified 08/25/2012   Bilateral lower extremity edema 11/27/2011   Obesity, Class III, BMI 40-49.9 (morbid obesity) (HCC)  10/26/2011   Essential hypertension 11/28/2008   IRRITABLE BOWEL SYNDROME - diarrhea predominant 11/10/2008   Past Medical History:  Diagnosis Date   Arthritis    "spine-DDD-intermittent sciatic pain"   Chronic cervicitis    resolved   Diabetes mellitus without complication (HCC)    diet controlled   GERD (gastroesophageal reflux disease)    Heart murmur    "benign"-routinely sees yearly - Dr. Wayne Sever, cardiology   History of kidney stones    multiple kidney stones-passed.   Hypertension    IBS (irritable bowel syndrome)    controlled with diet   Menorrhagia    resolved s/p Hysterectomy   Obesity    Sleep apnea    cpap used nightly   Vitamin D deficiency     Family History  Problem Relation Age of Onset   Hypertension Father    Hypertension Mother    Diabetes Mother    Coronary artery disease Mother    Sudden death Mother    Cancer Brother        carcinoid tumer of the appendix   Colon cancer Neg Hx    Esophageal cancer Neg Hx    Stomach cancer Neg Hx    Rectal cancer Neg Hx     Past Surgical History:  Procedure Laterality Date   BREAST LUMPECTOMY Left    benign hamartoma   CESAREAN SECTION     x1   COLONOSCOPY  10/19/2005   ENDOMETRIAL ABLATION     ESOPHAGOGASTRODUODENOSCOPY  12/21/2008   HYSTEROSCOPY WITH D & C     resection of endometrial polyp   LAPAROSCOPIC GASTRIC SLEEVE RESECTION N/A 03/12/2016   Procedure: LAPAROSCOPIC GASTRIC SLEEVE RESECTION WITH UPPER ENDO;  Surgeon: De Blanch Kinsinger, MD;  Location: [facility];  Service: General;  Laterality: N/A;   TUBAL LIGATION     Uterine Polyps Removed     VAGINAL HYSTERECTOMY     fibroids   Social History   Occupational History   Occupation: QUALITY ANALYST    Employer: [employer]  Tobacco Use   Smoking status: Never   Smokeless tobacco: Never  Vaping Use   Vaping Use: Never used  Substance and Sexual Activity   Alcohol use: Yes    Alcohol/week: 0.0 standard drinks of alcohol     Comment: socially   Drug use: No   Sexual activity: Yes    Birth control/protection: Surgical

## 2022-08-01 ENCOUNTER — Ambulatory Visit: Payer: 59 | Attending: Cardiology | Admitting: Cardiology

## 2022-08-01 ENCOUNTER — Encounter: Payer: Self-pay | Admitting: Cardiology

## 2022-08-01 VITALS — BP 118/70 | HR 62 | Ht 61.5 in | Wt 270.6 lb

## 2022-08-01 DIAGNOSIS — R0789 Other chest pain: Secondary | ICD-10-CM | POA: Diagnosis not present

## 2022-08-01 DIAGNOSIS — I10 Essential (primary) hypertension: Secondary | ICD-10-CM | POA: Diagnosis not present

## 2022-08-01 DIAGNOSIS — E782 Mixed hyperlipidemia: Secondary | ICD-10-CM

## 2022-08-01 DIAGNOSIS — R002 Palpitations: Secondary | ICD-10-CM | POA: Diagnosis not present

## 2022-08-01 MED ORDER — ATORVASTATIN CALCIUM 10 MG PO TABS: ORAL_TABLET | ORAL | 2 refills | Status: DC

## 2022-08-01 NOTE — Patient Instructions (Signed)
Medication Instructions:  Your physician recommends that you continue on your current medications as directed. Please refer to the Current Medication list given to you today.  *If you need a refill on your cardiac medications before your next appointment, please call your pharmacy*   Lab Work: None If you have labs (blood work) drawn today and your tests are completely normal, you will receive your results only by: MyChart Message (if you have MyChart) OR A paper copy in the mail If you have any lab test that is abnormal or we need to change your treatment, we will call you to review the results.   Testing/Procedures: None   Follow-Up: At [facility], you and your health needs are our priority.  As part of our continuing mission to provide you with exceptional heart care, we have created designated Provider Care Teams.  These Care Teams include your primary Cardiologist (physician) and Advanced Practice Providers (APPs -  Physician Assistants and Nurse Practitioners) who all work together to provide you with the care you need, when you need it.  We recommend signing up for the patient portal called "MyChart".  Sign up information is provided on this After Visit Summary.  MyChart is used to connect with patients for Virtual Visits (Telemedicine).  Patients are able to view lab/test results, encounter notes, upcoming appointments, etc.  Non-urgent messages can be sent to your provider as well.   To learn more about what you can do with MyChart, go to [email or web address].    Your next appointment:   1 year(s)  Provider:   Jonathan Branch, MD    Other Instructions    

## 2022-08-01 NOTE — Progress Notes (Signed)
Clinical Summary Ms. Latvala is a 57 y.o.female seen today for follow up of the following medical probems.      1. Palpitations   - previous 7 day event monitor showed no signficant arrhythmias - denies any recent symptoms.      2. OSA - followed by neuro Dr Frances Furbish - remains compliant with cpap     3. SOB  - echo 09/2015 LVEF 60-65%, normal diastolic function.    02/2020 coronary CTA calcium score 0, mild nonobstructive plaque  - breathing had improved significantly with prior weight loss. With recurrent weight gain has had some recurrent SOB      4. Severe obesity - s/p bariatric surgery  - had significant weight loss initially - recent weight gain related to stress from recent passing of her husband 2 years ago - reports insurance denying moujaro    5. Chest pain - 02/2020 coronary CTA calcium score 0, mild nonobstructive plaque - no recent chest pains  6. 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 Graduated from [employer], active alumnus Husband passed pancreatic cancer 2 years ago Past Medical History:  Diagnosis Date   Arthritis    "spine-DDD-intermittent sciatic pain"   Chronic cervicitis    resolved   Diabetes mellitus without complication (HCC)    diet controlled   GERD (gastroesophageal reflux disease)    Heart murmur    "benign"-routinely sees yearly - Dr. Wayne Sever, cardiology   History of kidney stones    multiple kidney stones-passed.   Hypertension    IBS (irritable bowel syndrome)    controlled with diet   Menorrhagia    resolved s/p Hysterectomy   Obesity    Sleep apnea    cpap used nightly   Vitamin D deficiency      No Known Allergies   Current Outpatient Medications  Medication Sig Dispense Refill   tirzepatide (MOUNJARO) 2.5 MG/0.5ML Pen Inject 2.5 mg into the skin once a week. 2 mL 1   ALPRAZolam (XANAX) 0.5 MG  tablet Take 1 tablet (0.5 mg total) by mouth at bedtime as needed for anxiety or sleep. 30 tablet 0   atorvastatin (LIPITOR) 10 MG tablet TAKE 1 TABLET BY MOUTH DAILY. PLEASE SCHEDULE APPOINTMENT FOR FUTURE REFILLS. 2ND ATTEMPT 90 tablet 2   fluticasone (FLONASE) 50 MCG/ACT nasal spray Place 2 sprays into both nostrils daily. 16 g 6   gabapentin (NEURONTIN) 100 MG capsule Take 1 capsule (100 mg total) by mouth 3 (three) times daily as needed (nerve pain). 90 capsule 3   hyoscyamine (LEVBID) 0.375 MG 12 hr tablet Take 1 tablet (0.375 mg total) by mouth 2 (two) times daily. 60 tablet 11   KLOR-CON M20 20 MEQ tablet TAKE 1 TABLET BY MOUTH EVERY DAY 30 tablet 8   losartan-hydrochlorothiazide (HYZAAR) 100-12.5 MG tablet TAKE 1 TABLET BY MOUTH EVERY DAY 30 tablet 11   Melatonin 10 MG TABS Take by mouth.     Multiple Vitamin (MULTIVITAMIN) capsule Take 1 capsule by mouth daily.     nitrofurantoin (MACRODANTIN) 50 MG capsule Take 1 capsule (50 mg total) by mouth at bedtime. 90 capsule 0   omeprazole (PRILOSEC) 40 MG capsule TAKE 1 CAPSULE BY MOUTH EVERY DAY 30 capsule 11   Probiotic Product (PROBIOTIC DAILY PO) Take 1 tablet by mouth daily.     No current facility-administered medications  for this visit.     Past Surgical History:  Procedure Laterality Date   BREAST LUMPECTOMY Left    benign hamartoma   CESAREAN SECTION     x1   COLONOSCOPY  10/19/2005   ENDOMETRIAL ABLATION     ESOPHAGOGASTRODUODENOSCOPY  12/21/2008   HYSTEROSCOPY WITH D & C     resection of endometrial polyp   LAPAROSCOPIC GASTRIC SLEEVE RESECTION N/A 03/12/2016   Procedure: LAPAROSCOPIC GASTRIC SLEEVE RESECTION WITH UPPER ENDO;  Surgeon: De Blanch Kinsinger, MD;  Location: [facility];  Service: General;  Laterality: N/A;   TUBAL LIGATION     Uterine Polyps Removed     VAGINAL HYSTERECTOMY     fibroids     No Known Allergies    Family History  Problem Relation Age of Onset   Hypertension Father    Hypertension Mother     Diabetes Mother    Coronary artery disease Mother    Sudden death Mother    Cancer Brother        carcinoid tumer of the appendix   Colon cancer Neg Hx    Esophageal cancer Neg Hx    Stomach cancer Neg Hx    Rectal cancer Neg Hx      Social History Ms. Demary reports that she has never smoked. She has never used smokeless tobacco. Ms. Luevano reports current alcohol use.   Review of Systems CONSTITUTIONAL: No weight loss, fever, chills, weakness or fatigue.  HEENT: Eyes: No visual loss, blurred vision, double vision or yellow sclerae.No hearing loss, sneezing, congestion, runny nose or sore throat.  SKIN: No rash or itching.  CARDIOVASCULAR: per hpi RESPIRATORY: per hpi  GASTROINTESTINAL: No anorexia, nausea, vomiting or diarrhea. No abdominal pain or blood.  GENITOURINARY: No burning on urination, no polyuria NEUROLOGICAL: No headache, dizziness, syncope, paralysis, ataxia, numbness or tingling in the extremities. No change in bowel or bladder control.  MUSCULOSKELETAL: No muscle, back pain, joint pain or stiffness.  LYMPHATICS: No enlarged nodes. No history of splenectomy.  PSYCHIATRIC: No history of depression or anxiety.  ENDOCRINOLOGIC: No reports of sweating, cold or heat intolerance. No polyuria or polydipsia.  Marland Kitchen   Physical Examination Today's Vitals   08/01/22 1528  BP: 118/70  Pulse: 62  SpO2: 97%  Weight: 270 lb 9.6 oz (122.7 kg)  Height: 5' 1.5" (1.562 m)   Body mass index is 50.3 kg/m.  Gen: resting comfortably, no acute distress HEENT: no scleral icterus, pupils equal round and reactive, no palptable cervical adenopathy,  CV: RRR, no mrg, no jvd Resp: Clear to auscultation bilaterally GI: abdomen is soft, non-tender, non-distended, normal bowel sounds, no hepatosplenomegaly MSK: extremities are warm, no edema.  Skin: warm, no rash Neuro:  no focal deficits Psych: appropriate affect   Diagnostic Studies 03/2013 Event Monitor 7 Days No symptoms,  normal sinus rhythm   09/2015 echo Study Conclusions   - Left ventricle: The cavity size was normal. Wall thickness was   normal. Systolic function was normal. The estimated ejection   fraction was in the range of 60% to 65%. Wall motion was normal;   there were no regional wall motion abnormalities. Left   ventricular diastolic function parameters were normal for the   patient&'s age. - Right atrium: Central venous pressure (est): 3 mm Hg. - Atrial septum: No defect or patent foramen ovale was identified. - Tricuspid valve: There was mild regurgitation. - Pulmonary arteries: PA peak pressure: 32 mm Hg (S). - Pericardium, extracardiac: There was no  pericardial effusion.   Impressions:   - Normal LV wall thickness with LVEF 60-65%. Normal diastolic   function. Mild tricuspid regurgitation with PASP 32 mmHg.  02/2020 coronary CTA 1. Coronary calcium score of 0. This was 1st percentile for age, sex, and race matched control.   2. Normal coronary origin with right dominance.   3. Ascending aortic atherosclerosis noted.   4. CAD-RADS 2. Mild non-obstructive CAD (25-49%). Consider non-atherosclerotic causes of chest pain. Consider preventive therapy and risk factor modification.    Assessment and Plan  1. Palpitations   - benign event monitor previously - denies recent symptmos, continue to monitor   2. SOB -benign previous cardiac workup - SOB fluctuates with weight gain and loss - no additional testing indicated, she is working on increasing exercise and dietary changes   3. Chest pain - no recent symptoms, benign coronary CTA in 2021 - continue to monitor - EKG NSR, no ischemic changes  4. Hyperlipidemia - at goal, continue current meds   F/u 1 year  Antoine Poche, M.D.

## 2022-08-02 ENCOUNTER — Ambulatory Visit: Payer: 59 | Admitting: Urology

## 2022-08-02 VITALS — BP 110/74 | HR 66 | Ht 61.5 in | Wt 270.6 lb

## 2022-08-02 DIAGNOSIS — N393 Stress incontinence (female) (male): Secondary | ICD-10-CM | POA: Diagnosis not present

## 2022-08-02 DIAGNOSIS — Z87442 Personal history of urinary calculi: Secondary | ICD-10-CM | POA: Diagnosis not present

## 2022-08-02 DIAGNOSIS — Z87440 Personal history of urinary (tract) infections: Secondary | ICD-10-CM

## 2022-08-02 LAB — URINALYSIS, ROUTINE W REFLEX MICROSCOPIC
Bilirubin, UA: NEGATIVE
Glucose, UA: NEGATIVE
Ketones, UA: NEGATIVE
Leukocytes,UA: NEGATIVE
Nitrite, UA: NEGATIVE
RBC, UA: NEGATIVE
Specific Gravity, UA: 1.02 (ref 1.005–1.030)
Urobilinogen, Ur: 0.2 mg/dL (ref 0.2–1.0)
pH, UA: 7 (ref 5.0–7.5)

## 2022-08-02 LAB — MICROSCOPIC EXAMINATION: Bacteria, UA: NONE SEEN

## 2022-08-02 NOTE — Progress Notes (Signed)
Subjective: 1. Personal history of urinary (tract) infections   2. Stress incontinence of urine   3. History of nephrolithiasis        08/02/22: Patricia [name] completed 3 months of suppressive nitrofurantoin a month ago and she remains symptom free.  She has a clear urine today.  She has mild SUI and is still doing Kegels.  She has had no stone symptoms.   03/29/22: Patricia [name] is a 57 yo female who is sent for recurrent UTI's with 3-4 episodes in the last 3 months.  She went to urgent care initially.  Her last anitbiotic was Augmentin which she finished 2 days ago.  She had been given 5 antibiotics altogether.  She had cipro in late September with sensitive on culture.  Her UA is clear today.   She had a renal US on 12/27 and it showed a small simple cyst and a right adrenal adenoma which has been present and stable.  She has no symptoms currently.  Her symptoms include frequency and dysuria.  Her culture on 12/11 grew e. Coli that was resistant to ampicillin, cipro, levaquin and sulfa but sensitive to augmentin.   She was treated for enterococcus in June.  She had normal renal function on her last CMP in 5/26.   ROS:  Review of Systems  Gastrointestinal:  Positive for constipation, diarrhea and heartburn.  Endo/Heme/Allergies:  Bruises/bleeds easily.    No Known Allergies  Past Medical History:  Diagnosis Date   Arthritis    "spine-DDD-intermittent sciatic pain"   Chronic cervicitis    resolved   Diabetes mellitus without complication (HCC)    diet controlled   GERD (gastroesophageal reflux disease)    Heart murmur    "benign"-routinely sees yearly - Dr. Wayne Sever, cardiology   History of kidney stones    multiple kidney stones-passed.   Hypertension    IBS (irritable bowel syndrome)    controlled with diet   Menorrhagia    resolved s/p Hysterectomy   Obesity    Sleep apnea    cpap used nightly   Vitamin D deficiency     Past Surgical History:  Procedure Laterality Date    BREAST LUMPECTOMY Left    benign hamartoma   CESAREAN SECTION     x1   COLONOSCOPY  10/19/2005   ENDOMETRIAL ABLATION     ESOPHAGOGASTRODUODENOSCOPY  12/21/2008   HYSTEROSCOPY WITH D & C     resection of endometrial polyp   LAPAROSCOPIC GASTRIC SLEEVE RESECTION N/A 03/12/2016   Procedure: LAPAROSCOPIC GASTRIC SLEEVE RESECTION WITH UPPER ENDO;  Surgeon: De Blanch Kinsinger, MD;  Location: [facility];  Service: General;  Laterality: N/A;   TUBAL LIGATION     Uterine Polyps Removed     VAGINAL HYSTERECTOMY     fibroids    Social History   Socioeconomic History   Marital status: Widowed    Spouse name: Ladene Artist   Number of children: 2   Years of education: 16   Highest education level: Not on file  Occupational History   Occupation: QUALITY ANALYST    Employer: [employer]  Tobacco Use   Smoking status: Never   Smokeless tobacco: Never  Vaping Use   Vaping Use: Never used  Substance and Sexual Activity   Alcohol use: Yes    Alcohol/week: 0.0 standard drinks of alcohol    Comment: socially   Drug use: No   Sexual activity: Yes    Birth control/protection: Surgical  Other Topics Concern   Not on  file  Social History Narrative   Widowed-2022, college education, 2 children    Nurse, 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[employer]    patient is right handed and consumes green tea daily   Never smoker no tobacco, no drug use, only rare to occasional alcohol   Social Determinants of Corporate investment banker Strain: Not on file  Food Insecurity: Not on file  Transportation Needs: Not on file  Physical Activity: Not on file  Stress: Not on file  Social Connections: Not on file  Intimate Partner Violence: Not on file    Family History  Problem Relation Age of Onset   Hypertension Father    Hypertension Mother    Diabetes Mother    Coronary artery disease Mother    Sudden death Mother    Cancer Brother        carcinoid tumer of the appendix   Colon cancer Neg Hx    Esophageal  cancer Neg Hx    Stomach cancer Neg Hx    Rectal cancer Neg Hx     Anti-infectives: Anti-infectives (From admission, onward)    None       Current Outpatient Medications  Medication Sig Dispense Refill   ALPRAZolam (XANAX) 0.5 MG tablet Take 1 tablet (0.5 mg total) by mouth at bedtime as needed for anxiety or sleep. 30 tablet 0   atorvastatin (LIPITOR) 10 MG tablet TAKE 1 TABLET BY MOUTH DAILY. 90 tablet 2   fluticasone (FLONASE) 50 MCG/ACT nasal spray Place 2 sprays into both nostrils daily. 16 g 6   gabapentin (NEURONTIN) 100 MG capsule Take 1 capsule (100 mg total) by mouth 3 (three) times daily as needed (nerve pain). 90 capsule 3   hyoscyamine (LEVBID) 0.375 MG 12 hr tablet Take 1 tablet (0.375 mg total) by mouth 2 (two) times daily. 60 tablet 11   KLOR-CON M20 20 MEQ tablet TAKE 1 TABLET BY MOUTH EVERY DAY 30 tablet 8   losartan-hydrochlorothiazide (HYZAAR) 100-12.5 MG tablet TAKE 1 TABLET BY MOUTH EVERY DAY 30 tablet 11   Melatonin 10 MG TABS Take by mouth.     Multiple Vitamin (MULTIVITAMIN) capsule Take 1 capsule by mouth daily.     omeprazole (PRILOSEC) 40 MG capsule TAKE 1 CAPSULE BY MOUTH EVERY DAY 30 capsule 11   Probiotic Product (PROBIOTIC DAILY PO) Take 1 tablet by mouth daily.     No current facility-administered medications for this visit.     Objective: Vital signs in last 24 hours: BP 110/74   Pulse 66   Ht 5' 1.5" (1.562 m)   Wt 270 lb 9.6 oz (122.7 kg)   BMI 50.30 kg/m   Intake/Output from previous day: No intake/output data recorded. Intake/Output this shift: @IOTHISSHIFT @   Physical Exam Vitals reviewed.  Constitutional:      Appearance: Normal appearance.  Neurological:     Mental Status: She is alert.     Lab Results:  Results for orders placed or performed in visit on 08/02/22 (from the past 24 hour(s))  Urinalysis, Routine w reflex microscopic     Status: Abnormal   Collection Time: 08/02/22  9:20 AM  Result Value Ref Range    Specific Gravity, UA 1.020 1.005 - 1.030   pH, UA 7.0 5.0 - 7.5   Color, UA Yellow Yellow   Appearance Ur Clear Clear   Leukocytes,UA Negative Negative   Protein,UA 1+ (A) Negative/Trace   Glucose, UA Negative Negative   Ketones, UA Negative Negative   RBC, UA Negative  Negative   Bilirubin, UA Negative Negative   Urobilinogen, Ur 0.2 0.2 - 1.0 mg/dL   Nitrite, UA Negative Negative   Microscopic Examination See below:    Narrative   Performed at:  [address] - [facility] [address] [place], [place]  [id number] Lab Director: Chinita Pester MT, Phone:  [phone]  Microscopic Examination     Status: None   Collection Time: 08/02/22  9:20 AM   Urine  Result Value Ref Range   WBC, UA 0-5 0 - 5 /hpf   RBC, Urine 0-2 0 - 2 /hpf   Epithelial Cells (non renal) 0-10 0 - 10 /hpf   Bacteria, UA None seen None seen/Few   Narrative   Performed at:  [address] - [facility] [address], [place], [place]  [id number] Lab Director: Chinita Pester MT, Phone:  [phone]     BMET No results for input(s): "NA", "K", "CL", "CO2", "GLUCOSE", "BUN", "CREATININE", "CALCIUM" in the last 72 hours. PT/INR No results for input(s): "LABPROT", "INR" in the last 72 hours. ABG No results for input(s): "PHART", "HCO3" in the last 72 hours.  Invalid input(s): "PCO2", "PO2"  Studies/Results: No results found. [place] report reviewed.  Outside records and cultures reviewed.  Assessment/Plan: Recurrent UTI's.   She is asymptomatic with a clear urine 1 month off of suppression.   I will have her return in 6 months.  Stress incontinence.  This is mild and she has previously had PT and will work on the exercises.  History of stones.   She has had no hematuria or flank pain but has had multiple stones int he past.   No orders of the defined types were placed in this encounter.    Orders Placed This Encounter  Procedures   Microscopic Examination   Urinalysis, Routine w reflex microscopic      Return in about 6 months (around 02/02/2023).    CC: Dr. Lynnea Ferrier.      Bjorn Pippin 08/03/2022

## 2022-08-03 ENCOUNTER — Encounter: Payer: Self-pay | Admitting: Urology

## 2022-08-13 ENCOUNTER — Telehealth: Payer: Self-pay | Admitting: *Deleted

## 2022-08-13 NOTE — Telephone Encounter (Signed)
Attempt to reach pt to change pre-visit date. Unable to reach or leave message.

## 2022-08-14 ENCOUNTER — Telehealth: Payer: Self-pay | Admitting: *Deleted

## 2022-08-14 NOTE — Telephone Encounter (Signed)
Attempted to reach pt to reschedule pre-vist to another time, room and or day. LM with call back #

## 2022-08-19 ENCOUNTER — Other Ambulatory Visit: Payer: Self-pay | Admitting: Family Medicine

## 2022-08-21 ENCOUNTER — Telehealth: Payer: Self-pay

## 2022-08-21 ENCOUNTER — Other Ambulatory Visit: Payer: Self-pay

## 2022-08-21 DIAGNOSIS — I10 Essential (primary) hypertension: Secondary | ICD-10-CM

## 2022-08-21 MED ORDER — POTASSIUM CHLORIDE CRYS ER 20 MEQ PO TBCR
20.0000 meq | EXTENDED_RELEASE_TABLET | Freq: Every day | ORAL | 3 refills | Status: DC
Start: 2022-08-21 — End: 2022-12-13

## 2022-08-21 NOTE — Telephone Encounter (Signed)
Requested Prescriptions  Pending Prescriptions Disp Refills   losartan-hydrochlorothiazide (HYZAAR) 100-12.5 MG tablet [Pharmacy Med Name: LOSARTAN-HCTZ 100-12.5 MG TAB] 30 tablet 11    Sig: TAKE 1 TABLET BY MOUTH EVERY DAY     Cardiovascular: ARB + Diuretic Combos Failed - 08/19/2022  1:29 AM      Failed - Valid encounter within last 6 months    Recent Outpatient Visits           1 year ago Benign essential HTN   [facility] Tanya Nones, Priscille Heidelberg, MD   2 years ago Benign essential HTN   [facility] Tanya Nones, Priscille Heidelberg, MD   2 years ago Benign essential HTN   [facility] Donita Brooks, MD   2 years ago Breast pain in female   [facility] Jenne Pane, Aggie Cosier A, FNP   3 years ago Adrenal adenoma, right   [facility] Pickard, Priscille Heidelberg, MD       Future Appointments             In 5 months Bjorn Pippin, MD [facility] [place]            Passed - K in normal range and within 180 days    Potassium  Date Value Ref Range Status  05/08/2022 3.9 3.5 - 5.3 mmol/L Final         Passed - Na in normal range and within 180 days    Sodium  Date Value Ref Range Status  05/08/2022 142 135 - 146 mmol/L Final  11/11/2017 138 134 - 144 mmol/L Final         Passed - Cr in normal range and within 180 days    Creat  Date Value Ref Range Status  05/08/2022 0.61 0.50 - 1.03 mg/dL Final         Passed - eGFR is 10 or above and within 180 days    GFR, Est African American  Date Value Ref Range Status  07/07/2020 111 > OR = 60 mL/min/1.76m2 Final   GFR, Est Non African American  Date Value Ref Range Status  07/07/2020 96 > OR = 60 mL/min/1.37m2 Final   GFR, Estimated  Date Value Ref Range Status  08/18/2021 >60 >60 mL/min Final    Comment:    (NOTE) Calculated using the CKD-EPI Creatinine Equation (2021)    GFR  Date Value Ref Range Status  06/10/2012 107.26 >60.00 mL/min Final   eGFR   Date Value Ref Range Status  05/08/2022 106 > OR = 60 mL/min/1.55m2 Final         Passed - Patient is not pregnant      Passed - Last BP in normal range    BP Readings from Last 1 Encounters:  08/02/22 110/74

## 2022-08-21 NOTE — Telephone Encounter (Signed)
Prescription Request  08/21/2022  LOV:05/08/22  What is the name of the medication or equipment? KLOR-CON M20 20 MEQ tablet [id number]  Have you contacted your pharmacy to request a refill? Yes   Which pharmacy would you like this sent to?  [id number] [place], [place] - [address] [place] [address] Phone: [phone] Fax: [phone]    Patient notified that their request is being sent to the clinical staff for review and that they should receive a response within 2 business days.   Please advise at [facility] [phone]

## 2022-09-06 ENCOUNTER — Telehealth: Payer: Self-pay | Admitting: *Deleted

## 2022-09-06 NOTE — Telephone Encounter (Signed)
Patient is currently scheduled for a [facility] procedure; will note this on the patient's PV chart;

## 2022-09-06 NOTE — Telephone Encounter (Signed)
Yesi,  This pt's BMI is greater than 50; their procedure will need to be performed at the hospital.  Thanks,  Kathlene Yano 

## 2022-09-12 ENCOUNTER — Ambulatory Visit (AMBULATORY_SURGERY_CENTER): Payer: 59

## 2022-09-12 VITALS — Ht 61.5 in | Wt 273.0 lb

## 2022-09-12 DIAGNOSIS — Z903 Acquired absence of stomach [part of]: Secondary | ICD-10-CM

## 2022-09-12 DIAGNOSIS — R635 Abnormal weight gain: Secondary | ICD-10-CM

## 2022-09-12 DIAGNOSIS — R1013 Epigastric pain: Secondary | ICD-10-CM

## 2022-09-12 NOTE — Progress Notes (Signed)
No egg or soy allergy known to patient  No issues known to pt with past sedation with any surgeries or procedures Patient denies ever being told they had issues or difficulty with intubation  No FH of Malignant Hyperthermia Pt is not on diet pills Pt is not on  home 02  Pt is not on blood thinners  Pt denies issues with constipation  No A fib or A flutter Have any cardiac testing pending-- no  LOA: independent   PV completed. Instructions reviewed with patient. Hard copy given at visit.

## 2022-09-19 ENCOUNTER — Encounter: Payer: Self-pay | Admitting: Internal Medicine

## 2022-09-25 ENCOUNTER — Encounter (HOSPITAL_COMMUNITY): Payer: Self-pay | Admitting: Internal Medicine

## 2022-09-28 ENCOUNTER — Encounter: Payer: Self-pay | Admitting: Family Medicine

## 2022-09-28 ENCOUNTER — Ambulatory Visit (INDEPENDENT_AMBULATORY_CARE_PROVIDER_SITE_OTHER): Payer: 59 | Admitting: Family Medicine

## 2022-09-28 VITALS — BP 128/86 | HR 78 | Temp 98.6°F | Ht 61.0 in | Wt 271.0 lb

## 2022-09-28 DIAGNOSIS — R052 Subacute cough: Secondary | ICD-10-CM | POA: Diagnosis not present

## 2022-09-28 DIAGNOSIS — R7303 Prediabetes: Secondary | ICD-10-CM

## 2022-09-28 LAB — CBC WITH DIFFERENTIAL/PLATELET
Basophils Absolute: 18 cells/uL (ref 0–200)
Lymphs Abs: 1593 cells/uL (ref 850–3900)
MCHC: 33.7 g/dL (ref 32.0–36.0)
MCV: 85.9 fL (ref 80.0–100.0)
Neutro Abs: 2489 cells/uL (ref 1500–7800)
Neutrophils Relative %: 55.3 %
Platelets: 260 10*3/uL (ref 140–400)
Total Lymphocyte: 35.4 %
WBC: 4.5 10*3/uL (ref 3.8–10.8)

## 2022-09-28 MED ORDER — HYDROCOD POLI-CHLORPHE POLI ER 10-8 MG/5ML PO SUER: 5.0000 mL | Freq: Two times a day (BID) | ORAL | 0 refills | Status: DC | PRN

## 2022-09-28 MED ORDER — PREDNISONE 20 MG PO TABS: ORAL_TABLET | ORAL | 0 refills | Status: DC

## 2022-09-28 NOTE — Progress Notes (Signed)
Subjective:    Patient ID: Patricia [name], female    DOB: 26-Jun-1965, 57 y.o.   MRN: [id number] Patient was treated at a local urgent care with antibiotics for a sinus infection.  She states that she has had a cough now for over a month.  She scores.  She reports postnasal drip causing her cough.  She denies any fevers or chills.  She denies any chest pain.  She denies any pleurisy.  The cough is nonproductive.  She denies any wheezing or rattling in her chest.  Past Medical History:  Diagnosis Date  . Arthritis    "spine-DDD-intermittent sciatic pain"  . Chronic cervicitis    resolved  . Diabetes mellitus without complication (HCC)    diet controlled  . GERD (gastroesophageal reflux disease)   . Heart murmur    "benign"-routinely sees yearly - Dr. Wayne Sever, cardiology  . History of kidney stones    multiple kidney stones-passed.  . Hypertension   . IBS (irritable bowel syndrome)    controlled with diet  . Menorrhagia    resolved s/p Hysterectomy  . Obesity   . Sleep apnea    cpap used nightly  . Vitamin D deficiency    Past Surgical History:  Procedure Laterality Date  . BREAST LUMPECTOMY Left    benign hamartoma  . CESAREAN SECTION     x1  . COLONOSCOPY  10/19/2005  . ENDOMETRIAL ABLATION    . ESOPHAGOGASTRODUODENOSCOPY  12/21/2008  . HYSTEROSCOPY WITH D & C     resection of endometrial polyp  . LAPAROSCOPIC GASTRIC SLEEVE RESECTION N/A 03/12/2016   Procedure: LAPAROSCOPIC GASTRIC SLEEVE RESECTION WITH UPPER ENDO;  Surgeon: De Blanch Kinsinger, MD;  Location: [facility];  Service: General;  Laterality: N/A;  . TUBAL LIGATION    . Uterine Polyps Removed    . VAGINAL HYSTERECTOMY     fibroids   Current Outpatient Medications on File Prior to Visit  Medication Sig Dispense Refill  . ALPRAZolam (XANAX) 0.5 MG tablet Take 1 tablet (0.5 mg total) by mouth at bedtime as needed for anxiety or sleep. 30 tablet 0  . atorvastatin (LIPITOR) 10 MG tablet TAKE 1 TABLET BY  MOUTH DAILY. 90 tablet 2  . fluticasone (FLONASE) 50 MCG/ACT nasal spray Place 2 sprays into both nostrils daily. 16 g 6  . gabapentin (NEURONTIN) 100 MG capsule Take 1 capsule (100 mg total) by mouth 3 (three) times daily as needed (nerve pain). 90 capsule 3  . hyoscyamine (LEVBID) 0.375 MG 12 hr tablet Take 1 tablet (0.375 mg total) by mouth 2 (two) times daily. 60 tablet 11  . losartan-hydrochlorothiazide (HYZAAR) 100-12.5 MG tablet TAKE 1 TABLET BY MOUTH EVERY DAY 30 tablet 3  . Multiple Vitamin (MULTIVITAMIN) capsule Take 1 capsule by mouth daily.    Marland Kitchen omeprazole (PRILOSEC) 40 MG capsule TAKE 1 CAPSULE BY MOUTH EVERY DAY 30 capsule 11  . potassium chloride SA (KLOR-CON M20) 20 MEQ tablet Take 1 tablet (20 mEq total) by mouth daily. 30 tablet 3  . Probiotic Product (PROBIOTIC DAILY PO) Take 1 tablet by mouth daily.     No current facility-administered medications on file prior to visit.   No Known Allergies Social History   Socioeconomic History  . Marital status: Widowed    Spouse name: Ladene Artist  . Number of children: 2  . Years of education: 47  . Highest education level: Not on file  Occupational History  . Occupation: QUALITY ANALYST    Employer:  [employer]  Tobacco Use  . Smoking status: Never  . Smokeless tobacco: Never  Vaping Use  . Vaping Use: Never used  Substance and Sexual Activity  . Alcohol use: Yes    Alcohol/week: 0.0 standard drinks of alcohol    Comment: socially  . Drug use: No  . Sexual activity: Yes    Birth control/protection: Surgical  Other Topics Concern  . 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 Hypertension Mother   . Diabetes Mother   . Coronary artery disease Mother   . Sudden death Mother   . Hypertension Father   . Cancer Brother        carcinoid tumer of the appendix  . Colon cancer Neg Hx   . Esophageal cancer Neg Hx   . Stomach cancer Neg Hx   . Rectal cancer Neg Hx   . Colon polyps Neg Hx       Review of Systems  Gastrointestinal:  Positive for abdominal pain.  All other systems reviewed and are negative.      Objective:   Physical Exam Vitals reviewed.  Constitutional:      General: She is not in acute distress.    Appearance: She is well-developed. She is not diaphoretic.  HENT:     Head: Normocephalic and atraumatic.     Right Ear: Tympanic membrane, ear canal and external ear normal.     Left Ear: Tympanic membrane, ear canal and external ear normal.     Nose: Rhinorrhea present. No congestion.     Mouth/Throat:     Pharynx: No oropharyngeal exudate.  Eyes:     General: No scleral icterus.       Right eye: No discharge.        Left eye: No discharge.     Conjunctiva/sclera: Conjunctivae normal.     Pupils: Pupils are equal, round, and reactive to light.  Neck:     Thyroid: No thyromegaly.     Vascular: No JVD.     Trachea: No tracheal deviation.  Cardiovascular:     Rate and Rhythm: Normal rate and regular rhythm.     Heart sounds: Normal heart sounds. No murmur heard.    No friction rub. No gallop.  Pulmonary:     Effort: Pulmonary effort is normal. No respiratory distress.     Breath sounds: Normal breath sounds. No stridor. No wheezing or rales.  Chest:     Chest wall: No tenderness.  Abdominal:     General: Bowel sounds are normal. There is no distension.     Palpations: Abdomen is soft. There is no mass.     Tenderness: There is no abdominal  tenderness. There is no guarding or rebound.     Hernia: No hernia is present.  Musculoskeletal:     Cervical back: Normal range of motion and neck supple.  Lymphadenopathy:  Cervical: No cervical adenopathy.  Skin:    General: Skin is warm.     Coloration: Skin is not pale.     Findings: No erythema or rash. Rash is not macular, papular, pustular, scaling, urticarial or vesicular.  Neurological:     Mental Status: She is alert and oriented to person, place, and time.     Cranial Nerves: No cranial nerve deficit.     Sensory: No sensory deficit.     Motor: No abnormal muscle tone.     Coordination: Coordination normal.     Deep Tendon Reflexes: Reflexes normal.  Psychiatric:        Behavior: Behavior normal.        Thought Content: Thought content normal.          Assessment & Plan:  Prediabetes - Plan: CBC with Differential/Platelet, Lipid panel, COMPLETE METABOLIC PANEL WITH GFR  Subacute cough I believe this may be upper airway cough syndrome.  Add prednisone taper pack.  Continue her Flonase and allergy medicine.  Use Tussionex 1 teaspoon every 12 hours as needed for coughing.  Recheck in 2 weeks if no better.

## 2022-09-29 LAB — LIPID PANEL
Cholesterol: 166 mg/dL (ref <200)
HDL: 68 mg/dL (ref >=50)
LDL Cholesterol (Calc): 81 mg/dL (calc)
Non-HDL Cholesterol (Calc): 98 mg/dL (calc) (ref <130)
Total CHOL/HDL Ratio: 2.4 (calc) (ref <5.0)
Triglycerides: 91 mg/dL (ref <150)

## 2022-09-29 LAB — COMPLETE METABOLIC PANEL WITH GFR
AG Ratio: 1.5 (calc) (ref 1.0–2.5)
ALT: 16 U/L (ref 6–29)
AST: 17 U/L (ref 10–35)
Albumin: 4 g/dL (ref 3.6–5.1)
Alkaline phosphatase (APISO): 88 U/L (ref 37–153)
BUN: 17 mg/dL (ref 7–25)
CO2: 27 mmol/L (ref 20–32)
Calcium: 9.6 mg/dL (ref 8.6–10.4)
Chloride: 105 mmol/L (ref 98–110)
Creat: 0.7 mg/dL (ref 0.50–1.03)
Globulin: 2.7 g/dL (calc) (ref 1.9–3.7)
Glucose, Bld: 92 mg/dL (ref 65–99)
Potassium: 4.1 mmol/L (ref 3.5–5.3)
Sodium: 142 mmol/L (ref 135–146)
Total Bilirubin: 0.5 mg/dL (ref 0.2–1.2)
Total Protein: 6.7 g/dL (ref 6.1–8.1)
eGFR: 102 mL/min/{1.73_m2} (ref >=60)

## 2022-09-29 LAB — CBC WITH DIFFERENTIAL/PLATELET
Absolute Monocytes: 293 cells/uL (ref 200–950)
Basophils Relative: 0.4 %
Eosinophils Absolute: 108 cells/uL (ref 15–500)
Eosinophils Relative: 2.4 %
HCT: 38.9 % (ref 35.0–45.0)
Hemoglobin: 13.1 g/dL (ref 11.7–15.5)
MCH: 28.9 pg (ref 27.0–33.0)
MPV: 10.2 fL (ref 7.5–12.5)
Monocytes Relative: 6.5 %
RBC: 4.53 10*6/uL (ref 3.80–5.10)
RDW: 13.1 % (ref 11.0–15.0)

## 2022-10-06 NOTE — Anesthesia Preprocedure Evaluation (Addendum)
Anesthesia Evaluation  Patient identified by MRN, date of birth, ID band Patient awake    Reviewed: Allergy & Precautions, NPO status , Patient's Chart, lab work & pertinent test results  Airway Mallampati: II  TM Distance: >3 FB Neck ROM: Full    Dental  (+) Teeth Intact, Dental Advisory Given, Implants   Pulmonary sleep apnea and Continuous Positive Airway Pressure Ventilation    Pulmonary exam normal breath sounds clear to auscultation       Cardiovascular hypertension, Pt. on medications Normal cardiovascular exam Rhythm:Regular Rate:Normal     Neuro/Psych negative neurological ROS     GI/Hepatic Neg liver ROS,GERD  Medicated,,  Endo/Other  diabetes  Morbid obesity  Renal/GU negative Renal ROS     Musculoskeletal  (+) Arthritis ,    Abdominal   Peds  Hematology negative hematology ROS (+)   Anesthesia Other Findings Day of surgery medications reviewed with the patient.  Reproductive/Obstetrics                             Anesthesia Physical Anesthesia Plan  ASA: 3  Anesthesia Plan: MAC   Post-op Pain Management: Minimal or no pain anticipated   Induction: Intravenous  PONV Risk Score and Plan: 2 and TIVA and Treatment may vary due to age or medical condition  Airway Management Planned: Simple Face Mask and Natural Airway  Additional Equipment:   Intra-op Plan:   Post-operative Plan:   Informed Consent: I have reviewed the patients History and Physical, chart, labs and discussed the procedure including the risks, benefits and alternatives for the proposed anesthesia with the patient or authorized representative who has indicated his/her understanding and acceptance.     Dental advisory given  Plan Discussed with: CRNA  Anesthesia Plan Comments:        Anesthesia Quick Evaluation

## 2022-10-08 ENCOUNTER — Ambulatory Visit (HOSPITAL_COMMUNITY)
Admission: RE | Admit: 2022-10-08 | Discharge: 2022-10-08 | Disposition: A | Discharge status: Home / Self Care | Payer: 59 | Attending: Internal Medicine | Admitting: Internal Medicine

## 2022-10-08 ENCOUNTER — Other Ambulatory Visit: Payer: Self-pay

## 2022-10-08 ENCOUNTER — Encounter (HOSPITAL_COMMUNITY)
Admission: RE | Disposition: A | Discharge status: Home / Self Care | Payer: Self-pay | Source: Home / Self Care | Attending: Internal Medicine

## 2022-10-08 ENCOUNTER — Ambulatory Visit (HOSPITAL_COMMUNITY): Payer: 59 | Admitting: Anesthesiology

## 2022-10-08 ENCOUNTER — Encounter (HOSPITAL_COMMUNITY): Payer: Self-pay | Admitting: Internal Medicine

## 2022-10-08 ENCOUNTER — Ambulatory Visit (HOSPITAL_BASED_OUTPATIENT_CLINIC_OR_DEPARTMENT_OTHER): Payer: 59 | Admitting: Anesthesiology

## 2022-10-08 DIAGNOSIS — Z634 Disappearance and death of family member: Secondary | ICD-10-CM | POA: Insufficient documentation

## 2022-10-08 DIAGNOSIS — K589 Irritable bowel syndrome without diarrhea: Secondary | ICD-10-CM | POA: Diagnosis not present

## 2022-10-08 DIAGNOSIS — E669 Obesity, unspecified: Secondary | ICD-10-CM

## 2022-10-08 DIAGNOSIS — K449 Diaphragmatic hernia without obstruction or gangrene: Secondary | ICD-10-CM | POA: Diagnosis not present

## 2022-10-08 DIAGNOSIS — Z6841 Body Mass Index (BMI) 40.0 and over, adult: Secondary | ICD-10-CM | POA: Diagnosis not present

## 2022-10-08 DIAGNOSIS — G4730 Sleep apnea, unspecified: Secondary | ICD-10-CM | POA: Diagnosis not present

## 2022-10-08 DIAGNOSIS — E119 Type 2 diabetes mellitus without complications: Secondary | ICD-10-CM | POA: Insufficient documentation

## 2022-10-08 DIAGNOSIS — R635 Abnormal weight gain: Secondary | ICD-10-CM

## 2022-10-08 DIAGNOSIS — I10 Essential (primary) hypertension: Secondary | ICD-10-CM | POA: Diagnosis not present

## 2022-10-08 DIAGNOSIS — R1013 Epigastric pain: Secondary | ICD-10-CM | POA: Diagnosis not present

## 2022-10-08 DIAGNOSIS — Z9884 Bariatric surgery status: Secondary | ICD-10-CM | POA: Diagnosis not present

## 2022-10-08 DIAGNOSIS — Z79899 Other long term (current) drug therapy: Secondary | ICD-10-CM | POA: Diagnosis not present

## 2022-10-08 DIAGNOSIS — K219 Gastro-esophageal reflux disease without esophagitis: Secondary | ICD-10-CM | POA: Diagnosis not present

## 2022-10-08 DIAGNOSIS — Z903 Acquired absence of stomach [part of]: Secondary | ICD-10-CM

## 2022-10-08 HISTORY — PX: ESOPHAGOGASTRODUODENOSCOPY (EGD) WITH PROPOFOL: SHX5813

## 2022-10-08 SURGERY — ESOPHAGOGASTRODUODENOSCOPY (EGD) WITH PROPOFOL
Anesthesia: Monitor Anesthesia Care

## 2022-10-08 MED ORDER — PROPOFOL 10 MG/ML IV BOLUS
INTRAVENOUS | Status: DC | PRN
  Administered 2022-10-08: 100 mg via INTRAVENOUS
  Administered 2022-10-08: 20 mg via INTRAVENOUS
  Administered 2022-10-08: 50 mg via INTRAVENOUS

## 2022-10-08 MED ORDER — SODIUM CHLORIDE 0.9 % IV SOLN: INTRAVENOUS | Status: DC

## 2022-10-08 MED ORDER — LACTATED RINGERS IV SOLN
INTRAVENOUS | Status: DC
  Administered 2022-10-08: 1000 mL via INTRAVENOUS

## 2022-10-08 MED ORDER — LIDOCAINE 2% (20 MG/ML) 5 ML SYRINGE
INTRAMUSCULAR | Status: DC | PRN
  Administered 2022-10-08: 60 mg via INTRAVENOUS

## 2022-10-08 MED ORDER — PROPOFOL 500 MG/50ML IV EMUL
INTRAVENOUS | Status: DC | PRN
  Administered 2022-10-08: 100 ug/kg/min via INTRAVENOUS

## 2022-10-08 MED ORDER — PROPOFOL 1000 MG/100ML IV EMUL
INTRAVENOUS | Status: AC
  Filled 2022-10-08: qty 100

## 2022-10-08 SURGICAL SUPPLY — 15 items

## 2022-10-08 NOTE — Discharge Instructions (Addendum)
There is a small hiatal hernia seen (stomach moves into chest a little) but that is not causing your problems, I think. No ulcers, other abnormalities.  Please follow-up with Dr. Sheliah Hatch as planned.  There are food addiction therapists in [place] but I am not familiar with any of them. I will ask some colleagues if they have any recommendations - perhaps Dr. Sheliah Hatch knows some.  I appreciate the opportunity to care for you. Iva Boop, MD, FACG   YOU HAD AN ENDOSCOPIC PROCEDURE TODAY: Refer to the procedure report and other information in the discharge instructions given to you for any specific questions about what was found during the examination. If this information does not answer your questions, please call Dr. Marvell Fuller office at [phone] to clarify.   YOU SHOULD EXPECT: Some feelings of bloating in the abdomen. Passage of more gas than usual. Walking can help get rid of the air that was put into your GI tract during the procedure and reduce the bloating. If you had a lower endoscopy (such as a colonoscopy or flexible sigmoidoscopy) you may notice spotting of blood in your stool or on the toilet paper. Some abdominal soreness may be present for a day or two, also.  DIET: Your first meal following the procedure should be a light meal and then it is ok to progress to your normal diet. A half-sandwich or bowl of soup is an example of a good first meal. Heavy or fried foods are harder to digest and may make you feel nauseous or bloated. Drink plenty of fluids but you should avoid alcoholic beverages for 24 hours.   ACTIVITY: Your care partner should take you home directly after the procedure. You should plan to take it easy, moving slowly for the rest of the day. You can resume normal activity the day after the procedure however YOU SHOULD NOT DRIVE, use power tools, machinery or perform tasks that involve climbing or major physical exertion for 24 hours (because of the sedation  medicines used during the test).   SYMPTOMS TO REPORT IMMEDIATELY: A gastroenterologist can be reached at any hour. Please call [phone]  for any of the following symptoms:   Following upper endoscopy (EGD, EUS, ERCP, esophageal dilation) Vomiting of blood or coffee ground material  New, significant abdominal pain  New, significant chest pain or pain under the shoulder blades  Painful or persistently difficult swallowing  New shortness of breath  Black, tarry-looking or red, bloody stools  FOLLOW UP:  If any biopsies were taken you will be contacted by phone or by letter within the next 1-3 weeks. Call [phone]  if you have not heard about the biopsies in 3 weeks.  Please also call with any specific questions about appointments or follow up tests.

## 2022-10-08 NOTE — Anesthesia Procedure Notes (Signed)
Procedure Name: MAC Date/Time: 10/08/2022 7:39 AM  Performed by: Maurene Capes, CRNAPre-anesthesia Checklist: Patient identified, Emergency Drugs available, Suction available and Patient being monitored Oxygen Delivery Method: Simple face mask Preoxygenation: Pre-oxygenation with 100% oxygen Induction Type: IV induction

## 2022-10-08 NOTE — H&P (Signed)
[facility] History and Physical   Primary Care Physician:  Donita Brooks, MD   Reason for Procedure:   Epigastric pain and heartburn  Plan:    EGD     HPI: Patricia [name] is a 57 y.o. female with IBS-D, also has severe obesity despite sleeve gastrectomy, who presents with complaints of epigastric pain for several months.  She has these sharp pains that are relatively fleeting.  Starts in the epigastrium radiates to the left upper quadrant and is not associated with eating necessarily.  Does not seem to disturb sleep.  Began late last year.  Her PCP ordered an ultrasound of the abdomen that was negative.  She saw Dr. Sheliah Hatch recently and he thought that it would be appropriate to have an EGD.  She has gained weight on top of existing obesity, since her husband died 2 years ago.  He was diagnosed with pancreatic cancer and lives just a few weeks.  It was quite a shock and she is still grieving.  She is eating more related to that and except that she is an emotional eater. Also now having some heartburn issues also.     Past Medical History:  Diagnosis Date   Arthritis    "spine-DDD-intermittent sciatic pain"   Chronic cervicitis    resolved   Diabetes mellitus without complication (HCC)    diet controlled   GERD (gastroesophageal reflux disease)    Heart murmur    "benign"-routinely sees yearly - Dr. Wayne Sever, cardiology   History of kidney stones    multiple kidney stones-passed.   Hypertension    IBS (irritable bowel syndrome)    controlled with diet   Menorrhagia    resolved s/p Hysterectomy   Obesity    Sleep apnea    cpap used nightly   Vitamin D deficiency     Past Surgical History:  Procedure Laterality Date   BREAST LUMPECTOMY Left    benign hamartoma   CESAREAN SECTION     x1   COLONOSCOPY  10/19/2005   ENDOMETRIAL ABLATION     ESOPHAGOGASTRODUODENOSCOPY  12/21/2008   HYSTEROSCOPY WITH D & C     resection of endometrial polyp    LAPAROSCOPIC GASTRIC SLEEVE RESECTION N/A 03/12/2016   Procedure: LAPAROSCOPIC GASTRIC SLEEVE RESECTION WITH UPPER ENDO;  Surgeon: De Blanch Kinsinger, MD;  Location: [facility];  Service: General;  Laterality: N/A;   TUBAL LIGATION     Uterine Polyps Removed     VAGINAL HYSTERECTOMY     fibroids    Prior to Admission medications   Medication Sig Start Date End Date Taking? Authorizing Provider  ALPRAZolam Prudy Feeler) 0.5 MG tablet Take 1 tablet (0.5 mg total) by mouth at bedtime as needed for anxiety or sleep. 05/08/22  Yes Donita Brooks, MD  atorvastatin (LIPITOR) 10 MG tablet TAKE 1 TABLET BY MOUTH DAILY. Patient taking differently: Take 10 mg by mouth every evening. TAKE 1 TABLET BY MOUTH DAILY. 08/01/22  Yes BranchDorothe Pea, MD  fluticasone (FLONASE) 50 MCG/ACT nasal spray Place 2 sprays into both nostrils daily. 12/24/19  Yes Donita Brooks, MD  hyoscyamine (LEVBID) 0.375 MG 12 hr tablet Take 1 tablet (0.375 mg total) by mouth 2 (two) times daily. Patient taking differently: Take 0.375 mg by mouth at bedtime. 01/23/22  Yes Iva Boop, MD  losartan-hydrochlorothiazide (HYZAAR) 100-12.5 MG tablet TAKE 1 TABLET BY MOUTH EVERY DAY Patient taking differently: Take 1 tablet by mouth every evening. 08/21/22  Yes Pickard,  Priscille Heidelberg, MD  Multiple Vitamin (MULTIVITAMIN) capsule Take 1 capsule by mouth daily.   Yes [provider]  omeprazole (PRILOSEC) 40 MG capsule TAKE 1 CAPSULE BY MOUTH EVERY DAY Patient taking differently: Take 40 mg by mouth every evening. 05/15/22  Yes Pickard, Priscille Heidelberg, MD  OVER THE COUNTER MEDICATION Take 1 capsule by mouth daily. Sea Moss Supplement   Yes [provider]  potassium chloride SA (KLOR-CON M20) 20 MEQ tablet Take 1 tablet (20 mEq total) by mouth daily. Patient taking differently: Take 20 mEq by mouth every evening. 08/21/22  Yes Pickard, Priscille Heidelberg, MD  Probiotic Product (PROBIOTIC DAILY PO) Take 1 tablet by mouth daily.   Yes [provider]  chlorpheniramine-HYDROcodone (TUSSIONEX) 10-8 MG/5ML Take 5 mLs by mouth every 12 (twelve) hours as needed for cough. Patient not taking: Reported on 10/04/2022 09/28/22   Donita Brooks, MD  gabapentin (NEURONTIN) 100 MG capsule Take 1 capsule (100 mg total) by mouth 3 (three) times daily as needed (nerve pain). Patient not taking: Reported on 10/04/2022 05/08/22   Donita Brooks, MD  predniSONE (DELTASONE) 20 MG tablet 3 tabs poqday 1-2, 2 tabs poqday 3-4, 1 tab poqday 5-6 Patient not taking: Reported on 10/04/2022 09/28/22   Donita Brooks, MD    Current Facility-Administered Medications  Medication Dose Route Frequency Provider Last Rate Last Admin   0.9 %  sodium chloride infusion   Intravenous Continuous Iva Boop, MD       lactated ringers infusion   Intravenous Continuous Iva Boop, MD 10 mL/hr at 10/08/22 0710 1,000 mL at 10/08/22 0710    Allergies as of 07/10/2022   (No Known Allergies)    Family History  Problem Relation Age of Onset   Hypertension Mother    Diabetes Mother    Coronary artery disease Mother    Sudden death Mother    Hypertension Father    Cancer Brother        carcinoid tumer of the appendix   Colon cancer Neg Hx    Esophageal cancer Neg Hx    Stomach cancer Neg Hx    Rectal cancer Neg Hx    Colon polyps Neg Hx     Social History   Socioeconomic History   Marital status: Widowed    Spouse name: Ladene Artist   Number of children: 2   Years of education: 16   Highest education level: Not on file  Occupational History   Occupation: QUALITY ANALYST    Employer: [employer]  Tobacco Use   Smoking status: Never   Smokeless tobacco: Never  Vaping Use   Vaping status: Never Used  Substance and Sexual Activity   Alcohol use: Yes    Alcohol/week: 0.0 standard drinks of alcohol    Comment: socially   Drug use: No   Sexual activity: Yes    Birth control/protection: Surgical  Other Topics Concern   Not on file   Social History Narrative   Widowed-2022, college education, 2 children    Nurse, 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  Physical Exam: Vital signs BP (!) 148/80   Pulse 62   Temp (!) 97.5 F (36.4 C) (Tympanic)   Resp 14   Ht 5\' 1"  (1.549 m)   Wt 122.9 kg   SpO2 96%   BMI 51.19 kg/m   General:   Alert,  Well-developed, well-nourished, pleasant and cooperative in NAD - severely obese Lungs:  Clear throughout to auscultation.   Heart:  Regular rate and rhythm; no murmurs, clicks, rubs,  or gallops. Abdomen:  Soft, nontender and nondistended. Normal bowel sounds.   Neuro/Psych:  Alert and cooperative. Normal mood and affect. A and O x 3   @Tomisha Reppucci  Sena Slate, MD, [facility] [phone] (pager) 10/08/2022 7:35 AM@

## 2022-10-08 NOTE — Transfer of Care (Signed)
Immediate Anesthesia Transfer of Care Note  Patient: Patricia [name]  Procedure(s) Performed: ESOPHAGOGASTRODUODENOSCOPY (EGD) WITH PROPOFOL  Patient Location: PACU and Endoscopy Unit  Anesthesia Type:MAC  Level of Consciousness: awake, alert , oriented, and patient cooperative  Airway & Oxygen Therapy: Patient Spontanous Breathing and Patient connected to face mask oxygen  Post-op Assessment: Report given to RN and Post -op Vital signs reviewed and stable  Post vital signs: Reviewed and stable  Last Vitals:  Vitals Value Taken Time  BP 139/71 10/08/22 0758  Temp 36.3 C 10/08/22 0757  Pulse 78 10/08/22 0803  Resp 8 10/08/22 0803  SpO2 98 % 10/08/22 0803  Vitals shown include unfiled device data.  Last Pain:  Vitals:   10/08/22 0757  TempSrc: Temporal  PainSc: 0-No pain         Complications: No notable events documented.

## 2022-10-08 NOTE — Op Note (Signed)
[facility] Patient Name: Patricia [name] Procedure Date: 10/08/2022 MRN: [id number] Attending MD: Iva Boop , MD, [id number] Date of Birth: 05/06/1965 CSN: [id number] Age: 57 Admit Type: Outpatient Procedure:                Upper GI endoscopy Indications:              Epigastric abdominal pain Providers:                Iva Boop, MD, Eliberto Ivory, RN, Kandice Robinsons, Technician Referring MD:              Medicines:                Monitored Anesthesia Care Complications:            No immediate complications. Estimated Blood Loss:     Estimated blood loss: none. Procedure:                Pre-Anesthesia Assessment:                           - Prior to the procedure, a History and Physical                            was performed, and patient medications and                            allergies were reviewed. The patient's tolerance of                            previous anesthesia was also reviewed. The risks                            and benefits of the procedure and the sedation                            options and risks were discussed with the patient.                            All questions were answered, and informed consent                            was obtained. Prior Anticoagulants: The patient has                            taken no anticoagulant or antiplatelet agents. ASA                            Grade Assessment: III - A patient with severe                            systemic disease. After reviewing the risks and  benefits, the patient was deemed in satisfactory                            condition to undergo the procedure.                           After obtaining informed consent, the endoscope was                            passed under direct vision. Throughout the                            procedure, the patient's blood pressure, pulse, and                            oxygen  saturations were monitored continuously. The                            [id number]-H190 ([id number]) Olympus endoscope was introduced                            through the mouth, and advanced to the second part                            of duodenum. The upper GI endoscopy was                            accomplished without difficulty. The patient                            tolerated the procedure well. Scope In: Scope Out: Findings:      The examined esophagus was normal.      The gastroesophageal flap valve was visualized endoscopically and       classified as Hill Grade IV (no fold, wide open lumen, hiatal hernia       present).      A 3 cm hiatal hernia was present.      Evidence of a sleeve gastrectomy was found in the stomach. This was       characterized by healthy appearing mucosa and an intact appearance.      The examined duodenum was normal.      The cardia and gastric fundus were otherwise normal on retroflexion. Impression:               - Normal esophagus.                           - Gastroesophageal flap valve classified as Hill                            Grade IV (no fold, wide open lumen, hiatal hernia                            present).                           -  3 cm hiatal hernia.                           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                           - Normal examined duodenum.                           - No specimens collected. No cause of pain found. Moderate Sedation:      Not Applicable - Patient had care per Anesthesia. Recommendation:           - Patient has a contact number available for                            emergencies. The signs and symptoms of potential                            delayed complications were discussed with the                            patient. Return to normal activities tomorrow.                            Written discharge  instructions were provided to the                            patient.                           - Resume previous diet.                           - Continue present medications.                           - Food addiction therapist could be helpful - she                            has been grieving after husbands unexpected death                            and describes herself as an Surveyor, quantity. Also                            follow-up with Dr. Sheliah Hatch. See me as needed. Procedure Code(s):        --- Professional ---                           [phone], Esophagogastroduodenoscopy, flexible,  transoral; diagnostic, including collection of                            specimen(s) by brushing or washing, when performed                            (separate procedure) Diagnosis Code(s):        --- Professional ---                           K44.9, Diaphragmatic hernia without obstruction or                            gangrene                           Z98.84, Bariatric surgery status                           R10.13, Epigastric pain CPT copyright 2022 American Medical Association. All rights reserved. The codes documented in this report are preliminary and upon coder review may  be revised to meet current compliance requirements. Iva Boop, MD 10/08/2022 8:08:09 AM This report has been signed electronically. Number of Addenda: 0

## 2022-10-08 NOTE — Anesthesia Postprocedure Evaluation (Signed)
Anesthesia Post Note  Patient: Patricia [name]  Procedure(s) Performed: ESOPHAGOGASTRODUODENOSCOPY (EGD) WITH PROPOFOL     Patient location during evaluation: Endoscopy Anesthesia Type: MAC Level of consciousness: oriented, awake and alert and awake Pain management: pain level controlled Vital Signs Assessment: post-procedure vital signs reviewed and stable Respiratory status: spontaneous breathing, nonlabored ventilation, respiratory function stable and patient connected to nasal cannula oxygen Cardiovascular status: blood pressure returned to baseline and stable Postop Assessment: no headache, no backache and no apparent nausea or vomiting Anesthetic complications: no   No notable events documented.  Last Vitals:  Vitals:   10/08/22 0810 10/08/22 0820  BP: 127/62 126/76  Pulse: 70 (!) 58  Resp: 13 19  Temp:    SpO2: 98% 99%    Last Pain:  Vitals:   10/08/22 0820  TempSrc:   PainSc: 0-No pain                 Collene Schlichter

## 2022-10-09 ENCOUNTER — Encounter (HOSPITAL_COMMUNITY): Payer: Self-pay | Admitting: Internal Medicine

## 2022-11-05 ENCOUNTER — Telehealth: Payer: Self-pay | Admitting: Internal Medicine

## 2022-11-05 NOTE — Telephone Encounter (Signed)
Patient called states she has had diarrhea for a few days now not sure is she has a parasite or food poisoning. Please advise.

## 2022-11-06 NOTE — Telephone Encounter (Signed)
Pt stated that she has had diarrhea for over a week now along with some nausea and vomiting, Lower abdominal cramping. 2-3 liquid stools daily. Pt stated that she is not eating just drinking Gatorade to stay hydrated. Pt stated that she has a history of IBS with Constipation.  Please review and advise.

## 2022-11-07 NOTE — Telephone Encounter (Signed)
Pt made aware of Dr. Gessner recommendations: Pt verbalized understanding with all questions answered.   

## 2022-11-07 NOTE — Telephone Encounter (Signed)
It is my recollection that she has had diarrhea w/ her IBS ain past also so recommend taking the imodium to control - if things fail to let up in a week she should let us know or check in with PCP

## 2022-12-12 ENCOUNTER — Other Ambulatory Visit: Payer: Self-pay | Admitting: Family Medicine

## 2022-12-12 DIAGNOSIS — I10 Essential (primary) hypertension: Secondary | ICD-10-CM

## 2022-12-13 NOTE — Telephone Encounter (Signed)
Requested Prescriptions  Pending Prescriptions Disp Refills   potassium chloride SA (KLOR-CON M20) 20 MEQ tablet [Pharmacy Med Name: KLOR-CON M20 TABLET] 90 tablet 0    Sig: TAKE 1 TABLET BY MOUTH EVERY DAY     Endocrinology:  Minerals - Potassium Supplementation Failed - 12/12/2022  1:28 AM      Failed - Valid encounter within last 12 months    Recent Outpatient Visits           1 year ago Benign essential HTN   [facility] Tanya Nones, Priscille Heidelberg, MD   2 years ago Benign essential HTN   [facility] Tanya Nones, Priscille Heidelberg, MD   2 years ago Benign essential HTN   [facility] Tanya Nones, Priscille Heidelberg, MD   3 years ago Breast pain in female   [facility] Jenne Pane, Rocky Crafts, FNP   3 years ago Adrenal adenoma, right   [facility] Pickard, Priscille Heidelberg, MD       Future Appointments             In 1 month Bjorn Pippin, MD [facility] [place]            Passed - K in normal range and within 360 days    Potassium  Date Value Ref Range Status  09/28/2022 4.1 3.5 - 5.3 mmol/L Final         Passed - Cr in normal range and within 360 days    Creat  Date Value Ref Range Status  09/28/2022 0.70 0.50 - 1.03 mg/dL Final          losartan-hydrochlorothiazide (HYZAAR) 100-12.5 MG tablet [Pharmacy Med Name: LOSARTAN-HCTZ 100-12.5 MG TAB] 90 tablet 0    Sig: TAKE 1 TABLET BY MOUTH EVERY DAY     Cardiovascular: ARB + Diuretic Combos Failed - 12/12/2022  1:28 AM      Failed - Valid encounter within last 6 months    Recent Outpatient Visits           1 year ago Benign essential HTN   [facility] Donita Brooks, MD   2 years ago Benign essential HTN   [facility] Tanya Nones, Priscille Heidelberg, MD   2 years ago Benign essential HTN   [facility] Tanya Nones, Priscille Heidelberg, MD   3 years ago Breast pain in female   [facility] Jenne Pane, Crystal A, FNP   3 years ago  Adrenal adenoma, right   [facility] Pickard, Priscille Heidelberg, MD       Future Appointments             In 1 month Bjorn Pippin, MD [facility]             Passed - K in normal range and within 180 days    Potassium  Date Value Ref Range Status  09/28/2022 4.1 3.5 - 5.3 mmol/L Final         Passed - Na in normal range and within 180 days    Sodium  Date Value Ref Range Status  09/28/2022 142 135 - 146 mmol/L Final  11/11/2017 138 134 - 144 mmol/L Final         Passed - Cr in normal range and within 180 days    Creat  Date Value Ref Range Status  09/28/2022 0.70 0.50 - 1.03 mg/dL Final  Passed - eGFR is 10 or above and within 180 days    GFR, Est African American  Date Value Ref Range Status  07/07/2020 111 > OR = 60 mL/min/1.72m2 Final   GFR, Est Non African American  Date Value Ref Range Status  07/07/2020 96 > OR = 60 mL/min/1.59m2 Final   GFR, Estimated  Date Value Ref Range Status  08/18/2021 >60 >60 mL/min Final    Comment:    (NOTE) Calculated using the CKD-EPI Creatinine Equation (2021)    GFR  Date Value Ref Range Status  06/10/2012 107.26 >60.00 mL/min Final   eGFR  Date Value Ref Range Status  09/28/2022 102 > OR = 60 mL/min/1.69m2 Final         Passed - Patient is not pregnant      Passed - Last BP in normal range    BP Readings from Last 1 Encounters:  10/08/22 126/76

## 2022-12-25 NOTE — Progress Notes (Deleted)
PATIENT: Patricia [name] DOB: 10-11-1965  REASON FOR VISIT: follow up HISTORY FROM: patient  No chief complaint on file.   HISTORY OF PRESENT ILLNESS:  12/25/22 ALL: Patricia [name] returns for follow up for OSA on CPAP.   12/27/2022 ALL: Patricia [name] returns for annual follow up for OSA on CPAP. She continues to do well on therapy. She is using CPAP nightly for about 6.5h. She denies concerns with machine or supplies. She does wake after about 3-4 hours of sleep. She is usually able to return to sleep. She feels that her mind starts working and makes it difficult to return to sleep. PCP started her on trazodone but she is not taking consistently.     12/27/2020 ALL: Patricia [name] returns for follow up for OSA on CPAP. She continues to do well with therapy. She is using CPAP most every night for at least 4 hours. She denies concerns with supplies or machine. She is sleeping fairly well. She lost her husband in 05/2020 to pancreatic cancer. He passed 1 month following diagnosis. She is seeing a Veterinary surgeon and feels she is doing well. She has a great support system. She torn her ACL and recently released to return to activity. She is planning to start PT.     12/28/2019 ALL:  Patricia [name] is a 57 y.o. female here today for follow up for OSA on CPAP. She is doing well. She is using CPAP nightly. She denies concerns with machine or supplies. She is working with PCP for concerns of sciatica. She does note waking around 3am at times. She feels that her mind will start racing.  She has tried melatonin in the past that was helpful. Set up date 08/2018.   Compliance report dated 11/24/2019 through 12/23/2019 reveals that she used CPAP 30 of the past 30 days for compliance of 100%. She is CPAP greater than 4 hours all 30 days. Average usage was 7 hours and 13 minutes. Residual AHI was 0.7 on 8 cm of water and an EPR of 3. There was no significant leak noted.  HISTORY: (copied from my note on 12/25/2018)  Patricia [name]  is a 57 y.o. female here today for follow up of OSA on CPAP. She is doing very well with her new CPAP machine.  Compliance report dated 11/24/2018 through 12/23/2018 reveals that she is using CPAP 27 out of the last 30 days for compliance of 90%.  27 days she used CPAP greater than 4 hours for compliance 90%.  Average usage was 7 hours and 15 minutes.  AHI was 0.6 on 8 cm of water and an EPR of 3.  There was no significant leak noted.  She does report going out of town for a week and that contributes to days lost.   HISTORY: (copied from Dr Teofilo Pod note on 09/15/2018)   Ms. Harten is a very pleasant 57 year old right-handed woman with an underlying medical history of irritable bowel syndrome, morbid obesity, hypertension, reflux disease as well as kidney stones, who presents for follow-up consultation of her obstructive sleep apnea, well-established on treatment with CPAP. She is unaccompanied today. I last saw her on 08/15/2017, at which time she was stable on treatment.    Today, 09/15/2018: I reviewed her CPAP compliance data from 08/16/2018 through 09/14/2018, which is a total of 30 days, during which time she used her machine every night with percent used days greater than 4 hours at 93%, indicating excellent compliance with an average usage of 6  hours and 28 minutes, residual AHI at goal at 1.2/h, leak acceptable with a 95th percentile at 7.9 L/min on a pressure of 8 cm with EPR of 3.  She reports that her supplies are expensive.  She buys most of them online.  She should be eligible for new machine and I explained to her and we should try to get this through her DME company, [employer].  She would be willing to give it a try.   She reports doing well. Her weight has been stable. She does exercise on a regular basis.  She has had some flareup of her right-sided sciatica.   The patient's allergies, current medications, family history, past medical history, past social history, past surgical history and  problem list were reviewed and updated as appropriate.    REVIEW OF SYSTEMS: Out of a complete 14 system review of symptoms, the patient complains only of the following symptoms, fatigue, less motivation, left foot pain and all other reviewed systems are negative.      12/26/2021    7:58 AM  Results of the Epworth flowsheet  Sitting and reading 3  Watching TV 1  Sitting, inactive in a public place (e.g. a theatre or a meeting) 1  As a passenger in a car for an hour without a break 0  Lying down to rest in the afternoon when circumstances permit 3  Sitting and talking to someone 0  Sitting quietly after a lunch without alcohol 0  In a car, while stopped for a few minutes in traffic 0  Total score 8      ALLERGIES: No Known Allergies  HOME MEDICATIONS: Outpatient Medications Prior to Visit  Medication Sig Dispense Refill   ALPRAZolam (XANAX) 0.5 MG tablet Take 1 tablet (0.5 mg total) by mouth at bedtime as needed for anxiety or sleep. 30 tablet 0   atorvastatin (LIPITOR) 10 MG tablet TAKE 1 TABLET BY MOUTH DAILY. (Patient taking differently: Take 10 mg by mouth every evening. TAKE 1 TABLET BY MOUTH DAILY.) 90 tablet 2   fluticasone (FLONASE) 50 MCG/ACT nasal spray Place 2 sprays into both nostrils daily. 16 g 6   gabapentin (NEURONTIN) 100 MG capsule Take 1 capsule (100 mg total) by mouth 3 (three) times daily as needed (nerve pain). (Patient not taking: Reported on 10/04/2022) 90 capsule 3   hyoscyamine (LEVBID) 0.375 MG 12 hr tablet Take 1 tablet (0.375 mg total) by mouth 2 (two) times daily. (Patient taking differently: Take 0.375 mg by mouth at bedtime.) 60 tablet 11   losartan-hydrochlorothiazide (HYZAAR) 100-12.5 MG tablet TAKE 1 TABLET BY MOUTH EVERY DAY 90 tablet 0   Multiple Vitamin (MULTIVITAMIN) capsule Take 1 capsule by mouth daily.     omeprazole (PRILOSEC) 40 MG capsule TAKE 1 CAPSULE BY MOUTH EVERY DAY (Patient taking differently: Take 40 mg by mouth every evening.) 30  capsule 11   OVER THE COUNTER MEDICATION Take 1 capsule by mouth daily. Sea Moss Supplement     potassium chloride SA (KLOR-CON M20) 20 MEQ tablet TAKE 1 TABLET BY MOUTH EVERY DAY 90 tablet 0   Probiotic Product (PROBIOTIC DAILY PO) Take 1 tablet by mouth daily.     No facility-administered medications prior to visit.    PAST MEDICAL HISTORY: Past Medical History:  Diagnosis Date   Arthritis    "spine-DDD-intermittent sciatic pain"   Chronic cervicitis    resolved   Diabetes mellitus without complication (HCC)    diet controlled   GERD (gastroesophageal reflux  disease)    Heart murmur    "benign"-routinely sees yearly - Dr. Wayne Sever, cardiology   History of kidney stones    multiple kidney stones-passed.   Hypertension    IBS (irritable bowel syndrome)    controlled with diet   Menorrhagia    resolved s/p Hysterectomy   Obesity    Sleep apnea    cpap used nightly   Vitamin D deficiency     PAST SURGICAL HISTORY: Past Surgical History:  Procedure Laterality Date   BREAST LUMPECTOMY Left    benign hamartoma   CESAREAN SECTION     x1   COLONOSCOPY  10/19/2005   ENDOMETRIAL ABLATION     ESOPHAGOGASTRODUODENOSCOPY  12/21/2008   ESOPHAGOGASTRODUODENOSCOPY (EGD) WITH PROPOFOL N/A 10/08/2022   Procedure: ESOPHAGOGASTRODUODENOSCOPY (EGD) WITH PROPOFOL;  Surgeon: Iva Boop, MD;  Location: [facility];  Service: Gastroenterology;  Laterality: N/A;   HYSTEROSCOPY WITH D & C     resection of endometrial polyp   LAPAROSCOPIC GASTRIC SLEEVE RESECTION N/A 03/12/2016   Procedure: LAPAROSCOPIC GASTRIC SLEEVE RESECTION WITH UPPER ENDO;  Surgeon: De Blanch Kinsinger, MD;  Location: [facility];  Service: General;  Laterality: N/A;   TUBAL LIGATION     Uterine Polyps Removed     VAGINAL HYSTERECTOMY     fibroids    FAMILY HISTORY: Family History  Problem Relation Age of Onset   Hypertension Mother    Diabetes Mother    Coronary artery disease Mother    Sudden death  Mother    Hypertension Father    Cancer Brother        carcinoid tumer of the appendix   Colon cancer Neg Hx    Esophageal cancer Neg Hx    Stomach cancer Neg Hx    Rectal cancer Neg Hx    Colon polyps Neg Hx     SOCIAL HISTORY: Social History   Socioeconomic History   Marital status: Widowed    Spouse name: Ladene Artist   Number of children: 2   Years of education: 16   Highest education level: Not on file  Occupational History   Occupation: QUALITY ANALYST    Employer: [employer]  Tobacco Use   Smoking status: Never   Smokeless tobacco: Never  Vaping Use   Vaping status: Never Used  Substance and Sexual Activity   Alcohol use: Yes    Alcohol/week: 0.0 standard drinks of alcohol    Comment: socially   Drug use: No   Sexual activity: Yes    Birth control/protection: Surgical  Other Topics Concern   Not on file  Social History Narrative   Widowed-2022, college education, 2 children    Nurse, 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    There is no height or weight on file to calculate BMI.  Generalized: Well developed, in no acute distress  Cardiology: normal rate and rhythm, no murmur noted Respiratory: clear to auscultation bilaterally  Neurological examination  Mentation: Alert oriented to time,  place, history taking. Follows all commands speech and language fluent Cranial nerve II-XII: Pupils were equal round reactive to light. Extraocular movements were full, visual field were full  Motor: The motor testing  reveals 5 over 5 strength of all 4 extremities. Good symmetric motor tone is noted throughout.  Gait and station: Gait is normal.    DIAGNOSTIC DATA (LABS, IMAGING, TESTING) - I reviewed patient records, labs, notes, testing and imaging myself where available.      No data to display           Lab Results  Component Value Date   WBC 4.5 09/28/2022   HGB 13.1 09/28/2022   HCT 38.9 09/28/2022   MCV 85.9 09/28/2022   PLT 260 09/28/2022      Component Value Date/Time   NA 142 09/28/2022 1031   NA 138 11/11/2017 1137   K 4.1 09/28/2022 1031   CL 105 09/28/2022 1031   CO2 27 09/28/2022 1031   GLUCOSE 92 09/28/2022 1031   BUN 17 09/28/2022 1031   BUN 11 11/11/2017 1137   CREATININE 0.70 09/28/2022 1031   CALCIUM 9.6 09/28/2022 1031   PROT 6.7 09/28/2022 1031   PROT 6.6 11/11/2017 1137   ALBUMIN 3.8 08/18/2021 0927   ALBUMIN 4.1 11/11/2017 1137   AST 17 09/28/2022 1031   ALT 16 09/28/2022 1031   ALKPHOS 86 08/18/2021 0927   BILITOT 0.5 09/28/2022 1031   BILITOT 0.3 11/11/2017 1137   GFRNONAA >60 08/18/2021 0927   GFRNONAA 96 07/07/2020 0831   GFRAA 111 07/07/2020 0831   Lab Results  Component Value Date   CHOL 166 09/28/2022   HDL 68 09/28/2022   LDLCALC 81 09/28/2022   TRIG 91 09/28/2022   CHOLHDL 2.4 09/28/2022   Lab Results  Component Value Date   HGBA1C 6.2 (H) 05/08/2022   Lab Results  Component Value Date   VITAMINB12 >2000 (H) 11/11/2017   Lab Results  Component Value Date   TSH 2.050 08/18/2021       ASSESSMENT AND PLAN 57 y.o. year old female  has a past medical history of Arthritis, Chronic cervicitis, Diabetes mellitus without complication (HCC), GERD (gastroesophageal reflux disease), Heart murmur, History of kidney stones, Hypertension, IBS (irritable bowel syndrome), Menorrhagia, Obesity, Sleep apnea, and Vitamin D deficiency. here with   No diagnosis found.     Yarisa is doing very well on CPAP. Compliance report reveals excellent  compliance. She was encouraged to continue using CPAP nightly and for greater than 4 hours each night. We will update supply orders. She may consider melatonin ER OTC or resuming trazodone per PCP direction. Healthy lifestyle habits encouraged. She will follow-up with me in 1 year, sooner if needed. She verbalizes understanding and agreement with this plan.   No orders of the defined types were placed in this encounter.     No orders of the defined types were placed in this encounter.       Shawnie Dapper, FNP-C 12/25/2022, 9:17 AM [facility] [address] [place], [place] [address] [phone]

## 2022-12-27 ENCOUNTER — Ambulatory Visit: Payer: 59 | Admitting: Family Medicine

## 2022-12-27 ENCOUNTER — Encounter: Payer: Self-pay | Admitting: Family Medicine

## 2022-12-27 DIAGNOSIS — G4733 Obstructive sleep apnea (adult) (pediatric): Secondary | ICD-10-CM

## 2023-01-15 DIAGNOSIS — Z0289 Encounter for other administrative examinations: Secondary | ICD-10-CM

## 2023-01-31 ENCOUNTER — Ambulatory Visit: Payer: 59 | Admitting: Urology

## 2023-02-05 ENCOUNTER — Other Ambulatory Visit (INDEPENDENT_AMBULATORY_CARE_PROVIDER_SITE_OTHER): Payer: 59

## 2023-02-05 ENCOUNTER — Ambulatory Visit: Payer: 59 | Admitting: Orthopedic Surgery

## 2023-02-05 DIAGNOSIS — M25552 Pain in left hip: Secondary | ICD-10-CM | POA: Diagnosis not present

## 2023-02-05 MED ORDER — PREDNISONE 10 MG PO TABS: 10.0000 mg | ORAL_TABLET | Freq: Every day | ORAL | 0 refills | Status: DC

## 2023-02-08 ENCOUNTER — Encounter: Payer: Self-pay | Admitting: Orthopedic Surgery

## 2023-02-08 NOTE — Progress Notes (Signed)
Office Visit Note   Patient: Patricia [name]           Date of Birth: April 20, 1965           MRN: [id number] Visit Date: 02/05/2023              Requested by: Donita Brooks, MD [address] [place],  [place] [address] PCP: Donita Brooks, MD  Chief Complaint  Patient presents with   Left Hip - Pain   Right Ankle - Pain    Achilles tendon      HPI: Patient is a 57 year old woman who presents with left hip pain for 2 months.  She denies any injuries.  Patient is also reports a history of partial rupture of the right Achilles tendon.  She has been in a fracture boot for 3 months.  Patient states the pain is in the left groin pain with long driving.  Assessment & Plan: Visit Diagnoses:  1. Pain in left hip     Plan: Prescription provided for prednisone reevaluate in 4 weeks.  2 view radiographs of the lumbar spine at follow-up.  Recommended Voltaren gel for the Achilles tendinitis if not better will have her follow-up with Dr. Shon Baton for shockwave therapy.  Follow-Up Instructions: Return in about 4 weeks (around 03/05/2023).   Ortho Exam  Patient is alert, oriented, no adenopathy, well-dressed, normal affect, normal respiratory effort. Examination of the right Achilles there is no swelling no nodular changes the tendon is intact there is dorsiflexion to neutral with good range of motion.  No palpable defect.  Examination of both hips she has equal range of motion without pain with range of motion.  There is a negative straight leg raise.  No focal motor weakness.  Imaging: No results found. No images are attached to the encounter.  Labs: Lab Results  Component Value Date   HGBA1C 6.2 (H) 05/08/2022   HGBA1C 5.8 (H) 10/02/2021   HGBA1C 5.7 (H) 07/07/2020   ESRSEDRATE 38 (H) 01/05/2020   ESRSEDRATE 61 Repeated and verified X2. (H) 08/31/2019   CRP 33.6 (H) 01/05/2020   CRP 3.7 08/31/2019   LABURIC 5.6 11/12/2011   LABORGA No  Salmonella,Shigella,Campylobacter,Yersinia,or 06/10/2012   LABORGA No E.coli 0157:H7 isolated. 06/10/2012   LABORGA NO GROWTH 06/10/2012     Lab Results  Component Value Date   ALBUMIN 3.8 08/18/2021   ALBUMIN 4.1 11/11/2017   ALBUMIN 4.0 03/13/2016    No results found for: "MG" Lab Results  Component Value Date   VD25OH 40.0 11/11/2017    No results found for: "PREALBUMIN"    Latest Ref Rng & Units 09/28/2022   10:31 AM 05/08/2022   12:42 PM 10/02/2021    8:46 AM  CBC EXTENDED  WBC 3.8 - 10.8 Thousand/uL 4.5  5.5  4.1   RBC 3.80 - 5.10 Million/uL 4.53  4.56  4.69   Hemoglobin 11.7 - 15.5 g/dL 30.8  65.7  84.6   HCT 35.0 - 45.0 % 38.9  39.4  41.6   Platelets 140 - 400 Thousand/uL 260  252  246   NEUT# 1,500 - 7,800 cells/uL 2,489  3,179  2,038   Lymph# 850 - 3,900 cells/uL 1,593  1,865  1,550      There is no height or weight on file to calculate BMI.  Orders:  Orders Placed This Encounter  Procedures   XR HIP UNILAT W OR W/O PELVIS 2-3 VIEWS LEFT   Meds ordered this  encounter  Medications   predniSONE (DELTASONE) 10 MG tablet    Sig: Take 1 tablet (10 mg total) by mouth daily with breakfast.    Dispense:  30 tablet    Refill:  0     Procedures: No procedures performed  Clinical Data: No additional findings.  ROS:  All other systems negative, except as noted in the HPI. Review of Systems  Objective: Vital Signs: There were no vitals taken for this visit.  Specialty Comments:  No specialty comments available.  PMFS History: Patient Active Problem List   Diagnosis Date Noted   Obesity 03/12/2016   Palpitations 07/07/2013   Acute sinusitis 04/21/2013   Obstructive sleep apnea (adult) (pediatric) 08/25/2012   Hypersomnia with sleep apnea 08/25/2012   Bilateral lower extremity edema 11/27/2011   Obesity, Class III, BMI 40-49.9 (morbid obesity) (HCC) 10/26/2011   Essential hypertension 11/28/2008   IRRITABLE BOWEL SYNDROME - diarrhea predominant  11/10/2008   Past Medical History:  Diagnosis Date   Arthritis    "spine-DDD-intermittent sciatic pain"   Chronic cervicitis    resolved   Diabetes mellitus without complication (HCC)    diet controlled   GERD (gastroesophageal reflux disease)    Heart murmur    "benign"-routinely sees yearly - Dr. Wayne Sever, cardiology   History of kidney stones    multiple kidney stones-passed.   Hypertension    IBS (irritable bowel syndrome)    controlled with diet   Menorrhagia    resolved s/p Hysterectomy   Obesity    Sleep apnea    cpap used nightly   Vitamin D deficiency     Family History  Problem Relation Age of Onset   Hypertension Mother    Diabetes Mother    Coronary artery disease Mother    Sudden death Mother    Hypertension Father    Cancer Brother        carcinoid tumer of the appendix   Colon cancer Neg Hx    Esophageal cancer Neg Hx    Stomach cancer Neg Hx    Rectal cancer Neg Hx    Colon polyps Neg Hx     Past Surgical History:  Procedure Laterality Date   BREAST LUMPECTOMY Left    benign hamartoma   CESAREAN SECTION     x1   COLONOSCOPY  10/19/2005   ENDOMETRIAL ABLATION     ESOPHAGOGASTRODUODENOSCOPY  12/21/2008   ESOPHAGOGASTRODUODENOSCOPY (EGD) WITH PROPOFOL N/A 10/08/2022   Procedure: ESOPHAGOGASTRODUODENOSCOPY (EGD) WITH PROPOFOL;  Surgeon: Iva Boop, MD;  Location: [facility];  Service: Gastroenterology;  Laterality: N/A;   HYSTEROSCOPY WITH D & C     resection of endometrial polyp   LAPAROSCOPIC GASTRIC SLEEVE RESECTION N/A 03/12/2016   Procedure: LAPAROSCOPIC GASTRIC SLEEVE RESECTION WITH UPPER ENDO;  Surgeon: De Blanch Kinsinger, MD;  Location: [facility];  Service: General;  Laterality: N/A;   TUBAL LIGATION     Uterine Polyps Removed     VAGINAL HYSTERECTOMY     fibroids   Social History   Occupational History   Occupation: QUALITY ANALYST    Employer: [employer]  Tobacco Use   Smoking status: Never   Smokeless tobacco:  Never  Vaping Use   Vaping status: Never Used  Substance and Sexual Activity   Alcohol use: Yes    Alcohol/week: 0.0 standard drinks of alcohol    Comment: socially   Drug use: No   Sexual activity: Yes    Birth control/protection: Surgical

## 2023-02-25 NOTE — Progress Notes (Unsigned)
PATIENT: Patricia [name] DOB: Jun 05, 1965  REASON FOR VISIT: follow up HISTORY FROM: patient  No chief complaint on file.   HISTORY OF PRESENT ILLNESS:  02/25/23 ALL: Patricia [name] returns for follow up for OSA on CPAP.     12/26/2021 ALL:  Patricia [name] returns for annual follow up for OSA on CPAP. She continues to do well on therapy. She is using CPAP nightly for about 6.5h. She denies concerns with machine or supplies. She does wake after about 3-4 hours of sleep. She is usually able to return to sleep. She feels that her mind starts working and makes it difficult to return to sleep. PCP started her on trazodone but she is not taking consistently.     12/27/2020 ALL: Patricia [name] returns for follow up for OSA on CPAP. She continues to do well with therapy. She is using CPAP most every night for at least 4 hours. She denies concerns with supplies or machine. She is sleeping fairly well. She lost her husband in 05/2020 to pancreatic cancer. He passed 1 month following diagnosis. She is seeing a Veterinary surgeon and feels she is doing well. She has a great support system. She torn her ACL and recently released to return to activity. She is planning to start PT.     12/28/2019 ALL:  Patricia [name] is a 57 y.o. female here today for follow up for OSA on CPAP. She is doing well. She is using CPAP nightly. She denies concerns with machine or supplies. She is working with PCP for concerns of sciatica. She does note waking around 3am at times. She feels that her mind will start racing.  She has tried melatonin in the past that was helpful. Set up date 08/2018.   Compliance report dated 11/24/2019 through 12/23/2019 reveals that she used CPAP 30 of the past 30 days for compliance of 100%. She is CPAP greater than 4 hours all 30 days. Average usage was 7 hours and 13 minutes. Residual AHI was 0.7 on 8 cm of water and an EPR of 3. There was no significant leak noted.  HISTORY: (copied from my note on 12/25/2018)  Patricia [name] is a 57 y.o. female here today for follow up of OSA on CPAP. She is doing very well with her new CPAP machine.  Compliance report dated 11/24/2018 through 12/23/2018 reveals that she is using CPAP 27 out of the last 30 days for compliance of 90%.  27 days she used CPAP greater than 4 hours for compliance 90%.  Average usage was 7 hours and 15 minutes.  AHI was 0.6 on 8 cm of water and an EPR of 3.  There was no significant leak noted.  She does report going out of town for a week and that contributes to days lost.   HISTORY: (copied from Dr Teofilo Pod note on 09/15/2018)   Patricia [name] is a very pleasant 57 year old right-handed woman with an underlying medical history of irritable bowel syndrome, morbid obesity, hypertension, reflux disease as well as kidney stones, who presents for follow-up consultation of her obstructive sleep apnea, well-established on treatment with CPAP. She is unaccompanied today. I last saw her on 08/15/2017, at which time she was stable on treatment.    Today, 09/15/2018: I reviewed her CPAP compliance data from 08/16/2018 through 09/14/2018, which is a total of 30 days, during which time she used her machine every night with percent used days greater than 4 hours at 93%, indicating excellent compliance with an average  usage of 6 hours and 28 minutes, residual AHI at goal at 1.2/h, leak acceptable with a 95th percentile at 7.9 L/min on a pressure of 8 cm with EPR of 3.  She reports that her supplies are expensive.  She buys most of them online.  She should be eligible for new machine and I explained to her and we should try to get this through her DME company, [employer].  She would be willing to give it a try.   She reports doing well. Her weight has been stable. She does exercise on a regular basis.  She has had some flareup of her right-sided sciatica.   The patient's allergies, current medications, family history, past medical history, past social history, past surgical history  and problem list were reviewed and updated as appropriate.    REVIEW OF SYSTEMS: Out of a complete 14 system review of symptoms, the patient complains only of the following symptoms, fatigue, less motivation, left foot pain and all other reviewed systems are negative.      12/26/2021    7:58 AM  Results of the Epworth flowsheet  Sitting and reading 3  Watching TV 1  Sitting, inactive in a public place (e.g. a theatre or a meeting) 1  As a passenger in a car for an hour without a break 0  Lying down to rest in the afternoon when circumstances permit 3  Sitting and talking to someone 0  Sitting quietly after a lunch without alcohol 0  In a car, while stopped for a few minutes in traffic 0  Total score 8      ALLERGIES: No Known Allergies  HOME MEDICATIONS: Outpatient Medications Prior to Visit  Medication Sig Dispense Refill   ALPRAZolam (XANAX) 0.5 MG tablet Take 1 tablet (0.5 mg total) by mouth at bedtime as needed for anxiety or sleep. 30 tablet 0   atorvastatin (LIPITOR) 10 MG tablet TAKE 1 TABLET BY MOUTH DAILY. (Patient taking differently: Take 10 mg by mouth every evening. TAKE 1 TABLET BY MOUTH DAILY.) 90 tablet 2   fluticasone (FLONASE) 50 MCG/ACT nasal spray Place 2 sprays into both nostrils daily. 16 g 6   gabapentin (NEURONTIN) 100 MG capsule Take 1 capsule (100 mg total) by mouth 3 (three) times daily as needed (nerve pain). (Patient not taking: Reported on 10/04/2022) 90 capsule 3   hyoscyamine (LEVBID) 0.375 MG 12 hr tablet Take 1 tablet (0.375 mg total) by mouth 2 (two) times daily. (Patient taking differently: Take 0.375 mg by mouth at bedtime.) 60 tablet 11   losartan-hydrochlorothiazide (HYZAAR) 100-12.5 MG tablet TAKE 1 TABLET BY MOUTH EVERY DAY 90 tablet 0   Multiple Vitamin (MULTIVITAMIN) capsule Take 1 capsule by mouth daily.     omeprazole (PRILOSEC) 40 MG capsule TAKE 1 CAPSULE BY MOUTH EVERY DAY (Patient taking differently: Take 40 mg by mouth every evening.)  30 capsule 11   OVER THE COUNTER MEDICATION Take 1 capsule by mouth daily. Sea Moss Supplement     potassium chloride SA (KLOR-CON M20) 20 MEQ tablet TAKE 1 TABLET BY MOUTH EVERY DAY 90 tablet 0   predniSONE (DELTASONE) 10 MG tablet Take 1 tablet (10 mg total) by mouth daily with breakfast. 30 tablet 0   Probiotic Product (PROBIOTIC DAILY PO) Take 1 tablet by mouth daily.     No facility-administered medications prior to visit.    PAST MEDICAL HISTORY: Past Medical History:  Diagnosis Date   Arthritis    "spine-DDD-intermittent sciatic pain"  Chronic cervicitis    resolved   Diabetes mellitus without complication (HCC)    diet controlled   GERD (gastroesophageal reflux disease)    Heart murmur    "benign"-routinely sees yearly - Dr. Wayne Sever, cardiology   History of kidney stones    multiple kidney stones-passed.   Hypertension    IBS (irritable bowel syndrome)    controlled with diet   Menorrhagia    resolved s/p Hysterectomy   Obesity    Sleep apnea    cpap used nightly   Vitamin D deficiency     PAST SURGICAL HISTORY: Past Surgical History:  Procedure Laterality Date   BREAST LUMPECTOMY Left    benign hamartoma   CESAREAN SECTION     x1   COLONOSCOPY  10/19/2005   ENDOMETRIAL ABLATION     ESOPHAGOGASTRODUODENOSCOPY  12/21/2008   ESOPHAGOGASTRODUODENOSCOPY (EGD) WITH PROPOFOL N/A 10/08/2022   Procedure: ESOPHAGOGASTRODUODENOSCOPY (EGD) WITH PROPOFOL;  Surgeon: Iva Boop, MD;  Location: [facility];  Service: Gastroenterology;  Laterality: N/A;   HYSTEROSCOPY WITH D & C     resection of endometrial polyp   LAPAROSCOPIC GASTRIC SLEEVE RESECTION N/A 03/12/2016   Procedure: LAPAROSCOPIC GASTRIC SLEEVE RESECTION WITH UPPER ENDO;  Surgeon: De Blanch Kinsinger, MD;  Location: [facility];  Service: General;  Laterality: N/A;   TUBAL LIGATION     Uterine Polyps Removed     VAGINAL HYSTERECTOMY     fibroids    FAMILY HISTORY: Family History  Problem  Relation Age of Onset   Hypertension Mother    Diabetes Mother    Coronary artery disease Mother    Sudden death Mother    Hypertension Father    Cancer Brother        carcinoid tumer of the appendix   Colon cancer Neg Hx    Esophageal cancer Neg Hx    Stomach cancer Neg Hx    Rectal cancer Neg Hx    Colon polyps Neg Hx     SOCIAL HISTORY: Social History   Socioeconomic History   Marital status: Widowed    Spouse name: Ladene Artist   Number of children: 2   Years of education: 16   Highest education level: Not on file  Occupational History   Occupation: QUALITY ANALYST    Employer: [employer]  Tobacco Use   Smoking status: Never   Smokeless tobacco: Never  Vaping Use   Vaping status: Never Used  Substance and Sexual Activity   Alcohol use: Yes    Alcohol/week: 0.0 standard drinks of alcohol    Comment: socially   Drug use: No   Sexual activity: Yes    Birth control/protection: Surgical  Other Topics Concern   Not on file  Social History Narrative   Widowed-2022, college education, 2 children    Nurse, 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    There is no height or weight on file to calculate BMI.  Generalized: Well developed, in no acute distress  Cardiology: normal rate and rhythm, no murmur noted Respiratory: clear to auscultation bilaterally  Neurological examination  Mentation: Alert oriented to time, place, history taking. Follows all commands speech and language fluent Cranial nerve II-XII: Pupils  were equal round reactive to light. Extraocular movements were full, visual field were full  Motor: The motor testing reveals 5 over 5 strength of all 4 extremities. Good symmetric motor tone is noted throughout.  Gait and station: Gait is normal.    DIAGNOSTIC DATA (LABS, IMAGING, TESTING) - I reviewed patient records, labs, notes, testing and imaging myself where available.      No data to display           Lab Results  Component Value Date   WBC 4.5 09/28/2022   HGB 13.1 09/28/2022   HCT 38.9 09/28/2022   MCV 85.9 09/28/2022   PLT 260 09/28/2022      Component Value Date/Time   NA 142 09/28/2022 1031   NA 138 11/11/2017 1137   K 4.1 09/28/2022 1031   CL 105 09/28/2022 1031   CO2 27 09/28/2022 1031   GLUCOSE 92 09/28/2022 1031   BUN 17 09/28/2022 1031   BUN 11 11/11/2017 1137   CREATININE 0.70 09/28/2022 1031   CALCIUM 9.6 09/28/2022 1031   PROT 6.7 09/28/2022 1031   PROT 6.6 11/11/2017 1137   ALBUMIN 3.8 08/18/2021 0927   ALBUMIN 4.1 11/11/2017 1137   AST 17 09/28/2022 1031   ALT 16 09/28/2022 1031   ALKPHOS 86 08/18/2021 0927   BILITOT 0.5 09/28/2022 1031   BILITOT 0.3 11/11/2017 1137   GFRNONAA >60 08/18/2021 0927   GFRNONAA 96 07/07/2020 0831   GFRAA 111 07/07/2020 0831   Lab Results  Component Value Date   CHOL 166 09/28/2022   HDL 68 09/28/2022   LDLCALC 81 09/28/2022   TRIG 91 09/28/2022   CHOLHDL 2.4 09/28/2022   Lab Results  Component Value Date   HGBA1C 6.2 (H) 05/08/2022   Lab Results  Component Value Date   VITAMINB12 >2000 (H) 11/11/2017   Lab Results  Component Value Date   TSH 2.050 08/18/2021       ASSESSMENT AND PLAN 57 y.o. year old female  has a past medical history of Arthritis, Chronic cervicitis, Diabetes mellitus without complication (HCC), GERD (gastroesophageal reflux disease), Heart murmur, History of kidney stones, Hypertension, IBS (irritable bowel syndrome), Menorrhagia, Obesity, Sleep apnea, and Vitamin D  deficiency. here with   No diagnosis found.     Patricia [name] is doing very well on CPAP. Compliance report reveals excellent compliance. She was encouraged to continue using CPAP nightly and for greater than 4 hours each night. We will update supply orders. She may consider melatonin ER OTC or resuming trazodone per PCP direction. Healthy lifestyle habits encouraged. She will follow-up with me in 1 year, sooner if needed. She verbalizes understanding and agreement with this plan.   No orders of the defined types were placed in this encounter.     No orders of the defined types were placed in this encounter.       Shawnie Dapper, FNP-C 02/25/2023, 2:54 PM [facility] [address], [address] [place], [place] [address] [phone]

## 2023-02-25 NOTE — Patient Instructions (Signed)

## 2023-02-26 ENCOUNTER — Ambulatory Visit: Payer: 59 | Admitting: Family Medicine

## 2023-02-26 ENCOUNTER — Encounter: Payer: Self-pay | Admitting: Family Medicine

## 2023-02-26 VITALS — BP 137/87 | HR 75 | Ht 61.0 in | Wt 270.5 lb

## 2023-02-26 DIAGNOSIS — G4733 Obstructive sleep apnea (adult) (pediatric): Secondary | ICD-10-CM

## 2023-03-05 ENCOUNTER — Ambulatory Visit: Payer: 59 | Admitting: Orthopedic Surgery

## 2023-03-13 ENCOUNTER — Other Ambulatory Visit: Payer: Self-pay | Admitting: Family Medicine

## 2023-03-13 DIAGNOSIS — I10 Essential (primary) hypertension: Secondary | ICD-10-CM

## 2023-05-12 ENCOUNTER — Other Ambulatory Visit: Payer: Self-pay | Admitting: Cardiology

## 2023-05-13 ENCOUNTER — Other Ambulatory Visit: Payer: Self-pay

## 2023-05-13 NOTE — Telephone Encounter (Signed)
Prescription Request  05/13/2023  LOV: 09/28/22  What is the name of the medication or equipment? omeprazole (PRILOSEC) 40 MG capsule [[id number]]   Have you contacted your pharmacy to request a refill? Yes   Which pharmacy would you like this sent to?  [id number] [place], [place] - [address] [place] [address] Phone: [phone] Fax: [phone]    Patient notified that their request is being sent to the clinical staff for review and that they should receive a response within 2 business days.   Please advise at [facility] [phone]

## 2023-05-14 MED ORDER — OMEPRAZOLE 40 MG PO CPDR: 40.0000 mg | DELAYED_RELEASE_CAPSULE | Freq: Every day | ORAL | 0 refills | Status: DC

## 2023-05-14 NOTE — Telephone Encounter (Signed)
OV needed for additional refills, 30 day supply given until OV can be made.  Requested Prescriptions  Pending Prescriptions Disp Refills   omeprazole (PRILOSEC) 40 MG capsule 30 capsule 0    Sig: Take 1 capsule (40 mg total) by mouth daily.     Gastroenterology: Proton Pump Inhibitors Failed - 05/14/2023 11:53 AM      Failed - Valid encounter within last 12 months    Recent Outpatient Visits           2 years ago Benign essential HTN   [facility] Tanya Nones Priscille Heidelberg, MD   2 years ago Benign essential HTN   [facility] Tanya Nones Priscille Heidelberg, MD   3 years ago Benign essential HTN   [facility] Donita Brooks, MD   3 years ago Breast pain in female   [facility] Elmore Guise, FNP   3 years ago Adrenal adenoma, right   [facility] Pickard, Priscille Heidelberg, MD

## 2023-06-11 ENCOUNTER — Other Ambulatory Visit: Payer: Self-pay | Admitting: Family Medicine

## 2023-06-12 NOTE — Telephone Encounter (Signed)
 Unable to refill per protocol, appointment needed.   Requested Prescriptions  Pending Prescriptions Disp Refills   omeprazole (PRILOSEC) 40 MG capsule [Pharmacy Med Name: OMEPRAZOLE DR 40 MG CAPSULE] 90 capsule 1    Sig: TAKE 1 CAPSULE (40 MG TOTAL) BY MOUTH DAILY.     Gastroenterology: Proton Pump Inhibitors Failed - 06/12/2023  3:52 PM      Failed - Valid encounter within last 12 months    Recent Outpatient Visits           2 years ago Benign essential HTN   [facility] Tanya Nones Priscille Heidelberg, MD   2 years ago Benign essential HTN   [facility] Tanya Nones Priscille Heidelberg, MD   3 years ago Benign essential HTN   [facility] Donita Brooks, MD   3 years ago Breast pain in female   [facility] Elmore Guise, FNP   3 years ago Adrenal adenoma, right   [facility] Pickard, Priscille Heidelberg, MD

## 2023-06-13 ENCOUNTER — Other Ambulatory Visit: Payer: Self-pay | Admitting: Internal Medicine

## 2023-06-15 ENCOUNTER — Other Ambulatory Visit: Payer: Self-pay | Admitting: Family Medicine

## 2023-06-17 ENCOUNTER — Other Ambulatory Visit: Payer: Self-pay

## 2023-06-17 ENCOUNTER — Telehealth: Payer: Self-pay | Admitting: Family Medicine

## 2023-06-17 DIAGNOSIS — I10 Essential (primary) hypertension: Secondary | ICD-10-CM

## 2023-06-17 MED ORDER — LOSARTAN POTASSIUM-HCTZ 100-12.5 MG PO TABS: 1.0000 | ORAL_TABLET | Freq: Every day | ORAL | 2 refills | Status: DC

## 2023-06-17 MED ORDER — POTASSIUM CHLORIDE CRYS ER 20 MEQ PO TBCR
20.0000 meq | EXTENDED_RELEASE_TABLET | Freq: Every day | ORAL | 2 refills | Status: DC
Start: 2023-06-17 — End: 2023-09-16

## 2023-06-17 NOTE — Telephone Encounter (Signed)
 Prescription Request  06/17/2023  LOV: 09/28/2022  What is the name of the medication or equipment?   KLOR-CON M20 20 MEQ tablet [[id number]]   losartan-hydrochlorothiazide (HYZAAR) 100-12.5 MG tablet   Have you contacted your pharmacy to request a refill? Yes   Which pharmacy would you like this sent to?  [id number] [place], [place] - [address] [place] [address] Phone: [phone] Fax: [phone]    Patient notified that their request is being sent to the clinical staff for review and that they should receive a response within 2 business days.   Please advise pharmacist.

## 2023-06-17 NOTE — Telephone Encounter (Signed)
 Requested medications are due for refill today.  yes  Requested medications are on the active medications list.  yes  Last refill. 05/14/2023 #30 0 rf  Future visit scheduled.   no  Notes to clinic.  Pt needs an appt. Courtesy refill already given.    Requested Prescriptions  Pending Prescriptions Disp Refills   omeprazole (PRILOSEC) 40 MG capsule [Pharmacy Med Name: OMEPRAZOLE DR 40 MG CAPSULE] 30 capsule 0    Sig: Take 1 capsule (40 mg total) by mouth daily.     Gastroenterology: Proton Pump Inhibitors Failed - 06/17/2023 12:54 PM      Failed - Valid encounter within last 12 months    Recent Outpatient Visits           2 years ago Benign essential HTN   [facility] Tanya Nones Priscille Heidelberg, MD   2 years ago Benign essential HTN   [facility] Tanya Nones Priscille Heidelberg, MD   3 years ago Benign essential HTN   [facility] Donita Brooks, MD   3 years ago Breast pain in female   [facility] Elmore Guise, FNP   3 years ago Adrenal adenoma, right   [facility] Pickard, Priscille Heidelberg, MD

## 2023-07-02 ENCOUNTER — Ambulatory Visit: Admitting: Family Medicine

## 2023-07-02 ENCOUNTER — Encounter: Payer: Self-pay | Admitting: Family Medicine

## 2023-07-02 ENCOUNTER — Other Ambulatory Visit: Payer: Self-pay | Admitting: Family Medicine

## 2023-07-02 VITALS — BP 132/80 | HR 58 | Temp 98.1°F | Ht 61.0 in | Wt 270.0 lb

## 2023-07-02 DIAGNOSIS — Z0001 Encounter for general adult medical examination with abnormal findings: Secondary | ICD-10-CM

## 2023-07-02 DIAGNOSIS — I10 Essential (primary) hypertension: Secondary | ICD-10-CM | POA: Diagnosis not present

## 2023-07-02 DIAGNOSIS — G4733 Obstructive sleep apnea (adult) (pediatric): Secondary | ICD-10-CM

## 2023-07-02 DIAGNOSIS — R7303 Prediabetes: Secondary | ICD-10-CM

## 2023-07-02 DIAGNOSIS — Z0000 Encounter for general adult medical examination without abnormal findings: Secondary | ICD-10-CM

## 2023-07-02 MED ORDER — TIRZEPATIDE-WEIGHT MANAGEMENT 2.5 MG/0.5ML ~~LOC~~ SOLN: 2.5000 mg | SUBCUTANEOUS | 1 refills | Status: DC

## 2023-07-02 NOTE — Progress Notes (Signed)
 Subjective:    Patient ID: Patricia [name], female    DOB: 1965-08-06, 58 y.o.   MRN: [id number]  HPI Patient is a very sweet 58 year old African-American female here today for a physical exam.  Her daughter is expecting in June. Patient has been battling left-sided sciatica and carpal tunnel syndrome in her right wrist.  Her blood pressure today is excellent.  Her immunizations are up-to-date including the shingles vaccine except for the pneumonia shot however she would like to defer that today as she is expecting to get a cortisone shot in her wrist within the next day or so.  Her colonoscopy was in 2021 and is not due again until 2031.  She gets her mammogram every June and her gynecologist.  Her Pap smear was performed last June.  She does have obstructive sleep apnea and she would like to try to get Zepbound covered again by her insurance as she has a history of obstructive sleep apnea and this medicine has been approved for this now.  She also has a history of prediabetes Past Medical History:  Diagnosis Date   Arthritis    "spine-DDD-intermittent sciatic pain"   Chronic cervicitis    resolved   Diabetes mellitus without complication (HCC)    diet controlled   GERD (gastroesophageal reflux disease)    Heart murmur    "benign"-routinely sees yearly - Dr. Wayne Sever, cardiology   History of kidney stones    multiple kidney stones-passed.   Hypertension    IBS (irritable bowel syndrome)    controlled with diet   Menorrhagia    resolved s/p Hysterectomy   Obesity    Sleep apnea    cpap used nightly   Vitamin D deficiency    Past Surgical History:  Procedure Laterality Date   BREAST LUMPECTOMY Left    benign hamartoma   CESAREAN SECTION     x1   COLONOSCOPY  10/19/2005   ENDOMETRIAL ABLATION     ESOPHAGOGASTRODUODENOSCOPY  12/21/2008   ESOPHAGOGASTRODUODENOSCOPY (EGD) WITH PROPOFOL N/A 10/08/2022   Procedure: ESOPHAGOGASTRODUODENOSCOPY (EGD) WITH PROPOFOL;  Surgeon:  Iva Boop, MD;  Location: [facility];  Service: Gastroenterology;  Laterality: N/A;   HYSTEROSCOPY WITH D & C     resection of endometrial polyp   LAPAROSCOPIC GASTRIC SLEEVE RESECTION N/A 03/12/2016   Procedure: LAPAROSCOPIC GASTRIC SLEEVE RESECTION WITH UPPER ENDO;  Surgeon: De Blanch Kinsinger, MD;  Location: [facility];  Service: General;  Laterality: N/A;   TUBAL LIGATION     Uterine Polyps Removed     VAGINAL HYSTERECTOMY     fibroids   Current Outpatient Medications on File Prior to Visit  Medication Sig Dispense Refill   ALPRAZolam (XANAX) 0.5 MG tablet Take 1 tablet (0.5 mg total) by mouth at bedtime as needed for anxiety or sleep. 30 tablet 0   atorvastatin (LIPITOR) 10 MG tablet TAKE 1 TABLET BY MOUTH EVERY DAY 90 tablet 2   fluticasone (FLONASE) 50 MCG/ACT nasal spray Place 2 sprays into both nostrils daily. 16 g 6   gabapentin (NEURONTIN) 100 MG capsule Take 1 capsule (100 mg total) by mouth 3 (three) times daily as needed (nerve pain). (Patient not taking: Reported on 10/04/2022) 90 capsule 3   hyoscyamine (LEVBID) 0.375 MG 12 hr tablet TAKE 1 TABLET BY MOUTH 2 TIMES DAILY 60 tablet 5   losartan-hydrochlorothiazide (HYZAAR) 100-12.5 MG tablet Take 1 tablet by mouth daily. 30 tablet 2   Multiple Vitamin (MULTIVITAMIN) capsule Take 1 capsule by mouth  daily.     omeprazole (PRILOSEC) 40 MG capsule TAKE 1 CAPSULE (40 MG TOTAL) BY MOUTH DAILY. 30 capsule 1   OVER THE COUNTER MEDICATION Take 1 capsule by mouth daily. Sea Moss Supplement     potassium chloride SA (KLOR-CON M20) 20 MEQ tablet Take 1 tablet (20 mEq total) by mouth daily. 30 tablet 2   predniSONE (DELTASONE) 10 MG tablet Take 1 tablet (10 mg total) by mouth daily with breakfast. (Patient not taking: Reported on 02/26/2023) 30 tablet 0   Probiotic Product (PROBIOTIC DAILY PO) Take 1 tablet by mouth daily.     No current facility-administered medications on file prior to visit.   No Known Allergies Social History    Socioeconomic History   Marital status: Widowed    Spouse name: Ladene Artist   Number of children: 2   Years of education: 16   Highest education level: Not on file  Occupational History   Occupation: QUALITY ANALYST    Employer: [employer]  Tobacco Use   Smoking status: Never   Smokeless tobacco: Never  Vaping Use   Vaping status: Never Used  Substance and Sexual Activity   Alcohol use: Yes    Alcohol/week: 0.0 standard drinks of alcohol    Comment: socially   Drug use: No   Sexual activity: Yes    Birth control/protection: Surgical  Other Topics Concern   Not on file  Social History Narrative   Widowed-2022, college education, 2 children    Nurse, 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[employer]    patient is right handed and consumes green tea daily   Never smoker no tobacco, no drug use, only rare to occasional alcohol   Social Drivers of Corporate investment banker Strain: Not on file  Food Insecurity: Not on file  Transportation Needs: Not on file  Physical Activity: Not on file  Stress: Not on file  Social Connections: Not on file  Intimate Partner Violence: Not on file   Family History  Problem Relation Age of Onset   Hypertension Mother    Diabetes Mother    Coronary artery disease Mother    Sudden death Mother    Hypertension Father    Cancer Brother        carcinoid tumer of the appendix   Colon cancer Neg Hx    Esophageal cancer Neg Hx    Stomach cancer Neg Hx    Rectal cancer Neg Hx    Colon polyps Neg Hx       Review of Systems  All other systems reviewed and are negative.      Objective:   Physical Exam Vitals reviewed.  Constitutional:      General: She is not in acute distress.    Appearance: She is well-developed. She is not diaphoretic.  HENT:     Head: Normocephalic and atraumatic.     Right Ear: External ear normal.     Left Ear: External ear normal.     Nose: Nose normal.     Mouth/Throat:     Pharynx: No oropharyngeal exudate.  Eyes:      General: No scleral icterus.       Right eye: No discharge.        Left eye: No discharge.     Conjunctiva/sclera: Conjunctivae normal.     Pupils: Pupils are equal, round, and reactive to light.  Neck:     Thyroid: No thyromegaly.     Vascular: No JVD.     Trachea:  No tracheal deviation.  Cardiovascular:     Rate and Rhythm: Normal rate and regular rhythm.     Heart sounds: Normal heart sounds. No murmur heard.    No friction rub. No gallop.  Pulmonary:     Effort: Pulmonary effort is normal. No respiratory distress.     Breath sounds: Normal breath sounds. No stridor. No wheezing or rales.  Chest:     Chest wall: No tenderness.  Abdominal:     General: Bowel sounds are normal. There is no distension.     Palpations: Abdomen is soft. There is no mass.     Tenderness: There is no abdominal tenderness. There is no guarding or rebound.     Hernia: No hernia is present.  Musculoskeletal:     Cervical back: Normal range of motion and neck supple.  Lymphadenopathy:     Cervical: No cervical adenopathy.  Skin:    General: Skin is warm.     Coloration: Skin is not pale.     Findings: No erythema or rash.  Neurological:     Mental Status: She is alert and oriented to person, place, and time.     Cranial Nerves: No cranial nerve deficit.     Sensory: No sensory deficit.     Motor: No abnormal muscle tone.     Coordination: Coordination normal.     Deep Tendon Reflexes: Reflexes normal.  Psychiatric:        Behavior: Behavior normal.        Thought Content: Thought content normal.           Assessment & Plan:  General medical exam - Plan: Hemoglobin A1c, CBC with Differential/Platelet, COMPLETE METABOLIC PANEL WITHOUT GFR, Lipid panel  Essential hypertension - Plan: Hemoglobin A1c, CBC with Differential/Platelet, COMPLETE METABOLIC PANEL WITHOUT GFR, Lipid panel  Prediabetes - Plan: Hemoglobin A1c, CBC with Differential/Platelet, COMPLETE METABOLIC PANEL WITHOUT GFR,  Lipid panel  Morbid obesity (HCC)  Obstructive sleep apnea syndrome Physical exam today is normal.  Her blood pressure is excellent.  Her cancer screening (colonoscopy and mammogram) are up-to-date.  Recommended the pneumonia vaccine but the patient defers that at the present time.  My biggest concern for her would be her weight.  She would benefit dramatically from Zepbound to achieve weight loss.  She has a history of obstructive sleep apnea, prediabetes, and hypertension.  Hopefully we can use these factors to appeal her insurance company to get them to approve Zepbound

## 2023-07-02 NOTE — Telephone Encounter (Signed)
 Requested medication (s) are due for refill today: Yes/  Requested medication (s) are on the active medication list: Yes  Last refill:  07/02/23  Future visit scheduled: Yes  Notes to clinic:  Unable to refill per protocol due to pharmacy request for an alternative medication.      Requested Prescriptions  Pending Prescriptions Disp Refills   ZEPBOUND 2.5 MG/0.5ML Pen [Pharmacy Med Name: ZEPBOUND 2.5 MG/0.5 ML PEN]  1    Sig: INJECT 2.5 MG SUBCUTANEOUSLY WEEKLY     Off-Protocol Failed - 07/02/2023  3:51 PM      Failed - Medication not assigned to a protocol, review manually.      Passed - Valid encounter within last 12 months    Recent Outpatient Visits           Today General medical exam   [place] [facility] Donita Brooks, MD   9 months ago Prediabetes   [facility] Tanya Nones, Priscille Heidelberg, MD   1 year ago Dysuria   [place] [facility] Tanya Nones, Priscille Heidelberg, MD   1 year ago Dysuria    [facility] Donita Brooks, MD   1 year ago Benign essential HTN    [facility] Pickard, Priscille Heidelberg, MD

## 2023-07-03 ENCOUNTER — Encounter: Payer: Self-pay | Admitting: Family Medicine

## 2023-07-03 LAB — CBC WITH DIFFERENTIAL/PLATELET
Absolute Lymphocytes: 1447 {cells}/uL (ref 850–3900)
Absolute Monocytes: 335 {cells}/uL (ref 200–950)
Basophils Absolute: 20 {cells}/uL (ref 0–200)
Basophils Relative: 0.5 %
Eosinophils Absolute: 70 {cells}/uL (ref 15–500)
Eosinophils Relative: 1.8 %
HCT: 38.5 % (ref 35.0–45.0)
Hemoglobin: 12.8 g/dL (ref 11.7–15.5)
MCH: 29.2 pg (ref 27.0–33.0)
MCHC: 33.2 g/dL (ref 32.0–36.0)
MCV: 87.7 fL (ref 80.0–100.0)
MPV: 10.1 fL (ref 7.5–12.5)
Monocytes Relative: 8.6 %
Neutro Abs: 2028 {cells}/uL (ref 1500–7800)
Neutrophils Relative %: 52 %
Platelets: 231 10*3/uL (ref 140–400)
RBC: 4.39 10*6/uL (ref 3.80–5.10)
RDW: 13 % (ref 11.0–15.0)
Total Lymphocyte: 37.1 %
WBC: 3.9 10*3/uL (ref 3.8–10.8)

## 2023-07-03 LAB — COMPLETE METABOLIC PANEL WITHOUT GFR
AG Ratio: 1.6 (calc) (ref 1.0–2.5)
ALT: 13 U/L (ref 6–29)
AST: 16 U/L (ref 10–35)
Albumin: 4 g/dL (ref 3.6–5.1)
Alkaline phosphatase (APISO): 86 U/L (ref 37–153)
BUN: 14 mg/dL (ref 7–25)
CO2: 27 mmol/L (ref 20–32)
Calcium: 9.2 mg/dL (ref 8.6–10.4)
Chloride: 105 mmol/L (ref 98–110)
Creat: 0.66 mg/dL (ref 0.50–1.03)
Globulin: 2.5 g/dL (ref 1.9–3.7)
Glucose, Bld: 96 mg/dL (ref 65–99)
Potassium: 3.8 mmol/L (ref 3.5–5.3)
Sodium: 141 mmol/L (ref 135–146)
Total Bilirubin: 0.5 mg/dL (ref 0.2–1.2)
Total Protein: 6.5 g/dL (ref 6.1–8.1)

## 2023-07-03 LAB — HEMOGLOBIN A1C
Hgb A1c MFr Bld: 6.2 %{Hb} — ABNORMAL HIGH (ref <5.7)
Mean Plasma Glucose: 131 mg/dL
eAG (mmol/L): 7.3 mmol/L

## 2023-07-03 LAB — LIPID PANEL
Cholesterol: 159 mg/dL (ref <200)
HDL: 68 mg/dL (ref >=50)
LDL Cholesterol (Calc): 73 mg/dL
Non-HDL Cholesterol (Calc): 91 mg/dL (ref <130)
Total CHOL/HDL Ratio: 2.3 (calc) (ref <5.0)
Triglycerides: 99 mg/dL (ref <150)

## 2023-08-01 DIAGNOSIS — G5603 Carpal tunnel syndrome, bilateral upper limbs: Secondary | ICD-10-CM | POA: Insufficient documentation

## 2023-08-02 ENCOUNTER — Other Ambulatory Visit: Payer: Self-pay

## 2023-08-02 DIAGNOSIS — E118 Type 2 diabetes mellitus with unspecified complications: Secondary | ICD-10-CM

## 2023-08-02 DIAGNOSIS — G4733 Obstructive sleep apnea (adult) (pediatric): Secondary | ICD-10-CM

## 2023-08-02 MED ORDER — ZEPBOUND 2.5 MG/0.5ML ~~LOC~~ SOAJ
2.5000 mg | SUBCUTANEOUS | 0 refills | Status: AC
Start: 2023-08-02 — End: ?

## 2023-08-16 ENCOUNTER — Other Ambulatory Visit: Payer: Self-pay

## 2023-08-16 NOTE — Telephone Encounter (Signed)
 Prescription Request  08/16/2023  LOV: 07/02/23  What is the name of the medication or equipment? omeprazole  (PRILOSEC) 40 MG capsule [[id number]]   Have you contacted your pharmacy to request a refill? Yes   Which pharmacy would you like this sent to?  [id number] [place] [facility], [place] - [address] [place] [address] Phone: [phone] Fax: [phone]    Patient notified that their request is being sent to the clinical staff for review and that they should receive a response within 2 business days.   Please advise at [facility] [phone]

## 2023-08-20 MED ORDER — OMEPRAZOLE 40 MG PO CPDR: 40.0000 mg | DELAYED_RELEASE_CAPSULE | Freq: Every day | ORAL | 3 refills | Status: AC

## 2023-08-20 NOTE — Telephone Encounter (Signed)
 Requested Prescriptions  Pending Prescriptions Disp Refills   omeprazole  (PRILOSEC) 40 MG capsule 90 capsule 3    Sig: Take 1 capsule (40 mg total) by mouth daily.     Gastroenterology: Proton Pump Inhibitors Passed - 08/20/2023 11:34 AM      Passed - Valid encounter within last 12 months    Recent Outpatient Visits           1 month ago General medical exam   [place] [facility] Austine Lefort, MD   10 months ago Prediabetes   [place] [facility] Cheril Cork, Cisco Crest, MD   1 year ago Dysuria   [place] [facility] Austine Lefort, MD   1 year ago Dysuria   [place] [facility] Austine Lefort, MD   1 year ago Benign essential HTN   [place] [facility] Pickard, Cisco Crest, MD

## 2023-09-02 DIAGNOSIS — N898 Other specified noninflammatory disorders of vagina: Secondary | ICD-10-CM | POA: Insufficient documentation

## 2023-09-15 ENCOUNTER — Other Ambulatory Visit: Payer: Self-pay | Admitting: Family Medicine

## 2023-09-15 DIAGNOSIS — I10 Essential (primary) hypertension: Secondary | ICD-10-CM

## 2023-10-17 ENCOUNTER — Encounter (HOSPITAL_COMMUNITY): Payer: Self-pay | Admitting: *Deleted

## 2023-11-08 ENCOUNTER — Ambulatory Visit: Attending: Cardiology | Admitting: Cardiology

## 2023-11-08 ENCOUNTER — Encounter: Payer: Self-pay | Admitting: Cardiology

## 2023-11-08 VITALS — BP 122/76 | HR 60 | Ht 61.5 in | Wt 265.2 lb

## 2023-11-08 DIAGNOSIS — I10 Essential (primary) hypertension: Secondary | ICD-10-CM | POA: Diagnosis not present

## 2023-11-08 DIAGNOSIS — R0789 Other chest pain: Secondary | ICD-10-CM | POA: Diagnosis not present

## 2023-11-08 DIAGNOSIS — R002 Palpitations: Secondary | ICD-10-CM | POA: Diagnosis not present

## 2023-11-08 DIAGNOSIS — E782 Mixed hyperlipidemia: Secondary | ICD-10-CM

## 2023-11-08 NOTE — Patient Instructions (Addendum)

## 2023-11-08 NOTE — Progress Notes (Signed)
 Clinical Summary Ms. Pulice is a 58 y.o.female seen today for follow up of the following medical probems.    1. Palpitations   - previous 7 day event monitor showed no signficant arrhythmias - no recent palpitations.     2. OSA - followed by neuro Dr Buck - remains compliant with cpap     3. SOB  - echo 09/2015 LVEF 60-65%, normal diastolic function.    02/2020 coronary CTA calcium  score 0, mild nonobstructive plaque   - breathing had improved significantly with prior weight loss. With recurrent weight gain has had some recurrent SOB  - no recent SOB/DOE      4. Severe obesity - s/p bariatric surgery  - had significant weight loss initially - recent weight gain related to stress from recent passing of her husband  - reports insurance denying moujaro    5. Chest pain - 02/2020 coronary CTA calcium  score 0, mild nonobstructive plaque - no recent chest pains   6. 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 Graduated from [employer] , active alumnus Husband passed pancreatic cancer 2 years ago From [place], [place] originally.  Past Medical History:  Diagnosis Date   Arthritis    spine-DDD-intermittent sciatic pain   Chronic cervicitis    resolved   Diabetes mellitus without complication (HCC)    diet controlled   GERD (gastroesophageal reflux disease)    Heart murmur    benign-routinely sees yearly - Dr. Ethel Ross, cardiology   History of kidney stones    multiple kidney stones-passed.   Hypertension    IBS (irritable bowel syndrome)    controlled with diet   Menorrhagia    resolved s/p Hysterectomy   Obesity    Sleep apnea    cpap used nightly   Vitamin D  deficiency      No Known Allergies   Current Outpatient Medications  Medication Sig Dispense Refill   ALPRAZolam  (XANAX ) 0.5 MG tablet Take 1 tablet  (0.5 mg total) by mouth at bedtime as needed for anxiety or sleep. 30 tablet 0   atorvastatin  (LIPITOR) 10 MG tablet TAKE 1 TABLET BY MOUTH EVERY DAY 90 tablet 2   fluticasone  (FLONASE ) 50 MCG/ACT nasal spray Place 2 sprays into both nostrils daily. 16 g 6   gabapentin  (NEURONTIN ) 100 MG capsule Take 1 capsule (100 mg total) by mouth 3 (three) times daily as needed (nerve pain). (Patient not taking: Reported on 07/02/2023) 90 capsule 3   hyoscyamine  (LEVBID ) 0.375 MG 12 hr tablet TAKE 1 TABLET BY MOUTH 2 TIMES DAILY 60 tablet 5   losartan -hydrochlorothiazide  (HYZAAR) 100-12.5 MG tablet TAKE 1 TABLET BY MOUTH EVERY DAY 30 tablet 2   Multiple Vitamin (MULTIVITAMIN) capsule Take 1 capsule by mouth daily.     omeprazole  (PRILOSEC) 40 MG capsule Take 1 capsule (40 mg total) by mouth daily. 90 capsule 3   OVER THE COUNTER MEDICATION Take 1 capsule by mouth daily. Sea Moss Supplement     potassium chloride  SA (KLOR-CON  M) 20 MEQ tablet TAKE 1 TABLET BY MOUTH EVERY DAY 30 tablet 2   Probiotic Product (PROBIOTIC DAILY PO) Take 1 tablet by mouth daily.     tirzepatide  (ZEPBOUND ) 2.5 MG/0.5ML Pen Inject 2.5 mg into the skin once a week. 2 mL 0   No  current facility-administered medications for this visit.     Past Surgical History:  Procedure Laterality Date   BREAST LUMPECTOMY Left    benign hamartoma   CESAREAN SECTION     x1   COLONOSCOPY  10/19/2005   ENDOMETRIAL ABLATION     ESOPHAGOGASTRODUODENOSCOPY  12/21/2008   ESOPHAGOGASTRODUODENOSCOPY (EGD) WITH PROPOFOL  N/A 10/08/2022   Procedure: ESOPHAGOGASTRODUODENOSCOPY (EGD) WITH PROPOFOL ;  Surgeon: Avram Lupita BRAVO, MD;  Location: [facility];  Service: Gastroenterology;  Laterality: N/A;   HYSTEROSCOPY WITH D & C     resection of endometrial polyp   LAPAROSCOPIC GASTRIC SLEEVE RESECTION N/A 03/12/2016   Procedure: LAPAROSCOPIC GASTRIC SLEEVE RESECTION WITH UPPER ENDO;  Surgeon: Herlene Righter Kinsinger, MD;  Location: [facility];  Service: General;   Laterality: N/A;   TUBAL LIGATION     Uterine Polyps Removed     VAGINAL HYSTERECTOMY     fibroids     No Known Allergies    Family History  Problem Relation Age of Onset   Hypertension Mother    Diabetes Mother    Coronary artery disease Mother    Sudden death Mother    Hypertension Father    Cancer Brother        carcinoid tumer of the appendix   Colon cancer Neg Hx    Esophageal cancer Neg Hx    Stomach cancer Neg Hx    Rectal cancer Neg Hx    Colon polyps Neg Hx      Social History Ms. Petsch reports that she has never smoked. She has never used smokeless tobacco. Ms. Bookwalter reports current alcohol use.    Physical Examination Today's Vitals   11/08/23 1203  BP: 122/76  Pulse: 60  SpO2: 99%  Weight: 265 lb 3.2 oz (120.3 kg)  Height: 5' 1.5 (1.562 m)  PainSc: 0-No pain   Body mass index is 49.3 kg/m.  Gen: resting comfortably, no acute distress HEENT: no scleral icterus, pupils equal round and reactive, no palptable cervical adenopathy,  CV: RRR, no mrg, no jvd Resp: Clear to auscultation bilaterally GI: abdomen is soft, non-tender, non-distended, normal bowel sounds, no hepatosplenomegaly MSK: extremities are warm, no edema.  Skin: warm, no rash Neuro:  no focal deficits Psych: appropriate affect   Diagnostic Studies  03/2013 Event Monitor 7 Days No symptoms, normal sinus rhythm   09/2015 echo Study Conclusions   - Left ventricle: The cavity size was normal. Wall thickness was   normal. Systolic function was normal. The estimated ejection   fraction was in the range of 60% to 65%. Wall motion was normal;   there were no regional wall motion abnormalities. Left   ventricular diastolic function parameters were normal for the   patient&'s age. - Right atrium: Central venous pressure (est): 3 mm Hg. - Atrial septum: No defect or patent foramen ovale was identified. - Tricuspid valve: There was mild regurgitation. - Pulmonary arteries: PA peak  pressure: 32 mm Hg (S). - Pericardium, extracardiac: There was no pericardial effusion.   Impressions:   - Normal LV wall thickness with LVEF 60-65%. Normal diastolic   function. Mild tricuspid regurgitation with PASP 32 mmHg.   02/2020 coronary CTA 1. Coronary calcium  score of 0. This was 1st percentile for age, sex, and race matched control.   2. Normal coronary origin with right dominance.   3. Ascending aortic atherosclerosis noted.   4. CAD-RADS 2. Mild non-obstructive CAD (25-49%). Consider non-atherosclerotic causes of chest pain. Consider preventive therapy and risk factor  modification.   Assessment and Plan   1. Palpitations   - benign event monitor previously - no recent signifcant symptoms, continue to monitor   2. SOB -benign previous cardiac workup - SOB fluctuates with weight gain and loss - chronic stable symptoms, continue to monitor at this time.    3. Chest pain - no recent symptoms, benign coronary CTA in 2021 - no recent symptoms - EKG today shows SR, no ischemic changes  4. Hyperlipidemia - lipids at goal, continue atorvastatin      Dorn PHEBE Ross, M.D..

## 2023-11-26 ENCOUNTER — Other Ambulatory Visit: Payer: Self-pay | Admitting: Family Medicine

## 2023-11-26 DIAGNOSIS — G4733 Obstructive sleep apnea (adult) (pediatric): Secondary | ICD-10-CM

## 2023-12-25 ENCOUNTER — Other Ambulatory Visit: Payer: Self-pay | Admitting: Internal Medicine

## 2023-12-26 ENCOUNTER — Other Ambulatory Visit: Payer: Self-pay

## 2023-12-26 ENCOUNTER — Telehealth: Payer: Self-pay

## 2023-12-26 MED ORDER — LOSARTAN POTASSIUM-HCTZ 100-12.5 MG PO TABS: 1.0000 | ORAL_TABLET | Freq: Every day | ORAL | 2 refills | Status: DC

## 2023-12-26 NOTE — Telephone Encounter (Signed)
 Sent in medication

## 2023-12-26 NOTE — Telephone Encounter (Addendum)
 Prescription Request  12/26/2023  LOV: 07/02/23  What is the name of the medication or equipment? losartan -hydrochlorothiazide  (HYZAAR) 100-12.5 MG tablet   Have you contacted your pharmacy to request a refill? Yes   Which pharmacy would you like this sent to?  [id number] [place], [place] - [address] [place] [address] Phone: [phone] Fax: [phone]    Patient notified that their request is being sent to the clinical staff for review and that they should receive a response within 2 business days.   Please advise at [facility] [phone]  Prescription Request  12/26/2023  LOV: 07/02/23  What is the name of the medication or equipment? potassium chloride  SA (KLOR-CON  M) 20 MEQ tablet [[id number]]   Have you contacted your pharmacy to request a refill? Yes   Which pharmacy would you like this sent to?  [id number] [place], [place] - [address]  [place] [address] Phone: [phone] Fax: [phone]    Patient notified that their request is being sent to the clinical staff for review and that they should receive a response within 2 business days.   Please advise at [facility] [phone]

## 2023-12-27 ENCOUNTER — Other Ambulatory Visit: Payer: Self-pay

## 2023-12-27 ENCOUNTER — Telehealth: Payer: Self-pay

## 2023-12-27 DIAGNOSIS — I10 Essential (primary) hypertension: Secondary | ICD-10-CM

## 2023-12-27 MED ORDER — POTASSIUM CHLORIDE CRYS ER 20 MEQ PO TBCR: 20.0000 meq | EXTENDED_RELEASE_TABLET | Freq: Every day | ORAL | 2 refills | Status: DC

## 2023-12-27 NOTE — Telephone Encounter (Signed)
 Prescription Request  12/27/2023  LOV: 07/02/23  What is the name of the medication or equipment? potassium chloride  SA (KLOR-CON  M) 20 MEQ tablet [[id number]]   Have you contacted your pharmacy to request a refill? Yes   Which pharmacy would you like this sent to?  [id number] [place], [place] - [address] [place] [address] Phone: [phone] Fax: [phone]    Patient notified that their request is being sent to the clinical staff for review and that they should receive a response within 2 business days.   Please advise at [facility] [phone]

## 2023-12-30 ENCOUNTER — Ambulatory Visit: Admitting: Family Medicine

## 2024-01-02 ENCOUNTER — Ambulatory Visit: Admitting: Family Medicine

## 2024-01-02 ENCOUNTER — Encounter: Payer: Self-pay | Admitting: Family Medicine

## 2024-01-02 VITALS — BP 126/72 | HR 69 | Temp 98.0°F | Ht 61.5 in | Wt 264.4 lb

## 2024-01-02 DIAGNOSIS — Z124 Encounter for screening for malignant neoplasm of cervix: Secondary | ICD-10-CM | POA: Diagnosis not present

## 2024-01-02 DIAGNOSIS — R6882 Decreased libido: Secondary | ICD-10-CM | POA: Insufficient documentation

## 2024-01-02 DIAGNOSIS — R4586 Emotional lability: Secondary | ICD-10-CM | POA: Insufficient documentation

## 2024-01-02 DIAGNOSIS — R7303 Prediabetes: Secondary | ICD-10-CM

## 2024-01-02 DIAGNOSIS — Z23 Encounter for immunization: Secondary | ICD-10-CM

## 2024-01-02 DIAGNOSIS — K219 Gastro-esophageal reflux disease without esophagitis: Secondary | ICD-10-CM

## 2024-01-02 DIAGNOSIS — R232 Flushing: Secondary | ICD-10-CM | POA: Insufficient documentation

## 2024-01-02 DIAGNOSIS — R5383 Other fatigue: Secondary | ICD-10-CM | POA: Insufficient documentation

## 2024-01-02 DIAGNOSIS — E782 Mixed hyperlipidemia: Secondary | ICD-10-CM | POA: Insufficient documentation

## 2024-01-02 DIAGNOSIS — M2550 Pain in unspecified joint: Secondary | ICD-10-CM | POA: Insufficient documentation

## 2024-01-02 DIAGNOSIS — G479 Sleep disorder, unspecified: Secondary | ICD-10-CM | POA: Insufficient documentation

## 2024-01-02 DIAGNOSIS — N951 Menopausal and female climacteric states: Secondary | ICD-10-CM | POA: Insufficient documentation

## 2024-01-02 NOTE — Progress Notes (Signed)
 Subjective:    Patient ID: Patricia [name], female    DOB: January 12, 1966, 58 y.o.   MRN: [id number] Patient is a very sweet 58 year old African-American female who presents today for a checkup.  She has a history of prediabetes.  She is requesting for 6 months labs to monitor her A1c and her cholesterol.  Her blood pressure today is outstanding at 126/72.  She is due for a flu shot.  She is also due for Prevnar 20.  Of note, she has been having more heartburn recently.  She has been exposed to H. pylori.  By close contact.  She is requesting H. pylori screening.  Her gynecologist is performing her mammograms and pelvic exams and Pap smears.  Otherwise she is doing well with no concern  Past Medical History:  Diagnosis Date   Arthritis    spine-DDD-intermittent sciatic pain   Chronic cervicitis    resolved   Diabetes mellitus without complication (HCC)    diet controlled   GERD (gastroesophageal reflux disease)    Heart murmur    benign-routinely sees yearly - Dr. Ethel Ross, cardiology   History of kidney stones    multiple kidney stones-passed.   Hypertension    IBS (irritable bowel syndrome)    controlled with diet   Menorrhagia    resolved s/p Hysterectomy   Obesity    Sleep apnea    cpap used nightly   Vitamin D  deficiency    Past Surgical History:  Procedure Laterality Date   BREAST LUMPECTOMY Left    benign hamartoma   CESAREAN SECTION     x1   COLONOSCOPY  10/19/2005   ENDOMETRIAL ABLATION     ESOPHAGOGASTRODUODENOSCOPY  12/21/2008   ESOPHAGOGASTRODUODENOSCOPY (EGD) WITH PROPOFOL  N/A 10/08/2022   Procedure: ESOPHAGOGASTRODUODENOSCOPY (EGD) WITH PROPOFOL ;  Surgeon: Avram Lupita FORBES, MD;  Location: [facility];  Service: Gastroenterology;  Laterality: N/A;   HYSTEROSCOPY WITH D & C     resection of endometrial polyp   LAPAROSCOPIC GASTRIC SLEEVE RESECTION N/A 03/12/2016   Procedure: LAPAROSCOPIC GASTRIC SLEEVE RESECTION WITH UPPER ENDO;  Surgeon: Herlene Righter  Kinsinger, MD;  Location: [facility];  Service: General;  Laterality: N/A;   TUBAL LIGATION     Uterine Polyps Removed     VAGINAL HYSTERECTOMY     fibroids   Current Outpatient Medications on File Prior to Visit  Medication Sig Dispense Refill   ALPRAZolam  (XANAX ) 0.5 MG tablet Take 1 tablet (0.5 mg total) by mouth at bedtime as needed for anxiety or sleep. 30 tablet 0   atorvastatin  (LIPITOR) 10 MG tablet TAKE 1 TABLET BY MOUTH EVERY DAY 90 tablet 2   fluticasone  (FLONASE ) 50 MCG/ACT nasal spray Place 2 sprays into both nostrils daily. 16 g 6   hyoscyamine  (LEVBID ) 0.375 MG 12 hr tablet TAKE 1 TABLET BY MOUTH 2 TIMES DAILY 60 tablet 1   losartan -hydrochlorothiazide  (HYZAAR) 100-12.5 MG tablet Take 1 tablet by mouth daily. 30 tablet 2   Multiple Vitamin (MULTIVITAMIN) capsule Take 1 capsule by mouth daily.     omeprazole  (PRILOSEC) 40 MG capsule Take 1 capsule (40 mg total) by mouth daily. 90 capsule 3   OVER THE COUNTER MEDICATION Take 1 capsule by mouth daily. Sea Moss Supplement     potassium chloride  SA (KLOR-CON  M) 20 MEQ tablet Take 1 tablet (20 mEq total) by mouth daily. 30 tablet 2   Probiotic Product (PROBIOTIC DAILY PO) Take 1 tablet by mouth daily.     gabapentin  (NEURONTIN )  100 MG capsule Take 1 capsule (100 mg total) by mouth 3 (three) times daily as needed (nerve pain). (Patient not taking: Reported on 11/08/2023) 90 capsule 3   tirzepatide  (ZEPBOUND ) 2.5 MG/0.5ML Pen Inject 2.5 mg into the skin once a week. (Patient not taking: Reported on 01/02/2024) 2 mL 0   No current facility-administered medications on file prior to visit.   No Known Allergies Social History   Socioeconomic History   Marital status: Widowed    Spouse name: Ronalee   Number of children: 2   Years of education: 16   Highest education level: Not on file  Occupational History   Occupation: QUALITY ANALYST    Employer: [employer]  Tobacco Use   Smoking status: Never   Smokeless tobacco: Never   Vaping Use   Vaping status: Never Used  Substance and Sexual Activity   Alcohol use: Yes    Alcohol/week: 0.0 standard drinks of alcohol    Comment: socially   Drug use: No   Sexual activity: Yes    Birth control/protection: Surgical  Other Topics Concern   Not on file  Social History Narrative   Widowed-2022, college education, 2 children    Nurse, 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[employer]    patient is right handed and consumes green tea daily   Never smoker no tobacco, no drug use, only rare to occasional alcohol   Social Drivers of Corporate investment banker Strain: Not on file  Food Insecurity: Not on file  Transportation Needs: Not on file  Physical Activity: Not on file  Stress: Not on file  Social Connections: Not on file  Intimate Partner Violence: Not on file   Family History  Problem Relation Age of Onset   Hypertension Mother    Diabetes Mother    Coronary artery disease Mother    Sudden death Mother    Hypertension Father    Cancer Brother        carcinoid tumer of the appendix   Colon cancer Neg Hx    Esophageal cancer Neg Hx    Stomach cancer Neg Hx    Rectal cancer Neg Hx    Colon polyps Neg Hx       Review of Systems  All other systems reviewed and are negative.      Objective:   Physical Exam Vitals reviewed.  Constitutional:      General: She is not in acute distress.    Appearance: She is well-developed. She is not diaphoretic.  HENT:     Head: Normocephalic and atraumatic.  Eyes:     General: No scleral icterus.       Right eye: No discharge.        Left eye: No discharge.     Conjunctiva/sclera: Conjunctivae normal.     Pupils: Pupils are equal, round, and reactive to light.  Neck:     Thyroid : No thyromegaly.     Vascular: No JVD.     Trachea: No tracheal deviation.  Cardiovascular:     Rate and Rhythm: Normal rate and regular rhythm.     Heart sounds: Normal heart sounds. No murmur heard.    No friction rub. No gallop.  Pulmonary:      Effort: Pulmonary effort is normal. No respiratory distress.     Breath sounds: Normal breath sounds. No stridor. No wheezing or rales.  Chest:     Chest wall: No tenderness.  Abdominal:     General: Bowel sounds are normal. There  is no distension.     Palpations: Abdomen is soft. There is no mass.     Tenderness: There is no abdominal tenderness. There is no guarding or rebound.     Hernia: No hernia is present.  Musculoskeletal:     Cervical back: Normal range of motion and neck supple.  Lymphadenopathy:     Cervical: No cervical adenopathy.  Skin:    General: Skin is warm.     Coloration: Skin is not pale.     Findings: No erythema or rash. Rash is not macular, papular, pustular, scaling, urticarial or vesicular.  Neurological:     Mental Status: She is alert and oriented to person, place, and time.     Cranial Nerves: No cranial nerve deficit.     Sensory: No sensory deficit.     Motor: No abnormal muscle tone.     Coordination: Coordination normal.     Deep Tendon Reflexes: Reflexes normal.  Psychiatric:        Behavior: Behavior normal.        Thought Content: Thought content normal.           Assessment & Plan:  Gastroesophageal reflux disease without esophagitis - Plan: H. pylori breath test  Prediabetes - Plan: CBC with Differential/Platelet, Comprehensive metabolic panel with GFR, Lipid panel, Hemoglobin A1c Patient received her flu shot today.  She also received Prevnar 20.  The remainder of her preventative care is up-to-date.  Her blood pressure is excellent.  I will check an A1c because of the history of prediabetes and monitor her cholesterol.  Patient request H. pylori breath test due to worsening GERD and the fact she has been exposed to H. pylori in a close contact.  I will be happy to perform that test for her.

## 2024-01-02 NOTE — Addendum Note (Signed)
 Addended by: ANGELENA RONAL BRADLEY K on: 01/02/2024 11:46 AM   Modules accepted: Orders

## 2024-01-03 ENCOUNTER — Ambulatory Visit: Payer: Self-pay | Admitting: Family Medicine

## 2024-01-03 LAB — COMPREHENSIVE METABOLIC PANEL WITH GFR
AG Ratio: 1.5 (calc) (ref 1.0–2.5)
ALT: 15 U/L (ref 6–29)
AST: 18 U/L (ref 10–35)
Albumin: 4.2 g/dL (ref 3.6–5.1)
Alkaline phosphatase (APISO): 100 U/L (ref 37–153)
BUN: 14 mg/dL (ref 7–25)
CO2: 25 mmol/L (ref 20–32)
Calcium: 9.6 mg/dL (ref 8.6–10.4)
Chloride: 106 mmol/L (ref 98–110)
Creat: 0.76 mg/dL (ref 0.50–1.03)
Globulin: 2.8 g/dL (ref 1.9–3.7)
Glucose, Bld: 91 mg/dL (ref 65–99)
Potassium: 4.2 mmol/L (ref 3.5–5.3)
Sodium: 142 mmol/L (ref 135–146)
Total Bilirubin: 0.4 mg/dL (ref 0.2–1.2)
Total Protein: 7 g/dL (ref 6.1–8.1)
eGFR: 92 mL/min/1.73m2 (ref >=60)

## 2024-01-03 LAB — LIPID PANEL
Cholesterol: 164 mg/dL (ref <200)
HDL: 61 mg/dL (ref >=50)
LDL Cholesterol (Calc): 84 mg/dL
Non-HDL Cholesterol (Calc): 103 mg/dL (ref <130)
Total CHOL/HDL Ratio: 2.7 (calc) (ref <5.0)
Triglycerides: 95 mg/dL (ref <150)

## 2024-01-03 LAB — CBC WITH DIFFERENTIAL/PLATELET
Absolute Lymphocytes: 1474 {cells}/uL (ref 850–3900)
Absolute Monocytes: 298 {cells}/uL (ref 200–950)
Basophils Absolute: 29 {cells}/uL (ref 0–200)
Basophils Relative: 0.7 %
Eosinophils Absolute: 80 {cells}/uL (ref 15–500)
Eosinophils Relative: 1.9 %
HCT: 40.2 % (ref 35.0–45.0)
Hemoglobin: 13.8 g/dL (ref 11.7–15.5)
MCH: 29.6 pg (ref 27.0–33.0)
MCHC: 34.3 g/dL (ref 32.0–36.0)
MCV: 86.1 fL (ref 80.0–100.0)
MPV: 10.3 fL (ref 7.5–12.5)
Monocytes Relative: 7.1 %
Neutro Abs: 2318 {cells}/uL (ref 1500–7800)
Neutrophils Relative %: 55.2 %
Platelets: 259 Thousand/uL (ref 140–400)
RBC: 4.67 Million/uL (ref 3.80–5.10)
RDW: 13.4 % (ref 11.0–15.0)
Total Lymphocyte: 35.1 %
WBC: 4.2 Thousand/uL (ref 3.8–10.8)

## 2024-01-03 LAB — HEMOGLOBIN A1C
Hgb A1c MFr Bld: 6.1 % — ABNORMAL HIGH (ref <5.7)
Mean Plasma Glucose: 128 mg/dL
eAG (mmol/L): 7.1 mmol/L

## 2024-01-03 LAB — H. PYLORI BREATH TEST: H. pylori Breath Test: NOT DETECTED

## 2024-01-23 ENCOUNTER — Encounter: Payer: Self-pay | Admitting: Family Medicine

## 2024-01-27 ENCOUNTER — Ambulatory Visit: Admitting: Family Medicine

## 2024-01-27 ENCOUNTER — Encounter: Payer: Self-pay | Admitting: Radiology

## 2024-01-27 ENCOUNTER — Encounter: Payer: Self-pay | Admitting: Family Medicine

## 2024-01-27 VITALS — BP 128/74 | HR 81 | Temp 98.0°F | Ht 61.5 in | Wt 265.8 lb

## 2024-01-27 DIAGNOSIS — R300 Dysuria: Secondary | ICD-10-CM

## 2024-01-27 LAB — URINALYSIS, ROUTINE W REFLEX MICROSCOPIC
Bilirubin Urine: NEGATIVE
Glucose, UA: NEGATIVE
Hyaline Cast: NONE SEEN /LPF
Ketones, ur: NEGATIVE
Nitrite: NEGATIVE
Protein, ur: NEGATIVE
Specific Gravity, Urine: 1.025 (ref 1.001–1.035)
pH: 5.5 (ref 5.0–8.0)

## 2024-01-27 LAB — MICROSCOPIC MESSAGE

## 2024-01-27 MED ORDER — NITROFURANTOIN MONOHYD MACRO 100 MG PO CAPS: 100.0000 mg | ORAL_CAPSULE | Freq: Two times a day (BID) | ORAL | 0 refills | Status: AC

## 2024-01-27 NOTE — Progress Notes (Signed)
 Subjective:    Patient ID: Patricia [name], female    DOB: 1966/01/28, 58 y.o.   MRN: [id number] Patient is a very sweet 58 year old African-American female who presents today complaining of dysuria.  She also reports foul odor in her urine for several days.  She went to her gynecologist and was told it was not a bladder infection.  She states she has a history of having UTIs and having the dipstick being negative.  Today on her urinalysis she has trace blood and trace leukocyte esterase with negative nitrates.  However microscopic analysis reveals numerous white blood cells.  She is in a new romantic relationship so she is concerned about possible STI exposure.  She denies any vaginal discharge or vaginal bleeding.  Past Medical History:  Diagnosis Date   Arthritis    spine-DDD-intermittent sciatic pain   Chronic cervicitis    resolved   Diabetes mellitus without complication (HCC)    diet controlled   GERD (gastroesophageal reflux disease)    Heart murmur    benign-routinely sees yearly - Dr. Ethel Ross, cardiology   History of kidney stones    multiple kidney stones-passed.   Hypertension    IBS (irritable bowel syndrome)    controlled with diet   Menorrhagia    resolved s/p Hysterectomy   Obesity    Sleep apnea    cpap used nightly   Vitamin D  deficiency    Past Surgical History:  Procedure Laterality Date   BREAST LUMPECTOMY Left    benign hamartoma   CESAREAN SECTION     x1   COLONOSCOPY  10/19/2005   ENDOMETRIAL ABLATION     ESOPHAGOGASTRODUODENOSCOPY  12/21/2008   ESOPHAGOGASTRODUODENOSCOPY (EGD) WITH PROPOFOL  N/A 10/08/2022   Procedure: ESOPHAGOGASTRODUODENOSCOPY (EGD) WITH PROPOFOL ;  Surgeon: Avram Lupita FORBES, MD;  Location: [facility];  Service: Gastroenterology;  Laterality: N/A;   HYSTEROSCOPY WITH D & C     resection of endometrial polyp   LAPAROSCOPIC GASTRIC SLEEVE RESECTION N/A 03/12/2016   Procedure: LAPAROSCOPIC GASTRIC SLEEVE RESECTION WITH  UPPER ENDO;  Surgeon: Herlene Righter Kinsinger, MD;  Location: [facility];  Service: General;  Laterality: N/A;   TUBAL LIGATION     Uterine Polyps Removed     VAGINAL HYSTERECTOMY     fibroids   Current Outpatient Medications on File Prior to Visit  Medication Sig Dispense Refill   ALPRAZolam  (XANAX ) 0.5 MG tablet Take 1 tablet (0.5 mg total) by mouth at bedtime as needed for anxiety or sleep. 30 tablet 0   atorvastatin  (LIPITOR) 10 MG tablet TAKE 1 TABLET BY MOUTH EVERY DAY 90 tablet 2   fluticasone  (FLONASE ) 50 MCG/ACT nasal spray Place 2 sprays into both nostrils daily. 16 g 6   hyoscyamine  (LEVBID ) 0.375 MG 12 hr tablet TAKE 1 TABLET BY MOUTH 2 TIMES DAILY 60 tablet 1   losartan -hydrochlorothiazide  (HYZAAR) 100-12.5 MG tablet Take 1 tablet by mouth daily. 30 tablet 2   Multiple Vitamin (MULTIVITAMIN) capsule Take 1 capsule by mouth daily.     omeprazole  (PRILOSEC) 40 MG capsule Take 1 capsule (40 mg total) by mouth daily. 90 capsule 3   OVER THE COUNTER MEDICATION Take 1 capsule by mouth daily. Sea Moss Supplement     potassium chloride  SA (KLOR-CON  M) 20 MEQ tablet Take 1 tablet (20 mEq total) by mouth daily. 30 tablet 2   Probiotic Product (PROBIOTIC DAILY PO) Take 1 tablet by mouth daily.     tirzepatide  (ZEPBOUND ) 2.5 MG/0.5ML Pen Inject 2.5  mg into the skin once a week. (Patient not taking: Reported on 01/27/2024) 2 mL 0   No current facility-administered medications on file prior to visit.   No Known Allergies Social History   Socioeconomic History   Marital status: Widowed    Spouse name: Ronalee   Number of children: 2   Years of education: 16   Highest education level: Not on file  Occupational History   Occupation: QUALITY ANALYST    Employer: [employer]  Tobacco Use   Smoking status: Never   Smokeless tobacco: Never  Vaping Use   Vaping status: Never Used  Substance and Sexual Activity   Alcohol use: Yes    Alcohol/week: 0.0 standard drinks of alcohol     Comment: socially   Drug use: No   Sexual activity: Yes    Birth control/protection: Surgical  Other Topics Concern   Not on file  Social History Narrative   Widowed-2022, college education, 2 children    Nurse, 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[employer]    patient is right handed and consumes green tea daily   Never smoker no tobacco, no drug use, only rare to occasional alcohol   Social Drivers of Corporate Investment Banker Strain: Not on file  Food Insecurity: Not on file  Transportation Needs: Not on file  Physical Activity: Not on file  Stress: Not on file  Social Connections: Not on file  Intimate Partner Violence: Not on file   Family History  Problem Relation Age of Onset   Hypertension Mother    Diabetes Mother    Coronary artery disease Mother    Sudden death Mother    Hypertension Father    Cancer Brother        carcinoid tumer of the appendix   Colon cancer Neg Hx    Esophageal cancer Neg Hx    Stomach cancer Neg Hx    Rectal cancer Neg Hx    Colon polyps Neg Hx       Review of Systems  All other systems reviewed and are negative.      Objective:   Physical Exam Vitals reviewed.  Constitutional:      General: She is not in acute distress.    Appearance: She is well-developed. She is not diaphoretic.  HENT:     Head: Normocephalic and atraumatic.  Eyes:     General: No scleral icterus.       Right eye: No discharge.        Left eye: No discharge.     Conjunctiva/sclera: Conjunctivae normal.     Pupils: Pupils are equal, round, and reactive to light.  Neck:     Thyroid : No thyromegaly.     Vascular: No JVD.     Trachea: No tracheal deviation.  Cardiovascular:     Rate and Rhythm: Normal rate and regular rhythm.     Heart sounds: Normal heart sounds. No murmur heard.    No friction rub. No gallop.  Pulmonary:     Effort: Pulmonary effort is normal. No respiratory distress.     Breath sounds: Normal breath sounds. No stridor. No wheezing or rales.   Chest:     Chest wall: No tenderness.  Abdominal:     General: Bowel sounds are normal. There is no distension.     Palpations: Abdomen is soft. There is no mass.     Tenderness: There is no abdominal tenderness. There is no guarding or rebound.     Hernia: No  hernia is present.  Musculoskeletal:     Cervical back: Normal range of motion and neck supple.  Lymphadenopathy:     Cervical: No cervical adenopathy.  Skin:    General: Skin is warm.     Coloration: Skin is not pale.     Findings: No erythema or rash. Rash is not macular, papular, pustular, scaling, urticarial or vesicular.  Neurological:     Mental Status: She is alert and oriented to person, place, and time.     Cranial Nerves: No cranial nerve deficit.     Sensory: No sensory deficit.     Motor: No abnormal muscle tone.     Coordination: Coordination normal.     Deep Tendon Reflexes: Reflexes normal.  Psychiatric:        Behavior: Behavior normal.        Thought Content: Thought content normal.           Assessment & Plan:  Dysuria - Plan: Urinalysis, Routine w reflex microscopic, Urine Culture Patient has a history of resistant E. coli urinary tract infections.  Last culture showed resistance to Levaquin, Cipro , Bactrim , and amoxicillin .  I will start the patient on Macrobid  100 mg twice daily for 7 days.  I will send her urine for culture and sensitivities.  Will also send the urine for gonorrhea and Chlamydia testing.

## 2024-01-28 LAB — C. TRACHOMATIS/N. GONORRHOEAE RNA
C. trachomatis RNA, TMA: NOT DETECTED
N. gonorrhoeae RNA, TMA: NOT DETECTED

## 2024-01-29 LAB — URINE CULTURE
MICRO NUMBER:: 17182443
MICRO NUMBER:: 17182453
SPECIMEN QUALITY:: ADEQUATE
SPECIMEN QUALITY:: ADEQUATE

## 2024-01-30 ENCOUNTER — Ambulatory Visit: Payer: Self-pay | Admitting: Family Medicine

## 2024-01-31 ENCOUNTER — Ambulatory Visit (INDEPENDENT_AMBULATORY_CARE_PROVIDER_SITE_OTHER): Admitting: Neurology

## 2024-01-31 DIAGNOSIS — G4733 Obstructive sleep apnea (adult) (pediatric): Secondary | ICD-10-CM | POA: Diagnosis not present

## 2024-02-06 NOTE — Progress Notes (Unsigned)
 SABRA

## 2024-02-07 NOTE — Procedures (Signed)
 [facility]  HOME SLEEP TEST (SANSA) REPORT (Mail-Out Device):   STUDY DATE: 01/31/2024  DOB: July 01, 1965  MRN: [id number]  ORDERING CLINICIAN: True Mar, MD, PhD   REFERRING CLINICIAN: Greig Forbes, NP  CLINICAL INFORMATION/HISTORY (obtained from visit note dated 02/26/2023): 58 year old female with an underlying medical history of irritable bowel syndrome, morbid obesity, hypertension, reflux disease as well as kidney stones, who presents for reevaluation of her obstructive sleep apnea.  She has been compliant with her CPAP of 8 cm with EPR of 3 with good apnea control and good tolerance of treatment.  She should be eligible for a new machine.    BMI (at the time of sleep clinic visit and/or test date): 49.4 kg/m  FINDINGS:   Study Protocol:    The SANSA single-point-of-skin-contact chest-worn sensor - an FDA cleared and DOT approved type 4 home sleep test device - measures eight physiological channels,  including blood oxygen saturation (measured via PPG [photoplethysmography]), EKG-derived heart rate, respiratory effort, chest movement (measured via accelerometer), snoring, body position, and actigraphy. The device is designed to be worn for up to 10 hours per study.   Sleep Summary:   Total Recording Time (hours, min): 9 hours, 17 min  Total Effective Sleep Time (hours, min):  6 hours, 7 min  Sleep Efficiency (%):    66%   Respiratory Indices:   Calculated sAHI (per hour):  16.3/hour         Oxygen Saturation Statistics:    Oxygen Saturation (%) Mean: 94.2%   Minimum oxygen saturation (%):                 75.5%   O2 Saturation Range (%): 75.5-100%   Time below or at 88% saturation: 6 min   Pulse Rate Statistics:   Pulse Mean (bpm):    84/min    Pulse Range (57-111/min)   Snoring: Mild to moderate  IMPRESSION/DIAGNOSES:   OSA (obstructive sleep apnea), moderate  RECOMMENDATIONS:   This home sleep test demonstrates moderate  obstructive sleep apnea with a total AHI of 16.3/hour and O2 nadir of 75.5%.  Mild to moderate snoring was detected.  Ongoing treatment with a positive airway pressure (PAP) device is recommended. The patient has been compliant with her CPAP of 8 cm with EPR of 3 with good apnea control and good tolerance of treatment, good mask seal.  She should be eligible for a new machine.  I would keep the settings the same, mask of choice, sized to fit.  Ongoing full compliance should be encouraged. A full night titration study may be considered to optimize treatment settings, monitor proper oxygen saturations and aid with improvement of tolerance and adherence, if needed down the road. Alternative treatment options may include a dental device through dentistry or orthodontics in selected patients or Inspire (hypoglossal nerve stimulator) in carefully selected patients (meeting inclusion criteria).  Concomitant weight loss is recommended (where clinically appropriate). Please note that untreated obstructive sleep apnea may carry additional perioperative morbidity. Patients with significant obstructive sleep apnea should receive perioperative PAP therapy and the surgeons and particularly the anesthesiologist should be informed of the diagnosis and the severity of the sleep disordered breathing. The patient should be cautioned not to drive, work at heights, or operate dangerous or heavy equipment when tired or sleepy. Review and reiteration of good sleep hygiene measures should be pursued with any patient. Other causes of the patient's symptoms, including circadian rhythm disturbances, an underlying mood disorder, medication effect and/or an underlying  medical problem cannot be ruled out based on this test. Clinical correlation is recommended.  The patient and her referring provider will be notified of the test results. The patient will be seen in follow up in sleep clinic at [facility].  I certify that I have reviewed the raw  data recording prior to the issuance of this report in accordance with the standards of the American Academy of Sleep Medicine (AASM).    INTERPRETING PHYSICIAN:   True Mar, MD, PhD Medical Director, [facility]) Diplomat, ABPN (Neurology and Sleep)   [facility] [address] [place], [place] [address] [phone]

## 2024-02-10 ENCOUNTER — Ambulatory Visit: Payer: Self-pay | Admitting: Family Medicine

## 2024-02-10 DIAGNOSIS — G4733 Obstructive sleep apnea (adult) (pediatric): Secondary | ICD-10-CM

## 2024-02-24 ENCOUNTER — Other Ambulatory Visit: Payer: Self-pay | Admitting: Internal Medicine

## 2024-03-07 ENCOUNTER — Other Ambulatory Visit: Payer: Self-pay | Admitting: Cardiology

## 2024-04-08 ENCOUNTER — Other Ambulatory Visit: Payer: Self-pay | Admitting: Family Medicine

## 2024-04-08 DIAGNOSIS — I10 Essential (primary) hypertension: Secondary | ICD-10-CM

## 2024-04-20 ENCOUNTER — Ambulatory Visit: Payer: Self-pay

## 2024-04-20 NOTE — Telephone Encounter (Signed)
" °  FYI Only or Action Required?: FYI only for provider: appointment scheduled on 01.27.26.  Patient was last seen in primary care on 01/27/2024 by Duanne Butler DASEN, MD.  Called Nurse Triage reporting Diarrhea.  Symptoms began several days ago.  Interventions attempted: OTC medications: Pepto.  Symptoms are: gradually worsening.  Triage Disposition: See Physician Within 24 Hours  Patient/caregiver understands and will follow disposition?: Yes  Message from [facility] G sent at 04/20/2024  1:23 PM EST  Reason for Triage: Patient has been experiencing Uncontrolled diarrhea and stomachache for 3 days now. Callback number  [phone]   Reason for Disposition  [1] MODERATE diarrhea (e.g., 4-6 times / day more than normal) AND [2] present > 48 hours (2 days)  Answer Assessment - Initial Assessment Questions 1. DIARRHEA SEVERITY: How bad is the diarrhea? How many more stools have you had in the past 24 hours than normal?  6 x in 24 hours       2. ONSET: When did the diarrhea begin?      X 3 days  3. STOOL DESCRIPTION:  How loose or watery is the diarrhea? What is the stool color? Is there any blood or mucous in the stool?     Watery- black  4. VOMITING: Are you also vomiting? If Yes, ask: How many times in the past 24 hours?       Denies  5. ABDOMEN PAIN: Are you having any abdomen pain? If Yes, ask: What does it feel like? (e.g., crampy, dull, intermittent, constant)       Cramping  6. ABDOMEN PAIN SEVERITY: If present, ask: How bad is the pain?  (e.g., Scale 1-10; mild, moderate, or severe)      3-4 /10  7. ORAL INTAKE: If vomiting, Have you been able to drink liquids? How much liquids have you had in the past 24 hours?      She is trying to stay hydrated  8. HYDRATION: Any signs of dehydration? (e.g., dry mouth [not just dry lips], too weak to stand, dizziness, new weight loss) When did you last urinate?      States she may be dehydrated. Denies  dizziness, weakness, decreased urination  9. EXPOSURE: Have you traveled to a foreign country recently? Have you been exposed to anyone with diarrhea? Could you have eaten any food that was spoiled?      Denies  10. ANTIBIOTIC USE: Are you taking antibiotics now or have you taken antibiotics in the past 2 months?        Denies  11. OTHER SYMPTOMS: Pt denies fever, blood in stool         Pt reports Diarrhea and abdominal cramps Pt is taking OTC pepto Pt advised to increase fluid intake (water/gatorade) and stop pepto due to stool discoloration and inability to check for blood in stool if pepto turns stool dark Pt scheduled for a visit on 01.27.26  for further evaluation. Pt agrees with plan of care, will call back for any worsening symptoms  Protocols used: Diarrhea-A-AH  "

## 2024-04-21 ENCOUNTER — Encounter: Payer: Self-pay | Admitting: Family Medicine

## 2024-04-21 ENCOUNTER — Ambulatory Visit: Admitting: Family Medicine

## 2024-04-21 VITALS — BP 122/70 | HR 75 | Temp 98.1°F | Ht 61.5 in | Wt 268.0 lb

## 2024-04-21 DIAGNOSIS — R051 Acute cough: Secondary | ICD-10-CM

## 2024-04-21 DIAGNOSIS — R197 Diarrhea, unspecified: Secondary | ICD-10-CM | POA: Diagnosis not present

## 2024-04-21 LAB — INFLUENZA A AND B AG, IMMUNOASSAY
INFLUENZA A ANTIGEN: NOT DETECTED
INFLUENZA B ANTIGEN: NOT DETECTED

## 2024-04-21 MED ORDER — HYDROCODONE BIT-HOMATROP MBR 5-1.5 MG/5ML PO SOLN: 5.0000 mL | Freq: Three times a day (TID) | ORAL | 0 refills | Status: AC | PRN

## 2024-04-21 NOTE — Progress Notes (Signed)
 "   Subjective:    Patient ID: Patricia [name], female    DOB: May 08, 1965, 59 y.o.   MRN: [id number] Patient states her symptoms began Saturday.  She reports severe diarrhea.  She is having 6-7 episodes a day of watery diarrhea.  She is having abdominal cramps.  She tried some Pepto-Bismol but this did not help.  She denies any nausea or vomiting.  She denies any fever or bodyaches.  She does have some rhinorrhea.  She is also having a cough that is nonproductive but is keeping her awake.  She denies any shortness of breath or chest pain or pleurisy  Past Medical History:  Diagnosis Date   Arthritis    spine-DDD-intermittent sciatic pain   Chronic cervicitis    resolved   Diabetes mellitus without complication (HCC)    diet controlled   GERD (gastroesophageal reflux disease)    Heart murmur    benign-routinely sees yearly - Dr. Ethel Ross, cardiology   History of kidney stones    multiple kidney stones-passed.   Hypertension    IBS (irritable bowel syndrome)    controlled with diet   Menorrhagia    resolved s/p Hysterectomy   Obesity    Sleep apnea    cpap used nightly   Vitamin D  deficiency    Past Surgical History:  Procedure Laterality Date   BREAST LUMPECTOMY Left    benign hamartoma   CESAREAN SECTION     x1   COLONOSCOPY  10/19/2005   ENDOMETRIAL ABLATION     ESOPHAGOGASTRODUODENOSCOPY  12/21/2008   ESOPHAGOGASTRODUODENOSCOPY (EGD) WITH PROPOFOL  N/A 10/08/2022   Procedure: ESOPHAGOGASTRODUODENOSCOPY (EGD) WITH PROPOFOL ;  Surgeon: Avram Lupita FORBES, MD;  Location: [facility];  Service: Gastroenterology;  Laterality: N/A;   HYSTEROSCOPY WITH D & C     resection of endometrial polyp   LAPAROSCOPIC GASTRIC SLEEVE RESECTION N/A 03/12/2016   Procedure: LAPAROSCOPIC GASTRIC SLEEVE RESECTION WITH UPPER ENDO;  Surgeon: Herlene Righter Kinsinger, MD;  Location: [facility];  Service: General;  Laterality: N/A;   TUBAL LIGATION     Uterine Polyps Removed     VAGINAL HYSTERECTOMY      fibroids   Current Outpatient Medications on File Prior to Visit  Medication Sig Dispense Refill   ALPRAZolam  (XANAX ) 0.5 MG tablet Take 1 tablet (0.5 mg total) by mouth at bedtime as needed for anxiety or sleep. 30 tablet 0   atorvastatin  (LIPITOR) 10 MG tablet TAKE 1 TABLET BY MOUTH EVERY DAY 90 tablet 3   fluticasone  (FLONASE ) 50 MCG/ACT nasal spray Place 2 sprays into both nostrils daily. 16 g 6   hyoscyamine  (LEVBID ) 0.375 MG 12 hr tablet TAKE 1 TABLET BY MOUTH 2 TIMES DAILY 60 tablet 1   losartan -hydrochlorothiazide  (HYZAAR) 100-12.5 MG tablet TAKE 1 TABLET BY MOUTH EVERY DAY 30 tablet 2   Multiple Vitamin (MULTIVITAMIN) capsule Take 1 capsule by mouth daily.     nitrofurantoin , macrocrystal-monohydrate, (MACROBID ) 100 MG capsule Take 1 capsule (100 mg total) by mouth 2 (two) times daily. 14 capsule 0   omeprazole  (PRILOSEC) 40 MG capsule Take 1 capsule (40 mg total) by mouth daily. 90 capsule 3   OVER THE COUNTER MEDICATION Take 1 capsule by mouth daily. Sea Moss Supplement     potassium chloride  SA (KLOR-CON  M) 20 MEQ tablet TAKE 1 TABLET BY MOUTH EVERY DAY 30 tablet 2   Probiotic Product (PROBIOTIC DAILY PO) Take 1 tablet by mouth daily.     tirzepatide  (ZEPBOUND ) 2.5 MG/0.5ML Pen Inject  2.5 mg into the skin once a week. (Patient not taking: Reported on 04/21/2024) 2 mL 0   No current facility-administered medications on file prior to visit.   No Known Allergies Social History   Socioeconomic History   Marital status: Widowed    Spouse name: Ronalee   Number of children: 2   Years of education: 16   Highest education level: Not on file  Occupational History   Occupation: QUALITY ANALYST    Employer: [employer]  Tobacco Use   Smoking status: Never   Smokeless tobacco: Never  Vaping Use   Vaping status: Never Used  Substance and Sexual Activity   Alcohol use: Yes    Alcohol/week: 0.0 standard drinks of alcohol    Comment: socially   Drug use: No   Sexual  activity: Yes    Birth control/protection: Surgical  Other Topics Concern   Not on file  Social History Narrative   Widowed-2022, college education, 2 children    Nurse, 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[employer]    patient is right handed and consumes green tea daily   Never smoker no tobacco, no drug use, only rare to occasional alcohol   Social Drivers of Health   Tobacco Use: Low Risk (04/21/2024)   Patient History    Smoking Tobacco Use: Never    Smokeless Tobacco Use: Never    Passive Exposure: Not on file  Financial Resource Strain: Not on file  Food Insecurity: Not on file  Transportation Needs: Not on file  Physical Activity: Not on file  Stress: Not on file  Social Connections: Not on file  Intimate Partner Violence: Not on file  Depression (PHQ2-9): Low Risk (07/02/2023)   Depression (PHQ2-9)    PHQ-2 Score: 0  Alcohol Screen: Not on file  Housing: Not on file  Utilities: Not on file  Health Literacy: Not on file   Family History  Problem Relation Age of Onset   Hypertension Mother    Diabetes Mother    Coronary artery disease Mother    Sudden death Mother    Hypertension Father    Cancer Brother        carcinoid tumer of the appendix   Colon cancer Neg Hx    Esophageal cancer Neg Hx    Stomach cancer Neg Hx    Rectal cancer Neg Hx    Colon polyps Neg Hx       Review of Systems  All other systems reviewed and are negative.      Objective:   Physical Exam Vitals reviewed.  Constitutional:      General: She is not in acute distress.    Appearance: Normal appearance. She is well-developed. She is not ill-appearing, toxic-appearing or diaphoretic.  HENT:     Head: Normocephalic and atraumatic.     Right Ear: Tympanic membrane and ear canal normal.     Left Ear: Tympanic membrane and ear canal normal.     Nose: Rhinorrhea present. No congestion.     Mouth/Throat:     Pharynx: Oropharynx is clear. No oropharyngeal exudate or posterior oropharyngeal erythema.   Eyes:     General: No scleral icterus.       Right eye: No discharge.        Left eye: No discharge.     Conjunctiva/sclera: Conjunctivae normal.     Pupils: Pupils are equal, round, and reactive to light.  Neck:     Thyroid : No thyromegaly.     Vascular: No JVD.  Trachea: No tracheal deviation.  Cardiovascular:     Rate and Rhythm: Normal rate and regular rhythm.     Heart sounds: Normal heart sounds. No murmur heard.    No friction rub. No gallop.  Pulmonary:     Effort: Pulmonary effort is normal. No respiratory distress.     Breath sounds: Normal breath sounds. No stridor. No wheezing or rales.  Chest:     Chest wall: No tenderness.  Abdominal:     General: Bowel sounds are normal. There is no distension.     Palpations: Abdomen is soft. There is no mass.     Tenderness: There is no abdominal tenderness. There is no guarding or rebound.     Hernia: No hernia is present.  Musculoskeletal:     Cervical back: Normal range of motion and neck supple.  Lymphadenopathy:     Cervical: No cervical adenopathy.  Skin:    General: Skin is warm.     Coloration: Skin is not pale.     Findings: No erythema or rash. Rash is not macular, papular, pustular, scaling, urticarial or vesicular.  Neurological:     Mental Status: She is alert and oriented to person, place, and time.     Cranial Nerves: No cranial nerve deficit.     Sensory: No sensory deficit.     Motor: No abnormal muscle tone.     Coordination: Coordination normal.     Deep Tendon Reflexes: Reflexes normal.  Psychiatric:        Behavior: Behavior normal.        Thought Content: Thought content normal.           Assessment & Plan:  Diarrhea, unspecified type - Plan: Influenza A and B Ag, Immunoassay  Acute cough - Plan: Influenza A and B Ag, Immunoassay Patient has a viral syndrome.  Recommended tincture of time.  Push fluids such as Gatorade.  Consume a BRAT diet.  Treat symptoms such as fever and bodyaches  with Tylenol .  Use Imodium 1 tablet after each loose stool up to 6 tablets/day to treat diarrhea.  She can use Hycodan 1 teaspoon every 6 hours as needed for cough. "

## 2024-07-02 ENCOUNTER — Encounter: Admitting: Family Medicine
